# Patient Record
Sex: Male | Born: 1965 | Race: White | Hispanic: No | Marital: Married | Smoking: Current every day smoker
Health system: Southern US, Community
[De-identification: ages and names within clinical notes are randomized; demographics above are authoritative.]

## PROBLEM LIST (undated history)

## (undated) DIAGNOSIS — I10 Essential (primary) hypertension: Secondary | ICD-10-CM

## (undated) DIAGNOSIS — S37009A Unspecified injury of unspecified kidney, initial encounter: Secondary | ICD-10-CM

## (undated) DIAGNOSIS — N289 Disorder of kidney and ureter, unspecified: Secondary | ICD-10-CM

## (undated) DIAGNOSIS — F32A Depression, unspecified: Secondary | ICD-10-CM

## (undated) DIAGNOSIS — G4730 Sleep apnea, unspecified: Secondary | ICD-10-CM

## (undated) DIAGNOSIS — E669 Obesity, unspecified: Secondary | ICD-10-CM

## (undated) DIAGNOSIS — N200 Calculus of kidney: Secondary | ICD-10-CM

## (undated) DIAGNOSIS — M1990 Unspecified osteoarthritis, unspecified site: Secondary | ICD-10-CM

## (undated) DIAGNOSIS — E7800 Pure hypercholesterolemia, unspecified: Secondary | ICD-10-CM

## (undated) DIAGNOSIS — F329 Major depressive disorder, single episode, unspecified: Secondary | ICD-10-CM

## (undated) DIAGNOSIS — M109 Gout, unspecified: Secondary | ICD-10-CM

## (undated) HISTORY — DX: Sleep apnea, unspecified: G47.30

## (undated) HISTORY — DX: Depression, unspecified: F32.A

## (undated) HISTORY — DX: Essential (primary) hypertension: I10

## (undated) HISTORY — DX: Pure hypercholesterolemia, unspecified: E78.00

## (undated) HISTORY — DX: Calculus of kidney: N20.0

## (undated) HISTORY — DX: Gout, unspecified: M10.9

## (undated) HISTORY — DX: Major depressive disorder, single episode, unspecified: F32.9

---
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
## 1989-02-24 HISTORY — PX: FOOT SURGERY: SHX648

## 1997-11-08 ENCOUNTER — Emergency Department (HOSPITAL_COMMUNITY): Admission: EM | Admit: 1997-11-08 | Discharge: 1997-11-08 | Payer: Self-pay | Admitting: Emergency Medicine

## 1998-07-10 ENCOUNTER — Emergency Department (HOSPITAL_COMMUNITY): Admission: EM | Admit: 1998-07-10 | Discharge: 1998-07-10 | Payer: Self-pay | Admitting: Family Medicine

## 2000-10-14 ENCOUNTER — Ambulatory Visit (HOSPITAL_COMMUNITY): Admission: RE | Admit: 2000-10-14 | Discharge: 2000-10-14 | Payer: Self-pay | Admitting: Urology

## 2001-06-25 ENCOUNTER — Ambulatory Visit (HOSPITAL_BASED_OUTPATIENT_CLINIC_OR_DEPARTMENT_OTHER): Admission: RE | Admit: 2001-06-25 | Discharge: 2001-06-25 | Payer: Self-pay | Admitting: Family Medicine

## 2004-04-06 ENCOUNTER — Emergency Department (HOSPITAL_COMMUNITY): Admission: EM | Admit: 2004-04-06 | Discharge: 2004-04-07 | Payer: Self-pay | Admitting: *Deleted

## 2004-09-30 ENCOUNTER — Emergency Department (HOSPITAL_COMMUNITY): Admission: EM | Admit: 2004-09-30 | Discharge: 2004-10-01 | Payer: Self-pay | Admitting: Emergency Medicine

## 2005-12-04 ENCOUNTER — Emergency Department (HOSPITAL_COMMUNITY): Admission: EM | Admit: 2005-12-04 | Discharge: 2005-12-04 | Payer: Self-pay | Admitting: Emergency Medicine

## 2006-06-26 ENCOUNTER — Ambulatory Visit: Payer: Self-pay | Admitting: Internal Medicine

## 2006-06-30 ENCOUNTER — Ambulatory Visit: Payer: Self-pay | Admitting: Internal Medicine

## 2006-07-13 ENCOUNTER — Ambulatory Visit: Payer: Self-pay | Admitting: Internal Medicine

## 2006-08-21 ENCOUNTER — Ambulatory Visit: Payer: Self-pay | Admitting: Internal Medicine

## 2006-09-04 ENCOUNTER — Ambulatory Visit: Payer: Self-pay | Admitting: Internal Medicine

## 2006-09-04 LAB — CONVERTED CEMR LAB
ALT: 43 units/L (ref 0–53)
AST: 29 units/L (ref 0–37)
Albumin: 4 g/dL (ref 3.5–5.2)
Alkaline Phosphatase: 62 units/L (ref 39–117)
BUN: 12 mg/dL (ref 6–23)
Basophils Absolute: 0 10*3/uL (ref 0.0–0.1)
Basophils Relative: 0.2 % (ref 0.0–1.0)
Bilirubin Urine: NEGATIVE
Bilirubin, Direct: 0.1 mg/dL (ref 0.0–0.3)
CO2: 25 meq/L (ref 19–32)
Calcium: 9.3 mg/dL (ref 8.4–10.5)
Chloride: 102 meq/L (ref 96–112)
Cholesterol: 172 mg/dL (ref 0–200)
Creatinine, Ser: 1.4 mg/dL (ref 0.4–1.5)
Direct LDL: 129.5 mg/dL
Eosinophils Absolute: 0.6 10*3/uL (ref 0.0–0.6)
Eosinophils Relative: 7 % — ABNORMAL HIGH (ref 0.0–5.0)
GFR calc Af Amer: 71 mL/min
GFR calc non Af Amer: 59 mL/min
Glucose, Bld: 114 mg/dL — ABNORMAL HIGH (ref 70–99)
HCT: 45.2 % (ref 39.0–52.0)
HDL: 21.6 mg/dL — ABNORMAL LOW (ref >39.0)
Hemoglobin, Urine: NEGATIVE
Hemoglobin: 15.5 g/dL (ref 13.0–17.0)
Hgb A1c MFr Bld: 6.2 % — ABNORMAL HIGH (ref 4.6–6.0)
Ketones, ur: NEGATIVE mg/dL
Leukocytes, UA: NEGATIVE
Lymphocytes Relative: 27.6 % (ref 12.0–46.0)
MCHC: 34.4 g/dL (ref 30.0–36.0)
MCV: 88.8 fL (ref 78.0–100.0)
Monocytes Absolute: 0.9 10*3/uL — ABNORMAL HIGH (ref 0.2–0.7)
Monocytes Relative: 9.7 % (ref 3.0–11.0)
Neutro Abs: 5.1 10*3/uL (ref 1.4–7.7)
Neutrophils Relative %: 55.5 % (ref 43.0–77.0)
Nitrite: NEGATIVE
Platelets: 280 10*3/uL (ref 150–400)
Potassium: 3.8 meq/L (ref 3.5–5.1)
RBC: 5.09 M/uL (ref 4.22–5.81)
RDW: 11.9 % (ref 11.5–14.6)
Sodium: 137 meq/L (ref 135–145)
Specific Gravity, Urine: 1.02 (ref 1.000–1.03)
TSH: 2.34 microintl units/mL (ref 0.35–5.50)
Total Bilirubin: 0.6 mg/dL (ref 0.3–1.2)
Total CHOL/HDL Ratio: 8
Total Protein, Urine: NEGATIVE mg/dL
Total Protein: 6.8 g/dL (ref 6.0–8.3)
Triglycerides: 234 mg/dL (ref 0–149)
Urine Glucose: NEGATIVE mg/dL
Urobilinogen, UA: 0.2 (ref 0.0–1.0)
VLDL: 47 mg/dL — ABNORMAL HIGH (ref 0–40)
WBC: 9.1 10*3/uL (ref 4.5–10.5)
pH: 5.5 (ref 5.0–8.0)

## 2006-09-14 ENCOUNTER — Ambulatory Visit (HOSPITAL_BASED_OUTPATIENT_CLINIC_OR_DEPARTMENT_OTHER): Admission: RE | Admit: 2006-09-14 | Discharge: 2006-09-14 | Payer: Self-pay | Admitting: Internal Medicine

## 2006-10-04 ENCOUNTER — Emergency Department (HOSPITAL_COMMUNITY): Admission: EM | Admit: 2006-10-04 | Discharge: 2006-10-04 | Payer: Self-pay | Admitting: Emergency Medicine

## 2006-10-07 ENCOUNTER — Ambulatory Visit: Payer: Self-pay | Admitting: Pulmonary Disease

## 2006-10-16 ENCOUNTER — Ambulatory Visit: Payer: Self-pay | Admitting: Internal Medicine

## 2006-10-16 LAB — CONVERTED CEMR LAB
Bacteria, UA: NEGATIVE
Bilirubin Urine: NEGATIVE
Crystals: NEGATIVE
Hemoglobin, Urine: NEGATIVE
Ketones, ur: NEGATIVE mg/dL
Leukocytes, UA: NEGATIVE
Nitrite: NEGATIVE
RBC / HPF: NONE SEEN
Specific Gravity, Urine: 1.025 (ref 1.000–1.03)
Squamous Epithelial / LPF: NEGATIVE /lpf
Total Protein, Urine: NEGATIVE mg/dL
Urine Glucose: NEGATIVE mg/dL
Urobilinogen, UA: 0.2 (ref 0.0–1.0)
pH: 5.5 (ref 5.0–8.0)

## 2006-11-27 ENCOUNTER — Ambulatory Visit: Payer: Self-pay | Admitting: Internal Medicine

## 2006-11-27 LAB — CONVERTED CEMR LAB
BUN: 10 mg/dL (ref 6–23)
CO2: 28 meq/L (ref 19–32)
Calcium: 9.5 mg/dL (ref 8.4–10.5)
Chloride: 102 meq/L (ref 96–112)
Creatinine, Ser: 1.2 mg/dL (ref 0.4–1.5)
GFR calc Af Amer: 85 mL/min
GFR calc non Af Amer: 71 mL/min
Glucose, Bld: 106 mg/dL — ABNORMAL HIGH (ref 70–99)
Hgb A1c MFr Bld: 6.3 % — ABNORMAL HIGH (ref 4.6–6.0)
Potassium: 4 meq/L (ref 3.5–5.1)
Sodium: 137 meq/L (ref 135–145)

## 2006-12-23 DIAGNOSIS — B351 Tinea unguium: Secondary | ICD-10-CM | POA: Insufficient documentation

## 2006-12-23 DIAGNOSIS — F17200 Nicotine dependence, unspecified, uncomplicated: Secondary | ICD-10-CM | POA: Insufficient documentation

## 2006-12-23 DIAGNOSIS — Z87442 Personal history of urinary calculi: Secondary | ICD-10-CM | POA: Insufficient documentation

## 2006-12-23 DIAGNOSIS — G5600 Carpal tunnel syndrome, unspecified upper limb: Secondary | ICD-10-CM | POA: Insufficient documentation

## 2006-12-23 DIAGNOSIS — E785 Hyperlipidemia, unspecified: Secondary | ICD-10-CM | POA: Insufficient documentation

## 2006-12-23 DIAGNOSIS — I10 Essential (primary) hypertension: Secondary | ICD-10-CM | POA: Insufficient documentation

## 2006-12-23 DIAGNOSIS — F411 Generalized anxiety disorder: Secondary | ICD-10-CM | POA: Insufficient documentation

## 2007-02-18 ENCOUNTER — Emergency Department (HOSPITAL_COMMUNITY): Admission: EM | Admit: 2007-02-18 | Discharge: 2007-02-18 | Payer: Self-pay | Admitting: Emergency Medicine

## 2008-11-23 ENCOUNTER — Emergency Department (HOSPITAL_COMMUNITY): Admission: EM | Admit: 2008-11-23 | Discharge: 2008-11-23 | Payer: Self-pay | Admitting: Emergency Medicine

## 2009-03-09 ENCOUNTER — Emergency Department (HOSPITAL_COMMUNITY): Admission: EM | Admit: 2009-03-09 | Discharge: 2009-03-09 | Payer: Self-pay | Admitting: Emergency Medicine

## 2009-04-09 ENCOUNTER — Inpatient Hospital Stay (HOSPITAL_COMMUNITY): Admission: RE | Admit: 2009-04-09 | Discharge: 2009-04-11 | Payer: Self-pay | Admitting: Psychiatry

## 2009-04-09 ENCOUNTER — Ambulatory Visit: Payer: Self-pay | Admitting: Psychiatry

## 2009-04-16 ENCOUNTER — Other Ambulatory Visit (HOSPITAL_COMMUNITY): Admission: RE | Admit: 2009-04-16 | Discharge: 2009-04-27 | Payer: Self-pay | Admitting: Psychiatry

## 2009-04-16 ENCOUNTER — Ambulatory Visit: Payer: Self-pay | Admitting: Psychiatry

## 2009-05-07 ENCOUNTER — Ambulatory Visit (HOSPITAL_COMMUNITY): Payer: Self-pay | Admitting: Psychiatry

## 2009-05-14 ENCOUNTER — Ambulatory Visit (HOSPITAL_COMMUNITY): Payer: Self-pay | Admitting: Marriage and Family Therapist

## 2009-05-21 ENCOUNTER — Ambulatory Visit (HOSPITAL_COMMUNITY): Payer: Self-pay | Admitting: Psychiatry

## 2009-05-23 ENCOUNTER — Ambulatory Visit (HOSPITAL_COMMUNITY): Payer: Self-pay | Admitting: Marriage and Family Therapist

## 2009-05-29 ENCOUNTER — Ambulatory Visit (HOSPITAL_COMMUNITY): Payer: Self-pay | Admitting: Marriage and Family Therapist

## 2009-06-07 ENCOUNTER — Ambulatory Visit (HOSPITAL_COMMUNITY): Payer: Self-pay | Admitting: Marriage and Family Therapist

## 2009-06-18 ENCOUNTER — Ambulatory Visit (HOSPITAL_COMMUNITY): Payer: Self-pay | Admitting: Marriage and Family Therapist

## 2009-06-22 ENCOUNTER — Ambulatory Visit (HOSPITAL_COMMUNITY): Payer: Self-pay | Admitting: Psychiatry

## 2009-07-02 ENCOUNTER — Ambulatory Visit (HOSPITAL_COMMUNITY): Payer: Self-pay | Admitting: Marriage and Family Therapist

## 2009-07-09 ENCOUNTER — Ambulatory Visit (HOSPITAL_COMMUNITY): Payer: Self-pay | Admitting: Marriage and Family Therapist

## 2009-07-24 ENCOUNTER — Ambulatory Visit (HOSPITAL_COMMUNITY): Payer: Self-pay | Admitting: Marriage and Family Therapist

## 2009-08-09 ENCOUNTER — Ambulatory Visit (HOSPITAL_COMMUNITY): Payer: Self-pay | Admitting: Marriage and Family Therapist

## 2009-08-17 ENCOUNTER — Ambulatory Visit (HOSPITAL_COMMUNITY): Payer: Self-pay | Admitting: Psychiatry

## 2009-08-30 ENCOUNTER — Ambulatory Visit (HOSPITAL_COMMUNITY): Payer: Self-pay | Admitting: Marriage and Family Therapist

## 2009-10-12 ENCOUNTER — Ambulatory Visit (HOSPITAL_COMMUNITY): Payer: Self-pay | Admitting: Psychiatry

## 2009-12-14 ENCOUNTER — Ambulatory Visit (HOSPITAL_COMMUNITY): Payer: Self-pay | Admitting: Psychiatry

## 2010-05-11 LAB — CBC
HCT: 40.9 % (ref 39.0–52.0)
Hemoglobin: 13.9 g/dL (ref 13.0–17.0)
MCHC: 34 g/dL (ref 30.0–36.0)
MCV: 96.3 fL (ref 78.0–100.0)
Platelets: 236 10*3/uL (ref 150–400)
RBC: 4.24 MIL/uL (ref 4.22–5.81)
RDW: 14.3 % (ref 11.5–15.5)
WBC: 7.1 10*3/uL (ref 4.0–10.5)

## 2010-05-11 LAB — DIFFERENTIAL
Basophils Absolute: 0 10*3/uL (ref 0.0–0.1)
Basophils Relative: 0 % (ref 0–1)
Eosinophils Absolute: 0 10*3/uL (ref 0.0–0.7)
Eosinophils Relative: 0 % (ref 0–5)
Lymphocytes Relative: 15 % (ref 12–46)
Lymphs Abs: 1.1 10*3/uL (ref 0.7–4.0)
Monocytes Absolute: 0.4 10*3/uL (ref 0.1–1.0)
Monocytes Relative: 5 % (ref 3–12)
Neutro Abs: 5.6 10*3/uL (ref 1.7–7.7)
Neutrophils Relative %: 79 % — ABNORMAL HIGH (ref 43–77)

## 2010-05-11 LAB — BASIC METABOLIC PANEL
BUN: 10 mg/dL (ref 6–23)
CO2: 25 mEq/L (ref 19–32)
Calcium: 9.6 mg/dL (ref 8.4–10.5)
Chloride: 106 mEq/L (ref 96–112)
Creatinine, Ser: 1.26 mg/dL (ref 0.4–1.5)
GFR calc Af Amer: >60 mL/min (ref >60)
GFR calc non Af Amer: >60 mL/min (ref >60)
Glucose, Bld: 123 mg/dL — ABNORMAL HIGH (ref 70–99)
Potassium: 4.1 mEq/L (ref 3.5–5.1)
Sodium: 139 mEq/L (ref 135–145)

## 2010-05-11 LAB — CK TOTAL AND CKMB (NOT AT ARMC)
CK, MB: 1.2 ng/mL (ref 0.3–4.0)
Relative Index: 0.5 (ref 0.0–2.5)
Total CK: 243 U/L — ABNORMAL HIGH (ref 7–232)

## 2010-05-11 LAB — TROPONIN I: Troponin I: 0.02 ng/mL (ref 0.00–0.06)

## 2010-05-16 LAB — COMPREHENSIVE METABOLIC PANEL
ALT: 27 U/L (ref 0–53)
AST: 21 U/L (ref 0–37)
Albumin: 3.9 g/dL (ref 3.5–5.2)
Alkaline Phosphatase: 47 U/L (ref 39–117)
BUN: 11 mg/dL (ref 6–23)
CO2: 26 mEq/L (ref 19–32)
Calcium: 9.4 mg/dL (ref 8.4–10.5)
Chloride: 102 mEq/L (ref 96–112)
Creatinine, Ser: 1.48 mg/dL (ref 0.4–1.5)
GFR calc Af Amer: >60 mL/min (ref >60)
GFR calc non Af Amer: 51 mL/min — ABNORMAL LOW (ref >60)
Glucose, Bld: 149 mg/dL — ABNORMAL HIGH (ref 70–99)
Potassium: 3.4 mEq/L — ABNORMAL LOW (ref 3.5–5.1)
Sodium: 137 mEq/L (ref 135–145)
Total Bilirubin: 0.5 mg/dL (ref 0.3–1.2)
Total Protein: 7.2 g/dL (ref 6.0–8.3)

## 2010-05-16 LAB — URINALYSIS, MICROSCOPIC ONLY
Bilirubin Urine: NEGATIVE
Glucose, UA: NEGATIVE mg/dL
Hgb urine dipstick: NEGATIVE
Leukocytes, UA: NEGATIVE
Nitrite: NEGATIVE
Protein, ur: NEGATIVE mg/dL
Specific Gravity, Urine: 1.019 (ref 1.005–1.030)
Urobilinogen, UA: 1 mg/dL (ref 0.0–1.0)
pH: 5.5 (ref 5.0–8.0)

## 2010-05-16 LAB — DRUGS OF ABUSE SCREEN W/O ALC, ROUTINE URINE
Amphetamine Screen, Ur: NEGATIVE
Barbiturate Quant, Ur: NEGATIVE
Benzodiazepines.: NEGATIVE
Cocaine Metabolites: NEGATIVE
Creatinine,U: 266.7 mg/dL
Marijuana Metabolite: NEGATIVE
Methadone: NEGATIVE
Opiate Screen, Urine: NEGATIVE
Phencyclidine (PCP): NEGATIVE
Propoxyphene: NEGATIVE

## 2010-05-16 LAB — CBC
HCT: 42.4 % (ref 39.0–52.0)
Hemoglobin: 14.8 g/dL (ref 13.0–17.0)
MCHC: 34.9 g/dL (ref 30.0–36.0)
MCV: 95 fL (ref 78.0–100.0)
Platelets: 241 10*3/uL (ref 150–400)
RBC: 4.46 MIL/uL (ref 4.22–5.81)
RDW: 13.6 % (ref 11.5–15.5)
WBC: 11.9 10*3/uL — ABNORMAL HIGH (ref 4.0–10.5)

## 2010-05-16 LAB — TSH: TSH: 1.762 u[IU]/mL (ref 0.350–4.500)

## 2010-05-31 LAB — BASIC METABOLIC PANEL
BUN: 14 mg/dL (ref 6–23)
CO2: 28 mEq/L (ref 19–32)
Calcium: 9.3 mg/dL (ref 8.4–10.5)
Chloride: 98 mEq/L (ref 96–112)
Creatinine, Ser: 1.47 mg/dL (ref 0.4–1.5)
GFR calc Af Amer: >60 mL/min (ref >60)
GFR calc non Af Amer: 52 mL/min — ABNORMAL LOW (ref >60)
Glucose, Bld: 107 mg/dL — ABNORMAL HIGH (ref 70–99)
Potassium: 3.5 mEq/L (ref 3.5–5.1)
Sodium: 136 mEq/L (ref 135–145)

## 2010-05-31 LAB — DIFFERENTIAL
Basophils Absolute: 0 10*3/uL (ref 0.0–0.1)
Basophils Relative: 0 % (ref 0–1)
Eosinophils Absolute: 0 10*3/uL (ref 0.0–0.7)
Eosinophils Relative: 0 % (ref 0–5)
Lymphocytes Relative: 31 % (ref 12–46)
Lymphs Abs: 3.1 10*3/uL (ref 0.7–4.0)
Monocytes Absolute: 1.1 10*3/uL — ABNORMAL HIGH (ref 0.1–1.0)
Monocytes Relative: 11 % (ref 3–12)
Neutro Abs: 6 10*3/uL (ref 1.7–7.7)
Neutrophils Relative %: 58 % (ref 43–77)

## 2010-05-31 LAB — URINALYSIS, ROUTINE W REFLEX MICROSCOPIC
Bilirubin Urine: NEGATIVE
Glucose, UA: NEGATIVE mg/dL
Hgb urine dipstick: NEGATIVE
Ketones, ur: NEGATIVE mg/dL
Nitrite: NEGATIVE
Protein, ur: NEGATIVE mg/dL
Specific Gravity, Urine: 1.014 (ref 1.005–1.030)
Urobilinogen, UA: 0.2 mg/dL (ref 0.0–1.0)
pH: 5.5 (ref 5.0–8.0)

## 2010-05-31 LAB — CBC
HCT: 41 % (ref 39.0–52.0)
Hemoglobin: 14.1 g/dL (ref 13.0–17.0)
MCHC: 34.4 g/dL (ref 30.0–36.0)
MCV: 96.2 fL (ref 78.0–100.0)
Platelets: 248 10*3/uL (ref 150–400)
RBC: 4.26 MIL/uL (ref 4.22–5.81)
RDW: 14.3 % (ref 11.5–15.5)
WBC: 10.3 10*3/uL (ref 4.0–10.5)

## 2010-05-31 LAB — CK TOTAL AND CKMB (NOT AT ARMC)
CK, MB: 1.5 ng/mL (ref 0.3–4.0)
Relative Index: 0.6 (ref 0.0–2.5)
Total CK: 246 U/L — ABNORMAL HIGH (ref 7–232)

## 2010-05-31 LAB — TROPONIN I: Troponin I: 0.01 ng/mL (ref 0.00–0.06)

## 2010-07-09 NOTE — Procedures (Signed)
NAME:  James Downs, James Downs NO.:  [id number]   MEDICAL RECORD NO.:  [id number]          PATIENT TYPE:  OUT   LOCATION:  SLEEP CENTER                 FACILITY:  [facility]   PHYSICIAN:  Barbaraann Share, MD,FCCPDATE OF BIRTH:  Dec 28[birth year] 1964   DATE OF STUDY:  09/14/2006                            NOCTURNAL POLYSOMNOGRAM   REFERRING PHYSICIAN:  Barbette Hair. Artist Pais, DO   LOCATION:  Sleep lab.   INDICATION FOR STUDY:  Hypersomnia with sleep apnea.   EPWORTH SLEEPINESS SCORE:  9.   SLEEP ARCHITECTURE:  The patient had a total sleep time of 415 minutes  with very little slow wave sleep and adequate quantity of REM.  Sleep  onset latency was normal at 11 minutes, and REM onset was prolonged at  176 minutes.  Sleep efficiency was excellent at 95%.   RESPIRATORY DATA:  The patient underwent split night study where he was  found to have 138 obstructive events in the first 125 minutes of sleep.  This gave him an apnea-hypopnea index of 67 events per hour over the  first half of the night.  The events occurred in all body positions, and  there was very loud snoring noted throughout.  By protocol the patient  was then placed on a petit ComfortGel nasal CPAP mask, and ultimately  titrated to a final pressure of 13 cm water pressure with excellent  control of events and also REM rebound.   OXYGEN DATA:  There was significant O2 desaturation as low as 53% due to  the patient's obstructive events prior to CPAP initiation.  The patient  maintained excellent sats once optimal CPAP was obtained.   CARDIAC DATA:  Occasional PVC was noted, but no clinically significant  cardiac arrhythmia.   MOVEMENT-PARASOMNIA:  None.   IMPRESSIONS/RECOMMENDATIONS:  1. Split night study reveals severe obstructive sleep apnea with an      apnea-hypopnea index of 67 events per hour and O2 desaturation as      low as 53% over the first half of the night.  The patient was then      placed on a petit ComfortGel  nasal mask, and ultimately titrated to      a final pressure of 13 cm of water with excellent control of      his events.  2. Occasional PVCs noted but no clinically significant arrhythmia      during the night.      Barbaraann Share, MD,FCCP  Diplomate, American Board of Sleep  Medicine  Electronically Signed     [facility]/MEDQ  D:  09/25/2006 17:59:45  T:  09/26/2006 12:51:43  Job:  [id number]   cc:   Barbette Hair. [place], DO  [address] [place], [place] [address]

## 2010-07-12 NOTE — Assessment & Plan Note (Signed)
[facility]                           PRIMARY CARE OFFICE NOTE   HANNAN, [name]             MRN:          [id number]  DATE:06/26/2006                            DOB:          1965/09/22    HISTORY AND PHYSICAL:   CHIEF COMPLAINT:  New patient to practice.   HISTORY OF PRESENT ILLNESS:  Patient is a 45 year old white male, here  to establish primary care.  He has been followed by Dr. Merla Riches in the  past.  His main complaint today has been right-heel pain, some heel pain  on the left.  This has been ongoing for several months.  He reports pain  towards the middle/front part of his heel.  He also complains of  thickened, yellow toenails, wonders about taking antifungal medication.   He has been followed by Dr. Merla Riches for high blood pressure for two or  three years.  He is on multiple agents, including Micardis,  hydrochlorothiazide and metoprolol ER.  He ran out of medications  approximately four days ago, has not taken any of his blood pressure  medicines.  He does not report any symptoms, although he is quite  tachycardic today with a heart rate of approximately 129.   He was also noted to have elevated cholesterol readings and, in December  of 2007, was started on pravastatin 20 mg.  He denies any adverse  effects.  He is tolerating it well.  He has not had repeat lipids or  LFTs since Pravastatin initiation.   He takes Paroxetine 20 mg once a day for anxiety/depression.  He had  issues with the stress at home.  He has two teenage boys that are a  source of frustration.  He denies any history of heart disease, denies  any history of type 2 diabetes.  He also is a long-time smoker, greater  than 23 pack-years.   PAST MEDICAL HISTORY SUMMARY:  1. Hypertension.  2. Hyperlipidemia.  3. Anxiety/depression.  4. Allergic rhinitis.  5. History of kidney stones, status post extraction.  6. History of carpal tunnel syndrome,  1991.  7. Status post carpal tunnel repair, 1986.   CURRENT MEDICATIONS:  1. Allegra D once a day.  2. Micardis 80 mg once a day.  3. Rhinocort Aqua 32 micrograms one spray each nostril a day.  4. Hydrochlorothiazide 25 mg once a day.  5. Metoprolol ER 100 mg once a day.  6. Pravastatin 20 mg at bedtime.  7. Paroxetine 20 mg once a day.   ALLERGIES TO MEDICATIONS:  None known.   Patient is married.  He works for [employer] in Set [employer].   FAMILY HISTORY:  Mother is age 11, has asthma, allergic rhinitis and  hypertension.  Father is alive at age 37, has prostate cancer, skin  cancer, hypertension, and questionable remote MI and question mini-  strokes.  Denies any family history of colon cancer.   HABITS:  No alcohol.  Tobacco use as noted above.   REVIEW OF SYSTEMS:  No fevers, chills.  Has chronic nasal congestion.  Denies any history of dyspnea or chest pain.  No heartburn, nausea or  vomiting,  constipation, diarrhea.  All other systems negative.   PHYSICAL EXAM:  VITALS:  Weight is 240 pounds, temperature is 97.6,  pulse is 129, BP is 135/88 in the left arm in seated position.  IN GENERAL:  The patient is a pleasant, morbidly obese, 45 year old  white male with a somewhat disheveled appearance and poor overall  hygiene.  HEENT:  Normocephalic, atraumatic.  Pupils were equal and reactive to  light bilaterally.  Extraocular motility was intact.  Patient was  anicteric.  Conjunctiva was within normal limits.  External auditory  canal and tympanic membranes were clear bilaterally.  Oropharyngeal exam  revealed upper and lower dental plates.  There was no evidence of  leukoplakia.  NECK EXAM:  Thickened.  Probable acanthosis nigricans.  No carotid bruit  or thyromegaly.  CHEST EXAM:  Normal respiratory effort.  Chest was clear to auscultation  bilaterally.  No rhonchi, rales or wheezing.  CARDIOVASCULAR:  Regular rate and rhythm, tachycardic.  No significant   murmurs, rubs or gallops appreciated.  ABDOMEN:  Protuberant, nontender.  Positive bowel sounds, no  organomegaly.  MUSCULOSKELETAL EXAM:  No clubbing, cyanosis or edema.  Patient has  thickened bilateral toenails.  He had palpable pedis dorsalis pulses and  point tenderness on the medial anterior portion of his calcaneus.  NEUROLOGIC:  Cranial nerves were grossly intact.  He was nonfocal and  his mood and affect were appropriate.   IMPRESSION/RECOMMENDATIONS:  1. Right-heel pain, probably secondary to plantar fasciitis.  2. Onychomycosis.  3. Ongoing tobacco abuse and probable COPD.  4. Hypertension.  5. Obesity, with probable type 2 diabetes.  6. Anxiety/depression.  7. Health maintenance.   RECOMMENDATIONS:  He is to wear shoes with high arch support and I also  reviewed exercises for plantar fasciitis.  If the patient's symptoms are  persistent after one month of regular exercise, then we will consider  orthopedic referral.   I discouraged/advised against, using Lamisil at this time, especially  with the patient taking statin medications and poor success rate for  totally eradicating onychomycosis.   In terms of his tobacco abuse, he was given a prescription for Chantix  and also instructional material.  He is to discontinue within six days  of starting medication.   In terms of his hypertension, I am somewhat surprised the patient's  blood pressure is so well-controlled on no blood pressure agents.  He is  to discontinue metoprolol and we can sample the Coreg CR 20 mg once a  day.  He will follow up in two or three weeks and we will make further  adjustments based on response.  He is to keep a log of at least ten  blood pressure readings before a followup visit.   I refilled the other medications and, instead of Allegra D, patient is  to take Zyrtec over-the-counter at bedtime and also continue Rhinocort  use.     Barbette Hair. Artist Pais, DO  Electronically  Signed   RDY/MedQ  DD: 06/26/2006  DT: 06/26/2006  Job #: [id number]

## 2010-07-12 NOTE — Op Note (Signed)
[facility]  Patient:    James Downs, James Downs Visit Number: [id number] MRN: [id number]          Service Type: DSU Location: DAY Attending Physician:  Lindaann Slough Proc. Date: 10/14/00 Adm. Date:  10/14/2000                             Operative Report  PREOPERATIVE DIAGNOSIS:  Left ureteral stone.  POSTOPERATIVE DIAGNOSES:  Left ureteral stone.  Trigonal cyst.  PROCEDURE DONE:  Cystoscopy, left retrograde pyelogram, ureteroscopy with stone extraction, insertion of left double-J catheter, rupture of trigonal cyst.  SURGEON:  Lindaann Slough, M.D.  ANESTHESIA:  General.  DESCRIPTION OF PROCEDURE:  Under general anesthesia, the patient was prepped and draped and placed in the dorsal lithotomy position.  A #22 Wappler cystoscope was inserted in the bladder.  The urethra is normal.  There is no stone or tumor in the bladder.  There is a cyst in the mid trigone.  The bladder mucosa is otherwise normal.  A cone-tip catheter was passed through the cystoscope and into the left ureter orifice.  Contrast was then injected through cone-tip catheter.  There was a filling defect in the distal ureter. The proximal ureter is normal.  The cone-tip catheter was then removed.  A guidewire was passed through the cystoscope into the left ureter.  The cystoscope was removed.  A 6.5 French rigid ureteroscope was then passed in the bladder and into the distal ureter.  A stone was visualized in the distal ureter.  The stone was then extracted with a stone basket.  The ureteroscope was then reinserted in the bladder, and there was no evidence of ureteral injury.  The ureteroscope was then removed.  The guidewire was then backloaded into the cystoscope, and a #6 [place] 26 double-J catheter was passed over the guidewire with the proximal end of the double-J in the collecting system and the distal end in the bladder.  Then, the trigonal mass was biopsied with  the biopsy forceps and found to be a cyst.  No tissue was obtained for biopsy, and the mass disappeared after the biopsy.  The base of the biopsy was then fulgurated with the Bugbee electrode.  The bladder was then emptied and the cystoscope removed.  The patient tolerated the procedure well and left the OR in satisfactory condition to postanesthesia care unit. Attending Physician:  Lindaann Slough DD:  10/14/00 TD:  10/15/00 Job: [id number]

## 2010-11-29 LAB — POCT CARDIAC MARKERS
CKMB, poc: 1.4
CKMB, poc: 1.7
Myoglobin, poc: 112
Myoglobin, poc: 84.6
Operator id: 3067
Operator id: 3067
Troponin i, poc: <0.05
Troponin i, poc: <0.05

## 2010-11-29 LAB — COMPREHENSIVE METABOLIC PANEL
ALT: 38
AST: 26
Albumin: 4
Alkaline Phosphatase: 57
BUN: 9
CO2: 24
Calcium: 9.2
Chloride: 109
Creatinine, Ser: 1.32
GFR calc Af Amer: >60
GFR calc non Af Amer: 59 — ABNORMAL LOW
Glucose, Bld: 135 — ABNORMAL HIGH
Potassium: 3.9
Sodium: 140
Total Bilirubin: 0.6
Total Protein: 7.4

## 2010-11-29 LAB — CBC
HCT: 43.5
Hemoglobin: 15.2
MCHC: 34.9
MCV: 87.8
Platelets: 268
RBC: 4.96
RDW: 12.7
WBC: 8.9

## 2010-11-29 LAB — AMYLASE: Amylase: 99

## 2010-11-29 LAB — DIFFERENTIAL
Basophils Absolute: 0
Basophils Relative: 0
Eosinophils Absolute: 0
Eosinophils Relative: 0
Lymphocytes Relative: 22
Lymphs Abs: 2
Monocytes Absolute: 0.7
Monocytes Relative: 8
Neutro Abs: 6.2
Neutrophils Relative %: 70

## 2010-11-29 LAB — LIPASE, BLOOD: Lipase: 31

## 2010-12-09 LAB — URINALYSIS, ROUTINE W REFLEX MICROSCOPIC
Bilirubin Urine: NEGATIVE
Glucose, UA: NEGATIVE
Hgb urine dipstick: NEGATIVE
Ketones, ur: NEGATIVE
Nitrite: NEGATIVE
Protein, ur: NEGATIVE
Specific Gravity, Urine: 1.016
Urobilinogen, UA: 0.2
pH: 5.5

## 2010-12-09 LAB — BASIC METABOLIC PANEL
BUN: 12
CO2: 29
Calcium: 9.2
Chloride: 104
Creatinine, Ser: 1.5
GFR calc Af Amer: >60
GFR calc non Af Amer: 51 — ABNORMAL LOW
Glucose, Bld: 108 — ABNORMAL HIGH
Potassium: 3.8
Sodium: 137

## 2010-12-09 LAB — CK: Total CK: 178

## 2012-11-04 ENCOUNTER — Other Ambulatory Visit: Payer: Self-pay | Admitting: Family Medicine

## 2012-11-04 ENCOUNTER — Ambulatory Visit
Admission: RE | Admit: 2012-11-04 | Discharge: 2012-11-04 | Disposition: A | Discharge status: Home / Self Care | Payer: No Typology Code available for payment source | Source: Ambulatory Visit | Attending: Family Medicine | Admitting: Family Medicine

## 2012-11-04 DIAGNOSIS — Z021 Encounter for pre-employment examination: Secondary | ICD-10-CM

## 2013-01-13 ENCOUNTER — Encounter: Payer: Self-pay | Admitting: Pulmonary Disease

## 2013-08-24 ENCOUNTER — Other Ambulatory Visit: Payer: Self-pay | Admitting: Occupational Medicine

## 2013-08-24 ENCOUNTER — Ambulatory Visit: Payer: Self-pay

## 2013-08-24 DIAGNOSIS — Z0000 Encounter for general adult medical examination without abnormal findings: Secondary | ICD-10-CM

## 2013-11-03 ENCOUNTER — Encounter: Payer: Self-pay | Admitting: *Deleted

## 2013-11-04 ENCOUNTER — Ambulatory Visit (INDEPENDENT_AMBULATORY_CARE_PROVIDER_SITE_OTHER): Payer: BC Managed Care – PPO | Admitting: Neurology

## 2013-11-04 ENCOUNTER — Encounter: Payer: Self-pay | Admitting: Neurology

## 2013-11-04 ENCOUNTER — Encounter (INDEPENDENT_AMBULATORY_CARE_PROVIDER_SITE_OTHER): Payer: Self-pay

## 2013-11-04 VITALS — BP 98/68 | HR 104 | Ht 68.5 in | Wt 238.0 lb

## 2013-11-04 DIAGNOSIS — R202 Paresthesia of skin: Secondary | ICD-10-CM

## 2013-11-04 DIAGNOSIS — IMO0002 Reserved for concepts with insufficient information to code with codable children: Secondary | ICD-10-CM

## 2013-11-04 DIAGNOSIS — R29898 Other symptoms and signs involving the musculoskeletal system: Secondary | ICD-10-CM | POA: Insufficient documentation

## 2013-11-04 DIAGNOSIS — R29818 Other symptoms and signs involving the nervous system: Secondary | ICD-10-CM

## 2013-11-04 DIAGNOSIS — M5136 Other intervertebral disc degeneration, lumbar region: Secondary | ICD-10-CM

## 2013-11-04 DIAGNOSIS — R209 Unspecified disturbances of skin sensation: Secondary | ICD-10-CM

## 2013-11-04 DIAGNOSIS — R208 Other disturbances of skin sensation: Secondary | ICD-10-CM

## 2013-11-04 DIAGNOSIS — M5137 Other intervertebral disc degeneration, lumbosacral region: Secondary | ICD-10-CM

## 2013-11-04 DIAGNOSIS — M5416 Radiculopathy, lumbar region: Secondary | ICD-10-CM | POA: Insufficient documentation

## 2013-11-04 NOTE — Patient Instructions (Signed)
Overall you are doing fairly well but I do want to suggest a few things today:   Remember to drink plenty of fluid, eat healthy meals and do not skip any meals. Try to eat protein with a every meal and eat a healthy snack such as fruit or nuts in between meals. Try to keep a regular sleep-wake schedule and try to exercise daily, particularly in the form of walking, 20-30 minutes a day, if you can.   As far as your medications are concerned, I would like to suggest continue neurontin.   As far as diagnostic testing: MRI brain and thoracic spine, bloodwork today  I would like to see you back in 3 months, sooner if we need to. Please call us with any interim questions, concerns, problems, updates or refill requests.   Please also call us for any test results so we can go over those with you on the phone.  My clinical assistant and will answer any of your questions and relay your messages to me and also relay most of my messages to you.   Our phone number is [phone]. We also have an after hours call service for urgent matters and there is a physician on-call for urgent questions. For any emergencies you know to call 911 or go to the nearest emergency room

## 2013-11-04 NOTE — Progress Notes (Signed)
[facility]    Provider:  Dr Lucia Gaskins Referring Provider: Meda Coffee, DO Primary Care Physician:  Thayer Headings, MD  CC:  Left leg weakness  HPI:  James Downs is a 48 y.o. male here as a referral from Dr. Artist Pais for left leg weakness. Symptoms started 4 months ago. In April  Started feeling numbness in the left abdomen radiating to the back in a band-like distribution. Numbness descended down the entire left leg. Now his right big toe feels numb but unclear if this is associated. Symptoms encompass the entire area from the left side belly button down the entire leg. He has a course of oral prednisone that made it better. Also gabapentin is making it better. He has also started having pain in the back and the back of his leg, gets cramps in the calf and thigh like a severe charlie horse. Symptoms are continuous, icing helps or at least soothes him. Hot shower worsens the symptoms. He feels like he has a belt around the left side of his abdomen to the left spine. Symptoms starting to affect his job. 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 He was placed on restrictions at work. No neck pain. He walks like a "48 year old". Difficulty walking due to stiffness, numbness and instability. Pain has progressed to be 9/10 and had to stay out of work one day it is so bad. The gabapentin  tid is working. Has weakness and tingling, like muscles are tired in the whole left leg. Going up stairs is worst, doesn't know where left leg is in space. No traumas or inciting factors. No changes in bowel or bladder.   Reviewed notes, labs and imaging from outside physicians, which showed: Patient reported similar symptoms to their referring physician including pain, aching, stiffness, instability, ambulation difficulties. Per notes, MRI of the lumbar spine shows prominent multi-level degenerative changes worst at L4/L5, mild to moderate central canal stenosis and multi-level  lateral recess stenosis with multi-level encroachment on the exiting nerves. +CT of the head in 2006 and 2010 unremarkable.   Review of Systems: Patient complains of symptoms per HPI as well as the following symptoms numbness, weakness. Pertinent negatives per HPI. Otherwise out of a complete 14 system review, and all other reviewed systems are negative.   History   Social History  . Marital Status: Married    Spouse Name: N/A    Number of Children: 2  . Years of Education: N/A   Occupational History  . Not on file.   Social History Main Topics  . Smoking status: Current Every Day Smoker  . Smokeless tobacco: Never Used  . Alcohol Use: No  . Drug Use: No  . Sexual Activity: Not on file   Other Topics Concern  . Not on file   Social History Narrative   Patient is married with 2 children.   Patient is right handed.   Patient has high school education.   Patient drinks 3-4 16 oz daily.    Family History  Problem Relation Age of Onset  . Cancer Father     BRAIN/PROSTATE/SKIN  . Cancer Paternal Grandfather   . Heart failure Maternal Grandmother   . Diabetes Maternal Grandmother   . Hypertension Father   . Hypertension Maternal Grandmother   . Hypertension Maternal Grandfather     Past Medical History  Diagnosis Date  . Depression   . Gout   . Hypertension   . Hypercholesterolemia   .  Kidney stone   . Sleep apnea     Past Surgical History  Procedure Laterality Date  . Hand surgery Right 1985  . Foot surgery Right 1991    Current Outpatient Prescriptions  Medication Sig Dispense Refill  . allopurinol (ZYLOPRIM) 100 MG tablet Take 100 mg by mouth daily.      . fexofenadine (ALLERGY RELIEF) 60 MG tablet Take 60 mg by mouth 2 (two) times daily.      Marland Kitchen gabapentin (NEURONTIN) 100 MG capsule Take 100 mg by mouth 3 (three) times daily.       . naproxen sodium (ANAPROX) 220 MG tablet Take 220 mg by mouth 2 (two) times daily with a meal.      . simvastatin (ZOCOR)  40 MG tablet Take 40 mg by mouth daily.      Marland Kitchen telmisartan-hydrochlorothiazide (MICARDIS HCT) 80-25 MG per tablet Take 1 tablet by mouth daily.       No current facility-administered medications for this visit.    Allergies as of 11/04/2013  . (No Known Allergies)    Vitals: BP 98/68  Pulse 104  Ht 5' 8.5" (1.74 m)  Wt 238 lb (107.956 kg)  BMI 35.66 kg/m2 Last Weight:  Wt Readings from Last 1 Encounters:  11/04/13 238 lb (107.956 kg)   Last Height:   Ht Readings from Last 1 Encounters:  11/04/13 5' 8.5" (1.74 m)   Physical exam: Exam: Gen: NAD, conversant Eyes: anicteric sclerae, moist conjunctivae HENT: Atraumatic, oropharynx clear Neck: Trachea midline; supple,  Lungs: CTA, no wheezing, rales, rhonic                          CV: RRR, no MRG Abdomen: Soft, non-tender; obese Extremities: No peripheral edema  Skin: Normal temperature, no rash,  Psych: Appropriate affect, pleasant MSK: No tenderness to palpation thoracic spine  Neuro: Detailed Neurologic Exam  Speech:    Speech is normal; fluent and spontaneous with normal comprehension.  Cognition:    The patient is oriented to person, place, and time; memory intact; language fluent; normal attention, concentration, and fund of knowledge.  Cranial Nerves:    The pupils are equal, round, and reactive to light. The fundi are flat. Visual fields are full to finger confrontation. Extraocular movements are intact. Trigeminal sensation is intact and the muscles of mastication are normal. The face is symmetric. The palate elevates in the midline. Voice is normal. Shoulder shrug is normal. The tongue has normal motion without fasciculations.   Coordination:    Normal finger to nose and heel to shin.   Gait:    Can't heel or toe walk, difficulty with heel-to-toe, antalgic gait.  Motor Observation:    No asymmetry, no atrophy, and no involuntary movements noted.  Posture:    Posture is normal. normal erect      Strength: 3+/5 left hip 4+/5 left HS 5- left ankle flexion Otherwise 5/5  Sensation: No tenderness to palpation thoracic spine Sensory T10 level from the left spine around the left back and abdomen to just past the midline umbilicus Decreased pp and temperature in the left leg as compared right Intact vibration bilaterally in great toes Impaired proprioception left great toe Decreased pp and temperature bilaterally in the lower extremities in a gradient fashion    Reflex Exam:  DTR's:    Brisk lower extremity reflexes as compared to upper extremity reflexes Toes:    The toes are downgoing bilaterally.   Clonus:  Clonus is absent.  Assessment/Plan:  48 year old male with degenerative lumbar disk disease who is here for 4 months of progressive left leg weakness and most recently significant paresthesias and pain. He has a band of numbness from the umbilicus to the spine on the left and describes numbness from there all the way down his left leg. His neuro exam is significant for a T10 sensory level, sensory and motor long tract signs in the left leg. Is concerning for a thoracic lesion or other UMN lesion  Will order an MRI of the Thoracic cord and brain to rule out UMN lsion Exam also with bilateral decrease sensation distally in the lower extremities in a gradient fashion. Will order neuropathy screen Continue Neurontin for pain Discussed weight loss and smoking cessation Blood pressure low today with some tachycardia - may be dehydrated. Denies dizzyness, Suggest hydration.  Follow up in 3 months.  A total of 75 minutes was spent in with this patient. Over half this time was spent on counseling patient on the diagnosis and different therapeutic options available.    Naomie Dean, MD  [facility] [address] [place], [place] [address]  Phone [phone] Fax [phone]

## 2013-11-09 ENCOUNTER — Telehealth: Payer: Self-pay | Admitting: Neurology

## 2013-11-09 LAB — LYME, TOTAL AB TEST/REFLEX: Lyme IgG/IgM Ab: <0.91 {ISR} (ref 0.00–0.90)

## 2013-11-09 LAB — COMPREHENSIVE METABOLIC PANEL
ALT: 42 IU/L (ref 0–44)
AST: 36 IU/L (ref 0–40)
Albumin/Globulin Ratio: 1.8 (ref 1.1–2.5)
Albumin: 4.4 g/dL (ref 3.5–5.5)
Alkaline Phosphatase: 63 IU/L (ref 39–117)
BUN/Creatinine Ratio: 16 (ref 9–20)
BUN: 25 mg/dL — ABNORMAL HIGH (ref 6–24)
CO2: 18 mmol/L (ref 18–29)
Calcium: 9.9 mg/dL (ref 8.7–10.2)
Chloride: 96 mmol/L — ABNORMAL LOW (ref 97–108)
Creatinine, Ser: 1.57 mg/dL — ABNORMAL HIGH (ref 0.76–1.27)
GFR calc Af Amer: 59 mL/min/{1.73_m2} — ABNORMAL LOW (ref >59)
GFR calc non Af Amer: 51 mL/min/{1.73_m2} — ABNORMAL LOW (ref >59)
Globulin, Total: 2.5 g/dL (ref 1.5–4.5)
Glucose: 108 mg/dL — ABNORMAL HIGH (ref 65–99)
Potassium: 4.5 mmol/L (ref 3.5–5.2)
Sodium: 140 mmol/L (ref 134–144)
Total Bilirubin: 0.6 mg/dL (ref 0.0–1.2)
Total Protein: 6.9 g/dL (ref 6.0–8.5)

## 2013-11-09 LAB — PAN-ANCA
ANCA Proteinase 3: <3.5 U/mL (ref 0.0–3.5)
Atypical pANCA: <1:20 {titer}
C-ANCA: <1:20 {titer}
Myeloperoxidase Ab: <9 U/mL (ref 0.0–9.0)
P-ANCA: <1:20 {titer}

## 2013-11-09 LAB — IFE AND PE, SERUM
Albumin SerPl Elph-Mcnc: 3.6 g/dL (ref 3.2–5.6)
Albumin/Glob SerPl: 1.1 (ref 0.7–2.0)
Alpha 1: 0.2 g/dL (ref 0.1–0.4)
Alpha2 Glob SerPl Elph-Mcnc: 1.1 g/dL (ref 0.4–1.2)
B-Globulin SerPl Elph-Mcnc: 1 g/dL (ref 0.6–1.3)
Gamma Glob SerPl Elph-Mcnc: 0.9 g/dL (ref 0.5–1.6)
Globulin, Total: 3.3 g/dL (ref 2.0–4.5)
IgA/Immunoglobulin A, Serum: 205 mg/dL (ref 91–414)
IgG (Immunoglobin G), Serum: 1035 mg/dL (ref 700–1600)
IgM (Immunoglobulin M), Srm: 63 mg/dL (ref 40–230)

## 2013-11-09 LAB — HEMOGLOBIN A1C
Est. average glucose Bld gHb Est-mCnc: 140 mg/dL
Hgb A1c MFr Bld: 6.5 % — ABNORMAL HIGH (ref 4.8–5.6)

## 2013-11-09 LAB — HIV ANTIBODY (ROUTINE TESTING W REFLEX)
HIV 1/O/2 Abs-Index Value: <1 (ref <1.00)
HIV-1/HIV-2 Ab: NONREACTIVE

## 2013-11-09 LAB — RPR: RPR: NONREACTIVE

## 2013-11-09 LAB — VITAMIN B12: Vitamin B-12: 312 pg/mL (ref 211–946)

## 2013-11-09 LAB — SEDIMENTATION RATE: Sed Rate: 23 mm/hr — ABNORMAL HIGH (ref 0–15)

## 2013-11-09 LAB — ANA W/REFLEX: Anti Nuclear Antibody(ANA): NEGATIVE

## 2013-11-09 LAB — TSH: TSH: 4.06 u[IU]/mL (ref 0.450–4.500)

## 2013-11-09 LAB — RHEUMATOID FACTOR: Rhuematoid fact SerPl-aCnc: 7.4 IU/mL (ref 0.0–13.9)

## 2013-11-09 NOTE — Telephone Encounter (Signed)
Patient returning Dr. Trevor Mace call, please call before 2 pm due to working second shift.

## 2013-11-09 NOTE — Telephone Encounter (Signed)
Called patient to discuss labs. He is not home. Left message. Will try again tomorrow.

## 2013-11-10 ENCOUNTER — Other Ambulatory Visit: Payer: Self-pay | Admitting: Neurology

## 2013-11-10 MED ORDER — GABAPENTIN 100 MG PO CAPS
100.0000 mg | ORAL_CAPSULE | Freq: Three times a day (TID) | ORAL | Status: DC
Start: 2013-11-10 — End: 2014-11-29

## 2013-11-10 NOTE — Telephone Encounter (Signed)
Spoke with him in detail about his labs. thanks

## 2013-11-18 ENCOUNTER — Telehealth: Payer: Self-pay | Admitting: Neurology

## 2013-11-18 NOTE — Telephone Encounter (Signed)
See phone note 9/25. Patient last office visit 11/04/13 new evaluation of left leg pending results.

## 2013-11-18 NOTE — Telephone Encounter (Signed)
Patient wants to discuss being out of work and going on disability. Please call.

## 2013-11-19 ENCOUNTER — Ambulatory Visit
Admission: RE | Admit: 2013-11-19 | Discharge: 2013-11-19 | Disposition: A | Discharge status: Home / Self Care | Payer: BC Managed Care – PPO | Source: Ambulatory Visit | Attending: Neurology | Admitting: Neurology

## 2013-11-19 DIAGNOSIS — R208 Other disturbances of skin sensation: Secondary | ICD-10-CM

## 2013-11-19 DIAGNOSIS — R29818 Other symptoms and signs involving the nervous system: Secondary | ICD-10-CM

## 2013-11-19 DIAGNOSIS — R209 Unspecified disturbances of skin sensation: Secondary | ICD-10-CM

## 2013-11-19 DIAGNOSIS — R202 Paresthesia of skin: Secondary | ICD-10-CM

## 2013-11-19 MED ORDER — GADOBENATE DIMEGLUMINE 529 MG/ML IV SOLN
20.0000 mL | Freq: Once | INTRAVENOUS | Status: AC | PRN
  Administered 2013-11-19: 20 mL via INTRAVENOUS

## 2013-11-20 NOTE — Telephone Encounter (Signed)
Spoke to patient. There may be paperwork faxed to our office as he is out on temporary disability.   Cassandra - would you mind checking the offcie fax machine to see if I received any documents for this patient? thanks

## 2013-11-21 ENCOUNTER — Other Ambulatory Visit: Payer: Self-pay | Admitting: Neurology

## 2013-11-21 ENCOUNTER — Telehealth: Payer: Self-pay | Admitting: Neurology

## 2013-11-21 DIAGNOSIS — M5124 Other intervertebral disc displacement, thoracic region: Secondary | ICD-10-CM

## 2013-11-21 DIAGNOSIS — G9520 Unspecified cord compression: Secondary | ICD-10-CM

## 2013-11-22 NOTE — Telephone Encounter (Signed)
Patient notes have been faxed.  [name] appt at 2:00 pm

## 2013-11-22 NOTE — Telephone Encounter (Signed)
Checked form log. We have not received anything for patient. Called patient. He said he has to call insurance back tomorrow to discuss disability options. Explained form process and fee. Patient said once he speaks with insurance tomorrow he will know how to proceed with disability request.

## 2013-11-22 NOTE — Telephone Encounter (Signed)
Would you please send over all notes for this patient to Dr. Earl GalaNudelman's office please including demographics? He needs a [place] consult. And after that can you call Thayer Ohmhris at Dr. Earl GalaNudelman's office ([place]) and see if they were able to get patient an appointment today? Thank you!

## 2013-11-23 ENCOUNTER — Encounter (HOSPITAL_COMMUNITY): Payer: Self-pay | Admitting: Pharmacy Technician

## 2013-11-25 ENCOUNTER — Other Ambulatory Visit: Payer: Self-pay | Admitting: Neurosurgery

## 2013-11-28 ENCOUNTER — Encounter (HOSPITAL_COMMUNITY)
Admission: RE | Admit: 2013-11-28 | Discharge: 2013-11-28 | Disposition: A | Discharge status: Home / Self Care | Payer: BC Managed Care – PPO | Source: Ambulatory Visit | Attending: Neurosurgery | Admitting: Neurosurgery

## 2013-11-28 ENCOUNTER — Other Ambulatory Visit (HOSPITAL_COMMUNITY): Payer: Self-pay | Admitting: *Deleted

## 2013-11-28 ENCOUNTER — Encounter (HOSPITAL_COMMUNITY): Payer: Self-pay

## 2013-11-28 HISTORY — DX: Unspecified osteoarthritis, unspecified site: M19.90

## 2013-11-28 LAB — CBC
HCT: 46.2 % (ref 39.0–52.0)
Hemoglobin: 16.1 g/dL (ref 13.0–17.0)
MCH: 29.9 pg (ref 26.0–34.0)
MCHC: 34.8 g/dL (ref 30.0–36.0)
MCV: 85.9 fL (ref 78.0–100.0)
Platelets: 218 10*3/uL (ref 150–400)
RBC: 5.38 MIL/uL (ref 4.22–5.81)
RDW: 13.6 % (ref 11.5–15.5)
WBC: 8.8 10*3/uL (ref 4.0–10.5)

## 2013-11-28 LAB — SURGICAL PCR SCREEN
MRSA, PCR: NEGATIVE
Staphylococcus aureus: NEGATIVE

## 2013-11-28 LAB — BASIC METABOLIC PANEL
Anion gap: 13 (ref 5–15)
BUN: 14 mg/dL (ref 6–23)
CO2: 24 mEq/L (ref 19–32)
Calcium: 9.7 mg/dL (ref 8.4–10.5)
Chloride: 103 mEq/L (ref 96–112)
Creatinine, Ser: 1.36 mg/dL — ABNORMAL HIGH (ref 0.50–1.35)
GFR calc Af Amer: 69 mL/min — ABNORMAL LOW (ref >90)
GFR calc non Af Amer: 60 mL/min — ABNORMAL LOW (ref >90)
Glucose, Bld: 100 mg/dL — ABNORMAL HIGH (ref 70–99)
Potassium: 4.2 mEq/L (ref 3.7–5.3)
Sodium: 140 mEq/L (ref 137–147)

## 2013-11-28 NOTE — Addendum Note (Signed)
Addended by: Winifred OliveRABTREE, TERESA W on: 11/28/2013 07:39 AM   Modules accepted: Orders

## 2013-11-28 NOTE — Pre-Procedure Instructions (Signed)
James Downs  11/28/2013   Your procedure is scheduled on:  Tuesday, November 29, 2013 at 12:45 PM.   [facility] Admitting Office at 10:45 AM.   Call this number if you have problems the morning of surgery: [phone]   Remember:   Do not eat food or drink liquids after midnight tonight.   Take these medicines the morning of surgery with A SIP OF WATER: gabapentin (NEURONTIN), HYDROcodone-acetaminophen (NORCO/VICODIN) - if needed.    Do not wear jewelry.  Do not wear lotions, powders, or cologne. You may wear deodorant.  Men may shave face and neck.  Do not bring valuables to the hospital.  [facility]                  for any belongings or valuables.               Contacts, dentures or bridgework may not be worn into surgery.  Leave suitcase in the car. After surgery it may be brought to your room.  For patients admitted to the hospital, discharge time is determined by your                treatment team.              Special Instructions:  - Preparing for Surgery  Before surgery, you can play an important role.  Because skin is not sterile, your skin needs to be as free of germs as possible.  You can reduce the number of germs on you skin by washing with CHG (chlorahexidine gluconate) soap before surgery.  CHG is an antiseptic cleaner which kills germs and bonds with the skin to continue killing germs even after washing.  Please DO NOT use if you have an allergy to CHG or antibacterial soaps.  If your skin becomes reddened/irritated stop using the CHG and inform your nurse when you arrive at Short Stay.  Do not shave (including legs and underarms) for at least 48 hours prior to the first CHG shower.  You may shave your face.  Please follow these instructions carefully:   1.  Shower with CHG Soap the night before surgery and the                                morning of Surgery.  2.  If you choose to wash your  hair, wash your hair first as usual with your       normal shampoo.  3.  After you shampoo, rinse your hair and body thoroughly to remove the                      Shampoo.  4.  Use CHG as you would any other liquid soap.  You can apply chg directly       to the skin and wash gently with scrungie or a clean washcloth.  5.  Apply the CHG Soap to your body ONLY FROM THE NECK DOWN.        Do not use on open wounds or open sores.  Avoid contact with your eyes, ears, mouth and genitals (private parts).  Wash genitals (private parts) with your normal soap.  6.  Wash thoroughly, paying special attention to the area where your surgery        will be performed.  7.  Thoroughly rinse your body  with warm water from the neck down.  8.  DO NOT shower/wash with your normal soap after using and rinsing off       the CHG Soap.  9.  Pat yourself dry with a clean towel.            10.  Wear clean pajamas.            11.  Place clean sheets on your bed the night of your first shower and do not        sleep with pets.  Day of Surgery  Do not apply any lotions the morning of surgery.  Please wear clean clothes to the hospital/surgery center.     Please read over the following fact sheets that you were given: Pain Booklet, Coughing and Deep Breathing, MRSA Information and Surgical Site Infection Prevention

## 2013-11-28 NOTE — Pre-Procedure Instructions (Deleted)
James Downs  11/28/2013   Your procedure is scheduled on:  Tuesday, November 29, 2013 at 12:45 PM.   Report to [facility] Entrance "A" Admitting Office at  AM.   Call this number if you have problems the morning of surgery: [phone]   Remember:   Do not eat food or drink liquids after midnight tonight.   Take these medicines the morning of surgery with A SIP OF WATER: gabapentin (NEURONTIN), HYDROcodone-acetaminophen (NORCO/VICODIN) - if needed.    Do not wear jewelry.  Do not wear lotions, powders, or cologne. You may wear deodorant.  Men may shave face and neck.  Do not bring valuables to the hospital.  [facility] is not responsible                  for any belongings or valuables.               Contacts, dentures or bridgework may not be worn into surgery.  Leave suitcase in the car. After surgery it may be brought to your room.  For patients admitted to the hospital, discharge time is determined by your                treatment team.              Special Instructions: [place] - Preparing for Surgery  Before surgery, you can play an important role.  Because skin is not sterile, your skin needs to be as free of germs as possible.  You can reduce the number of germs on you skin by washing with CHG (chlorahexidine gluconate) soap before surgery.  CHG is an antiseptic cleaner which kills germs and bonds with the skin to continue killing germs even after washing.  Please DO NOT use if you have an allergy to CHG or antibacterial soaps.  If your skin becomes reddened/irritated stop using the CHG and inform your nurse when you arrive at Short Stay.  Do not shave (including legs and underarms) for at least 48 hours prior to the first CHG shower.  You may shave your face.  Please follow these instructions carefully:   1.  Shower with CHG Soap the night before surgery and the                                morning of Surgery.  2.  If you choose to wash your hair,  wash your hair first as usual with your       normal shampoo.  3.  After you shampoo, rinse your hair and body thoroughly to remove the                      Shampoo.  4.  Use CHG as you would any other liquid soap.  You can apply chg directly       to the skin and wash gently with scrungie or a clean washcloth.  5.  Apply the CHG Soap to your body ONLY FROM THE NECK DOWN.        Do not use on open wounds or open sores.  Avoid contact with your eyes, ears, mouth and genitals (private parts).  Wash genitals (private parts) with your normal soap.  6.  Wash thoroughly, paying special attention to the area where your surgery        will be performed.  7.  Thoroughly rinse your body  with warm water from the neck down.  8.  DO NOT shower/wash with your normal soap after using and rinsing off       the CHG Soap.  9.  Pat yourself dry with a clean towel.            10.  Wear clean pajamas.            11.  Place clean sheets on your bed the night of your first shower and do not        sleep with pets.  Day of Surgery  Do not apply any lotions the morning of surgery.  Please wear clean clothes to the hospital/surgery center.     Please read over the following fact sheets that you were given: Pain Booklet, Coughing and Deep Breathing, MRSA Information and Surgical Site Infection Prevention

## 2013-11-29 ENCOUNTER — Inpatient Hospital Stay (HOSPITAL_COMMUNITY): Payer: BC Managed Care – PPO

## 2013-11-29 ENCOUNTER — Encounter (HOSPITAL_COMMUNITY): Payer: BC Managed Care – PPO | Admitting: Anesthesiology

## 2013-11-29 ENCOUNTER — Encounter (HOSPITAL_COMMUNITY): Payer: Self-pay | Admitting: *Deleted

## 2013-11-29 ENCOUNTER — Inpatient Hospital Stay (HOSPITAL_COMMUNITY): Payer: BC Managed Care – PPO | Admitting: Anesthesiology

## 2013-11-29 ENCOUNTER — Inpatient Hospital Stay (HOSPITAL_COMMUNITY)
Admission: RE | Admit: 2013-11-29 | Discharge: 2013-12-04 | DRG: 519 | Disposition: A | Discharge status: Home / Self Care | Payer: BC Managed Care – PPO | Source: Ambulatory Visit | Attending: Neurosurgery | Admitting: Neurosurgery

## 2013-11-29 ENCOUNTER — Encounter (HOSPITAL_COMMUNITY)
Admission: RE | Disposition: A | Discharge status: Home / Self Care | Payer: Self-pay | Source: Ambulatory Visit | Attending: Neurosurgery

## 2013-11-29 DIAGNOSIS — Z01812 Encounter for preprocedural laboratory examination: Secondary | ICD-10-CM | POA: Diagnosis not present

## 2013-11-29 DIAGNOSIS — F17210 Nicotine dependence, cigarettes, uncomplicated: Secondary | ICD-10-CM | POA: Diagnosis present

## 2013-11-29 DIAGNOSIS — I10 Essential (primary) hypertension: Secondary | ICD-10-CM | POA: Diagnosis present

## 2013-11-29 DIAGNOSIS — G4730 Sleep apnea, unspecified: Secondary | ICD-10-CM | POA: Diagnosis present

## 2013-11-29 DIAGNOSIS — Z01810 Encounter for preprocedural cardiovascular examination: Secondary | ICD-10-CM | POA: Diagnosis not present

## 2013-11-29 DIAGNOSIS — Y92234 Operating room of hospital as the place of occurrence of the external cause: Secondary | ICD-10-CM | POA: Diagnosis not present

## 2013-11-29 DIAGNOSIS — M5104 Intervertebral disc disorders with myelopathy, thoracic region: Secondary | ICD-10-CM | POA: Diagnosis present

## 2013-11-29 DIAGNOSIS — E780 Pure hypercholesterolemia: Secondary | ICD-10-CM | POA: Diagnosis present

## 2013-11-29 DIAGNOSIS — Y658 Other specified misadventures during surgical and medical care: Secondary | ICD-10-CM | POA: Diagnosis not present

## 2013-11-29 DIAGNOSIS — J9811 Atelectasis: Secondary | ICD-10-CM | POA: Diagnosis not present

## 2013-11-29 DIAGNOSIS — G9741 Accidental puncture or laceration of dura during a procedure: Secondary | ICD-10-CM | POA: Diagnosis not present

## 2013-11-29 DIAGNOSIS — R200 Anesthesia of skin: Secondary | ICD-10-CM | POA: Diagnosis present

## 2013-11-29 DIAGNOSIS — Z79899 Other long term (current) drug therapy: Secondary | ICD-10-CM

## 2013-11-29 DIAGNOSIS — M109 Gout, unspecified: Secondary | ICD-10-CM | POA: Diagnosis present

## 2013-11-29 HISTORY — PX: POSTERIOR LUMBAR FUSION 4 LEVEL: SHX6037

## 2013-11-29 LAB — CBC
HCT: 36.6 % — ABNORMAL LOW (ref 39.0–52.0)
Hemoglobin: 12.7 g/dL — ABNORMAL LOW (ref 13.0–17.0)
MCH: 29.7 pg (ref 26.0–34.0)
MCHC: 34.7 g/dL (ref 30.0–36.0)
MCV: 85.7 fL (ref 78.0–100.0)
Platelets: 184 10*3/uL (ref 150–400)
RBC: 4.27 MIL/uL (ref 4.22–5.81)
RDW: 13.6 % (ref 11.5–15.5)
WBC: 8.5 10*3/uL (ref 4.0–10.5)

## 2013-11-29 LAB — TYPE AND SCREEN
ABO/RH(D): B POS
Antibody Screen: NEGATIVE

## 2013-11-29 LAB — ABO/RH: ABO/RH(D): B POS

## 2013-11-29 LAB — CREATININE, SERUM
Creatinine, Ser: 1.22 mg/dL (ref 0.50–1.35)
GFR calc Af Amer: 79 mL/min — ABNORMAL LOW (ref >90)
GFR calc non Af Amer: 68 mL/min — ABNORMAL LOW (ref >90)

## 2013-11-29 SURGERY — POSTERIOR LUMBAR FUSION 4 LEVEL
Anesthesia: General

## 2013-11-29 MED ORDER — ONDANSETRON HCL 4 MG/2ML IJ SOLN
INTRAMUSCULAR | Status: DC | PRN
  Administered 2013-11-29: 4 mg via INTRAVENOUS

## 2013-11-29 MED ORDER — BUPIVACAINE LIPOSOME 1.3 % IJ SUSP
20.0000 mL | INTRAMUSCULAR | Status: DC
  Filled 2013-11-29: qty 20

## 2013-11-29 MED ORDER — SUFENTANIL CITRATE 50 MCG/ML IV SOLN
INTRAVENOUS | Status: AC
  Filled 2013-11-29: qty 1

## 2013-11-29 MED ORDER — ARTIFICIAL TEARS OP OINT
TOPICAL_OINTMENT | OPHTHALMIC | Status: DC | PRN
  Administered 2013-11-29: 1 via OPHTHALMIC

## 2013-11-29 MED ORDER — SODIUM CHLORIDE 0.9 % IV SOLN
250.0000 mL | INTRAVENOUS | Status: DC
  Administered 2013-12-03: 250 mL via INTRAVENOUS

## 2013-11-29 MED ORDER — CEFAZOLIN SODIUM-DEXTROSE 2-3 GM-% IV SOLR
INTRAVENOUS | Status: AC
  Filled 2013-11-29: qty 50

## 2013-11-29 MED ORDER — CEFAZOLIN SODIUM-DEXTROSE 2-3 GM-% IV SOLR
INTRAVENOUS | Status: AC
  Administered 2013-11-29 (×2): 2 g via INTRAVENOUS
  Filled 2013-11-29: qty 50

## 2013-11-29 MED ORDER — DOCUSATE SODIUM 100 MG PO CAPS
100.0000 mg | ORAL_CAPSULE | Freq: Two times a day (BID) | ORAL | Status: DC
  Administered 2013-11-29 – 2013-12-04 (×10): 100 mg via ORAL
  Filled 2013-11-29 (×10): qty 1

## 2013-11-29 MED ORDER — DIPHENHYDRAMINE HCL 50 MG/ML IJ SOLN: 12.5000 mg | Freq: Four times a day (QID) | INTRAMUSCULAR | Status: DC | PRN

## 2013-11-29 MED ORDER — LABETALOL HCL 5 MG/ML IV SOLN
INTRAVENOUS | Status: AC
  Filled 2013-11-29: qty 4

## 2013-11-29 MED ORDER — CEFAZOLIN SODIUM 1-5 GM-% IV SOLN
1.0000 g | Freq: Three times a day (TID) | INTRAVENOUS | Status: AC
  Administered 2013-11-29 – 2013-11-30 (×2): 1 g via INTRAVENOUS
  Filled 2013-11-29 (×2): qty 50

## 2013-11-29 MED ORDER — SODIUM CHLORIDE 0.9 % IJ SOLN
INTRAMUSCULAR | Status: AC
  Filled 2013-11-29: qty 10

## 2013-11-29 MED ORDER — TELMISARTAN-HCTZ 80-25 MG PO TABS: 1.0000 | ORAL_TABLET | Freq: Every day | ORAL | Status: DC

## 2013-11-29 MED ORDER — ALLOPURINOL 100 MG PO TABS
100.0000 mg | ORAL_TABLET | Freq: Every day | ORAL | Status: DC
  Administered 2013-11-29 – 2013-12-04 (×6): 100 mg via ORAL
  Filled 2013-11-29 (×6): qty 1

## 2013-11-29 MED ORDER — LIDOCAINE HCL (CARDIAC) 20 MG/ML IV SOLN
INTRAVENOUS | Status: AC
  Filled 2013-11-29: qty 5

## 2013-11-29 MED ORDER — LORATADINE 10 MG PO TABS
10.0000 mg | ORAL_TABLET | Freq: Every day | ORAL | Status: DC
  Administered 2013-11-29 – 2013-12-04 (×6): 10 mg via ORAL
  Filled 2013-11-29 (×6): qty 1

## 2013-11-29 MED ORDER — 0.9 % SODIUM CHLORIDE (POUR BTL) OPTIME
TOPICAL | Status: DC | PRN
  Administered 2013-11-29: 1000 mL

## 2013-11-29 MED ORDER — ACETAMINOPHEN 650 MG RE SUPP: 650.0000 mg | RECTAL | Status: DC | PRN

## 2013-11-29 MED ORDER — SODIUM CHLORIDE 0.9 % IJ SOLN
3.0000 mL | Freq: Two times a day (BID) | INTRAMUSCULAR | Status: DC
  Administered 2013-12-01: 3 mL via INTRAVENOUS

## 2013-11-29 MED ORDER — MORPHINE SULFATE (PF) 1 MG/ML IV SOLN
INTRAVENOUS | Status: DC
  Administered 2013-11-29: 21:00:00 via INTRAVENOUS
  Administered 2013-11-30: 10.91 mg via INTRAVENOUS
  Administered 2013-11-30: 8 mg via INTRAVENOUS
  Administered 2013-11-30 (×2): 5 mg via INTRAVENOUS
  Administered 2013-12-01: 08:00:00 via INTRAVENOUS
  Administered 2013-12-01: 3 mg via INTRAVENOUS
  Administered 2013-12-01: 4 mg via INTRAVENOUS
  Administered 2013-12-01: 9 mg via INTRAVENOUS
  Administered 2013-12-01: 1.57 mg via INTRAVENOUS
  Filled 2013-11-29 (×2): qty 25

## 2013-11-29 MED ORDER — SODIUM CHLORIDE 0.9 % IJ SOLN: 9.0000 mL | INTRAMUSCULAR | Status: DC | PRN

## 2013-11-29 MED ORDER — ROCURONIUM BROMIDE 50 MG/5ML IV SOLN
INTRAVENOUS | Status: AC
  Filled 2013-11-29: qty 2

## 2013-11-29 MED ORDER — GLYCOPYRROLATE 0.2 MG/ML IJ SOLN
INTRAMUSCULAR | Status: AC
  Filled 2013-11-29: qty 3

## 2013-11-29 MED ORDER — SODIUM CHLORIDE 0.9 % IV SOLN
INTRAVENOUS | Status: DC
  Administered 2013-11-29: 75 mL via INTRAVENOUS
  Administered 2013-11-30 – 2013-12-02 (×3): via INTRAVENOUS

## 2013-11-29 MED ORDER — DIAZEPAM 5 MG PO TABS
5.0000 mg | ORAL_TABLET | Freq: Four times a day (QID) | ORAL | Status: DC | PRN
Start: 2013-11-29 — End: 2013-12-04
  Administered 2013-11-30 – 2013-12-04 (×7): 5 mg via ORAL
  Filled 2013-11-29 (×7): qty 1

## 2013-11-29 MED ORDER — SODIUM CHLORIDE 0.9 % IR SOLN
Status: DC | PRN
  Administered 2013-11-29: 15:00:00

## 2013-11-29 MED ORDER — THROMBIN 20000 UNITS EX SOLR
CUTANEOUS | Status: DC | PRN
  Administered 2013-11-29: 15:00:00 via TOPICAL

## 2013-11-29 MED ORDER — DIPHENHYDRAMINE HCL 12.5 MG/5ML PO ELIX: 12.5000 mg | ORAL_SOLUTION | Freq: Four times a day (QID) | ORAL | Status: DC | PRN

## 2013-11-29 MED ORDER — GLYCOPYRROLATE 0.2 MG/ML IJ SOLN
INTRAMUSCULAR | Status: DC | PRN
  Administered 2013-11-29: 0.6 mg via INTRAVENOUS

## 2013-11-29 MED ORDER — LACTATED RINGERS IV SOLN
INTRAVENOUS | Status: DC
  Administered 2013-11-29: 13:00:00 via INTRAVENOUS

## 2013-11-29 MED ORDER — ONDANSETRON HCL 4 MG/2ML IJ SOLN
4.0000 mg | Freq: Four times a day (QID) | INTRAMUSCULAR | Status: DC | PRN
Start: 2013-11-29 — End: 2013-12-01

## 2013-11-29 MED ORDER — MIDAZOLAM HCL 5 MG/5ML IJ SOLN
INTRAMUSCULAR | Status: DC | PRN
  Administered 2013-11-29: 2 mg via INTRAVENOUS

## 2013-11-29 MED ORDER — PROPOFOL 10 MG/ML IV BOLUS
INTRAVENOUS | Status: AC
  Filled 2013-11-29: qty 20

## 2013-11-29 MED ORDER — HYDROCHLOROTHIAZIDE 25 MG PO TABS
25.0000 mg | ORAL_TABLET | Freq: Every day | ORAL | Status: DC
  Administered 2013-11-30 – 2013-12-04 (×5): 25 mg via ORAL
  Filled 2013-11-29 (×5): qty 1

## 2013-11-29 MED ORDER — PHENYLEPHRINE HCL 10 MG/ML IJ SOLN
10.0000 mg | INTRAVENOUS | Status: DC | PRN
  Administered 2013-11-29 (×2): 40 ug/min via INTRAVENOUS

## 2013-11-29 MED ORDER — HEPARIN SODIUM (PORCINE) 5000 UNIT/ML IJ SOLN
5000.0000 [IU] | Freq: Three times a day (TID) | INTRAMUSCULAR | Status: DC
  Administered 2013-11-30 – 2013-12-04 (×13): 5000 [IU] via SUBCUTANEOUS
  Filled 2013-11-29 (×14): qty 1

## 2013-11-29 MED ORDER — DEXAMETHASONE SODIUM PHOSPHATE 10 MG/ML IJ SOLN
INTRAMUSCULAR | Status: DC | PRN
  Administered 2013-11-29: 10 mg via INTRAVENOUS

## 2013-11-29 MED ORDER — SUFENTANIL CITRATE 50 MCG/ML IV SOLN
INTRAVENOUS | Status: DC | PRN
  Administered 2013-11-29 (×2): 5 ug via INTRAVENOUS
  Administered 2013-11-29 (×2): 10 ug via INTRAVENOUS
  Administered 2013-11-29: 5 ug via INTRAVENOUS
  Administered 2013-11-29 (×2): 10 ug via INTRAVENOUS
  Administered 2013-11-29 (×2): 5 ug via INTRAVENOUS
  Administered 2013-11-29: 15 ug via INTRAVENOUS
  Administered 2013-11-29: 10 ug via INTRAVENOUS
  Administered 2013-11-29: 15 ug via INTRAVENOUS
  Administered 2013-11-29: 20 ug via INTRAVENOUS
  Administered 2013-11-29: 10 ug via INTRAVENOUS

## 2013-11-29 MED ORDER — PHENOL 1.4 % MT LIQD: 1.0000 | OROMUCOSAL | Status: DC | PRN

## 2013-11-29 MED ORDER — VANCOMYCIN HCL 1000 MG IV SOLR
INTRAVENOUS | Status: AC
  Filled 2013-11-29: qty 1000

## 2013-11-29 MED ORDER — MIDAZOLAM HCL 2 MG/2ML IJ SOLN
INTRAMUSCULAR | Status: AC
  Filled 2013-11-29: qty 2

## 2013-11-29 MED ORDER — ALBUMIN HUMAN 5 % IV SOLN
INTRAVENOUS | Status: DC | PRN
  Administered 2013-11-29 (×2): via INTRAVENOUS

## 2013-11-29 MED ORDER — ONDANSETRON HCL 4 MG/2ML IJ SOLN
4.0000 mg | INTRAMUSCULAR | Status: DC | PRN
  Administered 2013-12-03: 4 mg via INTRAVENOUS
  Filled 2013-11-29: qty 2

## 2013-11-29 MED ORDER — ROCURONIUM BROMIDE 100 MG/10ML IV SOLN
INTRAVENOUS | Status: DC | PRN
  Administered 2013-11-29 (×2): 10 mg via INTRAVENOUS
  Administered 2013-11-29: 20 mg via INTRAVENOUS
  Administered 2013-11-29 (×2): 10 mg via INTRAVENOUS
  Administered 2013-11-29 (×2): 20 mg via INTRAVENOUS
  Administered 2013-11-29: 10 mg via INTRAVENOUS
  Administered 2013-11-29: 50 mg via INTRAVENOUS

## 2013-11-29 MED ORDER — LIDOCAINE HCL (CARDIAC) 20 MG/ML IV SOLN
INTRAVENOUS | Status: DC | PRN
  Administered 2013-11-29: 80 mg via INTRAVENOUS

## 2013-11-29 MED ORDER — LACTATED RINGERS IV SOLN
INTRAVENOUS | Status: DC | PRN
  Administered 2013-11-29 (×2): via INTRAVENOUS

## 2013-11-29 MED ORDER — SODIUM CHLORIDE 0.9 % IJ SOLN: 3.0000 mL | INTRAMUSCULAR | Status: DC | PRN

## 2013-11-29 MED ORDER — SIMVASTATIN 40 MG PO TABS
40.0000 mg | ORAL_TABLET | Freq: Every day | ORAL | Status: DC
  Administered 2013-11-29 – 2013-12-04 (×6): 40 mg via ORAL
  Filled 2013-11-29 (×6): qty 1

## 2013-11-29 MED ORDER — PROPOFOL 10 MG/ML IV BOLUS
INTRAVENOUS | Status: DC | PRN
  Administered 2013-11-29: 200 mg via INTRAVENOUS

## 2013-11-29 MED ORDER — ACETAMINOPHEN 325 MG PO TABS
650.0000 mg | ORAL_TABLET | ORAL | Status: DC | PRN
Start: 2013-11-29 — End: 2013-12-04

## 2013-11-29 MED ORDER — NEOSTIGMINE METHYLSULFATE 10 MG/10ML IV SOLN
INTRAVENOUS | Status: DC | PRN
  Administered 2013-11-29: 5 mg via INTRAVENOUS

## 2013-11-29 MED ORDER — THROMBIN 5000 UNITS EX SOLR
OROMUCOSAL | Status: DC | PRN
  Administered 2013-11-29 (×2): via TOPICAL

## 2013-11-29 MED ORDER — BUPIVACAINE LIPOSOME 1.3 % IJ SUSP
INTRAMUSCULAR | Status: DC | PRN
  Administered 2013-11-29: 20 mL

## 2013-11-29 MED ORDER — NEOSTIGMINE METHYLSULFATE 10 MG/10ML IV SOLN
INTRAVENOUS | Status: AC
  Filled 2013-11-29: qty 1

## 2013-11-29 MED ORDER — IRBESARTAN 300 MG PO TABS
300.0000 mg | ORAL_TABLET | Freq: Every day | ORAL | Status: DC
  Administered 2013-11-30 – 2013-12-04 (×5): 300 mg via ORAL
  Filled 2013-11-29 (×5): qty 1

## 2013-11-29 MED ORDER — MENTHOL 3 MG MT LOZG
1.0000 | LOZENGE | OROMUCOSAL | Status: DC | PRN
Start: 2013-11-29 — End: 2013-12-04

## 2013-11-29 MED ORDER — SENNA 8.6 MG PO TABS
1.0000 | ORAL_TABLET | Freq: Two times a day (BID) | ORAL | Status: DC
  Administered 2013-11-29 – 2013-12-04 (×10): 8.6 mg via ORAL
  Filled 2013-11-29 (×10): qty 1

## 2013-11-29 MED ORDER — MORPHINE SULFATE (PF) 1 MG/ML IV SOLN
INTRAVENOUS | Status: AC
  Filled 2013-11-29: qty 25

## 2013-11-29 MED ORDER — NALOXONE HCL 0.4 MG/ML IJ SOLN: 0.4000 mg | INTRAMUSCULAR | Status: DC | PRN

## 2013-11-29 MED ORDER — GABAPENTIN 100 MG PO CAPS
100.0000 mg | ORAL_CAPSULE | Freq: Three times a day (TID) | ORAL | Status: DC
  Administered 2013-11-29 – 2013-12-04 (×14): 100 mg via ORAL
  Filled 2013-11-29 (×14): qty 1

## 2013-11-29 SURGICAL SUPPLY — 79 items
ADH SKN CLS APL DERMABOND .7 (GAUZE/BANDAGES/DRESSINGS) ×1
APL SKNCLS STERI-STRIP NONHPOA (GAUZE/BANDAGES/DRESSINGS)
APL SRG 60D 8 XTD TIP BNDBL (TIP) ×1
BAG DECANTER FOR FLEXI CONT (MISCELLANEOUS) ×2 IMPLANT
BENZOIN TINCTURE PRP APPL 2/3 (GAUZE/BANDAGES/DRESSINGS) IMPLANT
BLADE CLIPPER SURG (BLADE) ×1 IMPLANT
BLADE SURG 11 STRL SS (BLADE) ×2 IMPLANT
BUR MATCHSTICK NEURO 3.0 LAGG (BURR) ×2 IMPLANT
CANISTER SUCT 3000ML (MISCELLANEOUS) ×2 IMPLANT
CATH FOLEY 2WAY SLVR  5CC 14FR (CATHETERS)
CATH FOLEY 2WAY SLVR 5CC 14FR (CATHETERS) ×1 IMPLANT
CONT SPEC 4OZ CLIKSEAL STRL BL (MISCELLANEOUS) ×3 IMPLANT
COVER BACK TABLE 24X17X13 BIG (DRAPES) IMPLANT
COVER TABLE BACK 60X90 (DRAPES) ×2 IMPLANT
DECANTER SPIKE VIAL GLASS SM (MISCELLANEOUS) ×2 IMPLANT
DERMABOND ADVANCED (GAUZE/BANDAGES/DRESSINGS) ×1
DERMABOND ADVANCED .7 DNX12 (GAUZE/BANDAGES/DRESSINGS) IMPLANT
DRAPE C-ARM 42X72 X-RAY (DRAPES) ×4 IMPLANT
DRAPE C-ARMOR (DRAPES) ×2 IMPLANT
DRAPE LAPAROTOMY 100X72X124 (DRAPES) ×2 IMPLANT
DRAPE MICROSCOPE LEICA (MISCELLANEOUS) ×3 IMPLANT
DRAPE POUCH INSTRU U-SHP 10X18 (DRAPES) ×2 IMPLANT
DRAPE SURG 17X23 STRL (DRAPES) ×2 IMPLANT
DRSG OPSITE POSTOP 4X10 (GAUZE/BANDAGES/DRESSINGS) ×2 IMPLANT
DURAPREP 26ML APPLICATOR (WOUND CARE) ×3 IMPLANT
DURASEAL APPLICATOR TIP (TIP) ×1 IMPLANT
DURASEAL SPINE SEALANT 3ML (MISCELLANEOUS) ×1 IMPLANT
ELECT REM PT RETURN 9FT ADLT (ELECTROSURGICAL) ×2
ELECTRODE REM PT RTRN 9FT ADLT (ELECTROSURGICAL) ×1 IMPLANT
GAUZE SPONGE 4X4 12PLY STRL (GAUZE/BANDAGES/DRESSINGS) IMPLANT
GAUZE SPONGE 4X4 16PLY XRAY LF (GAUZE/BANDAGES/DRESSINGS) IMPLANT
GLOVE BIO SURGEON STRL SZ 6.5 (GLOVE) ×4 IMPLANT
GLOVE BIOGEL PI IND STRL 7.0 (GLOVE) IMPLANT
GLOVE BIOGEL PI IND STRL 7.5 (GLOVE) ×1 IMPLANT
GLOVE BIOGEL PI IND STRL 8.5 (GLOVE) IMPLANT
GLOVE BIOGEL PI INDICATOR 7.0 (GLOVE) ×3
GLOVE BIOGEL PI INDICATOR 7.5 (GLOVE) ×1
GLOVE BIOGEL PI INDICATOR 8.5 (GLOVE) ×1
GLOVE ECLIPSE 7.0 STRL STRAW (GLOVE) ×2 IMPLANT
GLOVE EXAM NITRILE LRG STRL (GLOVE) IMPLANT
GLOVE EXAM NITRILE MD LF STRL (GLOVE) ×1 IMPLANT
GLOVE EXAM NITRILE XL STR (GLOVE) IMPLANT
GLOVE EXAM NITRILE XS STR PU (GLOVE) IMPLANT
GLOVE SS BIOGEL STRL SZ 6.5 (GLOVE) IMPLANT
GLOVE SUPERSENSE BIOGEL SZ 6.5 (GLOVE) ×2
GOWN STRL REUS W/ TWL LRG LVL3 (GOWN DISPOSABLE) ×2 IMPLANT
GOWN STRL REUS W/ TWL XL LVL3 (GOWN DISPOSABLE) IMPLANT
GOWN STRL REUS W/TWL 2XL LVL3 (GOWN DISPOSABLE) IMPLANT
GOWN STRL REUS W/TWL LRG LVL3 (GOWN DISPOSABLE) ×4
GOWN STRL REUS W/TWL XL LVL3 (GOWN DISPOSABLE) ×2
GRAFT DURAGEN MATRIX 1WX1L (Tissue) ×1 IMPLANT
HEMOSTAT POWDER KIT SURGIFOAM (HEMOSTASIS) ×3 IMPLANT
KIT BASIN OR (CUSTOM PROCEDURE TRAY) ×2 IMPLANT
KIT ROOM TURNOVER OR (KITS) ×2 IMPLANT
NDL HYPO 18GX1.5 BLUNT FILL (NEEDLE) IMPLANT
NDL HYPO 25X1 1.5 SAFETY (NEEDLE) ×1 IMPLANT
NDL SPNL 18GX3.5 QUINCKE PK (NEEDLE) IMPLANT
NEEDLE HYPO 18GX1.5 BLUNT FILL (NEEDLE) IMPLANT
NEEDLE HYPO 25X1 1.5 SAFETY (NEEDLE) ×2 IMPLANT
NEEDLE SPNL 18GX3.5 QUINCKE PK (NEEDLE) ×4 IMPLANT
NS IRRIG 1000ML POUR BTL (IV SOLUTION) ×2 IMPLANT
PACK LAMINECTOMY NEURO (CUSTOM PROCEDURE TRAY) ×2 IMPLANT
PAD ARMBOARD 7.5X6 YLW CONV (MISCELLANEOUS) ×6 IMPLANT
PATTIES SURGICAL .5 X3 (DISPOSABLE) ×1 IMPLANT
RUBBERBAND STERILE (MISCELLANEOUS) ×6 IMPLANT
SPONGE LAP 4X18 X RAY DECT (DISPOSABLE) IMPLANT
SPONGE SURGIFOAM ABS GEL 100 (HEMOSTASIS) ×2 IMPLANT
STRIP CLOSURE SKIN 1/2X4 (GAUZE/BANDAGES/DRESSINGS) IMPLANT
SUT VIC AB 0 CT1 18XCR BRD8 (SUTURE) ×2 IMPLANT
SUT VIC AB 0 CT1 8-18 (SUTURE) ×6
SUT VIC AB 2-0 CT1 18 (SUTURE) IMPLANT
SUT VICRYL 3-0 RB1 18 ABS (SUTURE) ×4 IMPLANT
SYR 20CC LL (SYRINGE) ×2 IMPLANT
SYR 20ML ECCENTRIC (SYRINGE) ×2 IMPLANT
SYR 3ML LL SCALE MARK (SYRINGE) IMPLANT
TOWEL OR 17X24 6PK STRL BLUE (TOWEL DISPOSABLE) ×2 IMPLANT
TOWEL OR 17X26 10 PK STRL BLUE (TOWEL DISPOSABLE) ×2 IMPLANT
TRAP SPECIMEN MUCOUS 40CC (MISCELLANEOUS) ×2 IMPLANT
WATER STERILE IRR 1000ML POUR (IV SOLUTION) ×2 IMPLANT

## 2013-11-29 NOTE — Anesthesia Postprocedure Evaluation (Signed)
  Anesthesia Post-op Note  Patient: James Downs  Procedure(s) Performed: Procedure(s) with comments: Thoracic Eight, Thoracic Nine, Costotransversectomy   Thoracic Seven-Eight Thoracic Eight-Nine diskectomy (N/A) - Thoracic Eight, Thoracic Nine, Costotransversectomy   Thoracic Seven-Eight Thoracic Eight-Nine diskectomy  Patient Location: PACU  Anesthesia Type:General  Level of Consciousness: awake, alert , oriented and patient cooperative  Airway and Oxygen Therapy: Patient Spontanous Breathing and Patient connected to nasal cannula oxygen  Post-op Pain: mild  Post-op Assessment: Post-op Vital signs reviewed, [name] Cardiovascular Status Stable, Respiratory Function Stable, Patent Airway, No signs of Nausea or vomiting and Pain level controlled  Post-op Vital Signs: Reviewed and stable  Last Vitals:  Filed Vitals:   11/29/13 2045  BP: 104/54  Pulse: 103  Temp:   Resp: 21    Complications: No apparent anesthesia complications

## 2013-11-29 NOTE — Progress Notes (Signed)
Patient arrived to 4N room 19. Patient transferred to bed and assessed. VSS. Patient oriented to room and unit. Call bell within reach. Will continue to monitor patient. Eliezer ChampagneShakenna Bass,RN

## 2013-11-29 NOTE — Progress Notes (Signed)
Notified Courtney in CT about order CT Thoracic Spine and that patient has not arrived @ this time. Will notify when patient arrive.

## 2013-11-29 NOTE — Anesthesia Preprocedure Evaluation (Addendum)
Anesthesia Evaluation  Patient identified by MRN, date of birth, ID band Patient awake    Reviewed: Allergy & Precautions, H&P , NPO status , [name] Chart, lab work & pertinent test results  History of Anesthesia Complications Negative for: history of anesthetic complications  Airway       Dental   Pulmonary sleep apnea , Current Smoker,  breath sounds clear to auscultation        Cardiovascular hypertension, Rhythm:Regular Rate:Normal     Neuro/Psych Depression  Neuromuscular disease    GI/Hepatic   Endo/Other    Renal/GU Renal InsufficiencyRenal disease     Musculoskeletal  (+) Arthritis -,   Abdominal   Peds  Hematology   Anesthesia Other Findings   Reproductive/Obstetrics                          Anesthesia Physical Anesthesia Plan  ASA: III  Anesthesia Plan: General   Post-op Pain Management:    Induction: Intravenous  Airway Management Planned: Oral ETT  Additional Equipment: Arterial line  Intra-op Plan:   Post-operative Plan: Extubation in OR  Informed Consent:   Dental advisory given  Plan Discussed with: CRNA and Surgeon  Anesthesia Plan Comments:        Anesthesia Quick Evaluation

## 2013-11-29 NOTE — Op Note (Signed)
PREOP DIAGNOSIS:  1. 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 Left T7 laminotomy 2. Left T8 costotransversectomy for T7-8 discectomy with decompression of spinal cord 3. Left T9 costotransversectomy for T8-9 discectomy with decompression of spinal cord 4. Use of intraoperative microscope for microdissection  SURGEON: Dr. Lisbeth Renshaw, MD  ASSISTANT: Dr. Maeola Harman, MD  ANESTHESIA: General Endotracheal  EBL: 500cc  SPECIMENS: None  DRAINS: None  COMPLICATIONS: None immediate  CONDITION: Hemodynamically stable to PACU  HISTORY: James Downs is a 48 y.o. male who was initially seen in the outpatient neurosurgery clinic with signs consistent with thoracic myelopathy. He had previously seen neurology, and MRI of the thoracic spine was obtained which demonstrated very large T7-8 and T8-9 herniated discs with severe cord compression. With these findings, surgical decompression was indicated. The risks and benefits of the surgery were explained in detail to the patient and his mother. After all their questions were answered, informed consent was obtained.  PROCEDURE IN DETAIL: After informed consent was obtained and witnessed, the patient was brought to the operating room. After induction of general anesthesia, the patient was positioned on the operative table in the prone position. All pressure points were meticulously padded. Skin incision was then marked out and prepped and draped in the usual sterile fashion.  After timeout was conducted, 2 finder needles were introduced percutaneously at the approximate T11 and T7 levels. AP fluoroscopy was then used to identify the 12th rib, and the T7-8 and T8-9 interspaces were identified in their surface projection. Skin incision was then made sharply, Bovie electrocautery was used to dissect the subcutaneous tissue until the thoracodorsal fascia was  identified. This was then incised on the left side, and subperiosteal dissection along the T7, T8, T9, and the superior portion of T10 lamina. The dissection was carried out laterally to the edge of the transverse process, and a approximately 2 cm segment of the T8 and T9 ribs were dissected. Self-retaining retractor was then placed. Repeat intraoperative fluoroscopy was used to confirm location at the correct levels. At this point, the high-speed drill was used to complete a left-sided laminotomy at T9, T8, T7, and the superior portion of T10. At this point, the microscope was draped sterilely and brought into the field, and the remainder of the case was done under the microscope using microdissection.  After completing the laminotomy, the exiting T8 and T9 nerve roots were identified. The pars interarticularis at T8 and T9 were then divided, and the inferior articulating processes of both levels were removed. Using a high-speed drill, as well as the rongeurs, the transverse process at T8 and T9 were removed. The pedicle was then drilled down. The superior articulating process of T8 and T9 were then disconnected and removed. During the pediculectomy at T9, a very small incidental durotomy was created in the axilla of the exiting left T9 nerve root. This was covered with a piece of Gelfoam.  After resection of the transverse process and the pedicle at T8 and T9, attention was then turned to resection of the proximal T9 rib. Initially, the T9 rib was disconnected using a high-speed drill approximately 2 cm distal to the transverse process. The underlying tissue was then dissected free, and a portion of the rib was removed using Kerrison rongeurs as well as a high-speed drill. This was traced medially until its articulation with the lateral portion of the T8 and T9 vertebral body was identified.  Ball tip dissectors were then used to easily identify a fairly large disc herniation at T8-9. Using a combination of  curettes and angled dissectors, the disc space was entered, and small disc fragments were removed. Finally, a ball tip dissector was placed in the ventral epidural space, and a fairly large free fragment was removed. Good decompression of the thecal sac was confirmed with a long ball to dissectors.  After the T8-9 discectomy and decompression, attention was then turned to the T8 rib for T7-8 decompression. In a similar fashion, the left T8 rib was cut, and it was removed using a combination of rongeurs and a high-speed drill. Its articulation with the lateral aspect of the T7 and T8 vertebral bodies was identified. The T7-8 disc space was then identified and incised. A fairly large disc herniation was again easily identified with a ball to dissectors. In a similar fashion, using a combination of curettes and dissectors, the disc herniation was removed piecemeal. Good decompression of the ventral thecal sac was confirmed using a ball to dissectors.  At this point, having completed the decompression, a small piece of DuraGen was placed in the axilla of the left T9 nerve root. This was covered with a thin layer of DuraSeal.  The wound was then irrigated with copious amounts of antibiotic saline. Hemostasis was achieved using a combination of bipolar electrocautery, as well as morcellized Gelfoam and thrombin. The wound is then closed in layers using interrupted 0 Vicryl stitches in the fascia, 0 Vicryl stitches in the deep subcutaneous layer, interrupted 3-0 Vicryl stitches in the subcuticular layer, and the skin was closed with Dermabond. Sterile dressing was then placed.  At the end of the case, all sponge, needle, instrument, and cottonoid counts were correct. The patient was then transferred to the stretcher, extubated, and taken to the postanesthesia care unit in stable hemodynamic condition.

## 2013-11-29 NOTE — Progress Notes (Signed)
CT notified that pt. Arrived , states that order placed incorrectly and must be re-entered. Order re entered and CT notified 2nd time that pt waiting for CT

## 2013-11-29 NOTE — H&P (Signed)
CC:  No chief complaint on file.   HPI: James Downs is a 48 year old man seen for initial consultation in the office last week.  He is referred from his neurologist after being seen and evaluated, and found to have fairly significant myelopathy, with thoracic disc herniation. The patient states he initially began to feel symptoms in April of this year, approximately 5 months ago. He initially felt numbness on the left side of his abdomen around the belly button. Soon after, he began to experience numbness which traveled down his left leg. He felt like his leg was swollen and very heavy. Because of this, he began noticing difficulty walking, with significant imbalance. He also noted a feeling of tightness wrapping around his left knee and his left side of his abdomen, and now has been experiencing a similar symptom around his right knee. He does not have any difficulty with urination, neither inset sensation of his bladder being full, nor difficulty emptying his bladder all the way. When asked about pain, he says that he has begun to experience a burning type pain in his left leg, as well as burning pain in his back traveling all the way up to his neck.   PMH: Past Medical History  Diagnosis Date  . Depression   . Gout   . Hypercholesterolemia   . Kidney stone   . Hypertension     on meds  . Sleep apnea     uses CPAP  . Arthritis     PSH: Past Surgical History  Procedure Laterality Date  . Hand surgery Right 1985  . Foot surgery Right 1991    SH: History  Substance Use Topics  . Smoking status: Current Every Day Smoker -- 1.00 packs/day for 30 years    Types: Cigarettes  . Smokeless tobacco: Former NeurosurgeonUser    Types: Snuff, Chew  . Alcohol Use: No    MEDS: Prior to Admission medications   Medication Sig Start Date End Date Taking? Authorizing Provider  allopurinol (ZYLOPRIM) 100 MG tablet Take 100 mg by mouth daily.   Yes Historical Provider, MD  fexofenadine-pseudoephedrine  (ALLEGRA-D 24) 180-240 MG per 24 hr tablet Take 1 tablet by mouth daily.   Yes Historical Provider, MD  gabapentin (NEURONTIN) 100 MG capsule Take 1 capsule (100 mg total) by mouth 3 (three) times daily. 11/10/13  Yes Anson FretAntonia B Ahern, MD  HYDROcodone-acetaminophen (NORCO/VICODIN) 5-325 MG per tablet Take 1-2 tablets by mouth every 4 (four) hours as needed for moderate pain.   Yes Historical Provider, MD  naproxen sodium (ANAPROX) 220 MG tablet Take 440 mg by mouth 2 (two) times daily with a meal.    Yes Historical Provider, MD  simvastatin (ZOCOR) 40 MG tablet Take 40 mg by mouth daily.   Yes Historical Provider, MD  telmisartan-hydrochlorothiazide (MICARDIS HCT) 80-25 MG per tablet Take 1 tablet by mouth daily.   Yes Historical Provider, MD    ALLERGY: No Known Allergies  ROS: Review of Systems  Constitutional: Negative for fever and chills.  Eyes: Negative for blurred vision.  Respiratory: Negative for cough.   Cardiovascular: Positive for leg swelling. Negative for chest pain.  Gastrointestinal: Negative for heartburn, nausea and vomiting.  Genitourinary: Negative for dysuria and urgency.  Musculoskeletal: Positive for back pain. Negative for falls and myalgias.  Skin: Negative for itching and rash.  Neurological: Positive for sensory change and focal weakness. Negative for dizziness, loss of consciousness and headaches.  Endo/Heme/Allergies: Does not bruise/bleed easily.  Psychiatric/Behavioral: Negative for  depression.    NEUROLOGIC EXAM: Awake, alert, oriented Memory and concentration grossly intact Speech fluent, appropriate CN grossly intact Motor exam: Upper Extremities Deltoid Bicep Tricep Grip  Right 5/5 5/5 5/5 5/5  Left 5/5 5/5 5/5 5/5   Lower Extremity IP Quad PF DF EHL  Right 5/5 5/5 5/5 5/5 5/5  Left 4/5 4-/5 4+/5 4/5 4/5   Sensation grossly intact to LT  IMGAING: MRI of the thoracic spine was reviewed. This demonstrates a fairly large central disc herniation  at T7 T8 with severe spinal cord compression. Similarly, at T8 T9 there was fairly large central disc herniation with severe stenosis and spinal cord compression. There does appear to be subtle T2 signal change within the spinal cord itself at this level.  CT thoracic spine was reviewed which does not demonstrate any significant calcification of the T7-8 or T8-9 disc herniation  IMPRESSION: 48 year old man with thoracic myelopathy, and large T7 T8 and T8 T9 disc herniations causing severe cord compression.  PLAN: - Left T8, T9 costotransversectomy for discectomy/decompression of spinal cord. Possible T6-T11 stabilization  I did review the MRI findings with the patient and his mother who is a retired Engineer, civil (consulting). Specifically, I show them the severe spinal cord compression due to the 2 thoracic disc herniations. With this finding, surgical decompression is indicated. The risks of the surgery were reviewed in detail, including the risk of worsening spinal cord injury leading to worsening weakness, numbness, or paralysis, as well as bladder dysfunction. We also discussed the risk of CSF leak, bleeding, infection, and postoperative deformity. We also discussed the goals of surgery, primarily being halting progression of his myelopathy. I did tell them that while there is a good chance that he may regain function, this could not be guaranteed. The patient and his mother understood our discussion and asked appropriate questions. They're willing to proceed with surgical decompression.

## 2013-11-29 NOTE — Transfer of Care (Signed)
Immediate Anesthesia Transfer of Care Note  Patient: James ParesRichard M Riles  Procedure(s) Performed: Procedure(s) with comments: Thoracic Eight, Thoracic Nine, Costotransversectomy   Thoracic Seven-Eight Thoracic Eight-Nine diskectomy (N/A) - Thoracic Eight, Thoracic Nine, Costotransversectomy   Thoracic Seven-Eight Thoracic Eight-Nine diskectomy  Patient Location: PACU  Anesthesia Type:General  Level of Consciousness: awake, alert  and patient cooperative  Airway & Oxygen Therapy: Patient Spontanous Breathing and Patient connected to face mask oxygen  Post-op Assessment: Report given to PACU RN and Post -op Vital signs reviewed and stable  Post vital signs: Reviewed and stable  Complications: No apparent anesthesia complications

## 2013-11-29 NOTE — Anesthesia Procedure Notes (Signed)
Procedure Name: Intubation Date/Time: 11/29/2013 1:48 PM Performed by: Coralee RudFLORES, ROBERT Pre-anesthesia Checklist: Patient identified, Emergency Drugs available and Suction available Patient Re-evaluated:Patient Re-evaluated prior to inductionOxygen Delivery Method: Circle system utilized Preoxygenation: Pre-oxygenation with 100% oxygen Intubation Type: IV induction Laryngoscope Size: Miller and 3 Grade View: Grade I Tube type: Oral Tube size: 8.0 mm Number of attempts: 1 Airway Equipment and Method: Stylet Placement Confirmation: ETT inserted through vocal cords under direct vision,  positive ETCO2 and breath sounds checked- equal and bilateral

## 2013-11-30 LAB — POCT I-STAT 7, (LYTES, BLD GAS, ICA,H+H)
Bicarbonate: 25.2 mEq/L — ABNORMAL HIGH (ref 20.0–24.0)
Calcium, Ion: 1.23 mmol/L (ref 1.12–1.23)
HCT: 36 % — ABNORMAL LOW (ref 39.0–52.0)
Hemoglobin: 12.2 g/dL — ABNORMAL LOW (ref 13.0–17.0)
O2 Saturation: 100 %
Patient temperature: 37.2
Potassium: 4.4 mEq/L (ref 3.7–5.3)
Sodium: 138 mEq/L (ref 137–147)
TCO2: 27 mmol/L (ref 0–100)
pCO2 arterial: 44 mmHg (ref 35.0–45.0)
pH, Arterial: 7.367 (ref 7.350–7.450)
pO2, Arterial: 457 mmHg — ABNORMAL HIGH (ref 80.0–100.0)

## 2013-11-30 LAB — POCT I-STAT 4, (NA,K, GLUC, HGB,HCT)
Glucose, Bld: 131 mg/dL — ABNORMAL HIGH (ref 70–99)
HCT: 37 % — ABNORMAL LOW (ref 39.0–52.0)
Hemoglobin: 12.6 g/dL — ABNORMAL LOW (ref 13.0–17.0)
Potassium: 4.4 mEq/L (ref 3.7–5.3)
Sodium: 139 mEq/L (ref 137–147)

## 2013-11-30 LAB — GLUCOSE, CAPILLARY: Glucose-Capillary: 124 mg/dL — ABNORMAL HIGH (ref 70–99)

## 2013-11-30 NOTE — Evaluation (Signed)
Physical Therapy Evaluation Patient Details Name: James Downs MRN: [id number] DOB: Oct 04, 1965 Today's Date: 11/30/2013   History of Present Illness  48 y.o. male admitted to [facility] on 11/29/13 s/p L T7 laminectomy, L T8 and T9 costotransversectomy, and T7/8, T8/9 discectomy and decompression of spinal cord.  Pt with significant PMHx of depression, gout, HTN, R hand surgery and R foot surgery.    Clinical Impression  Pt is POD #1 s/p thoracic spine surgery.  Pt continues to have weakness and decreased sensation in his left leg.  He is mobilizing well with a RW and has access to equipment at d/c.  Pt would likely benefit from OP PT when physician approves for progressive gait training (going from RW to cane).    Follow Up Recommendations Outpatient PT (for progressive gait training)    Equipment Recommendations  None recommended by PT (pt can borrow all PT equipment. )    Recommendations for Other Services   NA     Precautions / Restrictions Precautions Precautions: Fall Precaution Comments: due to left leg weakness and decreased sensation Required Braces or Orthoses:  (none ordered or in room)      Mobility  Bed Mobility Overal bed mobility: Needs Assistance Bed Mobility: Rolling;Sidelying to Sit;Sit to Sidelying Rolling: Min assist Sidelying to sit: Min assist       General bed mobility comments: Min assist to support trunk during transitions.  Verbal cues for log roll technique and hand placement.  Pt using railing for leverage.   Transfers Overall transfer level: Needs assistance Equipment used: Rolling walker (2 wheeled) Transfers: Sit to/from Stand Sit to Stand: Min assist         General transfer comment: Min assist to support trunk during transitions.    Ambulation/Gait Ambulation/Gait assistance: Min assist Ambulation Distance (Feet): 10 Feet Assistive device: Rolling walker (2 wheeled) Gait Pattern/deviations: Step-through pattern;Decreased stance time -  left;Decreased weight shift to left Gait velocity: decreased Gait velocity interpretation: Below normal speed for age/gender General Gait Details: Pt with decreased sensation left foot which is resulting in some lack of coordination for placement of left foot while he is stepping.          Balance Overall balance assessment: Needs assistance Sitting-balance support: Feet supported;No upper extremity supported Sitting balance-Leahy Scale: Good     Standing balance support: Bilateral upper extremity supported Standing balance-Leahy Scale: Poor Standing balance comment: needs external support today to stand                             Pertinent Vitals/Pain Pain Assessment: 0-10 Pain Score: 8  Pain Location: incisional Pain Descriptors / Indicators: Aching;Burning Pain Intervention(s): Limited activity within patient's tolerance;Monitored during session;Repositioned;PCA encouraged    Home Living Family/patient expects to be discharged to:: Private residence Living Arrangements: Spouse/significant other;Children (wife and youngest son 88 y.o, daughter in law and granddaugh) Available Help at Discharge: Family;Available 24 hours/day Type of Home: House Home Access: Level entry     Home Layout: One level Home Equipment: Walker - 4 wheels;Bedside commode;Cane - single point Additional Comments: wears glasses bifocals (wears them all the time)    Prior Function Level of Independence: Independent         Comments: still driving PTA, was working up until ~10 days ago, 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   Cervical /  Trunk Assessment: Normal  Communication   Communication: No difficulties  Cognition Arousal/Alertness: Awake/alert Behavior During Therapy: WFL for tasks assessed/performed Overall Cognitive Status: Within Functional Limits for tasks assessed                               Assessment/Plan    PT Assessment Patient needs continued PT services  PT Diagnosis Difficulty walking;Generalized weakness;Abnormality of gait;Acute pain   PT Problem List Decreased strength;Decreased activity tolerance;Decreased balance;Decreased mobility;Decreased knowledge of use of DME;Decreased knowledge of precautions;Impaired sensation;Pain  PT Treatment Interventions Gait training;DME instruction;Functional mobility training;Balance training;Therapeutic activities;Therapeutic exercise;Patient/family education;Neuromuscular re-education;Modalities   PT Goals (Current goals can be found in the Care Plan section) Acute Rehab PT Goals Patient Stated Goal: to get his left leg back PT Goal Formulation: With patient Time For Goal Achievement: 12/07/13 Potential to Achieve Goals: Good    Frequency Min 5X/week    End of Session   Activity Tolerance: Patient limited by pain Patient left: in chair;with call bell/phone within reach;with family/visitor present Nurse Communication: Mobility status         Time: 4782-95621016-1045 PT Time Calculation (min): 29 min   Charges:   PT Evaluation $Initial PT Evaluation Tier I: 1 Procedure PT Treatments $Therapeutic Activity: 8-22 mins        Rebecca B. Medendorp, PT, DPT [phone]   11/30/2013, 10:55 AM

## 2013-11-30 NOTE — Progress Notes (Signed)
Placed patient on CPAP for the night at home settings of 13cm. Oxygen set at 2lpm with SP02=95%

## 2013-11-30 NOTE — Progress Notes (Signed)
No issues overnight. Pt has pain reasonably well controlled with morphine PCA. Reports his legs feel about the same as preop, maybe slightly better.  EXAM:  BP 105/66  Pulse 91  Temp(Src) 98.6 F (37 C) (Oral)  Resp 18  Wt 105.235 kg (232 lb)  SpO2 95%  Awake, alert, oriented  Speech fluent, appropriate  CN grossly intact  5/5 RLE 4+/5 proximal LLE, 4+/5 distal LLE Wound c/d/i. No leak  IMPRESSION:  48 y.o. male POD#1 T8-9 costotransversectomy, neurologically at baseline  PLAN: - Mobilize with PT  - HOB at 30 degrees  - Cont morphine PCA for today, will transfer to PO meds tomorrow

## 2013-11-30 NOTE — Evaluation (Signed)
Occupational Therapy Evaluation Patient Details Name: James Downs MRN: [id number] DOB: May 09, 1965 Today's Date: 11/30/2013    History of Present Illness 48 y.o. male admitted to [facility] on 11/29/13 s/p L T7 laminectomy, L T8 and T9 costotransversectomy, and T7/8, T8/9 discectomy and decompression of spinal cord.  Pt with significant PMHx of depression, gout, HTN, R hand surgery and R foot surgery.     Clinical Impression   Patient is s/p L-t laminectomy surgery resulting in functional limitations due to the deficits listed below (see OT problem list). PTA independent and working.  Patient will benefit from skilled OT acutely to increase independence and safety with ADLS to allow discharge home without follow up. OT to follow acutely for adl retraining with back precautions. Recommend RW     Follow Up Recommendations  No OT follow up    Equipment Recommendations  Other (comment) (RW)    Recommendations for Other Services       Precautions / Restrictions Precautions Precautions: Fall Precaution Comments: due to left leg weakness and decreased sensation Required Braces or Orthoses:  (none ordered or in room)      Mobility Bed Mobility Overal bed mobility: Needs Assistance Bed Mobility: Rolling;Sidelying to Sit;Sit to Sidelying Rolling: Min assist Sidelying to sit: Min assist       General bed mobility comments: in chair on arrival  Transfers Overall transfer level: Needs assistance Equipment used: Rolling walker (2 wheeled) Transfers: Sit to/from Stand Sit to Stand: Min guard         General transfer comment: cues for hand placement    Balance Overall balance assessment: Needs assistance Sitting-balance support: Feet supported;No upper extremity supported Sitting balance-Leahy Scale: Good     Standing balance support: Bilateral upper extremity supported;During functional activity Standing balance-Leahy Scale: Fair Standing balance comment: needs external  support today to stand                            ADL Overall ADL's : Needs assistance/impaired Eating/Feeding: Modified independent                   Lower Body Dressing: Minimal assistance;Sit to/from stand Lower Body Dressing Details (indicate cue type and reason): able to cross R LE and almost able to reach L LE. Educated on dress LLE first. Does not require AE at this time Toilet Transfer: Min Arboriculturist Details (indicate cue type and reason): declined simulated with chair         Functional mobility during ADLs: Min guard;Rolling walker General ADL Comments: Educated on adls with back preacautions. Mother who is retired Charity fundraiser present and verbalized she will purchase AE in gft shop Lexic[employer]). Pt with hand present and highlighted important areas     Vision                     Perception     Praxis      Pertinent Vitals/Pain Pain Assessment: 0-10 Pain Score: 7  Pain Location: incision  Pain Descriptors / Indicators: Constant Pain Intervention(s): PCA encouraged;Repositioned     Hand Dominance Right   Extremity/Trunk Assessment Upper Extremity Assessment Upper Extremity Assessment: Overall WFL for tasks assessed   Lower Extremity Assessment Lower Extremity Assessment: Defer to PT evaluation LLE Deficits / Details: pt with 3+/5 strength in left leg with seated MMT.  LLE Sensation: decreased light touch LLE Coordination: decreased gross motor;decreased fine motor (likely due to decrease  sensation)   Cervical / Trunk Assessment Cervical / Trunk Assessment: Normal   Communication Communication Communication: No difficulties   Cognition Arousal/Alertness: Awake/alert Behavior During Therapy: WFL for tasks assessed/performed Overall Cognitive Status: Within Functional Limits for tasks assessed                     General Comments       Exercises       Shoulder Instructions      Home Living  Family/patient expects to be discharged to:: Private residence Living Arrangements: Spouse/significant other;Children (wife and youngest son 49 y.o, daughter in law and granddaugh) Available Help at Discharge: Family;Available 24 hours/day Type of Home: House Home Access: Level entry     Home Layout: One level     Bathroom Shower/Tub: Chief Strategy OfficerTub/shower unit   Bathroom Toilet: Handicapped height     Home Equipment: Environmental consultantWalker - 4 wheels;Bedside commode;Cane - single point (has cane that mother says can  use)   Additional Comments: wears glasses bifocals (wears them all the time)      Prior Functioning/Environment Level of Independence: Independent        Comments: still driving PTA, was working up until ~10 days ago, 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, pulling, pushing, going up an ddown stairs) , 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 lift ~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 at work    OT Diagnosis: Generalized weakness;Acute pain   OT Problem List: Decreased strength;Decreased activity tolerance;Impaired balance (sitting and/or standing);Decreased safety awareness;Decreased knowledge of use of DME or AE;Decreased knowledge of precautions;Pain;Obesity   OT Treatment/Interventions: Self-care/ADL training;Therapeutic exercise;DME and/or AE instruction;Therapeutic activities;Patient/family education;Balance training    OT Goals(Current goals can be found in the care plan section) Acute Rehab OT Goals Patient Stated Goal: none stated OT Goal Formulation: With patient Time For Goal Achievement: 12/14/13 Potential to Achieve Goals: Good  OT Frequency: Min 2X/week   Barriers to D/C:            Co-evaluation              End of Session Equipment Utilized During Treatment: Gait belt;Rolling walker Nurse Communication: Mobility status;Precautions  Activity Tolerance: Patient tolerated treatment well Patient left: in chair;with call bell/phone within reach;with family/visitor present   Time: [id number] OT Time Calculation (min):  19 min Charges:  OT General Charges $OT Visit: 1 Procedure OT Evaluation $Initial OT Evaluation Tier I: 1 Procedure OT Treatments $Self Care/Home Management : 8-22 mins G-Codes:    Harolyn RutherfordJones, Jessica B 11/30/2013, 1:37 PM Pager: [phone]

## 2013-12-01 ENCOUNTER — Encounter (HOSPITAL_COMMUNITY): Payer: Self-pay | Admitting: Neurosurgery

## 2013-12-01 MED ORDER — MORPHINE SULFATE 2 MG/ML IJ SOLN
2.0000 mg | INTRAMUSCULAR | Status: DC | PRN
  Administered 2013-12-01 – 2013-12-04 (×7): 2 mg via INTRAVENOUS
  Filled 2013-12-01 (×7): qty 1

## 2013-12-01 MED ORDER — OXYCODONE-ACETAMINOPHEN 5-325 MG PO TABS
2.0000 | ORAL_TABLET | ORAL | Status: DC | PRN
  Administered 2013-12-01 – 2013-12-04 (×12): 2 via ORAL
  Filled 2013-12-01 (×13): qty 2

## 2013-12-01 NOTE — Progress Notes (Signed)
Charges 2 self care  Wife present and no further questions at this time  I agree with the following treatment note after reviewing documentation.   Mateo FlowJones, Brynn OTR/L Pager: [phone] Office: [phone] .

## 2013-12-01 NOTE — Progress Notes (Signed)
No issues overnight. Pt reports decreased appetite today, and pain is worse because he says his pump ran out this morning. Legs feel about the same, able to walk with rolling walker with PT today.  EXAM:  BP 106/71  Pulse 114  Temp(Src) 98 F (36.7 C) (Oral)  Resp 21  Ht 5\' 7"  (1.702 m)  Wt 105.235 kg (232 lb)  BMI 36.33 kg/m2  SpO2 97%  Awake, alert, oriented  Speech fluent, appropriate  CN grossly intact  4+/5 LLE, 5/5 RLE Wound c/d/i, no leak  IMPRESSION:  48 y.o. 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

## 2013-12-01 NOTE — Progress Notes (Signed)
Occupational Therapy Treatment Patient Details Name: James Downs MRN: [id number] DOB: 1966-02-19 Today's Date: 12/01/2013    History of present illness 48 y.o. male admitted to [facility] on 11/29/13 s/p L T7 laminectomy, L T8 and T9 costotransversectomy, and T7/8, T8/9 discectomy and decompression of spinal cord.  Pt with significant PMHx of depression, gout, HTN, R hand surgery and R foot surgery.     OT comments  Patient is s/p L T7 laminectomy, L T8 and T9 costotransversectomy, and T7/8, T8/9 discectomy and decompression of spinal cord surgery resulting in functional limitations due to the deficits listed below (see OT problem list). PTA independent and working. Patient was limited today due to increased RR and painful cramping in chest while breathing. Patient will benefit from skilled OT acutely to increase independence and safety with ADLS to allow discharge home without follow up. OT to follow acutely for ADL retraining with back precautions. Recommend RW.   Follow Up Recommendations  No OT follow up    Equipment Recommendations  Other (comment) (RW (2 wheeled))    Recommendations for Other Services      Precautions / Restrictions Precautions Precautions: Fall;Back Precaution Booklet Issued: Yes (comment) Precaution Comments: Decreased sensation in both LE Restrictions Weight Bearing Restrictions: No       Mobility Bed Mobility Overal bed mobility: Needs Assistance Bed Mobility: Rolling;Sidelying to Sit;Sit to Sidelying Rolling: Min assist Sidelying to sit: Min assist     Sit to sidelying: Min assist General bed mobility comments: Min assist for trunk support during transitions. Verbal cues for log roll techniuqes and hand placement. Pt using railing for leverage  Transfers Overall transfer level: Needs assistance Equipment used: Rolling walker (2 wheeled) Transfers: Sit to/from Stand Sit to Stand: Min guard         General transfer comment: Cues for hand  placement. Pt needed rest break after log roll <> sitting EOB to slow down RR and catch his breath. Pt c/o "cramping in chest and it hurts." Pt used spirometer to attempt to improve breathing.    Balance Overall balance assessment: Needs assistance Sitting-balance support: Feet supported;No upper extremity supported Sitting balance-Leahy Scale: Good     Standing balance support: During functional activity;No upper extremity supported Standing balance-Leahy Scale: Fair Standing balance comment: LT LE buckling while standing at sink. Pt able to compensate and regain balance                   ADL Overall ADL's : Needs assistance/impaired     Grooming: Wash/dry hands;Standing;Supervision/safety                   Toilet Transfer: Hydrographic surveyor Details (indicate cue type and reason): Pt required verbal cues for hand placement on BSC. Pt needed rest break on BSC to slow down RR.          Functional mobility during ADLs: Min guard;Rolling walker General ADL Comments: Pt LT LE dragging while ambulating with RW. Pt presented with DOE during ADL task with RR at 33. Pt required extra rest breaks to decrease RR and continue with task. Pt verbalized 2/3 back precautions at beginning of session and 3/3 precautions at end of session.       Vision                     Perception     Praxis      Cognition   Behavior During Therapy: [facility] for tasks assessed/performed Overall Cognitive  Status: Within Functional Limits for tasks assessed       Memory: Decreased recall of precautions (Recalled 2/3 precautions at beginning of session)               Extremity/Trunk Assessment               Exercises     Shoulder Instructions       General Comments      Pertinent Vitals/ Pain       Pain Assessment: 0-10 Pain Score: 5  Pain Location: Surgical site; across ribs Pain Descriptors / Indicators: Aching Pain Intervention(s): Repositioned;PCA  encouraged  Home Living                                          Prior Functioning/Environment              Frequency Min 2X/week     Progress Toward Goals  OT Goals(current goals can now be found in the care plan section)     Acute Rehab OT Goals Patient Stated Goal: none stated OT Goal Formulation: With patient Time For Goal Achievement: 12/14/13 Potential to Achieve Goals: Good ADL Goals Pt Will Perform Lower Body Dressing: with modified independence;sit to/from stand Pt Will Transfer to Toilet: with modified independence;ambulating  Plan      Co-evaluation                 End of Session Equipment Utilized During Treatment: Gait belt;Rolling walker   Activity Tolerance Treatment limited secondary to medical complications (Comment) (Dyspnea)   Patient Left in bed;with call bell/phone within reach;with bed alarm set;with family/visitor present   Nurse Communication Mobility status;Precautions        Time: 0940-1006 OT Time Calculation (min): 26 min  Charges:    Nils PyleBermel, Julia 12/01/2013, 10:34 AM

## 2013-12-01 NOTE — Progress Notes (Signed)
PT Cancellation Note  Patient Details Name: James ParesRichard M Micallef MRN: [id number] DOB: 01/12/1966   Cancelled Treatment:    Reason Eval/Treat Not Completed: Pain limiting ability to participate.  Pt continuing to have significant pain and spasm this PM.  PCA encouraged, and he was able to work with OT earlier today.  PT will check back tomorrow and continue to encourage OOB mobility.    Thanks,    Rollene Rotundaebecca B. Medendorp, PT, DPT [phone]   12/01/2013, 3:57 PM

## 2013-12-02 ENCOUNTER — Inpatient Hospital Stay (HOSPITAL_COMMUNITY): Payer: BC Managed Care – PPO

## 2013-12-02 NOTE — Progress Notes (Signed)
Physical Therapy Treatment Patient Details Name: James ParesRichard M Downs MRN: [id number] DOB: 07/07/1965 Today's Date: 12/02/2013    History of Present Illness 48 y.o. male admitted [facility] 11/29/13 s/p L T7 laminectomy, L T8 and T9 costotransversectomy, and T7/8, T8/9 discectomy and decompression of spinal cord.  Pt with significant PMHx of depression, gout, HTN, R hand surgery and R foot surgery.      PT Comments    Pt is making very little progress in his mobility and is significantly limited by pain and bil leg weakness R>L.  He requires up to mod assist for short distance gait in his room and has not been able to progress gait to the hallway. He continues to need acute PT intervention and at this level I would recommend at least HHPT.  We may have to revisit CIR/SNF if he doesn't progress gait over the weekend.  PT to follow acutely.    Follow Up Recommendations  Home health PT;Supervision/Assistance - 24 hour     Equipment Recommendations  None recommended by PT    Recommendations for Other Services   NA     Precautions / Restrictions Precautions Precautions: Fall;Back Restrictions Weight Bearing Restrictions: No    Mobility  Bed Mobility Overal bed mobility: Needs Assistance Bed Mobility: Rolling;Sidelying to Sit Rolling: Supervision Sidelying to sit: Supervision     Sit to sidelying: Min assist General bed mobility comments: Min assist to support legs when getting back to side lying from sitting to get back in the bed.  Pt able to preform log roll to get out of bed with supervision.  Verbal cues for hand placment and knee flexion.    Transfers Overall transfer level: Needs assistance Equipment used: Rolling walker (2 wheeled) Transfers: Sit to/from Stand Sit to Stand: Min assist         General transfer comment: Min assist to support trunk during transitions from sitting to standing with RW.  Pt with heavy reliance on upper extremity support due to bil leg  weakness.   Ambulation/Gait Ambulation/Gait assistance: Min assist;Mod assist Ambulation Distance (Feet): 20 Feet Assistive device: Rolling walker (2 wheeled) Gait Pattern/deviations: Step-through pattern;Decreased step length - right;Decreased stance time - right;Decreased dorsiflexion - right;Decreased weight shift to right Gait velocity: decreased Gait velocity interpretation: Below normal speed for age/gender General Gait Details: Pt with flexed knee gait pattern and significant reliance on bil upper extremity support during gait.  Pt needed verbal cues to stay inside of RW so that he would not buckle and fall as we walked.  As he fatigued he required up to mod assist to support trunk over weak legs.  He is not progress gait to hallway ambulation and it might be helpful to have a second person to try to progress to hallway ambulation over the weekend.           Balance Overall balance assessment: Needs assistance Sitting-balance support: Feet supported;No upper extremity supported Sitting balance-Leahy Scale: Good     Standing balance support: Bilateral upper extremity supported Standing balance-Leahy Scale: Poor Standing balance comment: Pt unable to let go of RW and support himself in standing.                     Cognition Arousal/Alertness: Awake/alert Behavior During Therapy: WFL for tasks assessed/performed Overall Cognitive Status: Within Functional Limits for tasks assessed  General Comments General comments (skin integrity, edema, etc.): Pt did not want to sit up in chair after gait due to pain.       Pertinent Vitals/Pain Pain Assessment: 0-10 Pain Score: 5  Pain Location: mid back at incision, bil ribs Pain Descriptors / Indicators: Burning Pain Intervention(s): Limited activity within patient's tolerance;Monitored during session;Repositioned;Patient requesting pain meds-RN notified           PT Goals (current goals  can now be found in the care plan section) Acute Rehab PT Goals Patient Stated Goal: none stated Progress towards PT goals: Not progressing toward goals - comment (limited by pain and weakness)    Frequency  Min 5X/week    PT Plan Discharge plan needs to be updated       End of Session Equipment Utilized During Treatment: Gait belt Activity Tolerance: Patient limited by fatigue;Patient limited by pain Patient left: in bed;with call bell/phone within reach;with nursing/sitter in room     Time: [id number] PT Time Calculation (min): 27 min  Charges:  $Gait Training: 8-22 mins $Therapeutic Activity: 8-22 mins                      Rebecca B. Medendorp, PT, DPT [phone]   12/02/2013, 6:09 PM

## 2013-12-02 NOTE — Progress Notes (Signed)
Patient ID: James Downs, male   DOB: 05/13/1965, 48 y.o.   MRN: [id number] Subjective:  The patient is alert and pleasant. His back is appropriately sore. He is mobilizing slowly with PT.  Objective: Vital signs in last 24 hours: Temp:  [97.9 F (36.6 C)-98.7 F (37.1 C)] 98.6 F (37 C) (10/09 0951) Pulse Rate:  [100-114] 103 (10/09 0951) Resp:  [18-21] 18 (10/09 0951) BP: (104-137)/(60-81) 115/77 mmHg (10/09 0951) SpO2:  [93 %-98 %] 93 % (10/09 0951) FiO2 (%):  [96 %] 96 % (10/08 1155)  Intake/Output from previous day: 10/08 0701 - 10/09 0700 In: 243 [P.O.:240; I.V.:3] Out: 1050 [Urine:1050] Intake/Output this shift: Total I/O In: 120 [P.O.:120] Out: -   Physical exam the patient is alert and oriented x3. His strength is normal.  Lab Results:  Recent Labs  11/29/13 1816 11/29/13 2215  WBC  --  8.5  HGB 12.2* 12.7*  HCT 36.0* 36.6*  PLT  --  184   BMET  Recent Labs  11/29/13 1740 11/29/13 1816 11/29/13 2215  NA 139 138  --   K 4.4 4.4  --   GLUCOSE 131*  --   --   CREATININE  --   --  1.22    Studies/Results: No results found.  Assessment/Plan: Postop day #3: We will continue to mobilize the patient he may be able to go home tomorrow.  LOS: 3 days     JENKINS,JEFFREY D 12/02/2013, 10:59 AM

## 2013-12-03 NOTE — Progress Notes (Signed)
Occupational Therapy Treatment Patient Details Name: James ParesRichard M Downs MRN: [id number] DOB: 04/02/1965 Today's Date: 12/03/2013    History of present illness 48 y.o. male [facility] s/p L T7 laminectomy, L T8 and T9 costotransversectomy, and T7/8, T8/9 discectomy and decompression of spinal cord.  Pt with significant PMHx of depression, gout, HTN, R hand surgery and R foot surgery.     OT comments  Education provided. Pt with decreased balance with LB dressing and required assist with ambulation to bathroom.   Follow Up Recommendations  No OT follow up    Equipment Recommendations  Other (comment) (RW)    Recommendations for Other Services      Precautions / Restrictions Precautions Precautions: Fall;Back Precaution Booklet Issued: No Precaution Comments: reviewed back precautions Restrictions Weight Bearing Restrictions: No       Mobility Bed Mobility Overal bed mobility: Needs Assistance Bed Mobility: Sidelying to Sit   Sidelying to sit: Supervision       General bed mobility comments: cues to reinforce technique.  Transfers Overall transfer level: Needs assistance Equipment used: Rolling walker (2 wheeled) Transfers: Sit to/from Stand Sit to Stand: Min guard;Min assist         General transfer comment: cues for technique.     Balance Overall balance assessment: Needs assistance         Standing balance support: No upper extremity supported;During functional activity Standing balance-Leahy Scale: Poor Standing balance comment: decreased balance while pulling up pants requiring assist                   ADL Overall ADL's : Needs assistance/impaired     Grooming: Wash/dry hands;Min guard;Standing               Lower Body Dressing: Sit to/from stand;With adaptive equipment;Moderate assistance (decreased balance)   Toilet Transfer: Minimal assistance;Ambulation;RW;Comfort height toilet   Toileting- Clothing Manipulation  and Hygiene: Minimal assistance (standing)       Functional mobility during ADLs: Minimal assistance;Rolling walker General ADL Comments: Educated on placement of grooming items to avoid twisting. Discussed use of bag on walker and safe shoewear. Discussed technique for LB ADLs. Pt donned pants using reacher-able to cross left leg over knee with some effort, but liked using reacher better.      Vision                     Perception     Praxis      Cognition   Behavior During Therapy: WFL for tasks assessed/performed Overall Cognitive Status: Within Functional Limits for tasks assessed                       Extremity/Trunk Assessment               Exercises     Shoulder Instructions       General Comments      Pertinent Vitals/ Pain       Pain Assessment: 0-10 Pain Score: 8  Pain Location: back Pain Intervention(s): Patient requesting pain meds-RN notified  Home Living                                          Prior Functioning/Environment              Frequency Min 2X/week     Progress Toward Goals  OT Goals(current goals can now be found in the care plan section)  Progress towards OT goals: Not progressing toward goals - comment  Acute Rehab OT Goals Patient Stated Goal: none stated OT Goal Formulation: With patient Time For Goal Achievement: 12/14/13 Potential to Achieve Goals: Good ADL Goals Pt Will Perform Lower Body Dressing: with modified independence;sit to/from stand Pt Will Transfer to Toilet: with modified independence;ambulating  Plan Discharge plan remains appropriate    Co-evaluation                 End of Session Equipment Utilized During Treatment: Gait belt;Rolling walker   Activity Tolerance Patient tolerated treatment well   Patient Left in chair;with call bell/phone within reach   Nurse Communication Patient requests pain meds        Time: [id number] OT Time Calculation  (min): 17 min  Charges: OT General Charges $OT Visit: 1 Procedure OT Treatments $Self Care/Home Management : 8-22 mins  Earlie RavelingStraub, Lindsey L OTR/L [phone] 12/03/2013, 6:04 PM

## 2013-12-03 NOTE — Progress Notes (Signed)
Physical Therapy Treatment Patient Details Name: James Downs MRN: [id number] DOB: 05/19/1965 Today's Date: 12/03/2013    History of Present Illness 48 y.o. male [facility] s/p L T7 laminectomy, L T8 and T9 costotransversectomy, and T7/8, T8/9 discectomy and decompression of spinal cord.  Pt with significant PMHx of depression, gout, HTN, R hand surgery and R foot surgery.      PT Comments    Pt progressing with mobility/PT goals at this date.  Increased ambulation distance.  Pt reports 8/10 back & bil rib pain but overall moves with min guard.  Encouraged pt to increase activity by ambulating 2-3x's/day & sitting in chair for meals.  Pt agreeable.      Follow Up Recommendations  Home health PT;Supervision/Assistance - 24 hour     Equipment Recommendations  None recommended by PT    Recommendations for Other Services       Precautions / Restrictions Precautions Precautions: Fall;Back Restrictions Weight Bearing Restrictions: No    Mobility  Bed Mobility Overal bed mobility: Needs Assistance Bed Mobility: Rolling;Sidelying to Sit Rolling: Supervision Sidelying to sit: Supervision       General bed mobility comments: cues to reinforce technique  Transfers Overall transfer level: Needs assistance Equipment used: Rolling walker (2 wheeled) Transfers: Sit to/from Stand Sit to Stand: Min guard         General transfer comment: cues for hand placement.  guarding for safety  Ambulation/Gait Ambulation/Gait assistance: Min guard Ambulation Distance (Feet): 75 Feet Assistive device: Rolling walker (2 wheeled) Gait Pattern/deviations: Step-through pattern;Decreased stride length Gait velocity: decreased   General Gait Details: slow but steady.  encouragement to increase distance.  fluidity of gait improved as distance increased.     Stairs            Wheelchair Mobility    Modified Rankin (Stroke Patients Only)       Balance                                    Cognition Arousal/Alertness: Awake/alert Behavior During Therapy: WFL for tasks assessed/performed Overall Cognitive Status: Within Functional Limits for tasks assessed                      Exercises      General Comments        Pertinent Vitals/Pain Pain Assessment: 0-10 Pain Score: 8  Pain Location: mid back at incision & bil ribs Pain Descriptors / Indicators: Aching;Burning Pain Intervention(s): Limited activity within patient's tolerance;Premedicated before session;Monitored during session;Repositioned    Home Living                      Prior Function            PT Goals (current goals can now be found in the care plan section) Acute Rehab PT Goals PT Goal Formulation: With patient Time For Goal Achievement: 12/07/13 Potential to Achieve Goals: Good Progress towards PT goals: Progressing toward goals    Frequency  Min 5X/week    PT Plan      Co-evaluation             End of Session Equipment Utilized During Treatment: Gait belt Activity Tolerance: Patient tolerated treatment well Patient left: in chair;with call bell/phone within reach;with family/visitor present     Time: [id number] PT Time Calculation (min): 20 min  Charges:  $  Gait Training: 8-22 mins                    G Codes:      Lara MulchCooper, Kelly Lynn 12/03/2013, 12:47 PM  Verdell FaceKelly Cooper, VirginiaPTA [phone] 12/03/2013

## 2013-12-03 NOTE — Progress Notes (Signed)
Patient ID: James Downs, male   DOB: 05/03/1965, 48 y.o.   MRN: [id number] Afeb, vss No new neuro issues. SLOWLY increasing activity. Looks like he may be here a day or 2 more. Pain moderately controlled.

## 2013-12-04 MED ORDER — DSS 100 MG PO CAPS: 100.0000 mg | ORAL_CAPSULE | Freq: Two times a day (BID) | ORAL | Status: DC

## 2013-12-04 MED ORDER — OXYCODONE-ACETAMINOPHEN 10-325 MG PO TABS
1.0000 | ORAL_TABLET | ORAL | Status: DC | PRN
Start: 2013-12-04 — End: 2014-11-29

## 2013-12-04 MED ORDER — DIAZEPAM 5 MG PO TABS: 5.0000 mg | ORAL_TABLET | Freq: Four times a day (QID) | ORAL | Status: DC | PRN

## 2013-12-04 NOTE — Progress Notes (Signed)
PT Cancellation Note  Patient Details Name: James Downs MRN: [id number] DOB: 03/15/1965   Cancelled Treatment:     Pt deferring PT session due to just wanting to rest before d/cing home.      Verdell FaceKelly Cooper, [place]PTA [phone] 12/04/2013

## 2013-12-04 NOTE — Discharge Summary (Signed)
Physician Discharge Summary  Patient ID: James ParesRichard M Ernsberger MRN: [id number] DOB/AGE: 49/07/1965 49 y.o.  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 Cyndie ChimeNguyen, performed at T7-8 and T8-9 construct transversectomy for resection of thoracic herniated disc on the patient on 11/29/2013.  The patient's postoperative course was unremarkable. On 12/04/2013 the patient requested discharge to home. The patient, and his family, were given oral and written discharge instructions. All her questions were answered.  Consults: PT Significant Diagnostic Studies: None Treatments: T7-8 and T8-9 costotransversectomy and discectomy Discharge Exam: Blood pressure 120/86, pulse 111, temperature 97.4 F (36.3 C), temperature source Oral, resp. rate 18, height 5\' 7"  (1.702 m), weight 105.235 kg (232 lb), SpO2 84.00%. The patient is alert and pleasant. His dressing is clean and dry. His strength is grossly normal his lower extremities.  Disposition: Home  Discharge Instructions   Call MD for:  difficulty breathing, headache or visual disturbances    Complete by:  As directed      Call MD for:  extreme fatigue    Complete by:  As directed      Call MD for:  hives    Complete by:  As directed      Call MD for:  persistant dizziness or light-headedness    Complete by:  As directed      Call MD for:  persistant nausea and vomiting    Complete by:  As directed      Call MD for:  redness, tenderness, or signs of infection (pain, swelling, redness, odor or green/yellow discharge around incision site)    Complete by:  As directed      Call MD for:  severe uncontrolled pain    Complete by:  As directed      Call MD for:  temperature >100.4    Complete by:  As directed      Diet - low  sodium heart healthy    Complete by:  As directed      Discharge instructions    Complete by:  As directed   Call [phone] for a followup appointment. Take a stool softener while you are using pain medications.     Driving Restrictions    Complete by:  As directed   Do not drive for 2 weeks.     Increase activity slowly    Complete by:  As directed      Lifting restrictions    Complete by:  As directed   Do not lift more than 5 pounds. No excessive bending or twisting.     May shower / Bathe    Complete by:  As directed   He may shower after the pain she is removed 3 days after surgery. Leave the incision alone.     No dressing needed    Complete by:  As directed             Medication List    STOP taking these medications       HYDROcodone-acetaminophen 5-325 MG per tablet  Commonly known as:  NORCO/VICODIN      TAKE these medications       allopurinol 100 MG tablet  Commonly known as:  ZYLOPRIM  Take 100 mg by mouth daily.     diazepam 5 MG tablet  Commonly known as:  VALIUM  Take 1 tablet (5 mg total) by mouth every 6 (six) hours as needed for muscle spasms.     DSS 100 MG Caps  Take 100 mg by mouth 2 (two) times daily.     fexofenadine-pseudoephedrine 180-240 MG per 24 hr tablet  Commonly known as:  ALLEGRA-D 24  Take 1 tablet by mouth daily.     gabapentin 100 MG capsule  Commonly known as:  NEURONTIN  Take 1 capsule (100 mg total) by mouth 3 (three) times daily.     naproxen sodium 220 MG tablet  Commonly known as:  ANAPROX  Take 440 mg by mouth 2 (two) times daily with a meal.     oxyCODONE-acetaminophen 10-325 MG per tablet  Commonly known as:  PERCOCET  Take 1 tablet by mouth every 4 (four) hours as needed for pain.     simvastatin 40 MG tablet  Commonly known as:  ZOCOR  Take 40 mg by mouth daily.     telmisartan-hydrochlorothiazide 80-25 MG per tablet  Commonly known as:  MICARDIS HCT  Take 1 tablet by mouth daily.          SignedTressie Stalker: JENKINS,JEFFREY D 12/04/2013, 11:16 AM

## 2013-12-04 NOTE — Progress Notes (Signed)
Patient d/c home, d/c instruction given and patient verbalized understanding. 

## 2013-12-13 DIAGNOSIS — Z0289 Encounter for other administrative examinations: Secondary | ICD-10-CM

## 2014-01-09 ENCOUNTER — Ambulatory Visit: Payer: BC Managed Care – PPO | Admitting: Physical Therapy

## 2014-11-29 ENCOUNTER — Emergency Department (HOSPITAL_BASED_OUTPATIENT_CLINIC_OR_DEPARTMENT_OTHER)
Admission: EM | Admit: 2014-11-29 | Discharge: 2014-11-29 | Disposition: A | Discharge status: Home / Self Care | Payer: BLUE CROSS/BLUE SHIELD | Attending: Emergency Medicine | Admitting: Emergency Medicine

## 2014-11-29 ENCOUNTER — Encounter (HOSPITAL_BASED_OUTPATIENT_CLINIC_OR_DEPARTMENT_OTHER): Payer: Self-pay | Admitting: *Deleted

## 2014-11-29 DIAGNOSIS — Z8659 Personal history of other mental and behavioral disorders: Secondary | ICD-10-CM | POA: Diagnosis not present

## 2014-11-29 DIAGNOSIS — K529 Noninfective gastroenteritis and colitis, unspecified: Secondary | ICD-10-CM

## 2014-11-29 DIAGNOSIS — M109 Gout, unspecified: Secondary | ICD-10-CM | POA: Diagnosis not present

## 2014-11-29 DIAGNOSIS — G4730 Sleep apnea, unspecified: Secondary | ICD-10-CM | POA: Diagnosis not present

## 2014-11-29 DIAGNOSIS — M1990 Unspecified osteoarthritis, unspecified site: Secondary | ICD-10-CM | POA: Diagnosis not present

## 2014-11-29 DIAGNOSIS — B349 Viral infection, unspecified: Secondary | ICD-10-CM | POA: Diagnosis not present

## 2014-11-29 DIAGNOSIS — Z720 Tobacco use: Secondary | ICD-10-CM | POA: Diagnosis not present

## 2014-11-29 DIAGNOSIS — Z79899 Other long term (current) drug therapy: Secondary | ICD-10-CM | POA: Diagnosis not present

## 2014-11-29 DIAGNOSIS — A084 Viral intestinal infection, unspecified: Secondary | ICD-10-CM | POA: Diagnosis not present

## 2014-11-29 DIAGNOSIS — I10 Essential (primary) hypertension: Secondary | ICD-10-CM | POA: Diagnosis not present

## 2014-11-29 DIAGNOSIS — Z8639 Personal history of other endocrine, nutritional and metabolic disease: Secondary | ICD-10-CM | POA: Insufficient documentation

## 2014-11-29 DIAGNOSIS — Z87442 Personal history of urinary calculi: Secondary | ICD-10-CM | POA: Insufficient documentation

## 2014-11-29 DIAGNOSIS — Z791 Long term (current) use of non-steroidal anti-inflammatories (NSAID): Secondary | ICD-10-CM | POA: Diagnosis not present

## 2014-11-29 DIAGNOSIS — R197 Diarrhea, unspecified: Secondary | ICD-10-CM | POA: Diagnosis present

## 2014-11-29 LAB — BASIC METABOLIC PANEL
Anion gap: <3 — ABNORMAL LOW (ref 5–15)
BUN: 11 mg/dL (ref 6–20)
CO2: 25 mmol/L (ref 22–32)
Calcium: 8.9 mg/dL (ref 8.9–10.3)
Chloride: 112 mmol/L — ABNORMAL HIGH (ref 101–111)
Creatinine, Ser: 1.2 mg/dL (ref 0.61–1.24)
GFR calc Af Amer: >60 mL/min (ref >60)
GFR calc non Af Amer: >60 mL/min (ref >60)
Glucose, Bld: 106 mg/dL — ABNORMAL HIGH (ref 65–99)
Potassium: 4 mmol/L (ref 3.5–5.1)
Sodium: 139 mmol/L (ref 135–145)

## 2014-11-29 LAB — RAPID STREP SCREEN (MED CTR MEBANE ONLY): Streptococcus, Group A Screen (Direct): NEGATIVE

## 2014-11-29 MED ORDER — ONDANSETRON 4 MG PO TBDP: 4.0000 mg | ORAL_TABLET | Freq: Three times a day (TID) | ORAL | Status: DC | PRN

## 2014-11-29 MED ORDER — DIPHENOXYLATE-ATROPINE 2.5-0.025 MG PO TABS
1.0000 | ORAL_TABLET | Freq: Four times a day (QID) | ORAL | Status: DC | PRN
Start: 2014-11-29 — End: 2016-04-29

## 2014-11-29 MED ORDER — SODIUM CHLORIDE 0.9 % IV BOLUS (SEPSIS)
1000.0000 mL | Freq: Once | INTRAVENOUS | Status: AC
  Administered 2014-11-29: 1000 mL via INTRAVENOUS

## 2014-11-29 MED ORDER — DIPHENOXYLATE-ATROPINE 2.5-0.025 MG PO TABS
2.0000 | ORAL_TABLET | Freq: Once | ORAL | Status: AC
  Administered 2014-11-29: 2 via ORAL
  Filled 2014-11-29: qty 2

## 2014-11-29 MED ORDER — ONDANSETRON HCL 4 MG/2ML IJ SOLN
4.0000 mg | Freq: Once | INTRAMUSCULAR | Status: AC
  Administered 2014-11-29: 4 mg via INTRAVENOUS
  Filled 2014-11-29: qty 2

## 2014-11-29 NOTE — ED Provider Notes (Signed)
CSN: [id number]     Arrival date & time 11/29/14  0729 History   None    No chief complaint on file.     HPI  Patient presents for evaluation with a 2 day illness. Diarrhea and nausea since yesterday. Poor appetite. However, no vomiting. He states his throat feels "scratchy". Feels like he is "hoarse". No cough or sputum production. No difficulty breathing or shortness of breath. No headache. Mild diffuse bodyaches. No abdominal pain or cramps. 6-7 episodes of loose stool since yesterday. States he feels "weak as a kid". No blood pus or mucus in his stools. No urinary symptoms. No extremity symptoms.  Past Medical History  Diagnosis Date  . Depression   . Gout   . Hypercholesterolemia   . Kidney stone   . Hypertension     on meds  . Sleep apnea     uses CPAP  . Arthritis    Past Surgical History  Procedure Laterality Date  . Hand surgery Right 1985  . Foot surgery Right 1991  . Posterior lumbar fusion 4 level N/A 11/29/2013    Procedure: Thoracic Eight, Thoracic Nine, Costotransversectomy   Thoracic Seven-Eight Thoracic Eight-Nine diskectomy;  Surgeon: Lisbeth Renshaw, MD;  Location: [facility];  Service: Neurosurgery;  Laterality: N/A;  Thoracic Eight, Thoracic Nine, Costotransversectomy   Thoracic Seven-Eight Thoracic Eight-Nine diskectomy   Family History  Problem Relation Age of Onset  . Cancer Father     BRAIN/PROSTATE/SKIN  . Cancer Paternal Grandfather   . Heart failure Maternal Grandmother   . Diabetes Maternal Grandmother   . Hypertension Father   . Hypertension Maternal Grandmother   . Hypertension Maternal Grandfather    Social History  Substance Use Topics  . Smoking status: Current Every Day Smoker -- 1.00 packs/day for 30 years    Types: Cigarettes  . Smokeless tobacco: Former Neurosurgeon    Types: Snuff, Chew  . Alcohol Use: No    Review of Systems  Constitutional: Negative for fever, chills, diaphoresis, appetite change and fatigue.  HENT: Positive for  sore throat. Negative for mouth sores and trouble swallowing.   Eyes: Negative for visual disturbance.  Respiratory: Negative for cough, chest tightness, shortness of breath and wheezing.   Cardiovascular: Negative for chest pain.  Gastrointestinal: Positive for nausea and diarrhea. Negative for vomiting, abdominal pain and abdominal distention.  Endocrine: Negative for polydipsia, polyphagia and polyuria.  Genitourinary: Negative for dysuria, frequency and hematuria.  Musculoskeletal: Negative for gait problem.  Skin: Negative for color change, pallor and rash.  Neurological: Negative for dizziness, syncope, light-headedness and headaches.  Hematological: Does not bruise/bleed easily.  Psychiatric/Behavioral: Negative for behavioral problems and confusion.      Allergies  Review of patient's allergies indicates no known allergies.  Home Medications   Prior to Admission medications   Medication Sig Start Date End Date Taking? Authorizing Provider  fexofenadine-pseudoephedrine (ALLEGRA-D 24) 180-240 MG per 24 hr tablet Take 1 tablet by mouth daily.   Yes Historical Provider, MD  naproxen sodium (ANAPROX) 220 MG tablet Take 440 mg by mouth 2 (two) times daily with a meal.    Yes Historical Provider, MD  diphenoxylate-atropine (LOMOTIL) 2.5-0.025 MG tablet Take 1 tablet by mouth 4 (four) times daily as needed for diarrhea or loose stools. 11/29/14   Rolland Porter, MD  ondansetron (ZOFRAN ODT) 4 MG disintegrating tablet Take 1 tablet (4 mg total) by mouth every 8 (eight) hours as needed for nausea. 11/29/14   Rolland Porter, MD  BP 177/93 mmHg  Pulse 74  Temp(Src) 98.8 F (37.1 C) (Oral)  Resp 18  Ht  (1.727 m)  Wt 240 lb (108.863 kg)  BMI 36.50 kg/m2  SpO2 100% Physical Exam  Constitutional: He is oriented to person, place, and time. He appears well-developed and well-nourished. No distress.  HENT:  Head: Normocephalic.  Eyes: Conjunctivae are normal. Pupils are equal, round, and  reactive to light. No scleral icterus.  Neck: Normal range of motion. Neck supple. No thyromegaly present.  Cardiovascular: Normal rate and regular rhythm.  Exam reveals no gallop and no friction rub.   No murmur heard. Pulmonary/Chest: Effort normal and breath sounds normal. No respiratory distress. He has no wheezes. He has no rales.  Abdominal: Soft. Bowel sounds are normal. He exhibits no distension. There is no tenderness. There is no rebound.  Musculoskeletal: Normal range of motion.  Neurological: He is alert and oriented to person, place, and time.  Skin: Skin is warm and dry. No rash noted.  Psychiatric: He has a normal mood and affect. His behavior is normal.    ED Course  Procedures (including critical care time) Labs Review Labs Reviewed  BASIC METABOLIC PANEL - Abnormal; Notable for the following:    Chloride 112 (*)    Glucose, Bld 106 (*)    Anion gap <3 (*)    All other components within normal limits  RAPID STREP SCREEN (NOT AT [facility])  CULTURE, GROUP A STREP    Imaging Review No results found. I have personally reviewed and evaluated these images and lab results as part of my medical decision-making.   EKG Interpretation None      MDM   Final diagnoses:  Viral syndrome  Gastroenteritis  Viral gastroenteritis        Rolland Porter, MD 12/02/14 [phone]

## 2014-11-29 NOTE — ED Notes (Signed)
C/o diarrhea and nausea since yesterday with scratchy throat. Productive cough does not know what color. Feeling weak. States he is hot and cold but has not checked temp.

## 2014-11-29 NOTE — Discharge Instructions (Signed)

## 2014-11-29 NOTE — ED Notes (Signed)
MD at bedside. 

## 2014-12-01 LAB — CULTURE, GROUP A STREP: Strep A Culture: NEGATIVE

## 2015-03-13 ENCOUNTER — Emergency Department (HOSPITAL_BASED_OUTPATIENT_CLINIC_OR_DEPARTMENT_OTHER): Payer: BLUE CROSS/BLUE SHIELD

## 2015-03-13 ENCOUNTER — Encounter (HOSPITAL_BASED_OUTPATIENT_CLINIC_OR_DEPARTMENT_OTHER): Payer: Self-pay | Admitting: *Deleted

## 2015-03-13 ENCOUNTER — Emergency Department (HOSPITAL_BASED_OUTPATIENT_CLINIC_OR_DEPARTMENT_OTHER)
Admission: EM | Admit: 2015-03-13 | Discharge: 2015-03-13 | Disposition: A | Discharge status: Home / Self Care | Payer: BLUE CROSS/BLUE SHIELD | Attending: Emergency Medicine | Admitting: Emergency Medicine

## 2015-03-13 DIAGNOSIS — J329 Chronic sinusitis, unspecified: Secondary | ICD-10-CM

## 2015-03-13 DIAGNOSIS — Z8639 Personal history of other endocrine, nutritional and metabolic disease: Secondary | ICD-10-CM | POA: Diagnosis not present

## 2015-03-13 DIAGNOSIS — Z8659 Personal history of other mental and behavioral disorders: Secondary | ICD-10-CM | POA: Diagnosis not present

## 2015-03-13 DIAGNOSIS — I10 Essential (primary) hypertension: Secondary | ICD-10-CM | POA: Diagnosis not present

## 2015-03-13 DIAGNOSIS — Z79899 Other long term (current) drug therapy: Secondary | ICD-10-CM | POA: Diagnosis not present

## 2015-03-13 DIAGNOSIS — G4730 Sleep apnea, unspecified: Secondary | ICD-10-CM | POA: Diagnosis not present

## 2015-03-13 DIAGNOSIS — Z791 Long term (current) use of non-steroidal anti-inflammatories (NSAID): Secondary | ICD-10-CM | POA: Diagnosis not present

## 2015-03-13 DIAGNOSIS — Z87442 Personal history of urinary calculi: Secondary | ICD-10-CM | POA: Diagnosis not present

## 2015-03-13 DIAGNOSIS — M1990 Unspecified osteoarthritis, unspecified site: Secondary | ICD-10-CM | POA: Insufficient documentation

## 2015-03-13 DIAGNOSIS — F17210 Nicotine dependence, cigarettes, uncomplicated: Secondary | ICD-10-CM | POA: Insufficient documentation

## 2015-03-13 DIAGNOSIS — R05 Cough: Secondary | ICD-10-CM | POA: Diagnosis present

## 2015-03-13 DIAGNOSIS — M109 Gout, unspecified: Secondary | ICD-10-CM | POA: Diagnosis not present

## 2015-03-13 MED ORDER — AMOXICILLIN 500 MG PO CAPS: 1000.0000 mg | ORAL_CAPSULE | Freq: Three times a day (TID) | ORAL | Status: DC

## 2015-03-13 MED ORDER — AMLODIPINE BESYLATE 5 MG PO TABS
5.0000 mg | ORAL_TABLET | Freq: Once | ORAL | Status: AC
  Administered 2015-03-13: 5 mg via ORAL
  Filled 2015-03-13: qty 1

## 2015-03-13 MED ORDER — HYDROCHLOROTHIAZIDE 25 MG PO TABS
25.0000 mg | ORAL_TABLET | Freq: Once | ORAL | Status: AC
  Administered 2015-03-13: 25 mg via ORAL
  Filled 2015-03-13: qty 1

## 2015-03-13 MED ORDER — TELMISARTAN-HCTZ 80-25 MG PO TABS: 1.0000 | ORAL_TABLET | Freq: Every day | ORAL | Status: DC

## 2015-03-13 NOTE — Discharge Instructions (Signed)
Hypertension Her blood pressure is uncontrolled. The follow-up with Dr. Thea Silversmith for a sooner appointment and take your blood pressure medication. Return to the ED if you develop worsening headache, chest pain, back pain,  Or any other concerns. Hypertension, commonly called high blood pressure, is when the force of blood pumping through your arteries is too strong. Your arteries are the blood vessels that carry blood from your heart throughout your body. A blood pressure reading consists of a higher number over a lower number, such as 110/72. The higher number (systolic) is the pressure inside your arteries when your heart pumps. The lower number (diastolic) is the pressure inside your arteries when your heart relaxes. Ideally you want your blood pressure below 120/80. Hypertension forces your heart to work harder to pump blood. Your arteries may become narrow or stiff. Having untreated or uncontrolled hypertension can cause heart attack, stroke, kidney disease, and other problems. RISK FACTORS Some risk factors for high blood pressure are controllable. Others are not.  Risk factors you cannot control include:   Race. You may be at higher risk if you are African American.  Age. Risk increases with age.  Gender. Men are at higher risk than women before age 106 years. After age 3, women are at higher risk than men. Risk factors you can control include:  Not getting enough exercise or physical activity.  Being overweight.  Getting too much fat, sugar, calories, or salt in your diet.  Drinking too much alcohol. SIGNS AND SYMPTOMS Hypertension does not usually cause signs or symptoms. Extremely high blood pressure (hypertensive crisis) may cause headache, anxiety, shortness of breath, and nosebleed. DIAGNOSIS To check if you have hypertension, your health care provider will measure your blood pressure while you are seated, with your arm held at the level of your heart. It should be measured at  least twice using the same arm. Certain conditions can cause a difference in blood pressure between your right and left arms. A blood pressure reading that is higher than normal on one occasion does not mean that you need treatment. If it is not clear whether you have high blood pressure, you may be asked to return on a different day to have your blood pressure checked again. Or, you may be asked to monitor your blood pressure at home for 1 or more weeks. TREATMENT Treating high blood pressure includes making lifestyle changes and possibly taking medicine. Living a healthy lifestyle can help lower high blood pressure. You may need to change some of your habits. Lifestyle changes may include:  Following the DASH diet. This diet is high in fruits, vegetables, and whole grains. It is low in salt, red meat, and added sugars.  Keep your sodium intake below 2,300 mg per day.  Getting at least 30-45 minutes of aerobic exercise at least 4 times per week.  Losing weight if necessary.  Not smoking.  Limiting alcoholic beverages.  Learning ways to reduce stress. Your health care provider may prescribe medicine if lifestyle changes are not enough to get your blood pressure under control, and if one of the following is true:  You are 5-75 years of age and your systolic blood pressure is above 140.  You are 16 years of age or older, and your systolic blood pressure is above 150.  Your diastolic blood pressure is above 90.  You have diabetes, and your systolic blood pressure is over 140 or your diastolic blood pressure is over 90.  You have kidney disease and  your blood pressure is above 140/90.  You have heart disease and your blood pressure is above 140/90. Your personal target blood pressure may vary depending on your medical conditions, your age, and other factors. HOME CARE INSTRUCTIONS  Have your blood pressure rechecked as directed by your health care provider.   Take medicines only as  directed by your health care provider. Follow the directions carefully. Blood pressure medicines must be taken as prescribed. The medicine does not work as well when you skip doses. Skipping doses also puts you at risk for problems.  Do not smoke.   Monitor your blood pressure at home as directed by your health care provider. SEEK MEDICAL CARE IF:   You think you are having a reaction to medicines taken.  You have recurrent headaches or feel dizzy.  You have swelling in your ankles.  You have trouble with your vision. SEEK IMMEDIATE MEDICAL CARE IF:  You develop a severe headache or confusion.  You have unusual weakness, numbness, or feel faint.  You have severe chest or abdominal pain.  You vomit repeatedly.  You have trouble breathing. MAKE SURE YOU:   Understand these instructions.  Will watch your condition.  Will get help right away if you are not doing well or get worse.   This information is not intended to replace advice given to you by your health care provider. Make sure you discuss any questions you have with your health care provider.   Document Released: 02/10/2005 Document Revised: 06/27/2014 Document Reviewed: 12/03/2012 Elsevier Interactive Patient Education 2016 [employer]  Sinusitis, Adult Sinusitis is redness, soreness, and inflammation of the paranasal sinuses. Paranasal sinuses are air pockets within the bones of your face. They are located beneath your eyes, in the middle of your forehead, and above your eyes. In healthy paranasal sinuses, mucus is able to drain out, and air is able to circulate through them by way of your nose. However, when your paranasal sinuses are inflamed, mucus and air can become trapped. This can allow bacteria and other germs to grow and cause infection. Sinusitis can develop quickly and last only a short time (acute) or continue over a long period (chronic). Sinusitis that lasts for more than 12 weeks is considered  chronic. CAUSES Causes of sinusitis include:  Allergies.  Structural abnormalities, such as displacement of the cartilage that separates your nostrils (deviated septum), which can decrease the air flow through your nose and sinuses and affect sinus drainage.  Functional abnormalities, such as when the small hairs (cilia) that line your sinuses and help remove mucus do not work properly or are not present. SIGNS AND SYMPTOMS Symptoms of acute and chronic sinusitis are the same. The primary symptoms are pain and pressure around the affected sinuses. Other symptoms include:  Upper toothache.  Earache.  Headache.  Bad breath.  Decreased sense of smell and taste.  A cough, which worsens when you are lying flat.  Fatigue.  Fever.  Thick drainage from your nose, which often is green and may contain pus (purulent).  Swelling and warmth over the affected sinuses. DIAGNOSIS Your health care provider will perform a physical exam. During your exam, your health care provider may perform any of the following to help determine if you have acute sinusitis or chronic sinusitis:  Look in your nose for signs of abnormal growths in your nostrils (nasal polyps).  Tap over the affected sinus to check for signs of infection.  View the inside of your sinuses using an  imaging device that has a light attached (endoscope). If your health care provider suspects that you have chronic sinusitis, one or more of the following tests may be recommended:  Allergy tests.  Nasal culture. A sample of mucus is taken from your nose, sent to a lab, and screened for bacteria.  Nasal cytology. A sample of mucus is taken from your nose and examined by your health care provider to determine if your sinusitis is related to an allergy. TREATMENT Most cases of acute sinusitis are related to a viral infection and will resolve on their own within 10 days. Sometimes, medicines are prescribed to help relieve symptoms of  both acute and chronic sinusitis. These may include pain medicines, decongestants, nasal steroid sprays, or saline sprays. However, for sinusitis related to a bacterial infection, your health care provider will prescribe antibiotic medicines. These are medicines that will help kill the bacteria causing the infection. Rarely, sinusitis is caused by a fungal infection. In these cases, your health care provider will prescribe antifungal medicine. For some cases of chronic sinusitis, surgery is needed. Generally, these are cases in which sinusitis recurs more than 3 times per year, despite other treatments. HOME CARE INSTRUCTIONS  Drink plenty of water. Water helps thin the mucus so your sinuses can drain more easily.  Use a humidifier.  Inhale steam 3-4 times a day (for example, sit in the bathroom with the shower running).  Apply a warm, moist washcloth to your face 3-4 times a day, or as directed by your health care provider.  Use saline nasal sprays to help moisten and clean your sinuses.  Take medicines only as directed by your health care provider.  If you were prescribed either an antibiotic or antifungal medicine, finish it all even if you start to feel better. SEEK IMMEDIATE MEDICAL CARE IF:  You have increasing pain or severe headaches.  You have nausea, vomiting, or drowsiness.  You have swelling around your face.  You have vision problems.  You have a stiff neck.  You have difficulty breathing.   This information is not intended to replace advice given to you by your health care provider. Make sure you discuss any questions you have with your health care provider.   Document Released: 02/10/2005 Document Revised: 03/03/2014 Document Reviewed: 02/25/2011 Elsevier Interactive Patient Education [employer]

## 2015-03-13 NOTE — ED Notes (Signed)
MD at bedside. 

## 2015-03-13 NOTE — ED Provider Notes (Signed)
CSN: [id number]     Arrival date & time 03/13/15  1832 History  By signing my name below, I, James Downs, attest that this documentation has been prepared under the direction and in the presence of James Octave, MD. Electronically Signed: Octavia Downs, ED Scribe. 03/13/2015. 7:10 PM.    Chief Complaint  Patient presents with  . URI      The history is provided by the patient. No language interpreter was used.   HPI Comments: James Downs is a 50 y.o. male who has a hx of HTN and hypercholesteremia presents to the Emergency Department complaining of constant, gradual worsening flu-like symptoms onset yesterday. Pt has been having scratchy throat, headache, generalized body aches, mild productive cough, rhinorrhea, and chills. He endorses mild chest tightness when he coughs. Pt reports taking Aleve and OTC cold/flu medication to alleviate his symptoms with no relief. He notes being around his wife who has the same symptoms. Pt denies vomiting, chest pain, and loss of appetite.  Pt says he has been off of his blood pressure medication for one year due to it decreasing too low after he had back surgery. He has been having a higher pressure than normal for a few days and has an appointment with is PCP in March.  Past Medical History  Diagnosis Date  . Depression   . Gout   . Hypercholesterolemia   . Kidney stone   . Hypertension     on meds  . Sleep apnea     uses CPAP  . Arthritis    Past Surgical History  Procedure Laterality Date  . Hand surgery Right 1985  . Foot surgery Right 1991  . Posterior lumbar fusion 4 level N/A 11/29/2013    Procedure: Thoracic Eight, Thoracic Nine, Costotransversectomy   Thoracic Seven-Eight Thoracic Eight-Nine diskectomy;  Surgeon: Lisbeth Renshaw, MD;  Location: [facility];  Service: Neurosurgery;  Laterality: N/A;  Thoracic Eight, Thoracic Nine, Costotransversectomy   Thoracic Seven-Eight Thoracic Eight-Nine diskectomy   Family History   Problem Relation Age of Onset  . Cancer Father     BRAIN/PROSTATE/SKIN  . Cancer Paternal Grandfather   . Heart failure Maternal Grandmother   . Diabetes Maternal Grandmother   . Hypertension Father   . Hypertension Maternal Grandmother   . Hypertension Maternal Grandfather    Social History  Substance Use Topics  . Smoking status: Current Every Day Smoker -- 1.00 packs/day for 30 years    Types: Cigarettes  . Smokeless tobacco: Former Neurosurgeon    Types: Snuff, Chew  . Alcohol Use: No    Review of Systems  A complete 10 system review of systems was obtained and all systems are negative except as noted in the HPI and PMH.    Allergies  Review of patient's allergies indicates no known allergies.  Home Medications   Prior to Admission medications   Medication Sig Start Date End Date Taking? Authorizing Provider  amoxicillin (AMOXIL) 500 MG capsule Take 2 capsules (1,000 mg total) by mouth 3 (three) times daily. 03/13/15   James Octave, MD  diphenoxylate-atropine (LOMOTIL) 2.5-0.025 MG tablet Take 1 tablet by mouth 4 (four) times daily as needed for diarrhea or loose stools. 11/29/14   Rolland Porter, MD  fexofenadine-pseudoephedrine (ALLEGRA-D 24) 180-240 MG per 24 hr tablet Take 1 tablet by mouth daily.    Historical Provider, MD  naproxen sodium (ANAPROX) 220 MG tablet Take 440 mg by mouth 2 (two) times daily with a meal.  Historical Provider, MD  ondansetron (ZOFRAN ODT) 4 MG disintegrating tablet Take 1 tablet (4 mg total) by mouth every 8 (eight) hours as needed for nausea. 11/29/14   Rolland Porter, MD  telmisartan-hydrochlorothiazide (MICARDIS HCT) 80-25 MG tablet Take 1 tablet by mouth daily. 03/13/15   James Octave, MD   Triage vitals: BP 197/125 mmHg  Pulse 86  Temp(Src) 98.5 F (36.9 C) (Oral)  Resp 20  Ht  (1.702 m)  Wt 250 lb (113.399 kg)  BMI 39.15 kg/m2  SpO2 99% Physical Exam  Constitutional: He is oriented to person, place, and time. He appears  well-developed and well-nourished. No distress.  HENT:  Head: Normocephalic and atraumatic.  Mouth/Throat: Oropharynx is clear and moist. No oropharyngeal exudate.  erythematous oropharynx, uvula midline, no meningismus  Eyes: Conjunctivae and EOM are normal. Pupils are equal, round, and reactive to light.  Neck: Normal range of motion. Neck supple.  No meningismus.  Cardiovascular: Normal rate, regular rhythm, normal heart sounds and intact distal pulses.   No murmur heard. Pulmonary/Chest: Effort normal and breath sounds normal. No respiratory distress.  Abdominal: Soft. There is no tenderness. There is no rebound and no guarding.  obese  Musculoskeletal: Normal range of motion. He exhibits no edema or tenderness.  Neurological: He is alert and oriented to person, place, and time. No cranial nerve deficit. He exhibits normal muscle tone. Coordination normal.  No ataxia on finger to nose bilaterally. No pronator drift. 5/5 strength throughout. CN 2-12 intact.Equal grip strength. Sensation intact.   Skin: Skin is warm.  Psychiatric: He has a normal mood and affect. His behavior is normal.  Nursing note and vitals reviewed.   ED Course  Procedures  DIAGNOSTIC STUDIES: Oxygen Saturation is 99% on RA, normal by my interpretation.  COORDINATION OF CARE:  7:07 PM Discussed treatment plan which includes CT of head and chest x-ray with pt at bedside and pt agreed to plan.  Labs Review Labs Reviewed - No data to display  Imaging Review Dg Chest 2 View  03/13/2015  CLINICAL DATA:  Cough for 1 day.  Chills. EXAM: CHEST  2 VIEW COMPARISON:  Chest radiograph August 24, 2013; thoracic CT December 02, 2013 FINDINGS: The previously noted postoperative changes in the lower thoracic spine and adjacent lower thoracic ribs on the left are not well seen by radiography. There is no edema or consolidation. Heart size and pulmonary vascular normal. No adenopathy. IMPRESSION: Lungs clear. Previously documented  bony lesions in the lower thoracic region adjacent posterior left ribs at T7, T8, and T9 are not appreciable on this examination. Electronically Signed   By: Bretta Bang III M.D.   On: 03/13/2015 20:15   Ct Head Wo Contrast  03/13/2015  CLINICAL DATA:  50 year old male with cough and sinus congestion for 1 day. Headache overlying the right orbit. EXAM: CT HEAD WITHOUT CONTRAST TECHNIQUE: Contiguous axial images were obtained from the base of the skull through the vertex without intravenous contrast. COMPARISON:  Head CT 11/23/2008. FINDINGS: Patchy and confluent areas of decreased attenuation are noted throughout the deep and periventricular white matter of the cerebral hemispheres bilaterally, compatible with chronic microvascular ischemic disease. No acute intracranial abnormalities. Specifically, no evidence of acute intracranial hemorrhage, no definite findings of acute/subacute cerebral ischemia, no mass, mass effect, hydrocephalus or abnormal intra or extra-axial fluid collections. Visualized paranasal sinuses and mastoids are generally well pneumatized, with exception of some mild multifocal mucosal thickening in the ethmoid sinuses bilaterally. No acute displaced skull fractures  are identified. IMPRESSION: 1. No acute intracranial abnormalities. 2. Mild chronic microvascular ischemic changes in the cerebral white matter. 3. Mild mucosal thickening in the ethmoid sinuses bilaterally, without air-fluid level to suggest an acute sinusitis. Electronically Signed   By: Trudie Reed M.D.   On: 03/13/2015 19:52   I have personally reviewed and evaluated these images and lab results as part of my medical decision-making.   EKG Interpretation None      MDM   Final diagnoses:  Sinusitis, unspecified chronicity, unspecified location  Essential hypertension  flu like symptoms including cough, sneezing, runny nose, headache and body aches since yesterday. No chest pain or shortness of breath.  Severely hypotensive on arrival. States was taken off blood pressure medication last year. No chest pain or shortness of breath.  Given home BP meds.  BP improved to 184/110. CXR negative. CT head negative but does show sinus thickening.  Influenza like illness with sinus disease on CT. BP uncontrolled after being off meds for months, likely contributing to headache. Restart BP meds, supportive care for viral illness, treat sinusitis. Encouraged to follow up with PCP this week and move his appointment from March. Return precautions discussed.  BP 184/112 mmHg  Pulse 85  Temp(Src) 98.3 F (36.8 C) (Oral)  Resp 18  Ht  (1.702 m)  Wt 250 lb (113.399 kg)  BMI 39.15 kg/m2  SpO2 98%    I personally performed the services described in this documentation, which was scribed in my presence. The recorded information has been reviewed and is accurate.   James Octave, MD 03/14/15 [phone]

## 2015-03-13 NOTE — ED Notes (Signed)
Coughing, sneezing, runny nose, headache, body aches, and chills, since yesterday.

## 2015-04-30 ENCOUNTER — Encounter (HOSPITAL_BASED_OUTPATIENT_CLINIC_OR_DEPARTMENT_OTHER): Payer: Self-pay

## 2015-04-30 ENCOUNTER — Emergency Department (HOSPITAL_BASED_OUTPATIENT_CLINIC_OR_DEPARTMENT_OTHER)
Admission: EM | Admit: 2015-04-30 | Discharge: 2015-04-30 | Disposition: A | Discharge status: Home / Self Care | Payer: BLUE CROSS/BLUE SHIELD | Attending: Emergency Medicine | Admitting: Emergency Medicine

## 2015-04-30 DIAGNOSIS — R05 Cough: Secondary | ICD-10-CM | POA: Diagnosis present

## 2015-04-30 DIAGNOSIS — M109 Gout, unspecified: Secondary | ICD-10-CM | POA: Insufficient documentation

## 2015-04-30 DIAGNOSIS — Z87442 Personal history of urinary calculi: Secondary | ICD-10-CM | POA: Diagnosis not present

## 2015-04-30 DIAGNOSIS — Z792 Long term (current) use of antibiotics: Secondary | ICD-10-CM | POA: Diagnosis not present

## 2015-04-30 DIAGNOSIS — F17210 Nicotine dependence, cigarettes, uncomplicated: Secondary | ICD-10-CM | POA: Diagnosis not present

## 2015-04-30 DIAGNOSIS — G4730 Sleep apnea, unspecified: Secondary | ICD-10-CM | POA: Insufficient documentation

## 2015-04-30 DIAGNOSIS — Z8639 Personal history of other endocrine, nutritional and metabolic disease: Secondary | ICD-10-CM | POA: Diagnosis not present

## 2015-04-30 DIAGNOSIS — Z9981 Dependence on supplemental oxygen: Secondary | ICD-10-CM | POA: Insufficient documentation

## 2015-04-30 DIAGNOSIS — J111 Influenza due to unidentified influenza virus with other respiratory manifestations: Secondary | ICD-10-CM | POA: Diagnosis not present

## 2015-04-30 DIAGNOSIS — Z79899 Other long term (current) drug therapy: Secondary | ICD-10-CM | POA: Diagnosis not present

## 2015-04-30 DIAGNOSIS — I10 Essential (primary) hypertension: Secondary | ICD-10-CM | POA: Diagnosis not present

## 2015-04-30 DIAGNOSIS — Z8659 Personal history of other mental and behavioral disorders: Secondary | ICD-10-CM | POA: Insufficient documentation

## 2015-04-30 DIAGNOSIS — Z791 Long term (current) use of non-steroidal anti-inflammatories (NSAID): Secondary | ICD-10-CM | POA: Diagnosis not present

## 2015-04-30 DIAGNOSIS — M1990 Unspecified osteoarthritis, unspecified site: Secondary | ICD-10-CM | POA: Diagnosis not present

## 2015-04-30 LAB — RAPID STREP SCREEN (MED CTR MEBANE ONLY): Streptococcus, Group A Screen (Direct): NEGATIVE

## 2015-04-30 MED ORDER — ACETAMINOPHEN 500 MG PO TABS
1000.0000 mg | ORAL_TABLET | Freq: Once | ORAL | Status: AC
  Administered 2015-04-30: 1000 mg via ORAL
  Filled 2015-04-30: qty 2

## 2015-04-30 MED ORDER — OSELTAMIVIR PHOSPHATE 75 MG PO CAPS: 75.0000 mg | ORAL_CAPSULE | Freq: Two times a day (BID) | ORAL | Status: DC

## 2015-04-30 MED FILL — OSELTAMIVIR PHOS 75 MG CAP: 75 | 5 days supply | Qty: 10 | Fill #0

## 2015-04-30 NOTE — ED Notes (Signed)
Pt reports 2 days of flu symptoms, cough, congestion, body aches and sore throat.  Family members sick with same.

## 2015-04-30 NOTE — Discharge Instructions (Signed)

## 2015-04-30 NOTE — ED Provider Notes (Signed)
CSN: [id number]     Arrival date & time 04/30/15  1156 History  By signing my name below, I, Budd Palmer, attest that this documentation has been prepared under the direction and in the presence of Nelva Nay, MD. Electronically Signed: Budd Palmer, ED Scribe. 04/30/2015. 4:05 PM.    Chief Complaint  Patient presents with  . URI   The history is provided by the patient. No language interpreter was used.   HPI Comments: James Downs is a 50 y.o. male smoker at 1 ppd with a PMHx of HTN and hypercholesteremia who presents to the Emergency Department complaining of a URI onset 2 days ago. He reports associated scratchy throat (not painful), productive cough, fever, congestion, and generalized myalgias. He states he was seen in the ED 1 month ago when he was diagnosed with a sinus infection and treated with antibiotics. He has since finished taking the medication and states he felt back to normal until these new symptoms began. He reports recent sick contacts with bronchitis (wife, smoker) and PNA (mother).   Past Medical History  Diagnosis Date  . Depression   . Gout   . Hypercholesterolemia   . Kidney stone   . Hypertension     on meds  . Sleep apnea     uses CPAP  . Arthritis    Past Surgical History  Procedure Laterality Date  . Hand surgery Right 1985  . Foot surgery Right 1991  . Posterior lumbar fusion 4 level N/A 11/29/2013    Procedure: Thoracic Eight, Thoracic Nine, Costotransversectomy   Thoracic Seven-Eight Thoracic Eight-Nine diskectomy;  Surgeon: Lisbeth Renshaw, MD;  Location: [facility];  Service: Neurosurgery;  Laterality: N/A;  Thoracic Eight, Thoracic Nine, Costotransversectomy   Thoracic Seven-Eight Thoracic Eight-Nine diskectomy   Family History  Problem Relation Age of Onset  . Cancer Father     BRAIN/PROSTATE/SKIN  . Cancer Paternal Grandfather   . Heart failure Maternal Grandmother   . Diabetes Maternal Grandmother   . Hypertension Father    . Hypertension Maternal Grandmother   . Hypertension Maternal Grandfather    Social History  Substance Use Topics  . Smoking status: Current Every Day Smoker -- 1.00 packs/day for 30 years    Types: Cigarettes  . Smokeless tobacco: Former Neurosurgeon    Types: Snuff, Chew  . Alcohol Use: No    Review of Systems  Constitutional: Positive for fever.  HENT: Positive for congestion and sore throat ("scratchy").   Respiratory: Positive for cough.   Musculoskeletal: Positive for myalgias.  All other systems reviewed and are negative.   Allergies  Review of patient's allergies indicates no known allergies.  Home Medications   Prior to Admission medications   Medication Sig Start Date End Date Taking? Authorizing Provider  docusate calcium (SURFAK) 240 MG capsule Take 240 mg by mouth daily.   Yes Historical Provider, MD  amoxicillin (AMOXIL) 500 MG capsule Take 2 capsules (1,000 mg total) by mouth 3 (three) times daily. 03/13/15   [name], MD  diphenoxylate-atropine (LOMOTIL) 2.5-0.025 MG tablet Take 1 tablet by mouth 4 (four) times daily as needed for diarrhea or loose stools. 11/29/14   Rolland Porter, MD  fexofenadine-pseudoephedrine (ALLEGRA-D 24) 180-240 MG per 24 hr tablet Take 1 tablet by mouth daily.    Historical Provider, MD  naproxen sodium (ANAPROX) 220 MG tablet Take 440 mg by mouth 2 (two) times daily with a meal.     Historical Provider, MD  ondansetron (ZOFRAN ODT) 4  MG disintegrating tablet Take 1 tablet (4 mg total) by mouth every 8 (eight) hours as needed for nausea. 11/29/14   Rolland PorterMark James, MD  oseltamivir (TAMIFLU) 75 MG capsule Take 1 capsule (75 mg total) by mouth every 12 (twelve) hours. 04/30/15   Nelva Nayobert Beaton, MD  telmisartan-hydrochlorothiazide (MICARDIS HCT) 80-25 MG tablet Take 1 tablet by mouth daily. 03/13/15   Glynn OctaveStephen Rancour, MD   BP 134/83 mmHg  Pulse 99  Temp(Src) 101.4 F (38.6 C) (Oral)  Resp 22  Ht 5\' 7"  (1.702 m)  Wt 230 lb (104.327 kg)  BMI 36.01  kg/m2  SpO2 97% Physical Exam  Constitutional: He is oriented to person, place, and time. He appears well-developed and well-nourished. No distress.  HENT:  Head: Normocephalic and atraumatic.  Mouth/Throat: Uvula is midline. No oropharyngeal exudate or posterior oropharyngeal edema.  Eyes: Pupils are equal, round, and reactive to light.  Neck: Normal range of motion.  Cardiovascular: Normal rate and intact distal pulses.   Pulmonary/Chest: Effort normal and breath sounds normal. No respiratory distress. He has no wheezes. He has no rales.  Abdominal: Normal appearance. He exhibits no distension.  Musculoskeletal: Normal range of motion.  Neurological: He is alert and oriented to person, place, and time. No cranial nerve deficit.  Skin: Skin is warm and dry. No rash noted.  Psychiatric: He has a normal mood and affect. His behavior is normal.  Nursing note and vitals reviewed.   ED Course  Procedures  DIAGNOSTIC STUDIES: Oxygen Saturation is 97% on RA, normal by my interpretation.    COORDINATION OF CARE: 4:01 PM - Discussed plans to order diagnostic studies. Pt advised of plan for treatment and pt agrees.  Labs Review Labs Reviewed  [facility])  CULTURE, GROUP A STREP [facility])    Imaging Review No results found. I have personally reviewed and evaluated these images and lab results as part of my medical decision-making.    MDM   Final diagnoses:  Influenza    I personally performed the services described in this documentation, which was scribed in my presence. The recorded information has been reviewed and considered.  Nelva Nayobert Beaton, MD 04/30/15 [phone]

## 2015-05-03 LAB — CULTURE, GROUP A STREP (THRC)

## 2015-09-18 DIAGNOSIS — Z0001 Encounter for general adult medical examination with abnormal findings: Secondary | ICD-10-CM | POA: Diagnosis not present

## 2015-09-25 DIAGNOSIS — Z0001 Encounter for general adult medical examination with abnormal findings: Secondary | ICD-10-CM | POA: Diagnosis not present

## 2015-09-25 DIAGNOSIS — M109 Gout, unspecified: Secondary | ICD-10-CM | POA: Diagnosis not present

## 2015-09-25 DIAGNOSIS — I129 Hypertensive chronic kidney disease with stage 1 through stage 4 chronic kidney disease, or unspecified chronic kidney disease: Secondary | ICD-10-CM | POA: Diagnosis not present

## 2015-09-25 DIAGNOSIS — E039 Hypothyroidism, unspecified: Secondary | ICD-10-CM | POA: Diagnosis not present

## 2015-09-25 DIAGNOSIS — E785 Hyperlipidemia, unspecified: Secondary | ICD-10-CM | POA: Diagnosis not present

## 2015-10-08 DIAGNOSIS — Z1211 Encounter for screening for malignant neoplasm of colon: Secondary | ICD-10-CM | POA: Diagnosis not present

## 2016-01-07 ENCOUNTER — Other Ambulatory Visit: Payer: Self-pay | Admitting: Occupational Medicine

## 2016-01-07 ENCOUNTER — Ambulatory Visit: Payer: Self-pay

## 2016-01-07 DIAGNOSIS — Z0000 Encounter for general adult medical examination without abnormal findings: Secondary | ICD-10-CM

## 2016-01-09 DIAGNOSIS — I129 Hypertensive chronic kidney disease with stage 1 through stage 4 chronic kidney disease, or unspecified chronic kidney disease: Secondary | ICD-10-CM | POA: Diagnosis not present

## 2016-01-09 DIAGNOSIS — K645 Perianal venous thrombosis: Secondary | ICD-10-CM | POA: Diagnosis not present

## 2016-01-09 DIAGNOSIS — I10 Essential (primary) hypertension: Secondary | ICD-10-CM | POA: Diagnosis not present

## 2016-01-09 DIAGNOSIS — K625 Hemorrhage of anus and rectum: Secondary | ICD-10-CM | POA: Diagnosis not present

## 2016-01-14 DIAGNOSIS — K625 Hemorrhage of anus and rectum: Secondary | ICD-10-CM | POA: Diagnosis not present

## 2016-01-14 DIAGNOSIS — I10 Essential (primary) hypertension: Secondary | ICD-10-CM | POA: Diagnosis not present

## 2016-01-14 DIAGNOSIS — R5383 Other fatigue: Secondary | ICD-10-CM | POA: Diagnosis not present

## 2016-01-29 DIAGNOSIS — K635 Polyp of colon: Secondary | ICD-10-CM | POA: Diagnosis not present

## 2016-01-29 DIAGNOSIS — D124 Benign neoplasm of descending colon: Secondary | ICD-10-CM | POA: Diagnosis not present

## 2016-01-29 DIAGNOSIS — K5520 Angiodysplasia of colon without hemorrhage: Secondary | ICD-10-CM | POA: Diagnosis not present

## 2016-01-29 DIAGNOSIS — Z1211 Encounter for screening for malignant neoplasm of colon: Secondary | ICD-10-CM | POA: Diagnosis not present

## 2016-02-06 DIAGNOSIS — I10 Essential (primary) hypertension: Secondary | ICD-10-CM | POA: Diagnosis not present

## 2016-02-06 DIAGNOSIS — I129 Hypertensive chronic kidney disease with stage 1 through stage 4 chronic kidney disease, or unspecified chronic kidney disease: Secondary | ICD-10-CM | POA: Diagnosis not present

## 2016-02-06 DIAGNOSIS — K625 Hemorrhage of anus and rectum: Secondary | ICD-10-CM | POA: Diagnosis not present

## 2016-02-06 DIAGNOSIS — F339 Major depressive disorder, recurrent, unspecified: Secondary | ICD-10-CM | POA: Diagnosis not present

## 2016-02-07 ENCOUNTER — Other Ambulatory Visit: Payer: Self-pay | Admitting: Internal Medicine

## 2016-02-07 DIAGNOSIS — R945 Abnormal results of liver function studies: Secondary | ICD-10-CM

## 2016-02-15 ENCOUNTER — Other Ambulatory Visit: Payer: Self-pay

## 2016-03-01 DIAGNOSIS — Z7984 Long term (current) use of oral hypoglycemic drugs: Secondary | ICD-10-CM | POA: Diagnosis not present

## 2016-03-01 DIAGNOSIS — F17210 Nicotine dependence, cigarettes, uncomplicated: Secondary | ICD-10-CM | POA: Diagnosis not present

## 2016-03-01 DIAGNOSIS — M533 Sacrococcygeal disorders, not elsewhere classified: Secondary | ICD-10-CM | POA: Diagnosis not present

## 2016-03-01 DIAGNOSIS — I10 Essential (primary) hypertension: Secondary | ICD-10-CM | POA: Diagnosis not present

## 2016-03-01 DIAGNOSIS — E119 Type 2 diabetes mellitus without complications: Secondary | ICD-10-CM | POA: Diagnosis not present

## 2016-03-03 DIAGNOSIS — I6930 Unspecified sequelae of cerebral infarction: Secondary | ICD-10-CM | POA: Diagnosis not present

## 2016-03-31 DIAGNOSIS — I6930 Unspecified sequelae of cerebral infarction: Secondary | ICD-10-CM | POA: Diagnosis not present

## 2016-04-29 ENCOUNTER — Encounter (HOSPITAL_BASED_OUTPATIENT_CLINIC_OR_DEPARTMENT_OTHER): Payer: Self-pay

## 2016-04-29 ENCOUNTER — Emergency Department (HOSPITAL_BASED_OUTPATIENT_CLINIC_OR_DEPARTMENT_OTHER)
Admission: EM | Admit: 2016-04-29 | Discharge: 2016-04-29 | Disposition: A | Discharge status: Home / Self Care | Payer: BLUE CROSS/BLUE SHIELD | Attending: Emergency Medicine | Admitting: Emergency Medicine

## 2016-04-29 ENCOUNTER — Emergency Department (HOSPITAL_BASED_OUTPATIENT_CLINIC_OR_DEPARTMENT_OTHER): Payer: BLUE CROSS/BLUE SHIELD

## 2016-04-29 DIAGNOSIS — R067 Sneezing: Secondary | ICD-10-CM | POA: Diagnosis not present

## 2016-04-29 DIAGNOSIS — I10 Essential (primary) hypertension: Secondary | ICD-10-CM | POA: Diagnosis not present

## 2016-04-29 DIAGNOSIS — Z7984 Long term (current) use of oral hypoglycemic drugs: Secondary | ICD-10-CM | POA: Insufficient documentation

## 2016-04-29 DIAGNOSIS — Z79899 Other long term (current) drug therapy: Secondary | ICD-10-CM | POA: Insufficient documentation

## 2016-04-29 DIAGNOSIS — M791 Myalgia: Secondary | ICD-10-CM | POA: Insufficient documentation

## 2016-04-29 DIAGNOSIS — R69 Illness, unspecified: Secondary | ICD-10-CM

## 2016-04-29 DIAGNOSIS — R6883 Chills (without fever): Secondary | ICD-10-CM | POA: Diagnosis not present

## 2016-04-29 DIAGNOSIS — J111 Influenza due to unidentified influenza virus with other respiratory manifestations: Secondary | ICD-10-CM

## 2016-04-29 DIAGNOSIS — F17210 Nicotine dependence, cigarettes, uncomplicated: Secondary | ICD-10-CM | POA: Diagnosis not present

## 2016-04-29 DIAGNOSIS — R05 Cough: Secondary | ICD-10-CM | POA: Diagnosis not present

## 2016-04-29 DIAGNOSIS — J3489 Other specified disorders of nose and nasal sinuses: Secondary | ICD-10-CM | POA: Diagnosis not present

## 2016-04-29 MED ORDER — BENZONATATE 100 MG PO CAPS: 100.0000 mg | ORAL_CAPSULE | Freq: Three times a day (TID) | ORAL | 0 refills | Status: DC

## 2016-04-29 MED ORDER — FLUTICASONE PROPIONATE 50 MCG/ACT NA SUSP: 2.0000 | Freq: Every day | NASAL | 12 refills | Status: DC

## 2016-04-29 NOTE — ED Provider Notes (Signed)
[facility] Provider Note   CSN: [id number] Arrival date & time: 04/29/16  1816   By signing my name below, I, Cynda Acres, attest that this documentation has been prepared under the direction and in the presence of Demetrios Loll, PA-C.  Electronically Signed: Cynda Acres, Scribe. 04/29/16. 7:01 PM.  History   Chief Complaint Chief Complaint  Patient presents with  . Cough    HPI Comments: ABDULLA POOLEY is a 51 y.o. male with a history of hypertension, gout, hyperlipidemia, and sleep apnea, who presents to the Emergency Department complaining of a gradual-onset, intermittent non-productive cough that began 3 days ago. Patient reports associated rhinorrhea, sneezing, sore throat, body aches, and chills. Patient reports taking dayquil with minimal improvement. Patient states he did not receive a flu shot this year. Patient is a tobacco user, one pack a day. Patient denies any recent sick contacts, cardiac problems, fever, nausea, vomiting,Nuchal rigidity, Chest pain, shortness breath, abdominal pain. Does report that he has 4 young grandchildren.  The history is provided by the patient. No language interpreter was used.  Cough  This is a new problem. Episode onset: 3 days ago. The problem has not changed since onset.The cough is non-productive. There has been no fever. Associated symptoms include chills, rhinorrhea and myalgias. Pertinent negatives include no ear pain and no sore throat. He is a smoker.    Past Medical History:  Diagnosis Date  . Arthritis   . Depression   . Gout   . Hypercholesterolemia   . Hypertension    on meds  . Kidney stone   . Sleep apnea    uses CPAP    Patient Active Problem List   Diagnosis Date Noted  . 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 Weakness of left leg 11/04/2013  . Paresthesias 11/04/2013  . Lumbar radiculopathy 11/04/2013  . Degenerative disc disease, lumbar 11/04/2013  .  ONYCHOMYCOSIS 12/23/2006  . HYPERLIPIDEMIA 12/23/2006  . ANXIETY DISORDER, GENERALIZED 12/23/2006  . TOBACCO ABUSE 12/23/2006  . SYNDROME, CARPAL TUNNEL 12/23/2006  . HYPERTENSION 12/23/2006  . NEPHROLITHIASIS, HX OF 12/23/2006    Past Surgical History:  Procedure Laterality Date  . FOOT SURGERY Right 1991  . HAND SURGERY Right 1985  . POSTERIOR LUMBAR FUSION 4 LEVEL N/A 11/29/2013   Procedure: Thoracic Eight, Thoracic Nine, Costotransversectomy   Thoracic Seven-Eight Thoracic Eight-Nine diskectomy;  Surgeon: Lisbeth Renshaw, MD;  Location: [facility];  Service: Neurosurgery;  Laterality: N/A;  Thoracic Eight, Thoracic Nine, Costotransversectomy   Thoracic Seven-Eight Thoracic Eight-Nine diskectomy       Home Medications    Prior to Admission medications   Medication Sig Start Date End Date Taking? Authorizing Provider  HYDRALAZINE-HCTZ PO Take by mouth.   Yes Historical Provider, MD  METFORMIN HCL PO Take by mouth.   Yes Historical Provider, MD  PARoxetine HCl (PAXIL PO) Take by mouth.   Yes Historical Provider, MD    Family History Family History  Problem Relation Age of Onset  . Cancer Father     BRAIN/PROSTATE/SKIN  . Hypertension Father   . Cancer Paternal Grandfather   . Heart failure Maternal Grandmother   . Diabetes Maternal Grandmother   . Hypertension Maternal Grandmother   . Hypertension Maternal Grandfather     Social History Social History  Substance Use Topics  . Smoking status: Current Every Day Smoker    Packs/day: 1.00    Years: 30.00    Types: Cigarettes  . Smokeless tobacco: Former  User  . Alcohol use No     Allergies   Patient has no known allergies.   Review of Systems Review of Systems  Constitutional: Positive for chills. Negative for fever.  HENT: Positive for rhinorrhea and sneezing. Negative for ear pain and sore throat.   Respiratory: Positive for cough.   Gastrointestinal: Negative for nausea and vomiting.    Musculoskeletal: Positive for myalgias.  All other systems reviewed and are negative.    Physical Exam Updated Vital Signs BP 115/73 (BP Location: Left Arm)   Pulse 87   Temp 98.5 F (36.9 C) (Oral)   Resp 16   Ht 5\' 8"  (1.727 m)   Wt 104.3 kg   SpO2 96%   BMI 34.97 kg/m   Physical Exam  Constitutional: He is oriented to person, place, and time. He appears well-developed and well-nourished. No distress.  HENT:  Head: Normocephalic and atraumatic.  Right Ear: Tympanic membrane, external ear and ear canal normal.  Left Ear: Tympanic membrane, external ear and ear canal normal.  Nose: Mucosal edema and rhinorrhea present.  Mouth/Throat: Uvula is midline and mucous membranes are normal. Posterior oropharyngeal erythema present. No oropharyngeal exudate or posterior oropharyngeal edema.  Eyes: Conjunctivae and EOM are normal. Pupils are equal, round, and reactive to light.  Neck: Normal range of motion. Neck supple.  No nuchal rigidity.  Cardiovascular: Normal rate, regular rhythm, normal heart sounds and intact distal pulses.  Exam reveals no gallop and no friction rub.   No murmur heard. Pulmonary/Chest: Effort normal and breath sounds normal. No respiratory distress. He has no wheezes. He has no rales. He exhibits no tenderness.  Patient is not hypoxic or tachypnea.  Abdominal: Soft. Bowel sounds are normal. There is no tenderness. There is no guarding.  Musculoskeletal: Normal range of motion.  Lymphadenopathy:    He has no cervical adenopathy.  Neurological: He is alert and oriented to person, place, and time.  Skin: Skin is warm and dry. Capillary refill takes less than 2 seconds.  Psychiatric: He has a normal mood and affect.  Nursing note and vitals reviewed.    ED Treatments / Results  DIAGNOSTIC STUDIES: Oxygen Saturation is 98% on RA, normal by my interpretation.    COORDINATION OF CARE: 7:01 PM Discussed treatment plan with pt at bedside and pt agreed to  plan, which includes anti-inflammatory pain medication.   Labs (all labs ordered are listed, but only abnormal results are displayed) Labs Reviewed - No data to display  EKG  EKG Interpretation None       Radiology Dg Chest 2 View  Result Date: 04/29/2016 CLINICAL DATA:  Productive cough beginning 3 days ago. EXAM: CHEST  2 VIEW COMPARISON:  01/07/2016 FINDINGS: The cardiomediastinal silhouette is within normal limits. The lungs are well inflated and clear. There is no evidence of pleural effusion or pneumothorax. No acute osseous abnormality is identified. IMPRESSION: No active cardiopulmonary disease. Electronically Signed   By: Sebastian Ache M.D.   On: 04/29/2016 19:28    Procedures Procedures (including critical care time)  Medications Ordered in ED Medications - No data to display   Initial Impression / Assessment and Plan / ED Course  I have reviewed the triage vital signs and the nursing notes.  Pertinent labs & imaging results that were available during my care of the patient were reviewed by me and considered in my medical decision making (see chart for details).     Patient presents to the ED with flulike symptoms  including cough, sore throat, nasal congestion, chest congestion, chills, body aches. Onset 3 days ago. Patient denies any fevers or reports chills. Took DayQuil prior to arrival today. Chest x-ray shows no focal infiltrate. Lungs are clear to auscultation bilaterally. Oropharynx with mild erythema but no signs of PTA or deep neck infection. Full range of motion of the neck and and concerning for meningitis. The patient's hypoxic or tachypneic. He is low risk for PE. Low suspicion for PE at this time. Denies any chest pain or shortness of breath. Will be treated with symptomatic therapy. Given cough suppressant and Flonase. Encouraged follow-up with PCP. Given strict return percussions. Patient verbalized understanding the plan of care. All questions answered prior  to discharge. Vital signs within normal limits. Ambulate with normal gait on discharge.  Final Clinical Impressions(s) / ED Diagnoses   Final diagnoses:  Influenza-like illness    New Prescriptions New Prescriptions   BENZONATATE (TESSALON) 100 MG CAPSULE    Take 1 capsule (100 mg total) by mouth every 8 (eight) hours.   FLUTICASONE (FLONASE) 50 MCG/ACT NASAL SPRAY    Place 2 sprays into both nostrils daily.   I personally performed the services described in this documentation, which was scribed in my presence. The recorded information has been reviewed and is accurate.     Rise MuKenneth T Leaphart, PA-C 04/29/16 1955    Tilden FossaElizabeth Rees, MD 05/02/16 [id number]

## 2016-04-29 NOTE — ED Triage Notes (Signed)
C/o flu like sx x 3 days-NAD-steady gait 

## 2016-04-29 NOTE — Discharge Instructions (Signed)
Chest x-ray shows no signs of pneumonia. Symptoms could be consistent with the flu. Please take Tessalon for cough. May use Flonase for nasal congestion. Motrin and Tylenol for pain and fever. Over-the-counter Mucinex for chest congestion. Follow up with her primary care doctor if symptoms not improved. Return if his symptoms worsen.

## 2016-06-01 ENCOUNTER — Emergency Department (HOSPITAL_BASED_OUTPATIENT_CLINIC_OR_DEPARTMENT_OTHER)
Admission: EM | Admit: 2016-06-01 | Discharge: 2016-06-01 | Disposition: A | Discharge status: Home / Self Care | Payer: BLUE CROSS/BLUE SHIELD | Attending: Emergency Medicine | Admitting: Emergency Medicine

## 2016-06-01 ENCOUNTER — Emergency Department (HOSPITAL_BASED_OUTPATIENT_CLINIC_OR_DEPARTMENT_OTHER): Payer: BLUE CROSS/BLUE SHIELD

## 2016-06-01 ENCOUNTER — Encounter (HOSPITAL_BASED_OUTPATIENT_CLINIC_OR_DEPARTMENT_OTHER): Payer: Self-pay | Admitting: *Deleted

## 2016-06-01 DIAGNOSIS — Z79899 Other long term (current) drug therapy: Secondary | ICD-10-CM | POA: Diagnosis not present

## 2016-06-01 DIAGNOSIS — M545 Low back pain: Secondary | ICD-10-CM | POA: Diagnosis not present

## 2016-06-01 DIAGNOSIS — F17210 Nicotine dependence, cigarettes, uncomplicated: Secondary | ICD-10-CM | POA: Insufficient documentation

## 2016-06-01 DIAGNOSIS — M5442 Lumbago with sciatica, left side: Secondary | ICD-10-CM | POA: Diagnosis not present

## 2016-06-01 DIAGNOSIS — I10 Essential (primary) hypertension: Secondary | ICD-10-CM | POA: Diagnosis not present

## 2016-06-01 MED ORDER — KETOROLAC TROMETHAMINE 60 MG/2ML IM SOLN
30.0000 mg | Freq: Once | INTRAMUSCULAR | Status: AC
  Administered 2016-06-01: 30 mg via INTRAMUSCULAR
  Filled 2016-06-01: qty 2

## 2016-06-01 NOTE — ED Triage Notes (Signed)
Pt reports waking up with back pain today (mostly on L side) -- hx of back surgery. Denies fever, urinary symptoms, n/v/d. Reports taking Aleve with minimal effectiveness.

## 2016-06-01 NOTE — ED Notes (Signed)
Pt given d/c instructions as per chart. Verbalizes understanding. No questions. 

## 2016-06-01 NOTE — Discharge Instructions (Signed)
Please take ibuprofen or naproxen as needed for pain. Please use warm compress to the area. Please use stretching and massaging to low back. Please follow up with her primary care provider and 1-2 weeks regarding today's visit. Please also start incorporating low back exercises.  IMPRESSION:  Mild degenerative changes of the lumbar spine.     Aortic atherosclerosis.   Get help right away if: You develop new bowel or bladder control problems. You have unusual weakness or numbness in your arms or legs. You develop nausea or vomiting. You develop abdominal pain. You feel faint.

## 2016-06-01 NOTE — ED Provider Notes (Signed)
[facility]   CSN: [id number] Arrival date & time: 06/01/16  1545  By signing my name below, I, Bridgette Habermann, attest that this documentation has been prepared under the direction and in the presence of Francisco Espina, [place]. Electronically Signed: Bridgette Habermann, ED Scribe. 06/01/16. 4:32 PM.  History   Chief Complaint Chief Complaint  Patient presents with  . Back Pain    HPI The history is provided by the patient. No language interpreter was used.   HPI Comments: James Downs is a 51 y.o. male with h/o arthritis, gout, tobacco abuse, DDD, HLD, and HTN, who presents to the Emergency Department complaining of gradually worsening, constant lower back pain radiating to his leg leg beginning this morning. He rates his current pain is a 9/10 and states it is aching in quality. Pt states he woke up with his symptoms, no recent injury or trauma. He notes his pain is exacerbated with walking and standing. Pt has taken Aleve with no relief to his symptoms. Per pt, he had back surgery two years ago; however, he states he did not have pain prior to this surgery. No h/o similar symptoms. No h/o IV drug use or cancer. Pt denies fever, chills, abdominal pain, nausea, vomiting, diarrhea, dysuria, urinary/bowel incontinence, numbness, paresthesias, or any other associated symptoms.  PCP: Thayer Headings, MD  Past Medical History:  Diagnosis Date  . Arthritis   . Depression   . Gout   . Hypercholesterolemia   . Hypertension    on meds  . Kidney stone   . Sleep apnea    uses CPAP    Patient Active Problem List   Diagnosis Date Noted  . 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 Weakness of left leg 11/04/2013  . Paresthesias 11/04/2013  . Lumbar radiculopathy 11/04/2013  . Degenerative disc disease, lumbar 11/04/2013  . ONYCHOMYCOSIS 12/23/2006  . HYPERLIPIDEMIA 12/23/2006  . ANXIETY DISORDER, GENERALIZED 12/23/2006  . TOBACCO  ABUSE 12/23/2006  . SYNDROME, CARPAL TUNNEL 12/23/2006  . HYPERTENSION 12/23/2006  . NEPHROLITHIASIS, HX OF 12/23/2006    Past Surgical History:  Procedure Laterality Date  . FOOT SURGERY Right 1991  . HAND SURGERY Right 1985  . POSTERIOR LUMBAR FUSION 4 LEVEL N/A 11/29/2013   Procedure: Thoracic Eight, Thoracic Nine, Costotransversectomy   Thoracic Seven-Eight Thoracic Eight-Nine diskectomy;  Surgeon: Lisbeth Renshaw, MD;  Location: [facility];  Service: Neurosurgery;  Laterality: N/A;  Thoracic Eight, Thoracic Nine, Costotransversectomy   Thoracic Seven-Eight Thoracic Eight-Nine diskectomy       Home Medications    Prior to Admission medications   Medication Sig Start Date End Date Taking? Authorizing Provider  Fexofenadine HCl (ALLEGRA ALLERGY PO) Take by mouth.   Yes Historical Provider, MD  HYDRALAZINE-HCTZ PO Take by mouth.   Yes Historical Provider, MD  PARoxetine HCl (PAXIL PO) Take by mouth.   Yes Historical Provider, MD  benzonatate (TESSALON) 100 MG capsule Take 1 capsule (100 mg total) by mouth every 8 (eight) hours. 04/29/16   Rise Mu, PA-C  fluticasone (FLONASE) 50 MCG/ACT nasal spray Place 2 sprays into both nostrils daily. 04/29/16   Rise Mu, PA-C    Family History Family History  Problem Relation Age of Onset  . Cancer Father     BRAIN/PROSTATE/SKIN  . Hypertension Father   . Cancer Paternal Grandfather   . Heart failure Maternal Grandmother   . Diabetes Maternal Grandmother   . Hypertension Maternal Grandmother   . Hypertension  Maternal Grandfather     Social History Social History  Substance Use Topics  . Smoking status: Current Every Day Smoker    Packs/day: 1.00    Years: 30.00    Types: Cigarettes  . Smokeless tobacco: Former Neurosurgeon  . Alcohol use No     Allergies   Patient has no known allergies.   Review of Systems Review of Systems  Constitutional: Negative for chills and fever.  Gastrointestinal: Negative for  abdominal pain, diarrhea, nausea and vomiting.  Genitourinary: Negative for difficulty urinating and dysuria.  Musculoskeletal: Positive for arthralgias, back pain and myalgias.  Neurological: Negative for numbness.  All other systems reviewed and are negative.  Physical Exam Updated Vital Signs BP (!) 134/92 (BP Location: Right Arm)   Pulse 96   Temp 98.1 F (36.7 C) (Oral)   Resp (!) 21   Ht  (1.727 m)   Wt 108.9 kg   SpO2 100%   BMI 36.49 kg/m   Physical Exam  Constitutional: He is oriented to person, place, and time. He appears well-developed and well-nourished.  Well appearing  HENT:  Head: Normocephalic and atraumatic.  Nose: Nose normal.  Mouth/Throat: Oropharynx is clear and moist.  Eyes: EOM are normal. Pupils are equal, round, and reactive to light.  Neck: Normal range of motion.  Normal ROM, no neck tenderness. No nuchal rigidity  Cardiovascular: Normal rate, normal heart sounds and intact distal pulses.   Pulmonary/Chest: Effort normal and breath sounds normal. No respiratory distress.  Normal work of breathing  Abdominal: Soft. There is no tenderness. There is no rebound and no guarding.  Soft and nontender. No rebound or guarding. No pulsatile mass noted.   Musculoskeletal: He exhibits tenderness. He exhibits no deformity.  There is tenderness to midline lumbar spine. No midline cervical, thoracic spine tenderness. Good ROM of spine. No deformity. No obvious wound, redness, or swelling noted.   Neurological: He is alert and oriented to person, place, and time.  Cranial Nerves:  III,IV, VI: ptosis not present, extra-ocular movements intact bilaterally, direct and consensual pupillary light reflexes intact bilaterally V: facial sensation, jaw opening, and bite strength equal bilaterally VII: eyebrow raise, eyelid close, smile, frown, pucker equal bilaterally VIII: hearing grossly normal bilaterally  IX,X: palate elevation and swallowing intact XI: bilateral  shoulder shrug and lateral head rotation equal and strong XII: midline tongue extension  Negative pronator drift, negative Romberg, negative RAM's, negative heel-to-shin, negative finger to nose.    Sensory intact.  Muscle strength 5/5 Patient able to ambulate without difficulty.   SLR Right -  Negative SLR Left -  Positive  Skin: Skin is warm.  Psychiatric: He has a normal mood and affect. His behavior is normal.  Nursing note and vitals reviewed.    ED Treatments / Results  DIAGNOSTIC STUDIES: Oxygen Saturation is 100% on RA, normal by my interpretation.   COORDINATION OF CARE: 4:32 PM-Discussed next steps with pt. Pt verbalized understanding and is agreeable with the plan.   Labs (all labs ordered are listed, but only abnormal results are displayed) Labs Reviewed - No data to display  EKG  EKG Interpretation None       Radiology Dg Lumbar Spine Complete  Result Date: 06/01/2016 CLINICAL DATA:  Low back pain, no known injury EXAM: LUMBAR SPINE - COMPLETE 4+ VIEW COMPARISON:  None. FINDINGS: Five lumbar-type vertebral bodies. Normal lumbar lordosis. No evidence of fracture or dislocation. Vertebral body heights are maintained. Mild multilevel degenerative changes. Visualized bony pelvis  appears intact. Aortic atherosclerosis. IMPRESSION: Mild degenerative changes of the lumbar spine. Aortic atherosclerosis. Electronically Signed   By: Charline Bills M.D.   On: 06/01/2016 17:06    Procedures Procedures (including critical care time)  Medications Ordered in ED Medications  ketorolac (TORADOL) injection 30 mg (30 mg Intramuscular Given 06/01/16 1702)     Initial Impression / Assessment and Plan / ED Course  I have reviewed the triage vital signs and the nursing notes.  Pertinent labs & imaging results that were available during my care of the patient were reviewed by me and considered in my medical decision making (see chart for details).    Patient with back  pain.  No neurological deficits and normal neuro exam. Patient is ambulatory. No loss of bowel or bladder control. No concern for cauda equina.  No fever, night sweats, weight loss, h/o cancer, IVDA, no recent procedure to back. No urinary symptoms suggestive of UTI. Supportive care and return precaution discussed. Appears safe for discharge at this time. Follow up as indicated in discharge paperwork.   Final Clinical Impressions(s) / ED Diagnoses   Final diagnoses:  Acute midline low back pain with left-sided sciatica     New Prescriptions New Prescriptions   No medications on file   I personally performed the services described in this documentation, which was scribed in my presence. The recorded information has been reviewed and is accurate.    [address] [place], [place] 06/01/16 1717    Rolan Bucco, MD 06/01/16 1800

## 2016-06-01 NOTE — ED Notes (Signed)
Pt c/o lower left back pain since waking this morning.  Pt states pain is more debilitating when walking but that no position "eases off the pain."  Pt denies n/v/urinary symptoms or known injury.  Pt has taken Aleve with no relief.

## 2016-06-03 DIAGNOSIS — M545 Low back pain: Secondary | ICD-10-CM | POA: Diagnosis not present

## 2016-07-30 DIAGNOSIS — G4733 Obstructive sleep apnea (adult) (pediatric): Secondary | ICD-10-CM | POA: Diagnosis not present

## 2016-08-19 ENCOUNTER — Emergency Department (HOSPITAL_BASED_OUTPATIENT_CLINIC_OR_DEPARTMENT_OTHER)
Admission: EM | Admit: 2016-08-19 | Discharge: 2016-08-19 | Disposition: A | Discharge status: Home / Self Care | Payer: BLUE CROSS/BLUE SHIELD | Attending: Emergency Medicine | Admitting: Emergency Medicine

## 2016-08-19 ENCOUNTER — Encounter (HOSPITAL_BASED_OUTPATIENT_CLINIC_OR_DEPARTMENT_OTHER): Payer: Self-pay | Admitting: *Deleted

## 2016-08-19 DIAGNOSIS — I10 Essential (primary) hypertension: Secondary | ICD-10-CM | POA: Diagnosis not present

## 2016-08-19 DIAGNOSIS — Z79899 Other long term (current) drug therapy: Secondary | ICD-10-CM | POA: Diagnosis not present

## 2016-08-19 DIAGNOSIS — J020 Streptococcal pharyngitis: Secondary | ICD-10-CM | POA: Insufficient documentation

## 2016-08-19 DIAGNOSIS — F17210 Nicotine dependence, cigarettes, uncomplicated: Secondary | ICD-10-CM | POA: Insufficient documentation

## 2016-08-19 DIAGNOSIS — R51 Headache: Secondary | ICD-10-CM | POA: Diagnosis present

## 2016-08-19 LAB — RAPID STREP SCREEN (MED CTR MEBANE ONLY): Streptococcus, Group A Screen (Direct): POSITIVE — AB

## 2016-08-19 MED ORDER — AMOXICILLIN 500 MG PO CAPS: 1000.0000 mg | ORAL_CAPSULE | Freq: Every day | ORAL | 0 refills | Status: DC

## 2016-08-19 MED FILL — AMOXICILLIN 500 MG CAPSULE: 500 | 10 days supply | Qty: 20 | Fill #0

## 2016-08-19 NOTE — ED Triage Notes (Addendum)
Pt c/o flu like symptoms x 2 days n/v and h/a  also

## 2016-08-19 NOTE — Discharge Instructions (Signed)
Read the information below.  Use the prescribed medication as directed.  Please discuss all new medications with your pharmacist.  You may return to the Emergency Department at any time for worsening condition or any new symptoms that concern you.   If you develop high fevers, difficulty swallowing or breathing, or you are unable to tolerate fluids by mouth, return to the ER immediately for a recheck.    °

## 2016-08-19 NOTE — ED Notes (Signed)
States 3 of grandchildren have strep .

## 2016-08-19 NOTE — ED Provider Notes (Signed)
[facility] Provider Note   CSN: [id number] Arrival date & time: 08/19/16  1437     History   Chief Complaint Chief Complaint  Patient presents with  . Generalized Body Aches    HPI James Downs is a 51 y.o. male.  HPI   Pt p/w headache, nasal congestion, scratchy throat, mild cough, N/V x 2, sweats, generalized weakness/shaking that began last night.  Was around his grandchildren this weekend and they are all now being treated for strep.  Denies abdominal pain, ear pain, SOB, CP.    Past Medical History:  Diagnosis Date  . Arthritis   . Depression   . Gout   . Hypercholesterolemia   . Hypertension    on meds  . Kidney stone   . Sleep apnea    uses CPAP    Patient Active Problem List   Diagnosis Date Noted  . 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 Weakness of left leg 11/04/2013  . Paresthesias 11/04/2013  . Lumbar radiculopathy 11/04/2013  . Degenerative disc disease, lumbar 11/04/2013  . ONYCHOMYCOSIS 12/23/2006  . HYPERLIPIDEMIA 12/23/2006  . ANXIETY DISORDER, GENERALIZED 12/23/2006  . TOBACCO ABUSE 12/23/2006  . SYNDROME, CARPAL TUNNEL 12/23/2006  . HYPERTENSION 12/23/2006  . NEPHROLITHIASIS, HX OF 12/23/2006    Past Surgical History:  Procedure Laterality Date  . FOOT SURGERY Right 1991  . HAND SURGERY Right 1985  . POSTERIOR LUMBAR FUSION 4 LEVEL N/A 11/29/2013   Procedure: Thoracic Eight, Thoracic Nine, Costotransversectomy   Thoracic Seven-Eight Thoracic Eight-Nine diskectomy;  Surgeon: Lisbeth Renshaw, MD;  Location: [facility];  Service: Neurosurgery;  Laterality: N/A;  Thoracic Eight, Thoracic Nine, Costotransversectomy   Thoracic Seven-Eight Thoracic Eight-Nine diskectomy       Home Medications    Prior to Admission medications   Medication Sig Start Date End Date Taking? Authorizing Provider  amoxicillin (AMOXIL) 500 MG capsule Take 2 capsules (1,000 mg total) by mouth daily.  08/19/16   Trixie Dredge, PA-C  benzonatate (TESSALON) 100 MG capsule Take 1 capsule (100 mg total) by mouth every 8 (eight) hours. 04/29/16   Rise Mu, PA-C  Fexofenadine HCl (ALLEGRA ALLERGY PO) Take by mouth.    [provider]  fluticasone (FLONASE) 50 MCG/ACT nasal spray Place 2 sprays into both nostrils daily. 04/29/16   Rise Mu, PA-C  HYDRALAZINE-HCTZ PO Take by mouth.    [provider]  PARoxetine HCl (PAXIL PO) Take by mouth.    [provider]    Family History Family History  Problem Relation Age of Onset  . Cancer Father        BRAIN/PROSTATE/SKIN  . Hypertension Father   . Cancer Paternal Grandfather   . Heart failure Maternal Grandmother   . Diabetes Maternal Grandmother   . Hypertension Maternal Grandmother   . Hypertension Maternal Grandfather     Social History Social History  Substance Use Topics  . Smoking status: Current Every Day Smoker    Packs/day: 1.00    Years: 30.00    Types: Cigarettes  . Smokeless tobacco: Former Neurosurgeon  . Alcohol use No     Allergies   Patient has no known allergies.   Review of Systems Review of Systems  All other systems reviewed and are negative.    Physical Exam Updated Vital Signs BP 124/82 (BP Location: Right Arm)   Pulse 96   Temp 98.2 F (36.8 C) (Oral)   Resp 18   Ht 5'  9" (1.753 m)   Wt 102.1 kg (225 lb)   SpO2 98%   BMI 33.23 kg/m   Physical Exam  Constitutional: He appears well-developed and well-nourished. No distress.  HENT:  Head: Normocephalic and atraumatic.  Mouth/Throat: Mucous membranes are normal. Posterior oropharyngeal erythema present. No oropharyngeal exudate, posterior oropharyngeal edema or tonsillar abscesses.  Eyes: Conjunctivae and EOM are normal. Right eye exhibits no discharge. Left eye exhibits no discharge.  Neck: Normal range of motion. Neck supple.  Cardiovascular: Normal rate and regular rhythm.   Pulmonary/Chest: Effort normal  and breath sounds normal. No stridor. No respiratory distress. He has no wheezes. He has no rales.  Lymphadenopathy:    He has no cervical adenopathy.  Neurological: He is alert.  Skin: He is not diaphoretic.  Nursing note and vitals reviewed.    ED Treatments / Results  Labs (all labs ordered are listed, but only abnormal results are displayed) Labs Reviewed  RAPID STREP SCREEN (NOT [facility]) - Abnormal; Notable for the following:       Result Value   Streptococcus, Group A Screen (Direct) POSITIVE (*)    All other components within normal limits    EKG  EKG Interpretation None       Radiology No results found.  Procedures Procedures (including critical care time)  Medications Ordered in ED Medications - No data to display   Initial Impression / Assessment and Plan / ED Course  I have reviewed the triage vital signs and the nursing notes.  Pertinent labs & imaging results that were available during my care of the patient were reviewed by me and considered in my medical decision making (see chart for details).     Afebrile, nontoxic patient with sore throat.  Strep screen positive.  No airway concerns.  Doubt peritonsillar abscess.   D/C home with antibiotics, pain medication, PCP follow up.  Discussed result, findings, treatment, and follow up  with patient.  Pt given return precautions.  Pt verbalizes understanding and agrees with plan.       Final Clinical Impressions(s) / ED Diagnoses   Final diagnoses:  Strep pharyngitis    New Prescriptions Discharge Medication List as of 08/19/2016  3:31 PM    START taking these medications   Details  amoxicillin (AMOXIL) 500 MG capsule Take 2 capsules (1,000 mg total) by mouth daily., Starting Tue 08/19/2016, Print         Trixie DredgeWest, Emily, PA-C 08/19/16 1604    Tilden Fossaees, Elizabeth, MD 08/20/16 [phone]

## 2016-08-29 DIAGNOSIS — G4733 Obstructive sleep apnea (adult) (pediatric): Secondary | ICD-10-CM | POA: Diagnosis not present

## 2016-09-29 DIAGNOSIS — G4733 Obstructive sleep apnea (adult) (pediatric): Secondary | ICD-10-CM | POA: Diagnosis not present

## 2017-01-06 ENCOUNTER — Encounter (HOSPITAL_BASED_OUTPATIENT_CLINIC_OR_DEPARTMENT_OTHER): Payer: Self-pay | Admitting: Emergency Medicine

## 2017-01-06 ENCOUNTER — Emergency Department (HOSPITAL_BASED_OUTPATIENT_CLINIC_OR_DEPARTMENT_OTHER): Payer: BLUE CROSS/BLUE SHIELD

## 2017-01-06 ENCOUNTER — Emergency Department (HOSPITAL_BASED_OUTPATIENT_CLINIC_OR_DEPARTMENT_OTHER)
Admission: EM | Admit: 2017-01-06 | Discharge: 2017-01-06 | Disposition: A | Discharge status: Home / Self Care | Payer: BLUE CROSS/BLUE SHIELD | Attending: Physician Assistant | Admitting: Physician Assistant

## 2017-01-06 ENCOUNTER — Other Ambulatory Visit: Payer: Self-pay

## 2017-01-06 DIAGNOSIS — Z79899 Other long term (current) drug therapy: Secondary | ICD-10-CM | POA: Diagnosis not present

## 2017-01-06 DIAGNOSIS — J069 Acute upper respiratory infection, unspecified: Secondary | ICD-10-CM | POA: Diagnosis not present

## 2017-01-06 DIAGNOSIS — F419 Anxiety disorder, unspecified: Secondary | ICD-10-CM | POA: Diagnosis not present

## 2017-01-06 DIAGNOSIS — B9789 Other viral agents as the cause of diseases classified elsewhere: Secondary | ICD-10-CM

## 2017-01-06 DIAGNOSIS — R05 Cough: Secondary | ICD-10-CM | POA: Diagnosis not present

## 2017-01-06 DIAGNOSIS — I10 Essential (primary) hypertension: Secondary | ICD-10-CM | POA: Insufficient documentation

## 2017-01-06 DIAGNOSIS — F329 Major depressive disorder, single episode, unspecified: Secondary | ICD-10-CM | POA: Diagnosis not present

## 2017-01-06 DIAGNOSIS — F17210 Nicotine dependence, cigarettes, uncomplicated: Secondary | ICD-10-CM | POA: Diagnosis not present

## 2017-01-06 MED ORDER — FLUTICASONE PROPIONATE 50 MCG/ACT NA SUSP: 2.0000 | Freq: Every day | NASAL | 0 refills | Status: DC

## 2017-01-06 MED ORDER — BENZONATATE 100 MG PO CAPS: 100.0000 mg | ORAL_CAPSULE | Freq: Three times a day (TID) | ORAL | 0 refills | Status: DC

## 2017-01-06 MED ORDER — NAPROXEN 250 MG PO TABS
500.0000 mg | ORAL_TABLET | Freq: Once | ORAL | Status: AC
  Administered 2017-01-06: 500 mg via ORAL
  Filled 2017-01-06: qty 2

## 2017-01-06 NOTE — ED Triage Notes (Addendum)
Pt c/o cough and sneezing since Fri; feels generally bad, but no specific pain; reports chest tightness

## 2017-01-06 NOTE — Discharge Instructions (Signed)
Your symptoms are likely caused by a viral upper respiratory infection, your workup is reassuring.  I will continue to treat symptoms supportively, drink lots of fluids, you may use Flonase and warm nasal flushes to help reduce nasal congestion, Tessalon Perles for cough, try and keep throat bathed with warm teas with honey, throat lozenges or water to help prevent cough.  Ibuprofen or Tylenol as needed for pain and fever.  If you are not improving through the rest of the week please follow-up with your primary doctor.  If you develop persistent fevers chest pain shortness of breath or difficulty breathing please return to the ED for sooner evaluation.

## 2017-01-06 NOTE — ED Provider Notes (Signed)
[facility] EMERGENCY DEPARTMENT Provider Note   CSN: [id number] Arrival date & time: 01/06/17  1017     History   Chief Complaint Chief Complaint  Patient presents with  . Cough    HPI  James Downs is a 51 y.o. Male with a history of hypertension, hyperlipidemia, sleep apnea and depression, presents complaining of cough, nasal congestion and sneezing since Friday.  Patient reports subjective fevers and chills and general malaise.  He reports associated chest tightness and some slight shortness of breath denies any chest pain. reports associated scratchy throat, no ear pain, no nausea, vomiting or abdominal pain.  Patient has been using NyQuil primarily for symptoms with minimal improvement, and Aleve for aches and pains.  He reports he has been able to eat and drink well, but reports decreased appetite with symptoms.      Past Medical History:  Diagnosis Date  . Arthritis   . Depression   . Gout   . Hypercholesterolemia   . Hypertension    on meds  . Kidney stone   . Sleep apnea    uses CPAP    Patient Active Problem List   Diagnosis Date Noted  . 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 Weakness of left leg 11/04/2013  . Paresthesias 11/04/2013  . Lumbar radiculopathy 11/04/2013  . Degenerative disc disease, lumbar 11/04/2013  . ONYCHOMYCOSIS 12/23/2006  . HYPERLIPIDEMIA 12/23/2006  . ANXIETY DISORDER, GENERALIZED 12/23/2006  . TOBACCO ABUSE 12/23/2006  . SYNDROME, CARPAL TUNNEL 12/23/2006  . HYPERTENSION 12/23/2006  . NEPHROLITHIASIS, HX OF 12/23/2006    Past Surgical History:  Procedure Laterality Date  . FOOT SURGERY Right 1991  . HAND SURGERY Right 1985       Home Medications    Prior to Admission medications   Medication Sig Start Date End Date Taking? Authorizing Provider  amoxicillin (AMOXIL) 500 MG capsule Take 2 capsules (1,000 mg total) by mouth daily. 08/19/16   Trixie DredgeWest, Emily, PA-C    benzonatate (TESSALON) 100 MG capsule Take 1 capsule (100 mg total) by mouth every 8 (eight) hours. 04/29/16   Rise MuLeaphart, Kenneth T, PA-C  Fexofenadine HCl (ALLEGRA ALLERGY PO) Take by mouth.    [provider]  fluticasone (FLONASE) 50 MCG/ACT nasal spray Place 2 sprays into both nostrils daily. 04/29/16   Rise MuLeaphart, Kenneth T, PA-C  HYDRALAZINE-HCTZ PO Take by mouth.    [provider]  PARoxetine HCl (PAXIL PO) Take by mouth.    [provider]    Family History Family History  Problem Relation Age of Onset  . Cancer Father        BRAIN/PROSTATE/SKIN  . Hypertension Father   . Cancer Paternal Grandfather   . Heart failure Maternal Grandmother   . Diabetes Maternal Grandmother   . Hypertension Maternal Grandmother   . Hypertension Maternal Grandfather     Social History Social History   Tobacco Use  . Smoking status: Current Every Day Smoker    Packs/day: 1.00    Years: 30.00    Pack years: 30.00    Types: Cigarettes  . Smokeless tobacco: Former Engineer, waterUser  Substance Use Topics  . Alcohol use: No  . Drug use: No     Allergies   Patient has no known allergies.   Review of Systems Review of Systems  Constitutional: Negative for chills and fever.  HENT: Positive for congestion, postnasal drip, rhinorrhea, sinus pressure, sneezing and sore throat. Negative for ear discharge, ear pain, trouble  swallowing and voice change.   Eyes: Negative for pain and discharge.  Respiratory: Positive for cough, chest tightness and shortness of breath. Negative for wheezing.   Cardiovascular: Negative for chest pain.  Gastrointestinal: Negative for abdominal pain, nausea and vomiting.  Musculoskeletal: Positive for myalgias.  Skin: Negative for rash.  Neurological: Negative for headaches.     Physical Exam Updated Vital Signs BP (!) 157/109   Pulse 97   Temp 97.8 F (36.6 C) (Oral)   Resp 20   Ht 5\' 8"  (1.727 m)   Wt 104.3 kg (230 lb)   SpO2 98%   BMI  34.97 kg/m   Physical Exam  Constitutional: He appears well-developed and well-nourished. No distress.  HENT:  Head: Normocephalic and atraumatic.  TMs clear with good landmarks, moderate nasal mucosa edema with clear rhinorrhea, posterior oropharynx clear and moist, with some erythema, no edema or exudates. Uvula midline, no trismus  Eyes: Right eye exhibits no discharge. Left eye exhibits no discharge.  Neck: Neck supple.  Cardiovascular: Normal rate, regular rhythm and normal heart sounds.  Pulmonary/Chest: Effort normal and breath sounds normal. No stridor. No respiratory distress. He has no wheezes. He has no rales.  Abdominal: Soft. Bowel sounds are normal. He exhibits no distension. There is no tenderness. There is no guarding.  Neurological: He is alert. Coordination normal.  Skin: Skin is warm and dry. Capillary refill takes less than 2 seconds. He is not diaphoretic.  Psychiatric: He has a normal mood and affect. His behavior is normal.  Nursing note and vitals reviewed.    ED Treatments / Results  Labs (all labs ordered are listed, but only abnormal results are displayed) Labs Reviewed - No data to display  EKG  EKG Interpretation  Date/Time:  Tuesday January 06 2017 10:42:31 EST Ventricular Rate:  88 PR Interval:    QRS Duration: 89 QT Interval:  350 QTC Calculation: 424 R Axis:   43 Text Interpretation:  Sinus rhythm Abnormal R-wave progression, early transition Normal sinus rhythm Confirmed by Corlis LeakMackuen, Courteney ([id number]) on 01/06/2017 12:24:14 PM       Radiology Dg Chest 2 View  Result Date: 01/06/2017 CLINICAL DATA:  Pt c/o cough and sneezing since Fri; feels generally bad, but no specific pain; reports chest tightness, HTN, smoker EXAM: CHEST  2 VIEW COMPARISON:  04/29/2016 FINDINGS: Midline trachea. Normal heart size and mediastinal contours. No pleural effusion or pneumothorax. Clear lungs. IMPRESSION: No acute cardiopulmonary disease. Electronically  Signed   By: Jeronimo GreavesKyle  Talbot M.D.   On: 01/06/2017 11:43    Procedures Procedures (including critical care time)  Medications Ordered in ED Medications  naproxen (NAPROSYN) tablet 500 mg (500 mg Oral Given 01/06/17 1148)     Initial Impression / Assessment and Plan / ED Course  I have reviewed the triage vital signs and the nursing notes.  Pertinent labs & imaging results that were available during my care of the patient were reviewed by me and considered in my medical decision making (see chart for details).  Pt presents with nasal congestion and cough. Pt is well appearing and vitals are normal. Lungs CTA on exam. Pt CXR negative for acute infiltrate and EKG unremarkable. Patients symptoms are consistent with URI, likely viral etiology. Discussed that antibiotics are not indicated for viral infections. Pt will be discharged with symptomatic treatment.  Verbalizes understanding and is agreeable with plan. Pt is hemodynamically stable & in NAD prior to dc.   Final Clinical Impressions(s) / ED Diagnoses  Final diagnoses:  Viral URI with cough    ED Discharge Orders        Ordered    fluticasone (FLONASE) 50 MCG/ACT nasal spray  Daily     01/06/17 1328    benzonatate (TESSALON) 100 MG capsule  Every 8 hours     01/06/17 1328       Dartha Lodge, [place] 01/06/17 2010    Abelino Derrick, MD 01/08/17 1041

## 2017-01-06 NOTE — ED Notes (Signed)
Pt on monitor 

## 2017-04-26 ENCOUNTER — Emergency Department (HOSPITAL_BASED_OUTPATIENT_CLINIC_OR_DEPARTMENT_OTHER)
Admission: EM | Admit: 2017-04-26 | Discharge: 2017-04-26 | Disposition: A | Discharge status: Home / Self Care | Payer: BLUE CROSS/BLUE SHIELD | Attending: Emergency Medicine | Admitting: Emergency Medicine

## 2017-04-26 ENCOUNTER — Emergency Department (HOSPITAL_BASED_OUTPATIENT_CLINIC_OR_DEPARTMENT_OTHER): Payer: BLUE CROSS/BLUE SHIELD

## 2017-04-26 ENCOUNTER — Encounter (HOSPITAL_BASED_OUTPATIENT_CLINIC_OR_DEPARTMENT_OTHER): Payer: Self-pay | Admitting: Emergency Medicine

## 2017-04-26 ENCOUNTER — Other Ambulatory Visit: Payer: Self-pay

## 2017-04-26 DIAGNOSIS — Z79899 Other long term (current) drug therapy: Secondary | ICD-10-CM | POA: Insufficient documentation

## 2017-04-26 DIAGNOSIS — I10 Essential (primary) hypertension: Secondary | ICD-10-CM | POA: Diagnosis not present

## 2017-04-26 DIAGNOSIS — Z87442 Personal history of urinary calculi: Secondary | ICD-10-CM | POA: Insufficient documentation

## 2017-04-26 DIAGNOSIS — M545 Low back pain, unspecified: Secondary | ICD-10-CM

## 2017-04-26 DIAGNOSIS — F17210 Nicotine dependence, cigarettes, uncomplicated: Secondary | ICD-10-CM | POA: Insufficient documentation

## 2017-04-26 DIAGNOSIS — M546 Pain in thoracic spine: Secondary | ICD-10-CM | POA: Diagnosis not present

## 2017-04-26 LAB — URINALYSIS, ROUTINE W REFLEX MICROSCOPIC
Bilirubin Urine: NEGATIVE
Glucose, UA: NEGATIVE mg/dL
Hgb urine dipstick: NEGATIVE
Ketones, ur: NEGATIVE mg/dL
Leukocytes, UA: NEGATIVE
Nitrite: NEGATIVE
Protein, ur: NEGATIVE mg/dL
Specific Gravity, Urine: >1.03 — ABNORMAL HIGH (ref 1.005–1.030)
pH: 5.5 (ref 5.0–8.0)

## 2017-04-26 MED ORDER — HYDROCHLOROTHIAZIDE 50 MG PO TABS
50.0000 mg | ORAL_TABLET | Freq: Every day | ORAL | 0 refills | Status: DC
Start: 2017-04-26 — End: 2018-06-15

## 2017-04-26 MED ORDER — HYDROCHLOROTHIAZIDE 25 MG PO TABS
50.0000 mg | ORAL_TABLET | Freq: Once | ORAL | Status: AC
  Administered 2017-04-26: 50 mg via ORAL
  Filled 2017-04-26: qty 2

## 2017-04-26 MED ORDER — OXYCODONE-ACETAMINOPHEN 5-325 MG PO TABS
2.0000 | ORAL_TABLET | Freq: Once | ORAL | Status: AC
  Administered 2017-04-26: 2 via ORAL
  Filled 2017-04-26: qty 2

## 2017-04-26 MED ORDER — TELMISARTAN 20 MG PO TABS: 20.0000 mg | ORAL_TABLET | Freq: Every day | ORAL | 0 refills | Status: DC

## 2017-04-26 MED ORDER — METHOCARBAMOL 500 MG PO TABS
500.0000 mg | ORAL_TABLET | Freq: Once | ORAL | Status: AC
  Administered 2017-04-26: 500 mg via ORAL
  Filled 2017-04-26: qty 1

## 2017-04-26 MED ORDER — METHOCARBAMOL 500 MG PO TABS: 500.0000 mg | ORAL_TABLET | Freq: Two times a day (BID) | ORAL | 0 refills | Status: DC

## 2017-04-26 NOTE — ED Triage Notes (Signed)
R low back pain since Friday. Denies injury.

## 2017-04-26 NOTE — ED Provider Notes (Signed)
[facility] EMERGENCY DEPARTMENT Provider Note   CSN: [id number] Arrival date & time: 04/26/17  1031     History   Chief Complaint Chief Complaint  Patient presents with  . Back Pain    HPI  James Downs is a 52 y.o. Male with a history of hypertension, gout, kidney stones and depression, who presents to the ED for evaluation of lower back pain starting 2 days ago on Friday morning when he woke up.  Patient denies any injury or recent heavy lifting, reports he often has back pain similar to this with weather changes and thought this might be the rain.  Patient reports a prior history of thoracic back surgery and discectomy, but back pain today is lower.  She reports constant ache and spasm sensation across the lower back, worse on the right side.  Pain is worse with movement, he feels like he cannot stand up straight all the way.  He reports some chronic numbness and tingling in his left lower quadrant and left upper thigh since his previous surgery no new numbness or tingling in the legs, no weakness, patient has been ambulatory with pain.  No loss of bowel or bladder control.  Patient denies associated fevers or chills, abdominal pain, dysuria or urinary frequency, this does not feel similar to his prior kidney stones.  No history of cancer or IV drug use.  Patient noted to be hypertensive in triage, reports he is been out of his blood pressure medications for a few weeks typically takes telmisartan and HCTZ daily.  Has upcoming appointment to get this checked and refilled.      Past Medical History:  Diagnosis Date  . Arthritis   . Depression   . Gout   . Hypercholesterolemia   . Hypertension    on meds  . Kidney stone   . Sleep apnea    uses CPAP    Patient Active Problem List   Diagnosis Date Noted  . 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 Weakness of left leg 11/04/2013  . Paresthesias 11/04/2013  . Lumbar  radiculopathy 11/04/2013  . Degenerative disc disease, lumbar 11/04/2013  . ONYCHOMYCOSIS 12/23/2006  . HYPERLIPIDEMIA 12/23/2006  . ANXIETY DISORDER, GENERALIZED 12/23/2006  . TOBACCO ABUSE 12/23/2006  . SYNDROME, CARPAL TUNNEL 12/23/2006  . HYPERTENSION 12/23/2006  . NEPHROLITHIASIS, HX OF 12/23/2006    Past Surgical History:  Procedure Laterality Date  . FOOT SURGERY Right 1991  . HAND SURGERY Right 1985  . POSTERIOR LUMBAR FUSION 4 LEVEL N/A 11/29/2013   Procedure: Thoracic Eight, Thoracic Nine, Costotransversectomy   Thoracic Seven-Eight Thoracic Eight-Nine diskectomy;  Surgeon: Lisbeth Renshaw, MD;  Location: [facility];  Service: Neurosurgery;  Laterality: N/A;  Thoracic Eight, Thoracic Nine, Costotransversectomy   Thoracic Seven-Eight Thoracic Eight-Nine diskectomy       Home Medications    Prior to Admission medications   Medication Sig Start Date End Date Taking? Authorizing Provider  hydrochlorothiazide (HYDRODIURIL) 50 MG tablet Take 50 mg by mouth daily.   Yes [provider]  telmisartan (MICARDIS) 20 MG tablet Take 20 mg by mouth daily.   Yes [provider]  amoxicillin (AMOXIL) 500 MG capsule Take 2 capsules (1,000 mg total) by mouth daily. 08/19/16   Trixie Dredge, PA-C  benzonatate (TESSALON) 100 MG capsule Take 1 capsule (100 mg total) by mouth every 8 (eight) hours. 04/29/16   Rise Mu, PA-C  benzonatate (TESSALON) 100 MG capsule Take 1 capsule (100  mg total) every 8 (eight) hours by mouth. 01/06/17   Dartha LodgeFord, Kelsey N, PA-C  Fexofenadine HCl [facility](ALLEGRA ALLERGY PO) Take by mouth.    [provider]  fluticasone (FLONASE) 50 MCG/ACT nasal spray Place 2 sprays into both nostrils daily. 04/29/16   Rise MuLeaphart, Kenneth T, PA-C  fluticasone (FLONASE) 50 MCG/ACT nasal spray Place 2 sprays daily into both nostrils. 01/06/17   Dartha LodgeFord, Kelsey N, PA-C  HYDRALAZINE-HCTZ PO Take by mouth.    [provider]  PARoxetine HCl (PAXIL PO) Take  by mouth.    [provider]    Family History Family History  Problem Relation Age of Onset  . Cancer Father        BRAIN/PROSTATE/SKIN  . Hypertension Father   . Cancer Paternal Grandfather   . Heart failure Maternal Grandmother   . Diabetes Maternal Grandmother   . Hypertension Maternal Grandmother   . Hypertension Maternal Grandfather     Social History Social History   Tobacco Use  . Smoking status: Current Every Day Smoker    Packs/day: 1.00    Years: 30.00    Pack years: 30.00    Types: Cigarettes  . Smokeless tobacco: Former Engineer, waterUser  Substance Use Topics  . Alcohol use: No  . Drug use: No     Allergies   Patient has no known allergies.   Review of Systems Review of Systems  Constitutional: Negative for chills and fever.  Eyes: Negative for visual disturbance.  Respiratory: Negative for shortness of breath.   Cardiovascular: Negative for chest pain.  Gastrointestinal: Negative for abdominal pain, blood in stool, constipation, diarrhea, nausea and vomiting.  Genitourinary: Negative for difficulty urinating, dysuria, frequency and hematuria.  Musculoskeletal: Positive for back pain. Negative for arthralgias, gait problem, joint swelling, myalgias, neck pain and neck stiffness.  Skin: Negative for color change, rash and wound.  Neurological: Negative for dizziness, syncope, weakness, light-headedness and numbness.     Physical Exam Updated Vital Signs BP (!) 184/106 (BP Location: Left Arm) Comment: pt out of BP meds for a few weeks  Pulse 87   Temp 99 F (37.2 C) (Oral)   Resp 18   Ht 5\' 9"  (1.753 m)   Wt 113.4 kg (250 lb)   SpO2 98%   BMI 36.92 kg/m   Physical Exam  Constitutional: He appears well-developed and well-nourished. No distress.  HENT:  Head: Normocephalic and atraumatic.  Eyes: Right eye exhibits no discharge. Left eye exhibits no discharge.  Neck: Normal range of motion. Neck supple.  No midline C-spine tenderness, normal  range of motion of the neck  Cardiovascular: Normal rate, regular rhythm, normal heart sounds and intact distal pulses.  Pulses:      Radial pulses are 2+ on the right side, and 2+ on the left side.       Dorsalis pedis pulses are 2+ on the right side, and 2+ on the left side.  Pulmonary/Chest: Effort normal and breath sounds normal. No stridor. No respiratory distress. He has no wheezes. He has no rales.  Respirations equal and unlabored, patient able to speak in full sentences, lungs clear to auscultation bilaterally  Abdominal: Soft. Bowel sounds are normal. He exhibits no distension and no mass. There is no tenderness. There is no guarding.  Abdomen soft, nontender to palpation in all quadrants, no pulsatile abdominal masses, rebound tenderness or peritoneal signs  Musculoskeletal: He exhibits no edema or deformity.  Tenderness to palpation across lower back slightly worse on the right side,  no erythema or palpable deformity, muscle seem tense on palpation, no focal midline spinal tenderness of the lumbar or thoracic spine, pain not worse with inspiration, pain made worse with range of motion of lower extremities.  Bilateral lower extremities with 2+ distal pulses  Neurological: He is alert. Coordination normal.  Speech is clear, able to follow commands CN III-XII intact Normal strength in upper and lower extremities bilaterally including dorsiflexion and plantar flexion, 5/5 strength with proximal and distal extremity muscles. Patellar and Achilles DTRs 2+ and equal bilaterally Sensation normal to light and sharp touch Moves extremities without ataxia, coordination intact   Skin: Skin is warm and dry. Capillary refill takes less than 2 seconds. He is not diaphoretic.  Psychiatric: He has a normal mood and affect. His behavior is normal.  Nursing note and vitals reviewed.    ED Treatments / Results  Labs (all labs ordered are listed, but only abnormal results are displayed) Labs  Reviewed - No data to display  EKG  EKG Interpretation None       Radiology No results found.  Procedures Procedures (including critical care time)  Medications Ordered in ED Medications  oxyCODONE-acetaminophen (PERCOCET/ROXICET) 5-325 MG per tablet 2 tablet (2 tablets Oral Given 04/26/17 1135)  methocarbamol (ROBAXIN) tablet 500 mg (500 mg Oral Given 04/26/17 1135)  hydrochlorothiazide (HYDRODIURIL) tablet 50 mg (50 mg Oral Given 04/26/17 1206)     Initial Impression / Assessment and Plan / ED Course  I have reviewed the triage vital signs and the nursing notes.  Pertinent labs & imaging results that were available during my care of the patient were reviewed by me and considered in my medical decision making (see chart for details).  Patient with back pain.  No neurological deficits and normal neuro exam.  Patient can walk but states is painful.  No loss of bowel or bladder control.  No concern for cauda equina.  No fever, night sweats, weight loss, h/o cancer, IVDU.  X-rays of thoracic and lumbar spine show stable surgical changes, mild degenerative disc disease no acute changes.  Pain improved in the ED with muscle relaxers and pain medication.  Strict return precautions discussed with the patient he is to follow-up with his PCP, will call tomorrow for appointment.  Patient provided Robaxin prescription, encouraged to continue using Tylenol and NSAIDs, as well as ice and heat.  Pt's blood pressure was elevated today, pt has hx of HTN, has been out of his blood pressure medications for the past few weeks, typically takes HCTZ and telmisartan, pt is not exhibiting any symptoms to suggest hypertensive urgency or emergency today, patient given HCTZ today with improvement in his blood pressure, will provide small amount of both of his blood pressure medications to bridge follow-up with PCP, and to follow-up in the next few days regarding hypertension and back pain. Discussed long term  consequences of untreated hypertension with the patient.   Final Clinical Impressions(s) / ED Diagnoses   Final diagnoses:  Acute bilateral low back pain without sciatica  Hypertension, unspecified type    ED Discharge Orders        Ordered    telmisartan (MICARDIS) 20 MG tablet  Daily     04/26/17 1333    hydrochlorothiazide (HYDRODIURIL) 50 MG tablet  Daily     04/26/17 1333    methocarbamol (ROBAXIN) 500 MG tablet  2 times daily     04/26/17 1333       Dartha Lodge, [place] 04/26/17 1344  Rolan Bucco, MD 04/26/17 [phone]

## 2017-04-26 NOTE — ED Notes (Signed)
ED Provider at bedside. 

## 2017-04-26 NOTE — ED Notes (Signed)
Pt given Rx x 3. Pt d/c home with family. States "I feel much better"

## 2017-04-26 NOTE — Discharge Instructions (Addendum)
Your x-ray show evidence of degenerative disc disease but no acute fractures or changes, your previous surgery appears to be stable.  Please follow-up with your primary care doctor in the next few days for continued evaluation of your back pain you can continue using Tylenol, ibuprofen as well as Robaxin to help with pain I encourage you to use ice and heat as well.  If you develop numbness, tingling or weakness in your lower legs, difficulty walking, severely worsened back pain, or difficulty controlling her bowels or bladder please return to the ED for reevaluation.  Your blood pressure was elevated today, please follow up with your primary doctor for reevaluation, I have prescribed a small amount of your blood pressure medication to bridge until you are able to follow-up with him please begin taking this daily.  If you develop chest pain, shortness of breath, headache, vision changes, weakness numbness or tingling in any of your extremities please return to the ED for reevaluation.

## 2017-04-28 DIAGNOSIS — I10 Essential (primary) hypertension: Secondary | ICD-10-CM | POA: Diagnosis not present

## 2017-04-28 DIAGNOSIS — M5441 Lumbago with sciatica, right side: Secondary | ICD-10-CM | POA: Diagnosis not present

## 2017-04-28 DIAGNOSIS — F339 Major depressive disorder, recurrent, unspecified: Secondary | ICD-10-CM | POA: Diagnosis not present

## 2017-05-11 DIAGNOSIS — F339 Major depressive disorder, recurrent, unspecified: Secondary | ICD-10-CM | POA: Diagnosis not present

## 2017-05-11 DIAGNOSIS — F17200 Nicotine dependence, unspecified, uncomplicated: Secondary | ICD-10-CM | POA: Diagnosis not present

## 2017-05-11 DIAGNOSIS — I10 Essential (primary) hypertension: Secondary | ICD-10-CM | POA: Diagnosis not present

## 2017-05-11 DIAGNOSIS — M5441 Lumbago with sciatica, right side: Secondary | ICD-10-CM | POA: Diagnosis not present

## 2017-07-08 DIAGNOSIS — G4733 Obstructive sleep apnea (adult) (pediatric): Secondary | ICD-10-CM | POA: Diagnosis not present

## 2017-08-10 ENCOUNTER — Encounter (HOSPITAL_BASED_OUTPATIENT_CLINIC_OR_DEPARTMENT_OTHER): Payer: Self-pay | Admitting: *Deleted

## 2017-08-10 ENCOUNTER — Other Ambulatory Visit: Payer: Self-pay

## 2017-08-10 ENCOUNTER — Emergency Department (HOSPITAL_BASED_OUTPATIENT_CLINIC_OR_DEPARTMENT_OTHER): Payer: BLUE CROSS/BLUE SHIELD

## 2017-08-10 ENCOUNTER — Emergency Department (HOSPITAL_BASED_OUTPATIENT_CLINIC_OR_DEPARTMENT_OTHER)
Admission: EM | Admit: 2017-08-10 | Discharge: 2017-08-10 | Disposition: A | Discharge status: Home / Self Care | Payer: BLUE CROSS/BLUE SHIELD | Attending: Physician Assistant | Admitting: Physician Assistant

## 2017-08-10 DIAGNOSIS — R05 Cough: Secondary | ICD-10-CM | POA: Diagnosis not present

## 2017-08-10 DIAGNOSIS — I10 Essential (primary) hypertension: Secondary | ICD-10-CM | POA: Diagnosis not present

## 2017-08-10 DIAGNOSIS — J029 Acute pharyngitis, unspecified: Secondary | ICD-10-CM | POA: Diagnosis not present

## 2017-08-10 DIAGNOSIS — R070 Pain in throat: Secondary | ICD-10-CM | POA: Insufficient documentation

## 2017-08-10 DIAGNOSIS — Z79899 Other long term (current) drug therapy: Secondary | ICD-10-CM | POA: Insufficient documentation

## 2017-08-10 DIAGNOSIS — J069 Acute upper respiratory infection, unspecified: Secondary | ICD-10-CM | POA: Diagnosis not present

## 2017-08-10 DIAGNOSIS — B9789 Other viral agents as the cause of diseases classified elsewhere: Secondary | ICD-10-CM

## 2017-08-10 DIAGNOSIS — F17210 Nicotine dependence, cigarettes, uncomplicated: Secondary | ICD-10-CM | POA: Insufficient documentation

## 2017-08-10 DIAGNOSIS — R0981 Nasal congestion: Secondary | ICD-10-CM | POA: Diagnosis not present

## 2017-08-10 LAB — RAPID STREP SCREEN (MED CTR MEBANE ONLY): Streptococcus, Group A Screen (Direct): NEGATIVE

## 2017-08-10 MED ORDER — BENZONATATE 100 MG PO CAPS: 100.0000 mg | ORAL_CAPSULE | Freq: Three times a day (TID) | ORAL | 0 refills | Status: DC

## 2017-08-10 MED ORDER — FLUTICASONE PROPIONATE 50 MCG/ACT NA SUSP: 1.0000 | Freq: Every day | NASAL | 2 refills | Status: DC

## 2017-08-10 MED FILL — FLUTICASONE PROP 50 MCG SPR: 50 | 60 days supply | Qty: 16 | Fill #0

## 2017-08-10 MED FILL — BENZONATATE 100 MG CAPSULE: 100 | 7 days supply | Qty: 21 | Fill #0

## 2017-08-10 NOTE — ED Provider Notes (Signed)
[facility] EMERGENCY DEPARTMENT Provider Note   CSN: [id number] Arrival date & time: 08/10/17  1451     History   Chief Complaint Chief Complaint  Patient presents with  . URI    HPI James Downs is a 52 y.o. male who presents for evaluation of 2 days of generalized malaise, nasal congestion, rhinorrhea, cough, decreased appetite.  Also reports he is having sore throat.  Reports he still able to tolerate secretions and his p.o.  No difficulty breathing, vomiting.  Patient reports that he is also felt like he has had sinus pressure.  He states he took over-the-counter cold medication for symptoms with no improvement in symptoms.  He reports that wife is at home with the same symptoms.  Patient reports that he has not measured a fever at home.  He states he has some chest soreness with coughing but no other chest pain or difficulty breathing.  Denies any history of COPD or asthma.  He does report he is a current smoker.  Patient denies any chest pain, difficulty breathing, abdominal pain, vomiting, urinary complaints.  The history is provided by the patient.    Past Medical History:  Diagnosis Date  . Arthritis   . Depression   . Gout   . Hypercholesterolemia   . Hypertension    on meds  . Kidney stone   . Sleep apnea    uses CPAP    Patient Active Problem List   Diagnosis Date Noted  . 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 Weakness of left leg 11/04/2013  . Paresthesias 11/04/2013  . Lumbar radiculopathy 11/04/2013  . Degenerative disc disease, lumbar 11/04/2013  . ONYCHOMYCOSIS 12/23/2006  . HYPERLIPIDEMIA 12/23/2006  . ANXIETY DISORDER, GENERALIZED 12/23/2006  . TOBACCO ABUSE 12/23/2006  . SYNDROME, CARPAL TUNNEL 12/23/2006  . HYPERTENSION 12/23/2006  . NEPHROLITHIASIS, HX OF 12/23/2006    Past Surgical History:  Procedure Laterality Date  . FOOT SURGERY Right 1991  . HAND SURGERY Right 1985  . POSTERIOR  LUMBAR FUSION 4 LEVEL N/A 11/29/2013   Procedure: Thoracic Eight, Thoracic Nine, Costotransversectomy   Thoracic Seven-Eight Thoracic Eight-Nine diskectomy;  Surgeon: Lisbeth Renshaw, MD;  Location: [facility];  Service: Neurosurgery;  Laterality: N/A;  Thoracic Eight, Thoracic Nine, Costotransversectomy   Thoracic Seven-Eight Thoracic Eight-Nine diskectomy        Home Medications    Prior to Admission medications   Medication Sig Start Date End Date Taking? Authorizing Provider  benzonatate (TESSALON) 100 MG capsule Take 1 capsule (100 mg total) by mouth every 8 (eight) hours. 08/10/17   Maxwell Caul, PA-C  Fexofenadine HCl (ALLEGRA ALLERGY PO) Take by mouth.    [provider]  fluticasone (FLONASE) 50 MCG/ACT nasal spray Place 1 spray into both nostrils daily. 08/10/17   Maxwell Caul, PA-C  HYDRALAZINE-HCTZ PO Take by mouth.    [provider]  hydrochlorothiazide (HYDRODIURIL) 50 MG tablet Take 1 tablet (50 mg total) by mouth daily. 04/26/17   Dartha Lodge, PA-C  methocarbamol (ROBAXIN) 500 MG tablet Take 1 tablet (500 mg total) by mouth 2 (two) times daily. 04/26/17   Dartha Lodge, PA-C  PARoxetine HCl (PAXIL PO) Take by mouth.    [provider]  telmisartan (MICARDIS) 20 MG tablet Take 1 tablet (20 mg total) by mouth daily. 04/26/17   Dartha Lodge, PA-C    Family History Family History  Problem Relation Age of Onset  . Cancer Father  BRAIN/PROSTATE/SKIN  . Hypertension Father   . Cancer Paternal Grandfather   . Heart failure Maternal Grandmother   . Diabetes Maternal Grandmother   . Hypertension Maternal Grandmother   . Hypertension Maternal Grandfather     Social History Social History   Tobacco Use  . Smoking status: Current Every Day Smoker    Packs/day: 1.00    Years: 30.00    Pack years: 30.00    Types: Cigarettes  . Smokeless tobacco: Former Engineer, water Use Topics  . Alcohol use: No  . Drug use: No      Allergies   Patient has no known allergies.   Review of Systems Review of Systems  Constitutional: Positive for appetite change. Negative for fever.  HENT: Positive for congestion, rhinorrhea, sinus pressure and sore throat.   Respiratory: Positive for cough. Negative for shortness of breath.   Cardiovascular: Negative for chest pain.  Gastrointestinal: Negative for abdominal pain, nausea and vomiting.  Genitourinary: Negative for dysuria and hematuria.  Neurological: Positive for headaches.  All other systems reviewed and are negative.    Physical Exam Updated Vital Signs BP (!) 141/104 (BP Location: Right Arm)   Pulse 86   Temp 98.3 F (36.8 C) (Oral)   Resp 16   Ht 5' 8.5" (1.74 m)   Wt 113.4 kg (250 lb)   SpO2 97%   BMI 37.46 kg/m   Physical Exam  Constitutional: He is oriented to person, place, and time. He appears well-developed and well-nourished.  HENT:  Head: Normocephalic and atraumatic.  Nose: Mucosal edema present.  Mouth/Throat: Uvula is midline and mucous membranes are normal. No trismus in the jaw. Posterior oropharyngeal edema and posterior oropharyngeal erythema present. No oropharyngeal exudate.  Nasal mucosal edema present.  Uvula is midline.  No trismus.  Airways patent, phonation is intact.  Posterior oropharynx with slight erythema, edema   Eyes: Pupils are equal, round, and reactive to light. Conjunctivae, EOM and lids are normal.  Neck: Full passive range of motion without pain.  Cardiovascular: Normal rate, regular rhythm, normal heart sounds and normal pulses. Exam reveals no gallop and no friction rub.  No murmur heard. Pulmonary/Chest: Effort normal and breath sounds normal.  Lungs clear to auscultation bilaterally.  Symmetric chest rise.  No wheezing, rales, rhonchi.  Abdominal: Soft. Normal appearance. There is no tenderness. There is no rigidity and no guarding.  Abdomen is soft, non-distended, non-tender. No rigidity, No guarding. No  peritoneal signs.  Musculoskeletal: Normal range of motion.  Neurological: He is alert and oriented to person, place, and time.  Skin: Skin is warm and dry. Capillary refill takes less than 2 seconds.  Psychiatric: He has a normal mood and affect. His speech is normal.  Nursing note and vitals reviewed.    ED Treatments / Results  Labs (all labs ordered are listed, but only abnormal results are displayed) Labs Reviewed  RAPID STREP SCREEN ([facility] ONLY)  CULTURE, GROUP A STREP [facility])    EKG None  Radiology Dg Chest 2 View  Result Date: 08/10/2017 CLINICAL DATA:  Headache, chills, sore throat, and body aches for the past 2 days. No cardiopulmonary history. EXAM: CHEST - 2 VIEW COMPARISON:  Chest x-ray of January 06, 2017 FINDINGS: The lungs are adequately inflated. There is no focal infiltrate. There is no pleural effusion. The heart and pulmonary vascularity are normal. The mediastinum is normal in width. The trachea is midline. The bony thorax exhibits no acute abnormality. IMPRESSION: There is no  pneumonia nor other acute cardiopulmonary abnormality. Electronically Signed   By: David  Swazil[place]Jordan M.D.   On: 08/10/2017 16:39    Procedures Procedures (including critical care time)  Medications Ordered in ED Medications - No data to display   Initial Impression / Assessment and Plan / ED Course  I have reviewed the triage vital signs and the nursing notes.  Pertinent labs & imaging results that were available during my care of the patient were reviewed by me and considered in my medical decision making (see chart for details).     52 year old male who presents for evaluation of 2 days of nasal congestion, rhinorrhea, cough.  Also reports sore throat, no malaise, headache.  No fevers.  No vomiting, chest pain.  He is a smoker. Patient is afebrile, non-toxic appearing, sitting comfortably on examination table. Vital signs reviewed and stable.  Lungs clear to auscultation  bilaterally on exam.  Posterior oropharynx is slightly erythematous and edematous.  No exudates noted.  Uvula is midline, no trismus.  Consider viral URI versus pneumonia versus bronchitis.  Also consider pharyngitis.  History/physical exam is not concerning for Ludwig angina or peritonsillar abscess.  Will plan for rapid strep. Plan for XR.  X-ray reviewed.  Negative for any acute infectious etiology.  Rapid strep reviewed.  Negative for any strep.  Discussed results with patient.  Symptoms likely result of viral URI.  Will plan for supportive care at home.  Encourage rest and follow-up with primary care. Patient had ample opportunity for questions and discussion. All patient's questions were answered with full understanding. Strict return precautions discussed. Patient expresses understanding and agreement to plan.   Final Clinical Impressions(s) / ED Diagnoses   Final diagnoses:  Viral URI with cough    ED Discharge Orders        Ordered    fluticasone (FLONASE) 50 MCG/ACT nasal spray  Daily     08/10/17 1727    benzonatate (TESSALON) 100 MG capsule  Every 8 hours     08/10/17 1727       Maxwell CaulLayden, Stormie Ventola A, PA-C 08/10/17 1743    Mackuen, Cindee Saltourteney Lyn, MD 08/10/17 2357

## 2017-08-10 NOTE — ED Triage Notes (Signed)
2 days of headache, chills, sore throat and body aches.

## 2017-08-10 NOTE — Discharge Instructions (Signed)
You can take Tylenol or Ibuprofen as directed for pain. You can alternate Tylenol and Ibuprofen every 4 hours. If you take Tylenol at 1pm, then you can take Ibuprofen at 5pm. Then you can take Tylenol again at 9pm.   Take Flonase as directed for congestion.  Take Tessalon Perles for cough.  Follow with your primary care doctor the next 2 to 4 days for further evaluation.  Return the emergency department for any fever, worsening pain, difficulty breathing, vomiting, any other worsening or concerning symptoms.

## 2017-08-10 NOTE — ED Notes (Signed)
Pt sleeping. 

## 2017-08-12 DIAGNOSIS — R110 Nausea: Secondary | ICD-10-CM | POA: Diagnosis not present

## 2017-08-12 DIAGNOSIS — J019 Acute sinusitis, unspecified: Secondary | ICD-10-CM | POA: Diagnosis not present

## 2017-08-13 LAB — CULTURE, GROUP A STREP (THRC)

## 2017-09-04 ENCOUNTER — Other Ambulatory Visit: Payer: Self-pay

## 2017-09-04 ENCOUNTER — Emergency Department (HOSPITAL_BASED_OUTPATIENT_CLINIC_OR_DEPARTMENT_OTHER)
Admission: EM | Admit: 2017-09-04 | Discharge: 2017-09-04 | Disposition: A | Discharge status: Home / Self Care | Payer: BLUE CROSS/BLUE SHIELD | Attending: Emergency Medicine | Admitting: Emergency Medicine

## 2017-09-04 ENCOUNTER — Encounter (HOSPITAL_BASED_OUTPATIENT_CLINIC_OR_DEPARTMENT_OTHER): Payer: Self-pay | Admitting: *Deleted

## 2017-09-04 DIAGNOSIS — I10 Essential (primary) hypertension: Secondary | ICD-10-CM | POA: Insufficient documentation

## 2017-09-04 DIAGNOSIS — Z79899 Other long term (current) drug therapy: Secondary | ICD-10-CM | POA: Insufficient documentation

## 2017-09-04 DIAGNOSIS — F17210 Nicotine dependence, cigarettes, uncomplicated: Secondary | ICD-10-CM | POA: Diagnosis not present

## 2017-09-04 DIAGNOSIS — K611 Rectal abscess: Secondary | ICD-10-CM | POA: Diagnosis not present

## 2017-09-04 DIAGNOSIS — K6289 Other specified diseases of anus and rectum: Secondary | ICD-10-CM | POA: Diagnosis not present

## 2017-09-04 MED ORDER — CLINDAMYCIN HCL 300 MG PO CAPS: 300.0000 mg | ORAL_CAPSULE | Freq: Four times a day (QID) | ORAL | 0 refills | Status: DC

## 2017-09-04 MED ORDER — HYDROCODONE-ACETAMINOPHEN 5-325 MG PO TABS: 1.0000 | ORAL_TABLET | Freq: Four times a day (QID) | ORAL | 0 refills | Status: DC | PRN

## 2017-09-04 MED ORDER — HYDROCODONE-ACETAMINOPHEN 5-325 MG PO TABS
1.0000 | ORAL_TABLET | Freq: Once | ORAL | Status: AC
  Administered 2017-09-04: 1 via ORAL
  Filled 2017-09-04: qty 1

## 2017-09-04 MED ORDER — LIDOCAINE HCL 2 % IJ SOLN
10.0000 mL | Freq: Once | INTRAMUSCULAR | Status: AC
  Administered 2017-09-04: 200 mg
  Filled 2017-09-04: qty 20

## 2017-09-04 NOTE — ED Notes (Signed)
Rectal area is red and moist and very tender per patient.

## 2017-09-04 NOTE — ED Notes (Signed)
I&D tray at bedside.

## 2017-09-04 NOTE — ED Provider Notes (Signed)
[facility] EMERGENCY DEPARTMENT Provider Note   CSN: [id number] Arrival date & time: 09/04/17  1622     History   Chief Complaint Chief Complaint  Patient presents with  . Abscess    HPI James Downs is a 52 y.o. male.  HPI Patient presents with roughly 1 week of perirectal abscess.  States he has had yellowish drainage from the site.  No previous history of abscesses.  No fevers or chills. Past Medical History:  Diagnosis Date  . Arthritis   . Depression   . Gout   . Hypercholesterolemia   . Hypertension    on meds  . Kidney stone   . Sleep apnea    uses CPAP    Patient Active Problem List   Diagnosis Date Noted  . 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 Weakness of left leg 11/04/2013  . Paresthesias 11/04/2013  . Lumbar radiculopathy 11/04/2013  . Degenerative disc disease, lumbar 11/04/2013  . ONYCHOMYCOSIS 12/23/2006  . HYPERLIPIDEMIA 12/23/2006  . ANXIETY DISORDER, GENERALIZED 12/23/2006  . TOBACCO ABUSE 12/23/2006  . SYNDROME, CARPAL TUNNEL 12/23/2006  . HYPERTENSION 12/23/2006  . NEPHROLITHIASIS, HX OF 12/23/2006    Past Surgical History:  Procedure Laterality Date  . FOOT SURGERY Right 1991  . HAND SURGERY Right 1985  . POSTERIOR LUMBAR FUSION 4 LEVEL N/A 11/29/2013   Procedure: Thoracic Eight, Thoracic Nine, Costotransversectomy   Thoracic Seven-Eight Thoracic Eight-Nine diskectomy;  Surgeon: Lisbeth Renshaw, MD;  Location: [facility];  Service: Neurosurgery;  Laterality: N/A;  Thoracic Eight, Thoracic Nine, Costotransversectomy   Thoracic Seven-Eight Thoracic Eight-Nine diskectomy        Home Medications    Prior to Admission medications   Medication Sig Start Date End Date Taking? Authorizing Provider  benzonatate (TESSALON) 100 MG capsule Take 1 capsule (100 mg total) by mouth every 8 (eight) hours. 08/10/17   Maxwell Caul, PA-C  clindamycin (CLEOCIN) 300 MG capsule Take 1  capsule (300 mg total) by mouth 4 (four) times daily. X 7 days 09/04/17   Loren Racer, MD  Fexofenadine HCl [facility] ALLERGY PO) Take by mouth.    [provider]  fluticasone (FLONASE) 50 MCG/ACT nasal spray Place 1 spray into both nostrils daily. 08/10/17   Maxwell Caul, PA-C  HYDRALAZINE-HCTZ PO Take by mouth.    [provider]  hydrochlorothiazide (HYDRODIURIL) 50 MG tablet Take 1 tablet (50 mg total) by mouth daily. 04/26/17   Dartha Lodge, PA-C  HYDROcodone-acetaminophen (NORCO) 5-325 MG tablet Take 1 tablet by mouth every 6 (six) hours as needed for severe pain. 09/04/17   Loren Racer, MD  methocarbamol (ROBAXIN) 500 MG tablet Take 1 tablet (500 mg total) by mouth 2 (two) times daily. 04/26/17   Dartha Lodge, PA-C  PARoxetine HCl (PAXIL PO) Take by mouth.    [provider]  telmisartan (MICARDIS) 20 MG tablet Take 1 tablet (20 mg total) by mouth daily. 04/26/17   Dartha Lodge, PA-C    Family History Family History  Problem Relation Age of Onset  . Cancer Father        BRAIN/PROSTATE/SKIN  . Hypertension Father   . Cancer Paternal Grandfather   . Heart failure Maternal Grandmother   . Diabetes Maternal Grandmother   . Hypertension Maternal Grandmother   . Hypertension Maternal Grandfather     Social History Social History   Tobacco Use  . Smoking status: Current Every Day Smoker    Packs/day:  1.00    Years: 30.00    Pack years: 30.00    Types: Cigarettes  . Smokeless tobacco: Former Engineer, waterUser  Substance Use Topics  . Alcohol use: No  . Drug use: No     Allergies   Patient has no known allergies.   Review of Systems Review of Systems  Constitutional: Negative for chills and fever.  Gastrointestinal: Negative for abdominal pain, anal bleeding, nausea and vomiting.  Musculoskeletal: Negative for back pain.  Skin: Negative for rash and wound.  All other systems reviewed and are negative.    Physical Exam Updated Vital  Signs BP 138/84   Pulse (!) 103   Temp 98.8 F (37.1 C) (Oral)   Resp 18   Ht 5' 8.5" (1.74 m)   Wt 113.4 kg (250 lb)   SpO2 96%   BMI 37.46 kg/m   Physical Exam  Constitutional: He is oriented to person, place, and time. He appears well-developed and well-nourished.  HENT:  Head: Normocephalic and atraumatic.  Mouth/Throat: Oropharynx is clear and moist.  Eyes: Pupils are equal, round, and reactive to light. EOM are normal.  Neck: Normal range of motion. Neck supple.  Cardiovascular: Normal rate and regular rhythm.  Pulmonary/Chest: Effort normal and breath sounds normal.  Abdominal: Soft. Bowel sounds are normal. There is no tenderness. There is no rebound and no guarding.  Genitourinary:     Musculoskeletal: Normal range of motion. He exhibits no edema or tenderness.  Neurological: He is alert and oriented to person, place, and time.  Skin: Skin is warm and dry. No rash noted. No erythema.  Psychiatric: He has a normal mood and affect. His behavior is normal.  Nursing note and vitals reviewed.    ED Treatments / Results  Labs (all labs ordered are listed, but only abnormal results are displayed) Labs Reviewed - No data to display  EKG None  Radiology No results found.  Procedures .Marland Kitchen.Incision and Drainage Date/Time: 09/04/2017 5:23 PM Performed by: Loren RacerYelverton, David, MD Authorized by: Loren RacerYelverton, David, MD   Consent:    Consent given by:  Patient Location:    Type:  Abscess   Location:  Anogenital   Anogenital location:  Perirectal Anesthesia (see MAR for exact dosages):    Anesthesia method:  Local infiltration   Local anesthetic:  Lidocaine 2% w/o epi Procedure type:    Complexity:  Simple Procedure details:    Incision types:  Stab incision   Scalpel blade:  11   Wound management:  Probed and deloculated   Drainage:  Purulent   Drainage amount:  Scant   Wound treatment:  Wound left open Post-procedure details:    Patient tolerance of procedure:   Tolerated well, no immediate complications   (including critical care time)  Medications Ordered in ED Medications  HYDROcodone-acetaminophen (NORCO/VICODIN) 5-325 MG per tablet 1 tablet (1 tablet Oral Given 09/04/17 1648)  lidocaine (XYLOCAINE) 2 % (with pres) injection 200 mg (200 mg Infiltration Given by Other 09/04/17 1648)     Initial Impression / Assessment and Plan / ED Course  I have reviewed the triage vital signs and the nursing notes.  Pertinent labs & imaging results that were available during my care of the patient were reviewed by me and considered in my medical decision making (see chart for details).     Incision and drainage of perirectal abscess.  Tolerated well.  Small amount of bleeding after the procedure.  We will have patient return in 2 to 3 days to  be reevaluated.  Return precautions given.  Final Clinical Impressions(s) / ED Diagnoses   Final diagnoses:  Perirectal abscess    ED Discharge Orders        Ordered    HYDROcodone-acetaminophen (NORCO) 5-325 MG tablet  Every 6 hours PRN     09/04/17 1727    clindamycin (CLEOCIN) 300 MG capsule  4 times daily     09/04/17 1727       Loren Racer, MD 09/04/17 1727

## 2017-09-04 NOTE — ED Triage Notes (Signed)
Abscess to his anus with pain and drainage x 1 week.

## 2017-09-07 ENCOUNTER — Encounter (HOSPITAL_BASED_OUTPATIENT_CLINIC_OR_DEPARTMENT_OTHER): Payer: Self-pay

## 2017-09-07 ENCOUNTER — Other Ambulatory Visit: Payer: Self-pay

## 2017-09-07 ENCOUNTER — Emergency Department (HOSPITAL_BASED_OUTPATIENT_CLINIC_OR_DEPARTMENT_OTHER)
Admission: EM | Admit: 2017-09-07 | Discharge: 2017-09-07 | Disposition: A | Discharge status: Home / Self Care | Payer: BLUE CROSS/BLUE SHIELD | Attending: Emergency Medicine | Admitting: Emergency Medicine

## 2017-09-07 DIAGNOSIS — Z79899 Other long term (current) drug therapy: Secondary | ICD-10-CM | POA: Diagnosis not present

## 2017-09-07 DIAGNOSIS — F17210 Nicotine dependence, cigarettes, uncomplicated: Secondary | ICD-10-CM | POA: Insufficient documentation

## 2017-09-07 DIAGNOSIS — Z4801 Encounter for change or removal of surgical wound dressing: Secondary | ICD-10-CM | POA: Insufficient documentation

## 2017-09-07 DIAGNOSIS — L539 Erythematous condition, unspecified: Secondary | ICD-10-CM | POA: Diagnosis not present

## 2017-09-07 DIAGNOSIS — I10 Essential (primary) hypertension: Secondary | ICD-10-CM | POA: Insufficient documentation

## 2017-09-07 DIAGNOSIS — Z5189 Encounter for other specified aftercare: Secondary | ICD-10-CM

## 2017-09-07 NOTE — Discharge Instructions (Signed)
Wound Care - After I&D of Abscess  Bandages, if used, should be replaced daily or whenever soiled. The wound may continue to drain for the next 2-3 days.   Clean the wound and surrounding area gently with tap water and mild soap. Rinse well and blot dry. You may shower normally. Soaking the wound in Epsom salt baths for no more than 15 minutes once a day may help rinse out any remaining pus and help with wound healing.  Clean the wound daily to prevent further infection. Do not use cleaners such as hydrogen peroxide or alcohol.   Scar reduction: Application of a topical antibiotic ointment, such as Neosporin, after the wound has begun to close and heal well can decrease scab formation and reduce scarring. After the wound has healed, application of ointments such as Aquaphor can also reduce scar formation.  The key to scar reduction is keeping the skin well hydrated and supple. Drinking plenty of water throughout the day (At least eight 8oz glasses of water a day) is essential to staying well hydrated.  Sun exposure: Keep the wound out of the sun. After the wound has healed, continue to protect it from the sun by wearing protective clothing or applying sunscreen.  Pain: You may use Tylenol, naproxen, or ibuprofen for pain.  Follow up: Follow-up with a primary care provider for any further management of this issue.  Return to the ED sooner should signs of worsening infection arise, such as spreading redness, worsening puffiness/swelling, severe increase in pain, fever over 100.30F, or any other major issues.

## 2017-09-07 NOTE — ED Notes (Signed)
Signature pad not working at [employer] time, pt unable to sign.

## 2017-09-07 NOTE — ED Triage Notes (Signed)
Pt returns with abcess to buttocks-still draining. Seen Friday for same. Pt started on abx. Denies fevers.

## 2017-09-07 NOTE — ED Provider Notes (Signed)
[facility] EMERGENCY DEPARTMENT Provider Note   CSN: [id number] Arrival date & time: 09/07/17  1623     History   Chief Complaint Chief Complaint  Patient presents with  . Abscess    HPI Juliann ParesRichard M Palin is a 52 y.o. male.  HPI   Juliann ParesRichard M Richardson is a 52 y.o. male, presenting to the ED for a wound check of a perirectal abscess following I&D.  I&D was performed July 12.  Patient has had some continued drainage from the region.  Continues with some soreness in the area.  Denies fever, increased drainage, increased pain, or any other complaints.      Past Medical History:  Diagnosis Date  . Arthritis   . Depression   . Gout   . Hypercholesterolemia   . Hypertension    on meds  . Kidney stone   . Sleep apnea    uses CPAP    Patient Active Problem List   Diagnosis Date Noted  . 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 Weakness of left leg 11/04/2013  . Paresthesias 11/04/2013  . Lumbar radiculopathy 11/04/2013  . Degenerative disc disease, lumbar 11/04/2013  . ONYCHOMYCOSIS 12/23/2006  . HYPERLIPIDEMIA 12/23/2006  . ANXIETY DISORDER, GENERALIZED 12/23/2006  . TOBACCO ABUSE 12/23/2006  . SYNDROME, CARPAL TUNNEL 12/23/2006  . HYPERTENSION 12/23/2006  . NEPHROLITHIASIS, HX OF 12/23/2006    Past Surgical History:  Procedure Laterality Date  . FOOT SURGERY Right 1991  . HAND SURGERY Right 1985  . POSTERIOR LUMBAR FUSION 4 LEVEL N/A 11/29/2013   Procedure: Thoracic Eight, Thoracic Nine, Costotransversectomy   Thoracic Seven-Eight Thoracic Eight-Nine diskectomy;  Surgeon: Lisbeth RenshawNeelesh Nundkumar, MD;  Location: [facility];  Service: Neurosurgery;  Laterality: N/A;  Thoracic Eight, Thoracic Nine, Costotransversectomy   Thoracic Seven-Eight Thoracic Eight-Nine diskectomy        Home Medications    Prior to Admission medications   Medication Sig Start Date End Date Taking? Authorizing Provider  benzonatate  (TESSALON) 100 MG capsule Take 1 capsule (100 mg total) by mouth every 8 (eight) hours. 08/10/17  Yes Maxwell CaulLayden, Lindsey A, PA-C  clindamycin (CLEOCIN) 300 MG capsule Take 1 capsule (300 mg total) by mouth 4 (four) times daily. X 7 days 09/04/17  Yes Loren RacerYelverton, David, MD  Fexofenadine HCl [facility]RA ALLERGY PO) Take by mouth.   Yes [provider]  fluticasone (FLONASE) 50 MCG/ACT nasal spray Place 1 spray into both nostrils daily. 08/10/17  Yes Maxwell CaulLayden, Lindsey A, PA-C  HYDRALAZINE-HCTZ PO Take by mouth.   Yes [provider]  HYDROcodone-acetaminophen (NORCO) 5-325 MG tablet Take 1 tablet by mouth every 6 (six) hours as needed for severe pain. 09/04/17  Yes Loren RacerYelverton, David, MD  methocarbamol (ROBAXIN) 500 MG tablet Take 1 tablet (500 mg total) by mouth 2 (two) times daily. 04/26/17  Yes Dartha LodgeFord, Kelsey N, PA-C  PARoxetine HCl (PAXIL PO) Take by mouth.   Yes [provider]  telmisartan (MICARDIS) 20 MG tablet Take 1 tablet (20 mg total) by mouth daily. 04/26/17  Yes Dartha LodgeFord, Kelsey N, PA-C  hydrochlorothiazide (HYDRODIURIL) 50 MG tablet Take 1 tablet (50 mg total) by mouth daily. 04/26/17   Dartha LodgeFord, Kelsey N, PA-C    Family History Family History  Problem Relation Age of Onset  . Cancer Father        BRAIN/PROSTATE/SKIN  . Hypertension Father   . Cancer Paternal Grandfather   . Heart failure Maternal Grandmother   . Diabetes Maternal Grandmother   .  Hypertension Maternal Grandmother   . Hypertension Maternal Grandfather     Social History Social History   Tobacco Use  . Smoking status: Current Every Day Smoker    Packs/day: 1.00    Years: 30.00    Pack years: 30.00    Types: Cigarettes  . Smokeless tobacco: Former Engineer, water Use Topics  . Alcohol use: No  . Drug use: No     Allergies   Patient has no known allergies.   Review of Systems Review of Systems  Constitutional: Negative for fever.  Skin: Positive for wound.     Physical Exam Updated Vital  Signs BP (!) 148/98   Pulse 96   Temp 98.1 F (36.7 C) (Oral)   Resp 18   Ht 5\' 8"  (1.727 m)   Wt 113.4 kg (250 lb)   SpO2 98%   BMI 38.01 kg/m   Physical Exam  Constitutional: He appears well-developed and well-nourished. No distress.  HENT:  Head: Normocephalic and atraumatic.  Eyes: Conjunctivae are normal.  Neck: Neck supple.  Cardiovascular: Normal rate and regular rhythm.  Pulmonary/Chest: Effort normal.  Abdominal: Soft. There is no tenderness. There is no guarding.  Neurological: He is alert.  Skin: Skin is warm and dry. He is not diaphoretic. No pallor.  Patient has a perirectal area of continued drainage.  Mild tenderness.  No noted fluctuance.  Minor erythema.  Psychiatric: He has a normal mood and affect. His behavior is normal.  Nursing note and vitals reviewed.    ED Treatments / Results  Labs (all labs ordered are listed, but only abnormal results are displayed) Labs Reviewed - No data to display  EKG None  Radiology No results found.  Procedures Procedures (including critical care time)  Medications Ordered in ED Medications - No data to display   Initial Impression / Assessment and Plan / ED Course  I have reviewed the triage vital signs and the nursing notes.  Pertinent labs & imaging results that were available during my care of the patient were reviewed by me and considered in my medical decision making (see chart for details).     Patient presents for a wound check following I&D of an abscess.  Drainage continues.  No evidence of worsening cellulitis.  Patient voices improvement.  Continue with original management.  PCP follow-up for any further.  Final Clinical Impressions(s) / ED Diagnoses   Final diagnoses:  Visit for wound check    ED Discharge Orders    None       Concepcion Living 09/07/17 2049    Vanetta Mulders, MD 09/08/17 [phone]

## 2017-09-11 ENCOUNTER — Encounter (HOSPITAL_BASED_OUTPATIENT_CLINIC_OR_DEPARTMENT_OTHER): Payer: Self-pay

## 2017-09-11 ENCOUNTER — Emergency Department (HOSPITAL_BASED_OUTPATIENT_CLINIC_OR_DEPARTMENT_OTHER)
Admission: EM | Admit: 2017-09-11 | Discharge: 2017-09-11 | Disposition: A | Discharge status: Home / Self Care | Payer: BLUE CROSS/BLUE SHIELD | Attending: Emergency Medicine | Admitting: Emergency Medicine

## 2017-09-11 ENCOUNTER — Other Ambulatory Visit: Payer: Self-pay

## 2017-09-11 DIAGNOSIS — Z79899 Other long term (current) drug therapy: Secondary | ICD-10-CM | POA: Diagnosis not present

## 2017-09-11 DIAGNOSIS — K611 Rectal abscess: Secondary | ICD-10-CM | POA: Diagnosis not present

## 2017-09-11 DIAGNOSIS — F17210 Nicotine dependence, cigarettes, uncomplicated: Secondary | ICD-10-CM | POA: Diagnosis not present

## 2017-09-11 DIAGNOSIS — L0291 Cutaneous abscess, unspecified: Secondary | ICD-10-CM | POA: Diagnosis not present

## 2017-09-11 DIAGNOSIS — I10 Essential (primary) hypertension: Secondary | ICD-10-CM | POA: Diagnosis not present

## 2017-09-11 MED ORDER — SULFAMETHOXAZOLE-TRIMETHOPRIM 800-160 MG PO TABS: 1.0000 | ORAL_TABLET | Freq: Two times a day (BID) | ORAL | 0 refills | Status: AC

## 2017-09-11 MED FILL — SULFAMETHOXAZOLE-TMP DS TAB: 800-160 | 7 days supply | Qty: 14 | Fill #0

## 2017-09-11 NOTE — ED Provider Notes (Signed)
[facility] EMERGENCY DEPARTMENT Provider Note   CSN: [id number] Arrival date & time: 09/11/17  1353     History   Chief Complaint Chief Complaint  Patient presents with  . Follow-up    HPI James Downs is a 52 y.o. male.  52 yo M with a chief complaint of a abscess.  This was lanced about a week ago and this is his second follow-up visit here in the emergency department.  He denies fevers or chills denies worsening.  He is concerned because it continues to drain.  He feels that it should have stopped by now.  He has been taking his antibiotics though has not been taking them regularly.  Not doing warm compresses, though has been taking frequent warm baths.  The history is provided by the patient.  Illness  This is a recurrent problem. The current episode started more than 1 week ago. The problem occurs constantly. The problem has not changed since onset.Pertinent negatives include no chest pain, no abdominal pain, no headaches and no shortness of breath. Nothing aggravates the symptoms. Nothing relieves the symptoms. He has tried nothing for the symptoms. The treatment provided no relief.    Past Medical History:  Diagnosis Date  . Arthritis   . Depression   . Gout   . Hypercholesterolemia   . Hypertension    on meds  . Kidney stone   . Sleep apnea    uses CPAP    Patient Active Problem List   Diagnosis Date Noted  . 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 Weakness of left leg 11/04/2013  . Paresthesias 11/04/2013  . Lumbar radiculopathy 11/04/2013  . Degenerative disc disease, lumbar 11/04/2013  . ONYCHOMYCOSIS 12/23/2006  . HYPERLIPIDEMIA 12/23/2006  . ANXIETY DISORDER, GENERALIZED 12/23/2006  . TOBACCO ABUSE 12/23/2006  . SYNDROME, CARPAL TUNNEL 12/23/2006  . HYPERTENSION 12/23/2006  . NEPHROLITHIASIS, HX OF 12/23/2006    Past Surgical History:  Procedure Laterality Date  . FOOT SURGERY Right 1991  .  HAND SURGERY Right 1985  . POSTERIOR LUMBAR FUSION 4 LEVEL N/A 11/29/2013   Procedure: Thoracic Eight, Thoracic Nine, Costotransversectomy   Thoracic Seven-Eight Thoracic Eight-Nine diskectomy;  Surgeon: Lisbeth Renshaw, MD;  Location: [facility];  Service: Neurosurgery;  Laterality: N/A;  Thoracic Eight, Thoracic Nine, Costotransversectomy   Thoracic Seven-Eight Thoracic Eight-Nine diskectomy        Home Medications    Prior to Admission medications   Medication Sig Start Date End Date Taking? Authorizing Provider  benzonatate (TESSALON) 100 MG capsule Take 1 capsule (100 mg total) by mouth every 8 (eight) hours. 08/10/17   Maxwell Caul, PA-C  clindamycin (CLEOCIN) 300 MG capsule Take 1 capsule (300 mg total) by mouth 4 (four) times daily. X 7 days 09/04/17   Loren Racer, MD  Fexofenadine HCl [facility] ALLERGY PO) Take by mouth.    [provider]  fluticasone (FLONASE) 50 MCG/ACT nasal spray Place 1 spray into both nostrils daily. 08/10/17   Maxwell Caul, PA-C  HYDRALAZINE-HCTZ PO Take by mouth.    [provider]  hydrochlorothiazide (HYDRODIURIL) 50 MG tablet Take 1 tablet (50 mg total) by mouth daily. 04/26/17   Dartha Lodge, PA-C  HYDROcodone-acetaminophen (NORCO) 5-325 MG tablet Take 1 tablet by mouth every 6 (six) hours as needed for severe pain. 09/04/17   Loren Racer, MD  methocarbamol (ROBAXIN) 500 MG tablet Take 1 tablet (500 mg total) by mouth 2 (two) times daily. 04/26/17  Dartha LodgeFord, Kelsey N, PA-C  PARoxetine HCl (PAXIL PO) Take by mouth.    [provider]  sulfamethoxazole-trimethoprim (BACTRIM DS,SEPTRA DS) 800-160 MG tablet Take 1 tablet by mouth 2 (two) times daily for 7 days. 09/11/17 09/18/17  Melene PlanFloyd, Dan, DO  telmisartan (MICARDIS) 20 MG tablet Take 1 tablet (20 mg total) by mouth daily. 04/26/17   Dartha LodgeFord, Kelsey N, PA-C    Family History Family History  Problem Relation Age of Onset  . Cancer Father        BRAIN/PROSTATE/SKIN  .  Hypertension Father   . Cancer Paternal Grandfather   . Heart failure Maternal Grandmother   . Diabetes Maternal Grandmother   . Hypertension Maternal Grandmother   . Hypertension Maternal Grandfather     Social History Social History   Tobacco Use  . Smoking status: Current Every Day Smoker    Packs/day: 1.00    Years: 30.00    Pack years: 30.00    Types: Cigarettes  . Smokeless tobacco: Former Engineer, waterUser  Substance Use Topics  . Alcohol use: No  . Drug use: No     Allergies   Patient has no known allergies.   Review of Systems Review of Systems  Constitutional: Negative for chills and fever.  HENT: Negative for congestion and facial swelling.   Eyes: Negative for discharge and visual disturbance.  Respiratory: Negative for shortness of breath.   Cardiovascular: Negative for chest pain and palpitations.  Gastrointestinal: Negative for abdominal pain, diarrhea and vomiting.  Musculoskeletal: Negative for arthralgias and myalgias.  Skin: Positive for wound. Negative for color change and rash.  Neurological: Negative for tremors, syncope and headaches.  Psychiatric/Behavioral: Negative for confusion and dysphoric mood.     Physical Exam Updated Vital Signs BP (!) 171/109 (BP Location: Left Arm)   Pulse (!) 106   Temp 98.5 F (36.9 C) (Oral)   Resp 18   Ht 5\' 8"  (1.727 m)   Wt 112 kg (247 lb)   SpO2 98%   BMI 37.56 kg/m   Physical Exam  Constitutional: He is oriented to person, place, and time. He appears well-developed and well-nourished.  HENT:  Head: Normocephalic and atraumatic.  Eyes: Pupils are equal, round, and reactive to light. EOM are normal.  Neck: Normal range of motion. Neck supple. No JVD present.  Cardiovascular: Normal rate and regular rhythm. Exam reveals no gallop and no friction rub.  No murmur heard. Pulmonary/Chest: No respiratory distress. He has no wheezes.  Abdominal: He exhibits no distension. There is no rebound and no guarding.    Genitourinary:     Musculoskeletal: Normal range of motion.  Neurological: He is alert and oriented to person, place, and time.  Skin: No rash noted. No pallor.  Psychiatric: He has a normal mood and affect. His behavior is normal.  Nursing note and vitals reviewed.    ED Treatments / Results  Labs (all labs ordered are listed, but only abnormal results are displayed) Labs Reviewed - No data to display  EKG None  Radiology No results found.  Procedures .Marland Kitchen.Incision and Drainage Date/Time: 09/11/2017 4:12 PM Performed by: Melene PlanFloyd, Dan, DO Authorized by: Melene PlanFloyd, Dan, DO   Consent:    Consent obtained:  Verbal   Consent given by:  Patient   Risks discussed:  Bleeding, incomplete drainage and infection   Alternatives discussed:  Delayed treatment and no treatment Location:    Type:  Abscess   Location:  Anogenital   Anogenital location:  Perirectal Anesthesia (see MAR  for exact dosages):    Anesthesia method:  None Procedure type:    Complexity:  Simple Procedure details:    Drainage amount:  Scant   Wound treatment:  Wound left open Post-procedure details:    Patient tolerance of procedure:  Tolerated well, no immediate complications Comments:     Mechanically opened at bedside, patient unwilling to repeat I&D   (including critical care time)  Medications Ordered in ED Medications - No data to display   Initial Impression / Assessment and Plan / ED Course  I have reviewed the triage vital signs and the nursing notes.  Pertinent labs & imaging results that were available during my care of the patient were reviewed by me and considered in my medical decision making (see chart for details).     52 yo M with a chief complaint of abscess.  Patient had this lanced about a week ago and this is a second visit afterwards.  The abscess looks like it is partially closed I offered to re-I&D at which she is declined.  His antibiotics are completed and so he will start a  second course since the abscess seems to have partially reaccumulated with surrounding induration.  We will have him do warm compresses which she was not doing and have him follow-up with his PCP.  4:14 PM:  I have discussed the diagnosis/risks/treatment options with the patient and believe the pt to be eligible for discharge home to follow-up with PCP. We also discussed returning to the ED immediately if new or worsening sx occur. We discussed the sx which are most concerning (e.g., sudden worsening pain, fever, rapid spreading redness inability to tolerate by mouth) that necessitate immediate return. Medications administered to the patient during their visit and any new prescriptions provided to the patient are listed below.  Medications given during this visit Medications - No data to display    The patient appears reasonably screen and/or stabilized for discharge and I doubt any other medical condition or other [facility] requiring further screening, evaluation, or treatment in the ED at this time prior to discharge.    Final Clinical Impressions(s) / ED Diagnoses   Final diagnoses:  Abscess    ED Discharge Orders        Ordered    sulfamethoxazole-trimethoprim (BACTRIM DS,SEPTRA DS) 800-160 MG tablet  2 times daily     09/11/17 1610       [place], DO 09/11/17 1614

## 2017-09-11 NOTE — Discharge Instructions (Signed)
Warm compresses 4 x a day to keep this open and draining.  Finish your current antibiotic and then start the new antibiotic if it is still there.

## 2017-09-11 NOTE — ED Triage Notes (Addendum)
Pt c/o perirectal abscess "is still draining"-was seen her 7/12 and 7/15-NAD-slow gait

## 2017-12-22 ENCOUNTER — Emergency Department (HOSPITAL_BASED_OUTPATIENT_CLINIC_OR_DEPARTMENT_OTHER)
Admission: EM | Admit: 2017-12-22 | Discharge: 2017-12-22 | Disposition: A | Discharge status: Home / Self Care | Payer: BLUE CROSS/BLUE SHIELD | Attending: Emergency Medicine | Admitting: Emergency Medicine

## 2017-12-22 ENCOUNTER — Other Ambulatory Visit: Payer: Self-pay

## 2017-12-22 ENCOUNTER — Encounter (HOSPITAL_BASED_OUTPATIENT_CLINIC_OR_DEPARTMENT_OTHER): Payer: Self-pay | Admitting: Emergency Medicine

## 2017-12-22 DIAGNOSIS — R05 Cough: Secondary | ICD-10-CM | POA: Diagnosis not present

## 2017-12-22 DIAGNOSIS — B9789 Other viral agents as the cause of diseases classified elsewhere: Secondary | ICD-10-CM

## 2017-12-22 DIAGNOSIS — F419 Anxiety disorder, unspecified: Secondary | ICD-10-CM | POA: Diagnosis not present

## 2017-12-22 DIAGNOSIS — J069 Acute upper respiratory infection, unspecified: Secondary | ICD-10-CM | POA: Diagnosis not present

## 2017-12-22 DIAGNOSIS — F329 Major depressive disorder, single episode, unspecified: Secondary | ICD-10-CM | POA: Insufficient documentation

## 2017-12-22 DIAGNOSIS — Z79899 Other long term (current) drug therapy: Secondary | ICD-10-CM | POA: Insufficient documentation

## 2017-12-22 DIAGNOSIS — I10 Essential (primary) hypertension: Secondary | ICD-10-CM | POA: Diagnosis not present

## 2017-12-22 DIAGNOSIS — J988 Other specified respiratory disorders: Secondary | ICD-10-CM

## 2017-12-22 DIAGNOSIS — F17210 Nicotine dependence, cigarettes, uncomplicated: Secondary | ICD-10-CM | POA: Insufficient documentation

## 2017-12-22 MED ORDER — BENZONATATE 100 MG PO CAPS: 200.0000 mg | ORAL_CAPSULE | Freq: Two times a day (BID) | ORAL | 0 refills | Status: AC | PRN

## 2017-12-22 NOTE — ED Notes (Signed)
ED Provider at bedside. 

## 2017-12-22 NOTE — ED Triage Notes (Signed)
Cough, sneezing, body aches since yesterday

## 2017-12-22 NOTE — ED Provider Notes (Signed)
[facility] EMERGENCY DEPARTMENT Provider Note   CSN: [id number] Arrival date & time: 12/22/17  1738     History   Chief Complaint Chief Complaint  Patient presents with  . Flu like symptoms    HPI James Downs is a 52 y.o. male.  52yo M w/ PMH including HTN, HLD, kidney stones, OSA who p/w cough. Yesterday he began having cough associated with sneezing, sore throat, nasal congestion, runny nose, and generalized body aches.  He is not aware of any fevers.  He had nausea yesterday but not today, no vomiting or diarrhea.  His wife has been sick with similar symptoms.  He has tried NyQuil and Afrin with modest relief.  No recent travel.  No rash.  No chest pain or shortness of breath.  The history is provided by the patient.    Past Medical History:  Diagnosis Date  . Arthritis   . Depression   . Gout   . Hypercholesterolemia   . Hypertension    on meds  . Kidney stone   . Sleep apnea    uses CPAP    Patient Active Problem List   Diagnosis Date Noted  . 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 Weakness of left leg 11/04/2013  . Paresthesias 11/04/2013  . Lumbar radiculopathy 11/04/2013  . Degenerative disc disease, lumbar 11/04/2013  . ONYCHOMYCOSIS 12/23/2006  . HYPERLIPIDEMIA 12/23/2006  . ANXIETY DISORDER, GENERALIZED 12/23/2006  . TOBACCO ABUSE 12/23/2006  . SYNDROME, CARPAL TUNNEL 12/23/2006  . HYPERTENSION 12/23/2006  . NEPHROLITHIASIS, HX OF 12/23/2006    Past Surgical History:  Procedure Laterality Date  . FOOT SURGERY Right 1991  . HAND SURGERY Right 1985  . POSTERIOR LUMBAR FUSION 4 LEVEL N/A 11/29/2013   Procedure: Thoracic Eight, Thoracic Nine, Costotransversectomy   Thoracic Seven-Eight Thoracic Eight-Nine diskectomy;  Surgeon: Lisbeth Renshaw, MD;  Location: [facility];  Service: Neurosurgery;  Laterality: N/A;  Thoracic Eight, Thoracic Nine, Costotransversectomy   Thoracic Seven-Eight Thoracic  Eight-Nine diskectomy        Home Medications    Prior to Admission medications   Medication Sig Start Date End Date Taking? Authorizing Provider  benzonatate (TESSALON) 100 MG capsule Take 2 capsules (200 mg total) by mouth 2 (two) times daily as needed for up to 4 days for cough. 12/22/17 12/26/17  Manson [place], Ambrose Finland, MD  clindamycin (CLEOCIN) 300 MG capsule Take 1 capsule (300 mg total) by mouth 4 (four) times daily. X 7 days 09/04/17   Loren Racer, MD  Fexofenadine HCl [facility] ALLERGY PO) Take by mouth.    [provider]  fluticasone (FLONASE) 50 MCG/ACT nasal spray Place 1 spray into both nostrils daily. 08/10/17   Maxwell Caul, PA-C  HYDRALAZINE-HCTZ PO Take by mouth.    [provider]  hydrochlorothiazide (HYDRODIURIL) 50 MG tablet Take 1 tablet (50 mg total) by mouth daily. 04/26/17   Dartha Lodge, PA-C  HYDROcodone-acetaminophen (NORCO) 5-325 MG tablet Take 1 tablet by mouth every 6 (six) hours as needed for severe pain. 09/04/17   Loren Racer, MD  methocarbamol (ROBAXIN) 500 MG tablet Take 1 tablet (500 mg total) by mouth 2 (two) times daily. 04/26/17   Dartha Lodge, PA-C  PARoxetine HCl (PAXIL PO) Take by mouth.    [provider]  telmisartan (MICARDIS) 20 MG tablet Take 1 tablet (20 mg total) by mouth daily. 04/26/17   Dartha Lodge, PA-C    Family History Family History  Problem Relation Age of Onset  . Cancer Father        BRAIN/PROSTATE/SKIN  . Hypertension Father   . Cancer Paternal Grandfather   . Heart failure Maternal Grandmother   . Diabetes Maternal Grandmother   . Hypertension Maternal Grandmother   . Hypertension Maternal Grandfather     Social History Social History   Tobacco Use  . Smoking status: Current Every Day Smoker    Packs/day: 1.00    Years: 30.00    Pack years: 30.00    Types: Cigarettes  . Smokeless tobacco: Former Engineer, water Use Topics  . Alcohol use: No  . Drug use: No      Allergies   Patient has no known allergies.   Review of Systems Review of Systems All other systems reviewed and are negative except that which was mentioned in HPI   Physical Exam Updated Vital Signs BP (!) 176/108 (BP Location: Right Arm)   Pulse 82   Temp 98 F (36.7 C) (Oral)   Resp (!) 24   Ht 5\' 8"  (1.727 m)   Wt 113.4 kg   SpO2 96%   BMI 38.01 kg/m   Physical Exam  Constitutional: He is oriented to person, place, and time. He appears well-developed and well-nourished. No distress.  HENT:  Head: Normocephalic and atraumatic.  Moist mucous membranes Erythema posterior oropharynx without exudates + nasal congestion  Eyes: Conjunctivae are normal.  Neck: Neck supple.  Cardiovascular: Normal rate, regular rhythm and normal heart sounds.  No murmur heard. Pulmonary/Chest: Effort normal and breath sounds normal.  Abdominal: Soft. Bowel sounds are normal. He exhibits no distension. There is no tenderness.  Musculoskeletal: He exhibits no edema.  Neurological: He is alert and oriented to person, place, and time.  Fluent speech  Skin: Skin is warm and dry.  Psychiatric: He has a normal mood and affect. Judgment normal.  Nursing note and vitals reviewed.    ED Treatments / Results  Labs (all labs ordered are listed, but only abnormal results are displayed) Labs Reviewed - No data to display  EKG None  Radiology No results found.  Procedures Procedures (including critical care time)  Medications Ordered in ED Medications - No data to display   Initial Impression / Assessment and Plan / ED Course  I have reviewed the triage vital signs and the nursing notes.  Comfortable on exam, reassuring VS, afebrile. Clear breath sounds and normal WOB. Given 1 day of symptoms w/ reassuring pulmonary exam, I feel pneumonia is very unlikely and do not feel that he needs chest x-ray currently.  Symptoms are consistent with viral URI.  Have discussed supportive  measures including over-the-counter medications, nasal saline washes, humidifier.  Provided with Tessalon to use as needed.  Reviewed return precautions regarding any respiratory compromise or other sudden changes.  Patient voiced understanding.  Final Clinical Impressions(s) / ED Diagnoses   Final diagnoses:  Viral respiratory infection    ED Discharge Orders         Ordered    benzonatate (TESSALON) 100 MG capsule  2 times daily PRN     12/22/17 1957           Ariannie Penaloza, Ambrose Finland, MD 12/22/17 [phone]

## 2018-02-08 ENCOUNTER — Emergency Department (HOSPITAL_BASED_OUTPATIENT_CLINIC_OR_DEPARTMENT_OTHER)
Admission: EM | Admit: 2018-02-08 | Discharge: 2018-02-08 | Disposition: A | Discharge status: Home / Self Care | Payer: BLUE CROSS/BLUE SHIELD | Attending: Emergency Medicine | Admitting: Emergency Medicine

## 2018-02-08 ENCOUNTER — Encounter (HOSPITAL_BASED_OUTPATIENT_CLINIC_OR_DEPARTMENT_OTHER): Payer: Self-pay | Admitting: *Deleted

## 2018-02-08 ENCOUNTER — Other Ambulatory Visit: Payer: Self-pay

## 2018-02-08 DIAGNOSIS — R05 Cough: Secondary | ICD-10-CM | POA: Diagnosis not present

## 2018-02-08 DIAGNOSIS — J069 Acute upper respiratory infection, unspecified: Secondary | ICD-10-CM | POA: Diagnosis not present

## 2018-02-08 DIAGNOSIS — F17210 Nicotine dependence, cigarettes, uncomplicated: Secondary | ICD-10-CM | POA: Diagnosis not present

## 2018-02-08 DIAGNOSIS — Z79899 Other long term (current) drug therapy: Secondary | ICD-10-CM | POA: Insufficient documentation

## 2018-02-08 DIAGNOSIS — I10 Essential (primary) hypertension: Secondary | ICD-10-CM | POA: Insufficient documentation

## 2018-02-08 MED ORDER — ACETAMINOPHEN 325 MG PO TABS
650.0000 mg | ORAL_TABLET | Freq: Once | ORAL | Status: AC
  Administered 2018-02-08: 650 mg via ORAL
  Filled 2018-02-08: qty 2

## 2018-02-08 MED ORDER — GUAIFENESIN 100 MG/5ML PO LIQD: 100.0000 mg | Freq: Four times a day (QID) | ORAL | 0 refills | Status: DC | PRN

## 2018-02-08 MED ORDER — ONDANSETRON 4 MG PO TBDP: 4.0000 mg | ORAL_TABLET | Freq: Three times a day (TID) | ORAL | 0 refills | Status: DC | PRN

## 2018-02-08 MED ORDER — BENZONATATE 100 MG PO CAPS: 100.0000 mg | ORAL_CAPSULE | Freq: Three times a day (TID) | ORAL | 0 refills | Status: DC

## 2018-02-08 NOTE — ED Provider Notes (Signed)
[facility] EMERGENCY DEPARTMENT Provider Note   CSN: [id number] Arrival date & time: 02/08/18  2038     History   Chief Complaint Chief Complaint  Patient presents with  . Cough    HPI James Downs is a 52 y.o. male presenting for evaluation of fever, nasal congestion, cough, generalized body aches.  Patient states for the past 2 days, he has been feeling poorly.  He reports subjective fevers and chills.  He has nasal congestion, nonproductive cough, generalized body aches.  He has been taking DayQuil, NyQuil, and Afrin without significant improvement of his symptoms.  Patient states his grandchildren and his wife have similar symptoms.  He smokes cigarettes daily, denies history of asthma or COPD.  He denies ear pain, sore throat, chest pain, nausea, vomiting, abdominal pain, urinary symptoms, abnormal bowel movements.  He has a history of hypertension for which he intermittently takes blood pressure medication.  He takes no other medicines daily.  HPI  Past Medical History:  Diagnosis Date  . Arthritis   . Depression   . Gout   . Hypercholesterolemia   . Hypertension    on meds  . Kidney stone   . Sleep apnea    uses CPAP    Patient Active Problem List   Diagnosis Date Noted  . 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 Weakness of left leg 11/04/2013  . Paresthesias 11/04/2013  . Lumbar radiculopathy 11/04/2013  . Degenerative disc disease, lumbar 11/04/2013  . ONYCHOMYCOSIS 12/23/2006  . HYPERLIPIDEMIA 12/23/2006  . ANXIETY DISORDER, GENERALIZED 12/23/2006  . TOBACCO ABUSE 12/23/2006  . SYNDROME, CARPAL TUNNEL 12/23/2006  . HYPERTENSION 12/23/2006  . NEPHROLITHIASIS, HX OF 12/23/2006    Past Surgical History:  Procedure Laterality Date  . FOOT SURGERY Right 1991  . HAND SURGERY Right 1985  . POSTERIOR LUMBAR FUSION 4 LEVEL N/A 11/29/2013   Procedure: Thoracic Eight, Thoracic Nine, Costotransversectomy    Thoracic Seven-Eight Thoracic Eight-Nine diskectomy;  Surgeon: Lisbeth Renshaw, MD;  Location: [facility];  Service: Neurosurgery;  Laterality: N/A;  Thoracic Eight, Thoracic Nine, Costotransversectomy   Thoracic Seven-Eight Thoracic Eight-Nine diskectomy        Home Medications    Prior to Admission medications   Medication Sig Start Date End Date Taking? Authorizing Provider  hydrochlorothiazide (HYDRODIURIL) 50 MG tablet Take 1 tablet (50 mg total) by mouth daily. 04/26/17  Yes Dartha Lodge, PA-C  PARoxetine HCl (PAXIL PO) Take by mouth.   Yes [provider]  telmisartan (MICARDIS) 20 MG tablet Take 1 tablet (20 mg total) by mouth daily. 04/26/17  Yes Dartha Lodge, PA-C  benzonatate (TESSALON) 100 MG capsule Take 1 capsule (100 mg total) by mouth every 8 (eight) hours. 02/08/18   Caccavale, Sophia, PA-C  clindamycin (CLEOCIN) 300 MG capsule Take 1 capsule (300 mg total) by mouth 4 (four) times daily. X 7 days 09/04/17   Loren Racer, MD  Fexofenadine HCl [facility] ALLERGY PO) Take by mouth.    [provider]  fluticasone (FLONASE) 50 MCG/ACT nasal spray Place 1 spray into both nostrils daily. 08/10/17   Maxwell Caul, PA-C  guaiFENesin (ROBITUSSIN) 100 MG/5ML liquid Take 5 mLs (100 mg total) by mouth every 6 (six) hours as needed for cough. 02/08/18   Caccavale, Sophia, PA-C  HYDRALAZINE-HCTZ PO Take by mouth.    [provider]  HYDROcodone-acetaminophen (NORCO) 5-325 MG tablet Take 1 tablet by mouth every 6 (six) hours as needed for severe  pain. 09/04/17   Loren Racer, MD  methocarbamol (ROBAXIN) 500 MG tablet Take 1 tablet (500 mg total) by mouth 2 (two) times daily. 04/26/17   Dartha Lodge, PA-C    Family History Family History  Problem Relation Age of Onset  . Cancer Father        BRAIN/PROSTATE/SKIN  . Hypertension Father   . Cancer Paternal Grandfather   . Heart failure Maternal Grandmother   . Diabetes Maternal Grandmother   .  Hypertension Maternal Grandmother   . Hypertension Maternal Grandfather     Social History Social History   Tobacco Use  . Smoking status: Current Every Day Smoker    Packs/day: 1.00    Years: 30.00    Pack years: 30.00    Types: Cigarettes  . Smokeless tobacco: Former Engineer, water Use Topics  . Alcohol use: No  . Drug use: No     Allergies   Patient has no known allergies.   Review of Systems Review of Systems  Constitutional: Positive for chills and fever (subjective).  HENT: Positive for congestion, sinus pressure and sinus pain.   Respiratory: Positive for cough.   All other systems reviewed and are negative.    Physical Exam Updated Vital Signs BP (!) 147/82 (BP Location: Left Arm)   Pulse 93   Temp 99.7 F (37.6 C) (Oral)   Resp 20   Ht 5\' 6"  (1.676 m)   Wt 111.1 kg   SpO2 95%   BMI 39.54 kg/m   Physical Exam Vitals signs and nursing note reviewed.  Constitutional:      General: He is not in acute distress.    Appearance: He is well-developed.     Comments: Sitting in the bed in no acute distress  HENT:     Head: Normocephalic and atraumatic.     Comments: OP erythematous without tonsillar swelling or exudate.  Uvula midline with ago palate rise.  TMs nonerythematous nonbulging bilaterally.  Nasal mucosal edema/congestion.  Tenderness palpation maxillary sinuses, no tenderness palpation of the frontal sinuses. MM dry    Right Ear: Tympanic membrane, ear canal and external ear normal.     Left Ear: Tympanic membrane, ear canal and external ear normal.     Nose: Mucosal edema and congestion present.     Right Sinus: Maxillary sinus tenderness present. No frontal sinus tenderness.     Left Sinus: Maxillary sinus tenderness present. No frontal sinus tenderness.     Mouth/Throat:     Mouth: Mucous membranes are dry.     Pharynx: Uvula midline. Posterior oropharyngeal erythema present.     Tonsils: No tonsillar exudate. Swelling: 0 on the right. 0 on  the left.  Eyes:     Conjunctiva/sclera: Conjunctivae normal.     Pupils: Pupils are equal, round, and reactive to light.  Neck:     Musculoskeletal: Normal range of motion.  Cardiovascular:     Rate and Rhythm: Normal rate and regular rhythm.     Comments: Regular rate and rhythm Pulmonary:     Effort: Pulmonary effort is normal.     Breath sounds: Normal breath sounds. No decreased breath sounds, wheezing, rhonchi or rales.     Comments: Speaking in full sentences.  Clear lung sounds in all fields. Abdominal:     General: There is no distension.     Palpations: Abdomen is soft.     Tenderness: There is no abdominal tenderness.  Musculoskeletal: Normal range of motion.  Lymphadenopathy:  Cervical: No cervical adenopathy.  Skin:    General: Skin is warm.     Capillary Refill: Capillary refill takes less than 2 seconds.  Neurological:     Mental Status: He is alert and oriented to person, place, and time.      ED Treatments / Results  Labs (all labs ordered are listed, but only abnormal results are displayed) Labs Reviewed - No data to display  EKG None  Radiology No results found.  Procedures Procedures (including critical care time)  Medications Ordered in ED Medications  acetaminophen (TYLENOL) tablet 650 mg (650 mg Oral Given 02/08/18 2206)     Initial Impression / Assessment and Plan / ED Course  I have reviewed the triage vital signs and the nursing notes.  Pertinent labs & imaging results that were available during my care of the patient were reviewed by me and considered in my medical decision making (see chart for details).     Patient presenting with 3 day h/o URI symptoms.  Physical exam reassuring, patient is afebrile and appears nontoxic. Initially tachycardic, this improved with diaphoretic and fever reduction.  Likely related to fever and dehydration.  Pulmonary exam reassuring.  Doubt pneumonia, strep, other bacterial infection, or  peritonsillar abscess.  Likely viral URI.  Will treat symptomatically.  Patient to follow-up with primary care as needed.  Discussed smoking cessation. At this time, patient appears safe for discharge.  Return precautions given.  Patient states he understands and agrees to plan.   Final Clinical Impressions(s) / ED Diagnoses   Final diagnoses:  Upper respiratory tract infection, unspecified type    ED Discharge Orders         Ordered    ondansetron (ZOFRAN ODT) 4 MG disintegrating tablet  Every 8 hours PRN,   Status:  Discontinued     02/08/18 2249    benzonatate (TESSALON) 100 MG capsule  Every 8 hours     02/08/18 2309    guaiFENesin (ROBITUSSIN) 100 MG/5ML liquid  Every 6 hours PRN     02/08/18 2309           Caccavale, Sophia, PA-C 02/08/18 2318    Virgina Norfolkuratolo, Adam, DO 02/09/18 0158

## 2018-02-08 NOTE — ED Triage Notes (Signed)
Cough, sneezing, runny nose, chest congestion, body aches x 3 days.

## 2018-02-08 NOTE — Discharge Instructions (Addendum)
You likely have a viral illness.  This will be treated symptomatically. Use Tylenol or ibuprofen as needed for fevers or body aches. Use cough drops and syrups as needed.  Use nasal sprays daily for nasal congestion and cough. Make sure you stay well-hydrated with water. Wash your hands frequently to prevent spread of infection. Follow-up with your primary care doctor if your symptoms are not improving. Return to the emergency room if you develop difficulty breathing or any new, worsening, or concerning symptoms.

## 2018-02-11 ENCOUNTER — Encounter (HOSPITAL_BASED_OUTPATIENT_CLINIC_OR_DEPARTMENT_OTHER): Payer: Self-pay | Admitting: *Deleted

## 2018-02-11 ENCOUNTER — Emergency Department (HOSPITAL_BASED_OUTPATIENT_CLINIC_OR_DEPARTMENT_OTHER)
Admission: EM | Admit: 2018-02-11 | Discharge: 2018-02-11 | Disposition: A | Discharge status: Home / Self Care | Payer: BLUE CROSS/BLUE SHIELD | Attending: Emergency Medicine | Admitting: Emergency Medicine

## 2018-02-11 ENCOUNTER — Emergency Department (HOSPITAL_BASED_OUTPATIENT_CLINIC_OR_DEPARTMENT_OTHER): Payer: BLUE CROSS/BLUE SHIELD

## 2018-02-11 ENCOUNTER — Other Ambulatory Visit: Payer: Self-pay

## 2018-02-11 DIAGNOSIS — Z79899 Other long term (current) drug therapy: Secondary | ICD-10-CM | POA: Insufficient documentation

## 2018-02-11 DIAGNOSIS — I10 Essential (primary) hypertension: Secondary | ICD-10-CM | POA: Insufficient documentation

## 2018-02-11 DIAGNOSIS — R05 Cough: Secondary | ICD-10-CM | POA: Diagnosis not present

## 2018-02-11 DIAGNOSIS — J209 Acute bronchitis, unspecified: Secondary | ICD-10-CM | POA: Diagnosis not present

## 2018-02-11 DIAGNOSIS — F17210 Nicotine dependence, cigarettes, uncomplicated: Secondary | ICD-10-CM | POA: Diagnosis not present

## 2018-02-11 DIAGNOSIS — J4521 Mild intermittent asthma with (acute) exacerbation: Secondary | ICD-10-CM | POA: Diagnosis not present

## 2018-02-11 DIAGNOSIS — Z720 Tobacco use: Secondary | ICD-10-CM | POA: Diagnosis not present

## 2018-02-11 MED ORDER — HYDROCODONE-ACETAMINOPHEN 5-325 MG PO TABS: 1.0000 | ORAL_TABLET | ORAL | 0 refills | Status: DC | PRN

## 2018-02-11 MED ORDER — ALBUTEROL SULFATE HFA 108 (90 BASE) MCG/ACT IN AERS
1.0000 | INHALATION_SPRAY | RESPIRATORY_TRACT | Status: DC | PRN
  Administered 2018-02-11: 2 via RESPIRATORY_TRACT
  Filled 2018-02-11: qty 6.7

## 2018-02-11 MED ORDER — PREDNISONE 10 MG (21) PO TBPK: ORAL_TABLET | ORAL | 0 refills | Status: DC

## 2018-02-11 MED ORDER — KETOROLAC TROMETHAMINE 30 MG/ML IJ SOLN
30.0000 mg | Freq: Once | INTRAMUSCULAR | Status: AC
  Administered 2018-02-11: 30 mg via INTRAMUSCULAR
  Filled 2018-02-11: qty 1

## 2018-02-11 MED ORDER — AZITHROMYCIN 250 MG PO TABS
500.0000 mg | ORAL_TABLET | Freq: Once | ORAL | Status: AC
  Administered 2018-02-11: 500 mg via ORAL
  Filled 2018-02-11: qty 2

## 2018-02-11 MED ORDER — DEXAMETHASONE SODIUM PHOSPHATE 10 MG/ML IJ SOLN
10.0000 mg | Freq: Once | INTRAMUSCULAR | Status: AC
  Administered 2018-02-11: 10 mg via INTRAMUSCULAR
  Filled 2018-02-11: qty 1

## 2018-02-11 MED ORDER — AZITHROMYCIN 250 MG PO TABS: 250.0000 mg | ORAL_TABLET | Freq: Every day | ORAL | 0 refills | Status: DC

## 2018-02-11 MED ORDER — AEROCHAMBER PLUS FLO-VU MEDIUM MISC
1.0000 | Freq: Once | Status: AC
  Administered 2018-02-11: 1
  Filled 2018-02-11: qty 1

## 2018-02-11 NOTE — ED Provider Notes (Signed)
[facility] EMERGENCY DEPARTMENT Provider Note   CSN: [id number] Arrival date & time: 02/11/18  2022     History   Chief Complaint Chief Complaint  Patient presents with  . URI    HPI James Downs is a 52 y.o. male.  Pt presents to the ED today with continued URI sx.  He has had a cough and sinus congestion and eye watering for several days.  He was seen here on 12/16 for the same.  He has continued to cough.  He has been unable to go to work.  He was given a rx for tessalon perles and robitussin which has not helped.  He has continued to have intermittent fevers.     Past Medical History:  Diagnosis Date  . Arthritis   . Depression   . Gout   . Hypercholesterolemia   . Hypertension    on meds  . Kidney stone   . Sleep apnea    uses CPAP    Patient Active Problem List   Diagnosis Date Noted  . 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 Weakness of left leg 11/04/2013  . Paresthesias 11/04/2013  . Lumbar radiculopathy 11/04/2013  . Degenerative disc disease, lumbar 11/04/2013  . ONYCHOMYCOSIS 12/23/2006  . HYPERLIPIDEMIA 12/23/2006  . ANXIETY DISORDER, GENERALIZED 12/23/2006  . TOBACCO ABUSE 12/23/2006  . SYNDROME, CARPAL TUNNEL 12/23/2006  . HYPERTENSION 12/23/2006  . NEPHROLITHIASIS, HX OF 12/23/2006    Past Surgical History:  Procedure Laterality Date  . FOOT SURGERY Right 1991  . HAND SURGERY Right 1985  . POSTERIOR LUMBAR FUSION 4 LEVEL N/A 11/29/2013   Procedure: Thoracic Eight, Thoracic Nine, Costotransversectomy   Thoracic Seven-Eight Thoracic Eight-Nine diskectomy;  Surgeon: Lisbeth Renshaw, MD;  Location: [facility];  Service: Neurosurgery;  Laterality: N/A;  Thoracic Eight, Thoracic Nine, Costotransversectomy   Thoracic Seven-Eight Thoracic Eight-Nine diskectomy        Home Medications    Prior to Admission medications   Medication Sig Start Date End Date Taking? Authorizing Provider    azithromycin (ZITHROMAX) 250 MG tablet Take 1 tablet (250 mg total) by mouth daily. Take first 2 tablets together, then 1 every day until finished. 02/11/18   Jacalyn Lefevre, MD  benzonatate (TESSALON) 100 MG capsule Take 1 capsule (100 mg total) by mouth every 8 (eight) hours. 02/08/18   Caccavale, Sophia, PA-C  clindamycin (CLEOCIN) 300 MG capsule Take 1 capsule (300 mg total) by mouth 4 (four) times daily. X 7 days 09/04/17   Loren Racer, MD  Fexofenadine HCl [facility] ALLERGY PO) Take by mouth.    [provider]  fluticasone (FLONASE) 50 MCG/ACT nasal spray Place 1 spray into both nostrils daily. 08/10/17   Maxwell Caul, PA-C  guaiFENesin (ROBITUSSIN) 100 MG/5ML liquid Take 5 mLs (100 mg total) by mouth every 6 (six) hours as needed for cough. 02/08/18   Caccavale, Sophia, PA-C  HYDRALAZINE-HCTZ PO Take by mouth.    [provider]  hydrochlorothiazide (HYDRODIURIL) 50 MG tablet Take 1 tablet (50 mg total) by mouth daily. 04/26/17   Dartha Lodge, PA-C  HYDROcodone-acetaminophen (NORCO/VICODIN) 5-325 MG tablet Take 1 tablet by mouth every 4 (four) hours as needed. 02/11/18   Jacalyn Lefevre, MD  methocarbamol (ROBAXIN) 500 MG tablet Take 1 tablet (500 mg total) by mouth 2 (two) times daily. 04/26/17   Dartha Lodge, PA-C  PARoxetine HCl (PAXIL PO) Take by mouth.    [provider]  predniSONE Albesa Seen  UNI-PAK 21 TAB) 10 MG (21) TBPK tablet Take 6 tabs for 2 days, then 5 for 2 days, then 4 for 2 days, then 3 for 2 days, 2 for 2 days, then 1 for 2 days 02/11/18   Jacalyn LefevreHaviland, Julie, MD  telmisartan (MICARDIS) 20 MG tablet Take 1 tablet (20 mg total) by mouth daily. 04/26/17   Dartha LodgeFord, Kelsey N, PA-C    Family History Family History  Problem Relation Age of Onset  . Cancer Father        BRAIN/PROSTATE/SKIN  . Hypertension Father   . Cancer Paternal Grandfather   . Heart failure Maternal Grandmother   . Diabetes Maternal Grandmother   . Hypertension Maternal  Grandmother   . Hypertension Maternal Grandfather     Social History Social History   Tobacco Use  . Smoking status: Current Every Day Smoker    Packs/day: 1.00    Years: 30.00    Pack years: 30.00    Types: Cigarettes  . Smokeless tobacco: Former Engineer, waterUser  Substance Use Topics  . Alcohol use: No  . Drug use: No     Allergies   Patient has no known allergies.   Review of Systems Review of Systems  Constitutional: Positive for chills and fever.  HENT: Positive for congestion.   Eyes:       Eyes tearing  Respiratory: Positive for cough.   All other systems reviewed and are negative.    Physical Exam Updated Vital Signs BP (!) 152/111 (BP Location: Right Arm)   Pulse (!) 108   Temp 98.1 F (36.7 C) (Oral)   Resp 18   SpO2 98%   Physical Exam Vitals signs and nursing note reviewed.  Constitutional:      Appearance: Normal appearance.  HENT:     Head: Normocephalic and atraumatic.     Right Ear: Tympanic membrane, ear canal and external ear normal.     Left Ear: Tympanic membrane, ear canal and external ear normal.     Nose: Congestion present.     Mouth/Throat:     Mouth: Mucous membranes are moist.     Pharynx: Oropharynx is clear.  Eyes:     Extraocular Movements: Extraocular movements intact.     Conjunctiva/sclera:     Right eye: Right conjunctiva is injected.     Left eye: Left conjunctiva is injected.     Pupils: Pupils are equal, round, and reactive to light.  Neck:     Musculoskeletal: Normal range of motion.  Cardiovascular:     Rate and Rhythm: Normal rate and regular rhythm.  Pulmonary:     Effort: Pulmonary effort is normal.     Breath sounds: Normal breath sounds.  Abdominal:     General: Abdomen is flat.  Musculoskeletal: Normal range of motion.  Skin:    General: Skin is warm.     Capillary Refill: Capillary refill takes less than 2 seconds.  Neurological:     General: No focal deficit present.     Mental Status: He is alert and  oriented to person, place, and time.  Psychiatric:        Mood and Affect: Mood normal.        Behavior: Behavior normal.        Thought Content: Thought content normal.        Judgment: Judgment normal.      ED Treatments / Results  Labs (all labs ordered are listed, but only abnormal results are displayed) Labs Reviewed - No  data to display  EKG None  Radiology Dg Chest 2 View  Result Date: 02/11/2018 CLINICAL DATA:  Cough and flu like symptoms for the past 5 days. EXAM: CHEST - 2 VIEW COMPARISON:  Chest x-ray dated August 10, 2017. FINDINGS: The heart size and mediastinal contours are within normal limits. Both lungs are clear. The visualized skeletal structures are unremarkable. IMPRESSION: No active cardiopulmonary disease. Electronically Signed   By: Obie DredgeWilliam T Derry M.D.   On: 02/11/2018 22:04    Procedures Procedures (including critical care time)  Medications Ordered in ED Medications  albuterol (PROVENTIL HFA;VENTOLIN HFA) 108 (90 Base) MCG/ACT inhaler 1-2 puff (2 puffs Inhalation Given 02/11/18 2229)  dexamethasone (DECADRON) injection 10 mg (10 mg Intramuscular Given 02/11/18 2047)  ketorolac (TORADOL) 30 MG/ML injection 30 mg (30 mg Intramuscular Given 02/11/18 2047)  azithromycin (ZITHROMAX) tablet 500 mg (500 mg Oral Given 02/11/18 2227)  AEROCHAMBER PLUS FLO-VU MEDIUM MISC 1 each (1 each Other Given 02/11/18 2229)     Initial Impression / Assessment and Plan / ED Course  I have reviewed the triage vital signs and the nursing notes.  Pertinent labs & imaging results that were available during my care of the patient were reviewed by me and considered in my medical decision making (see chart for details).    Pt is feeling better after the above tx.  He is encouraged to stop smoking.  He does not have PNA.  He will be started on abx as sx have been going on for several days.  Pt will be d/c home with an inhaler and spacer.  He will be d/c home with steroids.  He  knows to return if worse.  Final Clinical Impressions(s) / ED Diagnoses   Final diagnoses:  Acute bronchitis, unspecified organism  Mild intermittent reactive airway disease with acute exacerbation  Tobacco abuse    ED Discharge Orders         Ordered    predniSONE (STERAPRED UNI-PAK 21 TAB) 10 MG (21) TBPK tablet     02/11/18 2228    azithromycin (ZITHROMAX) 250 MG tablet  Daily     02/11/18 2228    HYDROcodone-acetaminophen (NORCO/VICODIN) 5-325 MG tablet  Every 4 hours PRN     02/11/18 2228           Jacalyn LefevreHaviland, Julie, MD 02/11/18 2230

## 2018-02-11 NOTE — Discharge Instructions (Addendum)
Try to stop smoking. °

## 2018-02-11 NOTE — ED Triage Notes (Signed)
Pt c/o URI symptoms x 1 week seen 4 days ago here for same , need return note for work

## 2018-05-15 ENCOUNTER — Encounter (HOSPITAL_BASED_OUTPATIENT_CLINIC_OR_DEPARTMENT_OTHER): Payer: Self-pay | Admitting: Emergency Medicine

## 2018-05-15 ENCOUNTER — Emergency Department (HOSPITAL_BASED_OUTPATIENT_CLINIC_OR_DEPARTMENT_OTHER)
Admission: EM | Admit: 2018-05-15 | Discharge: 2018-05-15 | Disposition: A | Discharge status: Home / Self Care | Payer: BLUE CROSS/BLUE SHIELD | Attending: Emergency Medicine | Admitting: Emergency Medicine

## 2018-05-15 ENCOUNTER — Emergency Department (HOSPITAL_BASED_OUTPATIENT_CLINIC_OR_DEPARTMENT_OTHER): Payer: BLUE CROSS/BLUE SHIELD

## 2018-05-15 ENCOUNTER — Other Ambulatory Visit: Payer: Self-pay

## 2018-05-15 DIAGNOSIS — Z79899 Other long term (current) drug therapy: Secondary | ICD-10-CM | POA: Diagnosis not present

## 2018-05-15 DIAGNOSIS — R05 Cough: Secondary | ICD-10-CM | POA: Insufficient documentation

## 2018-05-15 DIAGNOSIS — M791 Myalgia, unspecified site: Secondary | ICD-10-CM | POA: Diagnosis not present

## 2018-05-15 DIAGNOSIS — I10 Essential (primary) hypertension: Secondary | ICD-10-CM | POA: Diagnosis not present

## 2018-05-15 DIAGNOSIS — R0981 Nasal congestion: Secondary | ICD-10-CM | POA: Insufficient documentation

## 2018-05-15 DIAGNOSIS — R5383 Other fatigue: Secondary | ICD-10-CM | POA: Diagnosis not present

## 2018-05-15 DIAGNOSIS — F17210 Nicotine dependence, cigarettes, uncomplicated: Secondary | ICD-10-CM | POA: Insufficient documentation

## 2018-05-15 DIAGNOSIS — R69 Illness, unspecified: Secondary | ICD-10-CM

## 2018-05-15 DIAGNOSIS — R51 Headache: Secondary | ICD-10-CM | POA: Diagnosis not present

## 2018-05-15 DIAGNOSIS — J111 Influenza due to unidentified influenza virus with other respiratory manifestations: Secondary | ICD-10-CM | POA: Insufficient documentation

## 2018-05-15 LAB — INFLUENZA PANEL BY PCR (TYPE A & B)
Influenza A By PCR: NEGATIVE
Influenza B By PCR: NEGATIVE

## 2018-05-15 MED ORDER — ONDANSETRON 4 MG PO TBDP: 4.0000 mg | ORAL_TABLET | Freq: Three times a day (TID) | ORAL | 0 refills | Status: DC | PRN

## 2018-05-15 MED ORDER — ACETAMINOPHEN 500 MG PO TABS
1000.0000 mg | ORAL_TABLET | Freq: Once | ORAL | Status: AC
  Administered 2018-05-15: 1000 mg via ORAL
  Filled 2018-05-15: qty 2

## 2018-05-15 MED ORDER — OSELTAMIVIR PHOSPHATE 75 MG PO CAPS: 75.0000 mg | ORAL_CAPSULE | Freq: Two times a day (BID) | ORAL | 0 refills | Status: DC

## 2018-05-15 NOTE — Discharge Instructions (Signed)
You can take Tylenol or Ibuprofen as directed for pain. You can alternate Tylenol and Ibuprofen every 4 hours. If you take Tylenol at 1pm, then you can take Ibuprofen at 5pm. Then you can take Tylenol again at 9pm.   Make sure you are staying hydrated and getting plenty of rest.  As we discussed, your flu test is pending.  You can check back tomorrow online to see if the results are positive.  If they are positive, you can start Tamiflu.  It may cause some nausea/vomiting.  I provided some Zofran.  The flu is negative, do not take the Tamiflu.   As we discussed, you should use quarantine for 2 weeks.  Return the emergency department for any difficulty breathing, high fever despite medications, chest pain or any other worsening or concerning symptoms.

## 2018-05-15 NOTE — ED Notes (Signed)
Pt states he did not check his temp at home but his head felt hot, so he took motrin

## 2018-05-15 NOTE — ED Notes (Signed)
Patient verbalizes understanding of discharge instructions. Opportunity for questioning and answers were provided. Armband removed by staff, pt discharged from ED ambulatory.   

## 2018-05-15 NOTE — ED Provider Notes (Signed)
[facility] EMERGENCY DEPARTMENT Provider Note   CSN: [id number] Arrival date & time: 05/15/18  1828    History   Chief Complaint Chief Complaint  Patient presents with  . flu like sx    HPI James Downs is a 53 y.o. male with PMH/o Depression, Gout, HTN, who presents for evaluation of headache, subjective fever, cough, nasal congestion, sneezing, generalized body aches began this morning.  He states granddaughter has been at home sick with similar symptoms.  He states that wife is also gone to bed.  He states that night, he started feeling some fatigue and this morning, his symptoms started.  He states he did not measure fever at home but states that his wife felt him and said he felt hot.  Patient states he has been coughing but states cough is not productive.  He states he feels "tired, sore and fatigued.".  He did not get a flu shot this year.  He is a current smoker.  He denies any recent travel.  Patient denies any known exposure to COVID-19.  Patient states that his granddaughter has been sick with similar symptoms was seen by P's pediatrician.  He was diagnosed with viral URI and ear infection.  Patient denies any abdominal pain, nausea/vomiting, chest pain, difficulty breathing.     The history is provided by the patient.    Past Medical History:  Diagnosis Date  . Arthritis   . Depression   . Gout   . Hypercholesterolemia   . Hypertension    on meds  . Kidney stone   . Sleep apnea    uses CPAP    Patient Active Problem List   Diagnosis Date Noted  . 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 Weakness of left leg 11/04/2013  . Paresthesias 11/04/2013  . Lumbar radiculopathy 11/04/2013  . Degenerative disc disease, lumbar 11/04/2013  . ONYCHOMYCOSIS 12/23/2006  . HYPERLIPIDEMIA 12/23/2006  . ANXIETY DISORDER, GENERALIZED 12/23/2006  . TOBACCO ABUSE 12/23/2006  . SYNDROME, CARPAL TUNNEL 12/23/2006  . HYPERTENSION  12/23/2006  . NEPHROLITHIASIS, HX OF 12/23/2006    Past Surgical History:  Procedure Laterality Date  . FOOT SURGERY Right 1991  . HAND SURGERY Right 1985  . POSTERIOR LUMBAR FUSION 4 LEVEL N/A 11/29/2013   Procedure: Thoracic Eight, Thoracic Nine, Costotransversectomy   Thoracic Seven-Eight Thoracic Eight-Nine diskectomy;  Surgeon: Lisbeth Renshaw, MD;  Location: [facility];  Service: Neurosurgery;  Laterality: N/A;  Thoracic Eight, Thoracic Nine, Costotransversectomy   Thoracic Seven-Eight Thoracic Eight-Nine diskectomy        Home Medications    Prior to Admission medications   Medication Sig Start Date End Date Taking? Authorizing Provider  Fexofenadine HCl (ALLEGRA ALLERGY PO) Take by mouth.   Yes [provider]  HYDRALAZINE-HCTZ PO Take by mouth.   Yes [provider]  methocarbamol (ROBAXIN) 500 MG tablet Take 1 tablet (500 mg total) by mouth 2 (two) times daily. 04/26/17  Yes Dartha Lodge, PA-C  PARoxetine HCl (PAXIL PO) Take by mouth.   Yes [provider]  azithromycin (ZITHROMAX) 250 MG tablet Take 1 tablet (250 mg total) by mouth daily. Take first 2 tablets together, then 1 every day until finished. 02/11/18   Jacalyn Lefevre, MD  benzonatate (TESSALON) 100 MG capsule Take 1 capsule (100 mg total) by mouth every 8 (eight) hours. 02/08/18   Caccavale, Sophia, PA-C  clindamycin (CLEOCIN) 300 MG capsule Take 1 capsule (300 mg total) by mouth 4 (  four) times daily. X 7 days 09/04/17   Loren Racer, MD  fluticasone [facility]) 50 MCG/ACT nasal spray Place 1 spray into both nostrils daily. 08/10/17   Maxwell Caul, PA-C  guaiFENesin (ROBITUSSIN) 100 MG/5ML liquid Take 5 mLs (100 mg total) by mouth every 6 (six) hours as needed for cough. 02/08/18   Caccavale, Sophia, PA-C  hydrochlorothiazide (HYDRODIURIL) 50 MG tablet Take 1 tablet (50 mg total) by mouth daily. 04/26/17   Dartha Lodge, PA-C  HYDROcodone-acetaminophen (NORCO/VICODIN) 5-325 MG  tablet Take 1 tablet by mouth every 4 (four) hours as needed. 02/11/18   Jacalyn Lefevre, MD  ondansetron (ZOFRAN ODT) 4 MG disintegrating tablet Take 1 tablet (4 mg total) by mouth every 8 (eight) hours as needed for nausea or vomiting. 05/15/18   Maxwell Caul, PA-C  oseltamivir (TAMIFLU) 75 MG capsule Take 1 capsule (75 mg total) by mouth every 12 (twelve) hours. 05/15/18   Maxwell Caul, PA-C  predniSONE (STERAPRED UNI-PAK 21 TAB) 10 MG (21) TBPK tablet Take 6 tabs for 2 days, then 5 for 2 days, then 4 for 2 days, then 3 for 2 days, 2 for 2 days, then 1 for 2 days 02/11/18   Jacalyn Lefevre, MD  telmisartan (MICARDIS) 20 MG tablet Take 1 tablet (20 mg total) by mouth daily. 04/26/17   Dartha Lodge, PA-C    Family History Family History  Problem Relation Age of Onset  . Cancer Father        BRAIN/PROSTATE/SKIN  . Hypertension Father   . Cancer Paternal Grandfather   . Heart failure Maternal Grandmother   . Diabetes Maternal Grandmother   . Hypertension Maternal Grandmother   . Hypertension Maternal Grandfather     Social History Social History   Tobacco Use  . Smoking status: Current Every Day Smoker    Packs/day: 1.00    Years: 30.00    Pack years: 30.00    Types: Cigarettes  . Smokeless tobacco: Former Engineer, water Use Topics  . Alcohol use: No  . Drug use: No     Allergies   Patient has no known allergies.   Review of Systems Review of Systems  Constitutional: Positive for fatigue and fever (Subjective).  HENT: Positive for congestion and sneezing.   Respiratory: Positive for cough. Negative for shortness of breath.   Cardiovascular: Negative for chest pain.  Gastrointestinal: Negative for abdominal pain, nausea and vomiting.  Genitourinary: Negative for dysuria and hematuria.  Musculoskeletal: Positive for myalgias.  Neurological: Negative for headaches.  All other systems reviewed and are negative.    Physical Exam Updated Vital Signs BP (!)  162/100   Pulse 85   Temp 97.8 F (36.6 C) (Oral)   Resp 16   SpO2 98%   Physical Exam Vitals signs and nursing note reviewed.  Constitutional:      Appearance: He is well-developed.  HENT:     Head: Normocephalic and atraumatic.     Nose: Congestion present.     Mouth/Throat:     Pharynx: Oropharynx is clear.  Eyes:     General: No scleral icterus.       Right eye: No discharge.        Left eye: No discharge.     Conjunctiva/sclera: Conjunctivae normal.  Pulmonary:     Effort: Pulmonary effort is normal.     Comments: Lungs clear to auscultation bilaterally.  Symmetric chest rise.  No wheezing, rales, rhonchi. Skin:    General: Skin is  warm and dry.  Neurological:     Mental Status: He is alert.  Psychiatric:        Speech: Speech normal.        Behavior: Behavior normal.      ED Treatments / Results  Labs (all labs ordered are listed, but only abnormal results are displayed) Labs Reviewed  INFLUENZA PANEL BY PCR (TYPE A & B)    EKG None  Radiology Dg Chest 2 View  Result Date: 05/15/2018 CLINICAL DATA:  Cough, congestion, body aches and fatigue today. Smoker. EXAM: CHEST - 2 VIEW COMPARISON:  Chest x-ray dated 02/11/2018. FINDINGS: The heart size and mediastinal contours are within normal limits. Both lungs are clear. The visualized skeletal structures are unremarkable. IMPRESSION: No active cardiopulmonary disease. No evidence of pneumonia or pulmonary edema. Electronically Signed   By: Bary Ethon M.D.   On: 05/15/2018 19:24    Procedures Procedures (including critical care time)  Medications Ordered in ED Medications  acetaminophen (TYLENOL) tablet 1,000 mg (1,000 mg Oral Given 05/15/18 1944)     Initial Impression / Assessment and Plan / ED Course  I have reviewed the triage vital signs and the nursing notes.  Pertinent labs & imaging results that were available during my care of the patient were reviewed by me and considered in my medical  decision making (see chart for details).        53 year old male who presents for evaluation of generalized body aches, subjective fever/chills, fatigue, congestion, sneezing that began today.  Recent sick contacts with granddaughter, wife.  Daughter seen by a pediatrician he was having URI and ear infection.  No recent travel.  Patient reports that fever was based on a feeling him.  No fever at home. Patient is afebrile, non-toxic appearing, sitting comfortably on examination table.  Signs reviewed.  He is hypertensive but did not take his high blood pressure medication today.  No chest pain, difficulty breathing, numbness/weakness.  Lungs clear to auscultation.  Suspect URI versus influenza.  Will plan for chest x-ray, flu swab.  Patient does not meet any criteria for COVID 19 testing.  Chest x-ray negative for any acute signs of pneumonia.  Flu is pending.  Vital signs are stable.  Unfortunate this facility, her flu does not come back for several hours.  Patient is stable at this time.  We will plan to discharge home flu pending.  Discussed plan with patient.  He is agreeable.  He does wish to have Tamiflu.  Instructed him that if the test is negative, did not start Tamiflu.  Encourage home supportive care measures, limiting contact with other people given symptoms. At this time, patient exhibits no emergent life-threatening condition that require further evaluation in ED or admission. Patient had ample opportunity for questions and discussion. All patient's questions were answered with full understanding. Strict return precautions discussed. Patient expresses understanding and agreement to plan.   Portions of this note were generated with Scientist, clinical (histocompatibility and immunogenetics). Dictation errors may occur despite best attempts at proofreading.   Final Clinical Impressions(s) / ED Diagnoses   Final diagnoses:  Influenza-like illness    ED Discharge Orders         Ordered    ondansetron (ZOFRAN ODT) 4 MG  disintegrating tablet  Every 8 hours PRN     05/15/18 2045    oseltamivir (TAMIFLU) 75 MG capsule  Every 12 hours     05/15/18 2045           Layden,  Sherrian Divers 05/15/18 2345    Tilden Fossa, MD 05/16/18 1504

## 2018-05-15 NOTE — ED Notes (Signed)
ED Provider at bedside. 

## 2018-05-15 NOTE — ED Triage Notes (Signed)
Pt states he has been taking care of his sick granddaughter, she has been having cold sx, per her PCP she had a URI with bad ear infection and low suspect for COVID19. Pt states he woke up today with dry cough, feeling of fever, motrin taken at home. PT also states fatigue all occurring today.

## 2018-06-13 ENCOUNTER — Emergency Department (HOSPITAL_BASED_OUTPATIENT_CLINIC_OR_DEPARTMENT_OTHER): Payer: BLUE CROSS/BLUE SHIELD

## 2018-06-13 ENCOUNTER — Encounter (HOSPITAL_BASED_OUTPATIENT_CLINIC_OR_DEPARTMENT_OTHER): Payer: Self-pay

## 2018-06-13 ENCOUNTER — Other Ambulatory Visit: Payer: Self-pay

## 2018-06-13 ENCOUNTER — Emergency Department (HOSPITAL_BASED_OUTPATIENT_CLINIC_OR_DEPARTMENT_OTHER)
Admission: EM | Admit: 2018-06-13 | Discharge: 2018-06-13 | Disposition: A | Discharge status: Home / Self Care | Payer: BLUE CROSS/BLUE SHIELD | Attending: Emergency Medicine | Admitting: Emergency Medicine

## 2018-06-13 DIAGNOSIS — L0231 Cutaneous abscess of buttock: Secondary | ICD-10-CM | POA: Insufficient documentation

## 2018-06-13 DIAGNOSIS — Z79899 Other long term (current) drug therapy: Secondary | ICD-10-CM | POA: Diagnosis not present

## 2018-06-13 DIAGNOSIS — F17210 Nicotine dependence, cigarettes, uncomplicated: Secondary | ICD-10-CM | POA: Insufficient documentation

## 2018-06-13 DIAGNOSIS — I10 Essential (primary) hypertension: Secondary | ICD-10-CM | POA: Diagnosis not present

## 2018-06-13 DIAGNOSIS — N179 Acute kidney failure, unspecified: Secondary | ICD-10-CM

## 2018-06-13 DIAGNOSIS — K644 Residual hemorrhoidal skin tags: Secondary | ICD-10-CM

## 2018-06-13 DIAGNOSIS — L03317 Cellulitis of buttock: Secondary | ICD-10-CM | POA: Diagnosis not present

## 2018-06-13 DIAGNOSIS — K7460 Unspecified cirrhosis of liver: Secondary | ICD-10-CM | POA: Diagnosis not present

## 2018-06-13 DIAGNOSIS — K648 Other hemorrhoids: Secondary | ICD-10-CM | POA: Diagnosis not present

## 2018-06-13 DIAGNOSIS — K6289 Other specified diseases of anus and rectum: Secondary | ICD-10-CM | POA: Diagnosis not present

## 2018-06-13 LAB — URINALYSIS, ROUTINE W REFLEX MICROSCOPIC
Bilirubin Urine: NEGATIVE
Glucose, UA: NEGATIVE mg/dL
Hgb urine dipstick: NEGATIVE
Ketones, ur: NEGATIVE mg/dL
Leukocytes,Ua: NEGATIVE
Nitrite: NEGATIVE
Protein, ur: NEGATIVE mg/dL
Specific Gravity, Urine: 1.02 (ref 1.005–1.030)
pH: 5.5 (ref 5.0–8.0)

## 2018-06-13 LAB — COMPREHENSIVE METABOLIC PANEL
ALT: 49 U/L — ABNORMAL HIGH (ref 0–44)
AST: 41 U/L (ref 15–41)
Albumin: 3.7 g/dL (ref 3.5–5.0)
Alkaline Phosphatase: 71 U/L (ref 38–126)
Anion gap: 7 (ref 5–15)
BUN: 27 mg/dL — ABNORMAL HIGH (ref 6–20)
CO2: 23 mmol/L (ref 22–32)
Calcium: 9.3 mg/dL (ref 8.9–10.3)
Chloride: 107 mmol/L (ref 98–111)
Creatinine, Ser: 2.08 mg/dL — ABNORMAL HIGH (ref 0.61–1.24)
GFR calc Af Amer: 41 mL/min — ABNORMAL LOW (ref >60)
GFR calc non Af Amer: 35 mL/min — ABNORMAL LOW (ref >60)
Glucose, Bld: 79 mg/dL (ref 70–99)
Potassium: 4.3 mmol/L (ref 3.5–5.1)
Sodium: 137 mmol/L (ref 135–145)
Total Bilirubin: 0.7 mg/dL (ref 0.3–1.2)
Total Protein: 7.6 g/dL (ref 6.5–8.1)

## 2018-06-13 LAB — CBC WITH DIFFERENTIAL/PLATELET
Abs Immature Granulocytes: 0.03 10*3/uL (ref 0.00–0.07)
Basophils Absolute: 0.1 10*3/uL (ref 0.0–0.1)
Basophils Relative: 1 %
Eosinophils Absolute: 0.3 10*3/uL (ref 0.0–0.5)
Eosinophils Relative: 3 %
HCT: 49.7 % (ref 39.0–52.0)
Hemoglobin: 16.1 g/dL (ref 13.0–17.0)
Immature Granulocytes: 0 %
Lymphocytes Relative: 25 %
Lymphs Abs: 2.6 10*3/uL (ref 0.7–4.0)
MCH: 29.5 pg (ref 26.0–34.0)
MCHC: 32.4 g/dL (ref 30.0–36.0)
MCV: 91 fL (ref 80.0–100.0)
Monocytes Absolute: 1.3 10*3/uL — ABNORMAL HIGH (ref 0.1–1.0)
Monocytes Relative: 13 %
Neutro Abs: 6 10*3/uL (ref 1.7–7.7)
Neutrophils Relative %: 58 %
Platelets: 210 10*3/uL (ref 150–400)
RBC: 5.46 MIL/uL (ref 4.22–5.81)
RDW: 13.1 % (ref 11.5–15.5)
WBC: 10.3 10*3/uL (ref 4.0–10.5)
nRBC: 0 % (ref 0.0–0.2)

## 2018-06-13 MED ORDER — MORPHINE SULFATE (PF) 4 MG/ML IV SOLN
4.0000 mg | Freq: Once | INTRAVENOUS | Status: AC
  Administered 2018-06-13: 4 mg via INTRAVENOUS
  Filled 2018-06-13: qty 1

## 2018-06-13 MED ORDER — CLINDAMYCIN HCL 300 MG PO CAPS: 300.0000 mg | ORAL_CAPSULE | Freq: Three times a day (TID) | ORAL | 0 refills | Status: DC

## 2018-06-13 MED ORDER — SODIUM CHLORIDE 0.9 % IV BOLUS
1000.0000 mL | Freq: Once | INTRAVENOUS | Status: AC
  Administered 2018-06-13: 1000 mL via INTRAVENOUS

## 2018-06-13 MED ORDER — HYDROCODONE-ACETAMINOPHEN 5-325 MG PO TABS: 1.0000 | ORAL_TABLET | ORAL | 0 refills | Status: DC | PRN

## 2018-06-13 MED ORDER — CLINDAMYCIN PHOSPHATE 600 MG/50ML IV SOLN
600.0000 mg | Freq: Once | INTRAVENOUS | Status: AC
  Administered 2018-06-13: 600 mg via INTRAVENOUS
  Filled 2018-06-13: qty 50

## 2018-06-13 MED ORDER — LIDOCAINE HCL 2 % IJ SOLN
10.0000 mL | Freq: Once | INTRAMUSCULAR | Status: AC
  Administered 2018-06-13: 200 mg
  Filled 2018-06-13: qty 40

## 2018-06-13 NOTE — Discharge Instructions (Signed)
Take clindamycin as prescribed.   Your kidney functions is slightly abnormal.   You need to return in 2 days for wound check and recheck your kidney function   Return earlier if you have severe pain, vomiting, fever, purulent discharge

## 2018-06-13 NOTE — ED Provider Notes (Signed)
[facility] EMERGENCY DEPARTMENT Provider Note   CSN: [id number] Arrival date & time: 06/13/18  1717    History   Chief Complaint Chief Complaint  Patient presents with   Abscess    HPI James Downs is a 53 y.o. male history of hypertension, high cholesterol, recurrent abscess here presenting with right buttock pain.  Patient states that about a year ago, he had I&D done and was diagnosed with right buttock abscess.  He states that intermittently he keeps on draining some clear discharge.  Over the last several days the discharge became more and turns yellow in color.  He also has increased pain in the rectal area.  He states that he has some hemorrhoids but they are not bleeding.     The history is provided by the patient.    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   Weakness of left leg 11/04/2013   Paresthesias 11/04/2013   Lumbar radiculopathy 11/04/2013   Degenerative disc disease, lumbar 11/04/2013   ONYCHOMYCOSIS 12/23/2006   HYPERLIPIDEMIA 12/23/2006   ANXIETY DISORDER, GENERALIZED 12/23/2006   TOBACCO ABUSE 12/23/2006   SYNDROME, CARPAL TUNNEL 12/23/2006   HYPERTENSION 12/23/2006   NEPHROLITHIASIS, HX OF 12/23/2006    Past Surgical History:  Procedure Laterality Date   FOOT SURGERY Right 1991   HAND SURGERY Right 1985   POSTERIOR LUMBAR FUSION 4 LEVEL N/A 11/29/2013   Procedure: Thoracic Eight, Thoracic Nine, Costotransversectomy   Thoracic Seven-Eight Thoracic Eight-Nine diskectomy;  Surgeon: Lisbeth Renshaw, MD;  Location: [facility];  Service: Neurosurgery;  Laterality: N/A;  Thoracic Eight, Thoracic Nine, Costotransversectomy   Thoracic Seven-Eight  Thoracic Eight-Nine diskectomy        Home Medications    Prior to Admission medications   Medication Sig Start Date End Date Taking? Authorizing Provider  azithromycin (ZITHROMAX) 250 MG tablet Take 1 tablet (250 mg total) by mouth daily. Take first 2 tablets together, then 1 every day until finished. 02/11/18   Jacalyn Lefevre, MD  benzonatate (TESSALON) 100 MG capsule Take 1 capsule (100 mg total) by mouth every 8 (eight) hours. 02/08/18   Caccavale, Sophia, PA-C  clindamycin (CLEOCIN) 300 MG capsule Take 1 capsule (300 mg total) by mouth 4 (four) times daily. X 7 days 09/04/17   Loren Racer, MD  Fexofenadine HCl [facility] ALLERGY PO) Take by mouth.    [provider]  fluticasone (FLONASE) 50 MCG/ACT nasal spray Place 1 spray into both nostrils daily. 08/10/17   Maxwell Caul, PA-C  guaiFENesin (ROBITUSSIN) 100 MG/5ML liquid Take 5 mLs (100 mg total) by mouth every 6 (six) hours as needed for cough. 02/08/18   Caccavale, Sophia, PA-C  HYDRALAZINE-HCTZ PO Take by mouth.    [provider]  hydrochlorothiazide (HYDRODIURIL) 50 MG tablet Take 1 tablet (50 mg total) by mouth daily. 04/26/17   Dartha Lodge, PA-C  HYDROcodone-acetaminophen (NORCO/VICODIN) 5-325 MG tablet Take 1 tablet by mouth every 4 (four) hours as needed. 02/11/18   Jacalyn Lefevre, MD  methocarbamol (ROBAXIN) 500 MG tablet Take 1 tablet (500 mg total) by mouth 2 (two) times daily. 04/26/17   Dartha Lodge, PA-C  ondansetron (ZOFRAN ODT) 4 MG disintegrating tablet Take 1 tablet (4 mg total) by mouth every 8 (eight) hours as needed for nausea or vomiting. 05/15/18   Maxwell Caul, PA-C  oseltamivir (TAMIFLU) 75 MG capsule Take 1 capsule (75 mg total) by mouth every 12 (twelve) hours. 05/15/18   Graciella Freer A, PA-C  PARoxetine HCl (PAXIL PO) Take by mouth.    [provider]  predniSONE (STERAPRED UNI-PAK 21 TAB) 10 MG (21) TBPK tablet Take 6 tabs for 2 days, then 5 for 2 days, then 4 for 2  days, then 3 for 2 days, 2 for 2 days, then 1 for 2 days 02/11/18   Jacalyn Lefevre, MD  telmisartan (MICARDIS) 20 MG tablet Take 1 tablet (20 mg total) by mouth daily. 04/26/17   Dartha Lodge, PA-C    Family History Family History  Problem Relation Age of Onset   Cancer Father        BRAIN/PROSTATE/SKIN   Hypertension Father    Cancer Paternal Grandfather    Heart failure Maternal Grandmother    Diabetes Maternal Grandmother    Hypertension Maternal Grandmother    Hypertension Maternal Grandfather     Social History Social History   Tobacco Use   Smoking status: Current Every Day Smoker    Packs/day: 1.00    Years: 30.00    Pack years: 30.00    Types: Cigarettes   Smokeless tobacco: Former Neurosurgeon  Substance Use Topics   Alcohol use: No   Drug use: No     Allergies   Patient has no known allergies.   Review of Systems Review of Systems  Gastrointestinal: Positive for rectal pain.  Skin: Positive for wound.  All other systems reviewed and are negative.    Physical Exam Updated Vital Signs BP 127/80 (BP Location: Right Arm)    Pulse 98    Temp 97.9 F (36.6 C) (Oral)    Resp 17    Ht  (1.753 m)    Wt 113.4 kg    SpO2 99%    BMI 36.92 kg/m   Physical Exam Vitals signs and nursing note reviewed.  HENT:     Head: Normocephalic.     Nose: Nose normal.     Mouth/Throat:     Mouth: Mucous membranes are moist.  Eyes:     Extraocular Movements: Extraocular movements intact.     Pupils: Pupils are equal, round, and reactive to light.  Neck:     Musculoskeletal: Normal range of motion.  Cardiovascular:     Rate and Rhythm: Normal rate.  Pulmonary:     Effort: Pulmonary effort is normal.  Abdominal:     General: Abdomen is flat.     Palpations: Abdomen is soft.  Genitourinary:    Comments: Rectal- multiple external hemorrhoids, no active bleeding. There is keloid R buttock where the previous I and D was performed, some fluctuance. Some  surrounding cellulitis as well  Skin:    General: Skin is warm.     Capillary Refill: Capillary refill takes less than 2 seconds.     Findings: Erythema present.  Neurological:     General: No focal deficit present.     Mental Status: He is alert and oriented to person, place, and time.  Psychiatric:        Mood and Affect: Mood normal.        Behavior: Behavior normal.      ED Treatments / Results  Labs (all labs  ordered are listed, but only abnormal results are displayed) Labs Reviewed  CBC WITH DIFFERENTIAL/PLATELET - Abnormal; Notable for the following components:      Result Value   Monocytes Absolute 1.3 (*)    All other components within normal limits  COMPREHENSIVE METABOLIC PANEL - Abnormal; Notable for the following components:   BUN 27 (*)    Creatinine, Ser 2.08 (*)    ALT 49 (*)    GFR calc non Af Amer 35 (*)    GFR calc Af Amer 41 (*)    All other components within normal limits  URINALYSIS, ROUTINE W REFLEX MICROSCOPIC    EKG None  Radiology Ct Abdomen Pelvis Wo Contrast  Result Date: 06/13/2018 CLINICAL DATA:  53 year old male with abdominal and pelvic pain. Rectal swelling. EXAM: CT ABDOMEN AND PELVIS WITHOUT CONTRAST TECHNIQUE: Multidetector CT imaging of the abdomen and pelvis was performed following the standard protocol without IV contrast. COMPARISON:  None. FINDINGS: Please note that parenchymal abnormalities may be missed without intravenous contrast. Lower chest: No acute abnormality. Hepatobiliary: Nodular hepatic contour noted and compatible with cirrhosis. The gallbladder is unremarkable. No biliary dilatation. Pancreas: Unremarkable Spleen: Unremarkable Adrenals/Urinary Tract: Multiple small bilateral renal calculi are identified, all less than 3 mm. There is no evidence of hydronephrosis or obstructing urinary calculus. The adrenal glands and bladder are unremarkable. Stomach/Bowel: A 1 cm focal area of gas with possible fluid in the LEFT perianal  region is noted and may represent a small abscess. The stomach is within normal limits. Appendix appears normal. No evidence of bowel wall thickening, distention, or inflammatory changes. Vascular/Lymphatic: Aortic atherosclerosis. No enlarged abdominal or pelvic lymph nodes. Reproductive: Prostate is unremarkable. Other: No ascites or pneumoperitoneum. A small LEFT inguinal hernia containing fat is noted. Musculoskeletal: No acute or suspicious bony abnormality. IMPRESSION: 1. 1 cm focus of gas/possible fluid in the LEFT perianal region suspicious for small abscess. Correlate clinically. 2. No other acute abnormality within the abdomen or pelvis. 3. Cirrhosis 4.  Aortic Atherosclerosis (ICD10-I70.0). Electronically Signed   By: Harmon PierJeffrey  Hu M.D.   On: 06/13/2018 19:52    Procedures Procedures (including critical care time)  EMERGENCY DEPARTMENT US SOFT TISSUE INTERPRETATION "Study: Limited Soft Tissue Ultrasound"  INDICATIONS: Pain Multiple views of the body part were obtained in real-time with a multi-frequency linear probe  PERFORMED BY: Myself IMAGES ARCHIVED?: Yes SIDE:Right  BODY PART:buttock INTERPRETATION:  possible abscess R buttock, no obvious abscess L buttock   INCISION AND DRAINAGE Performed by: Richardean Canalavid H Caelan Branden Consent: Verbal consent obtained. Risks and benefits: risks, benefits and alternatives were discussed Type: abscess  Body area: L buttock  Anesthesia: local infiltration  Incision was made with a scalpel.  Local anesthetic: lidocaine 2 % no epinephrine  Anesthetic total: 10 ml  Complexity: complex Blunt dissection to break up loculations  Drainage: serosanguinous  Drainage amount: moderate  Packing material:   Patient tolerance: Patient tolerated the procedure well with no immediate complications.      Medications Ordered in ED Medications  clindamycin (CLEOCIN) IVPB 600 mg (has no administration in time range)  morphine 4 MG/ML injection 4 mg (has  no administration in time range)  lidocaine (XYLOCAINE) 2 % (with pres) injection 200 mg (has no administration in time range)  morphine 4 MG/ML injection 4 mg (4 mg Intravenous Given 06/13/18 1835)  sodium chloride 0.9 % bolus 1,000 mL (0 mLs Intravenous Stopped 06/13/18 1940)     Initial Impression / Assessment and Plan / ED Course  I have reviewed the triage vital signs and the nursing notes.  Pertinent labs & imaging results that were available during my care of the patient were reviewed by me and considered in my medical decision making (see chart for details).       Gustavus HUTTON PELLICANE is a 53 y.o. male here with rectal pain.  He had an I&D over a year ago but has persistent pain and discharge from the wound.  I am concerned for possible fistula or underlying Crohn's disease.  Will obtain CT abdomen pelvis to assess for fistula.   8:44 PM [name] WBC is normal. Of note, Cr is 2 and baseline is 1. UA nl. CT showed possible abscess in the left perianal area.  I ultrasounded the area I do not find any focal abscess.  However, on the right buttock area where he had a previous I&D there is seem to be a keloid formation and underneath that there is a possible abscess.  Perform I&D with a scalpel and mostly blood came out.  I wonder if he develop a external hemorrhoid in that area.  He was given clindamycin in the ED.  We will bring him back in 2 days for wound check as well as repeat creatinine.      Final Clinical Impressions(s) / ED Diagnoses   Final diagnoses:  None    ED Discharge Orders    None       Charlynne Pander, MD 06/13/18 2046

## 2018-06-13 NOTE — ED Triage Notes (Signed)
Pt c/o abscess to buttock- seen here for the same w/ I/D done. Pt reports taking abx and being seen several times with no improvement. Pt reports drainage. Pt also c/o hemorrhoid pain. Pt denies fevers.

## 2018-06-13 NOTE — ED Notes (Signed)
ED Provider at bedside. 

## 2018-06-13 NOTE — ED Notes (Signed)
Patient transported to CT 

## 2018-06-13 NOTE — ED Notes (Signed)
Patient provided with urinal.

## 2018-06-15 ENCOUNTER — Other Ambulatory Visit: Payer: Self-pay

## 2018-06-15 ENCOUNTER — Encounter (HOSPITAL_BASED_OUTPATIENT_CLINIC_OR_DEPARTMENT_OTHER): Payer: Self-pay | Admitting: Emergency Medicine

## 2018-06-15 ENCOUNTER — Emergency Department (HOSPITAL_BASED_OUTPATIENT_CLINIC_OR_DEPARTMENT_OTHER)
Admission: EM | Admit: 2018-06-15 | Discharge: 2018-06-15 | Disposition: A | Discharge status: Home / Self Care | Payer: BLUE CROSS/BLUE SHIELD | Attending: Emergency Medicine | Admitting: Emergency Medicine

## 2018-06-15 DIAGNOSIS — Z4800 Encounter for change or removal of nonsurgical wound dressing: Secondary | ICD-10-CM | POA: Insufficient documentation

## 2018-06-15 DIAGNOSIS — I10 Essential (primary) hypertension: Secondary | ICD-10-CM | POA: Diagnosis not present

## 2018-06-15 DIAGNOSIS — Z5189 Encounter for other specified aftercare: Secondary | ICD-10-CM

## 2018-06-15 DIAGNOSIS — Z79899 Other long term (current) drug therapy: Secondary | ICD-10-CM | POA: Diagnosis not present

## 2018-06-15 DIAGNOSIS — F17210 Nicotine dependence, cigarettes, uncomplicated: Secondary | ICD-10-CM | POA: Insufficient documentation

## 2018-06-15 NOTE — ED Triage Notes (Signed)
Pt here on Sunday for a wound check and possible kidney function.  Pt states wound seems to be doing better.

## 2018-06-15 NOTE — ED Notes (Signed)
Pt. Here for recheck of wound on rectum.  EDP to recheck with RN in room.

## 2018-06-18 NOTE — ED Provider Notes (Signed)
[facility] EMERGENCY DEPARTMENT Provider Note   CSN: [id number] Arrival date & time: 06/15/18  1815    History   Chief Complaint Chief Complaint  Patient presents with  . Wound Check    HPI James Downs is a 53 y.o. male.  HPI   53 year old male presenting for wound check.  He was seen in the emergency room on 06/13/2018.  In buttock abscess I&D.  He was discharged on antibiotics.  He reports compliance with him.  He states that the symptoms have been improving.  He still having a small amount of drainage.  Overall his pain is improving.  No fevers.  Past Medical History:  Diagnosis Date  . Arthritis   . Depression   . Gout   . Hypercholesterolemia   . Hypertension    on meds  . Kidney stone   . Sleep apnea    uses CPAP    Patient Active Problem List   Diagnosis Date Noted  . 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 Weakness of left leg 11/04/2013  . Paresthesias 11/04/2013  . Lumbar radiculopathy 11/04/2013  . Degenerative disc disease, lumbar 11/04/2013  . ONYCHOMYCOSIS 12/23/2006  . HYPERLIPIDEMIA 12/23/2006  . ANXIETY DISORDER, GENERALIZED 12/23/2006  . TOBACCO ABUSE 12/23/2006  . SYNDROME, CARPAL TUNNEL 12/23/2006  . HYPERTENSION 12/23/2006  . NEPHROLITHIASIS, HX OF 12/23/2006    Past Surgical History:  Procedure Laterality Date  . FOOT SURGERY Right 1991  . HAND SURGERY Right 1985  . POSTERIOR LUMBAR FUSION 4 LEVEL N/A 11/29/2013   Procedure: Thoracic Eight, Thoracic Nine, Costotransversectomy   Thoracic Seven-Eight Thoracic Eight-Nine diskectomy;  Surgeon: Lisbeth RenshawNeelesh Nundkumar, MD;  Location: [facility];  Service: Neurosurgery;  Laterality: N/A;  Thoracic Eight, Thoracic Nine, Costotransversectomy   Thoracic Seven-Eight Thoracic Eight-Nine diskectomy        Home Medications    Prior to Admission medications   Medication Sig Start Date End Date Taking? Authorizing Provider  clindamycin (CLEOCIN)  300 MG capsule Take 1 capsule (300 mg total) by mouth 3 (three) times daily. X 7 days 06/13/18   Charlynne PanderYao, David Hsienta, MD  cyclobenzaprine (FLEXERIL) 10 MG tablet TK 1 T PO TID 03/23/18   [provider]  Fexofenadine HCl (ALLEGRA ALLERGY PO) Take by mouth.    [provider]  fluticasone (FLONASE) 50 MCG/ACT nasal spray Place 1 spray into both nostrils daily. 08/10/17   Maxwell CaulLayden, Lindsey A, PA-C  guaiFENesin (ROBITUSSIN) 100 MG/5ML liquid Take 5 mLs (100 mg total) by mouth every 6 (six) hours as needed for cough. 02/08/18   Caccavale, Sophia, PA-C  HYDROcodone-acetaminophen (NORCO/VICODIN) 5-325 MG tablet Take 1 tablet by mouth every 4 (four) hours as needed. 06/13/18   Charlynne PanderYao, David Hsienta, MD  ondansetron (ZOFRAN ODT) 4 MG disintegrating tablet Take 1 tablet (4 mg total) by mouth every 8 (eight) hours as needed for nausea or vomiting. 05/15/18   Maxwell CaulLayden, Lindsey A, PA-C  PARoxetine (PAXIL) 40 MG tablet TK 1 T PO QD IN THE MORNING 03/15/18   [provider]  predniSONE (STERAPRED UNI-PAK 21 TAB) 10 MG (21) TBPK tablet Take 6 tabs for 2 days, then 5 for 2 days, then 4 for 2 days, then 3 for 2 days, 2 for 2 days, then 1 for 2 days 02/11/18   Jacalyn LefevreHaviland, Julie, MD  telmisartan (MICARDIS) 20 MG tablet Take 1 tablet (20 mg total) by mouth daily. 04/26/17   Dartha LodgeFord, Kelsey N, PA-C  telmisartan-hydrochlorothiazide (MICARDIS HCT) 80-12.5  MG tablet TK 1 T PO QD 03/15/18   [provider]    Family History Family History  Problem Relation Age of Onset  . Cancer Father        BRAIN/PROSTATE/SKIN  . Hypertension Father   . Cancer Paternal Grandfather   . Heart failure Maternal Grandmother   . Diabetes Maternal Grandmother   . Hypertension Maternal Grandmother   . Hypertension Maternal Grandfather     Social History Social History   Tobacco Use  . Smoking status: Current Every Day Smoker    Packs/day: 1.00    Years: 30.00    Pack years: 30.00    Types: Cigarettes  . Smokeless  tobacco: Former Engineer, water Use Topics  . Alcohol use: No  . Drug use: No     Allergies   Patient has no known allergies.   Review of Systems Review of Systems  All systems reviewed and negative, other than as noted in HPI.  Physical Exam Updated Vital Signs BP 117/79 (BP Location: Right Arm)   Pulse (!) 107   Temp 97.9 F (36.6 C) (Oral)   Resp 14   Ht  (1.753 m)   Wt 111.1 kg   SpO2 98%   BMI 36.18 kg/m   Physical Exam Vitals signs and nursing note reviewed.  Constitutional:      General: He is not in acute distress.    Appearance: He is well-developed.  HENT:     Head: Normocephalic and atraumatic.  Eyes:     General:        Right eye: No discharge.        Left eye: No discharge.     Conjunctiva/sclera: Conjunctivae normal.  Neck:     Musculoskeletal: Neck supple.  Cardiovascular:     Rate and Rhythm: Normal rate and regular rhythm.     Heart sounds: Normal heart sounds. No murmur. No friction rub. No gallop.   Pulmonary:     Effort: Pulmonary effort is normal. No respiratory distress.     Breath sounds: Normal breath sounds.  Abdominal:     General: There is no distension.     Palpations: Abdomen is soft.     Tenderness: There is no abdominal tenderness.  Genitourinary:    Comments: Site of recent I&D appears to be healing appropriately. No drainage noted. No cellulitis.  Musculoskeletal:        General: No tenderness.  Skin:    General: Skin is warm and dry.  Neurological:     Mental Status: He is alert.     Sensory: Sensory deficit:    Psychiatric:        Behavior: Behavior normal.        Thought Content: Thought content normal.      ED Treatments / Results  Labs (all labs ordered are listed, but only abnormal results are displayed) Labs Reviewed - No data to display  EKG None  Radiology No results found.  Procedures Procedures (including critical care time)  Medications Ordered in ED Medications - No data to display    Initial Impression / Assessment and Plan / ED Course  I have reviewed the triage vital signs and the nursing notes.  Pertinent labs & imaging results that were available during my care of the patient were reviewed by me and considered in my medical decision making (see chart for details).  Prior ED note reviewed.   53 year old male presenting for wound recheck.  Appears to be healing  appropriately.  Continue antibiotics until finished.  Continued wound care instructions return precautions were reiterated.   Final Clinical Impressions(s) / ED Diagnoses   Final diagnoses:  Visit for wound check    ED Discharge Orders    None       Raeford Razor, MD 06/18/18 2254

## 2018-08-16 ENCOUNTER — Other Ambulatory Visit: Payer: Self-pay

## 2018-08-16 ENCOUNTER — Emergency Department (HOSPITAL_BASED_OUTPATIENT_CLINIC_OR_DEPARTMENT_OTHER)
Admission: EM | Admit: 2018-08-16 | Discharge: 2018-08-17 | Disposition: A | Discharge status: Home / Self Care | Payer: BC Managed Care – PPO | Attending: Emergency Medicine | Admitting: Emergency Medicine

## 2018-08-16 ENCOUNTER — Encounter (HOSPITAL_BASED_OUTPATIENT_CLINIC_OR_DEPARTMENT_OTHER): Payer: Self-pay

## 2018-08-16 ENCOUNTER — Emergency Department (HOSPITAL_BASED_OUTPATIENT_CLINIC_OR_DEPARTMENT_OTHER): Payer: BC Managed Care – PPO

## 2018-08-16 DIAGNOSIS — J181 Lobar pneumonia, unspecified organism: Secondary | ICD-10-CM | POA: Insufficient documentation

## 2018-08-16 DIAGNOSIS — Z79899 Other long term (current) drug therapy: Secondary | ICD-10-CM | POA: Insufficient documentation

## 2018-08-16 DIAGNOSIS — J189 Pneumonia, unspecified organism: Secondary | ICD-10-CM

## 2018-08-16 DIAGNOSIS — Z20828 Contact with and (suspected) exposure to other viral communicable diseases: Secondary | ICD-10-CM | POA: Insufficient documentation

## 2018-08-16 DIAGNOSIS — F17210 Nicotine dependence, cigarettes, uncomplicated: Secondary | ICD-10-CM | POA: Diagnosis not present

## 2018-08-16 DIAGNOSIS — I10 Essential (primary) hypertension: Secondary | ICD-10-CM | POA: Diagnosis not present

## 2018-08-16 DIAGNOSIS — R918 Other nonspecific abnormal finding of lung field: Secondary | ICD-10-CM | POA: Diagnosis not present

## 2018-08-16 DIAGNOSIS — R05 Cough: Secondary | ICD-10-CM | POA: Diagnosis not present

## 2018-08-16 DIAGNOSIS — J9811 Atelectasis: Secondary | ICD-10-CM | POA: Diagnosis not present

## 2018-08-16 LAB — CBC
HCT: 44 % (ref 39.0–52.0)
Hemoglobin: 14.1 g/dL (ref 13.0–17.0)
MCH: 29.8 pg (ref 26.0–34.0)
MCHC: 32 g/dL (ref 30.0–36.0)
MCV: 93 fL (ref 80.0–100.0)
Platelets: 189 10*3/uL (ref 150–400)
RBC: 4.73 MIL/uL (ref 4.22–5.81)
RDW: 14.5 % (ref 11.5–15.5)
WBC: 9.5 10*3/uL (ref 4.0–10.5)
nRBC: 0 % (ref 0.0–0.2)

## 2018-08-16 LAB — BASIC METABOLIC PANEL
Anion gap: 9 (ref 5–15)
BUN: 16 mg/dL (ref 6–20)
CO2: 22 mmol/L (ref 22–32)
Calcium: 8.7 mg/dL — ABNORMAL LOW (ref 8.9–10.3)
Chloride: 108 mmol/L (ref 98–111)
Creatinine, Ser: 1.58 mg/dL — ABNORMAL HIGH (ref 0.61–1.24)
GFR calc Af Amer: 57 mL/min — ABNORMAL LOW (ref >60)
GFR calc non Af Amer: 49 mL/min — ABNORMAL LOW (ref >60)
Glucose, Bld: 85 mg/dL (ref 70–99)
Potassium: 4 mmol/L (ref 3.5–5.1)
Sodium: 139 mmol/L (ref 135–145)

## 2018-08-16 LAB — SARS CORONAVIRUS 2 AG (30 MIN TAT): SARS Coronavirus 2 Ag: NEGATIVE

## 2018-08-16 MED ORDER — AZITHROMYCIN 500 MG IV SOLR
INTRAVENOUS | Status: AC
  Filled 2018-08-16: qty 500

## 2018-08-16 MED ORDER — SODIUM CHLORIDE 0.9 % IV SOLN
500.0000 mg | Freq: Once | INTRAVENOUS | Status: AC
  Administered 2018-08-16: 500 mg via INTRAVENOUS
  Filled 2018-08-16: qty 500

## 2018-08-16 MED ORDER — CEFDINIR 300 MG PO CAPS: 300.0000 mg | ORAL_CAPSULE | Freq: Two times a day (BID) | ORAL | 0 refills | Status: DC

## 2018-08-16 MED ORDER — SODIUM CHLORIDE 0.9 % IV SOLN
1.0000 g | Freq: Once | INTRAVENOUS | Status: AC
  Administered 2018-08-16: 1 g via INTRAVENOUS
  Filled 2018-08-16: qty 10

## 2018-08-16 MED ORDER — AZITHROMYCIN 250 MG PO TABS: 250.0000 mg | ORAL_TABLET | Freq: Every day | ORAL | 0 refills | Status: DC

## 2018-08-16 MED ORDER — SODIUM CHLORIDE 0.9 % IV SOLN
INTRAVENOUS | Status: DC | PRN
  Administered 2018-08-16: 250 mL via INTRAVENOUS

## 2018-08-16 NOTE — ED Triage Notes (Signed)
C/i flu like sx day 2- recent visit to [facility] gait

## 2018-08-16 NOTE — ED Provider Notes (Signed)
[facility] EMERGENCY DEPARTMENT Provider Note   CSN: [id number] Arrival date & time: 08/16/18  1740     History   Chief Complaint Chief Complaint  Patient presents with  . Cough    HPI James Downs is a 53 y.o. male.     Patient c/o low grade fever in past day, general body aches, non prod cough for the past day. Symptoms acute onset, moderate, persistent. No specific exacerbating factors. States just got [place] [facility]. Denies specific known covid+ exposure. No headaches. No neck pain or stiffness. Denies chest pain. No abd pain or nvd. No dysuria or gu c/o.   The history is provided by the patient.  Cough Associated symptoms: fever   Associated symptoms: no chest pain, no headaches, no rash, no shortness of breath and no sore throat     Past Medical History:  Diagnosis Date  . Arthritis   . Depression   . Gout   . Hypercholesterolemia   . Hypertension    on meds  . Kidney stone   . Sleep apnea    uses CPAP    Patient Active Problem List   Diagnosis Date Noted  . 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 Weakness of left leg 11/04/2013  . Paresthesias 11/04/2013  . Lumbar radiculopathy 11/04/2013  . Degenerative disc disease, lumbar 11/04/2013  . ONYCHOMYCOSIS 12/23/2006  . HYPERLIPIDEMIA 12/23/2006  . ANXIETY DISORDER, GENERALIZED 12/23/2006  . TOBACCO ABUSE 12/23/2006  . SYNDROME, CARPAL TUNNEL 12/23/2006  . HYPERTENSION 12/23/2006  . NEPHROLITHIASIS, HX OF 12/23/2006    Past Surgical History:  Procedure Laterality Date  . FOOT SURGERY Right 1991  . HAND SURGERY Right 1985  . POSTERIOR LUMBAR FUSION 4 LEVEL N/A 11/29/2013   Procedure: Thoracic Eight, Thoracic Nine, Costotransversectomy   Thoracic Seven-Eight Thoracic Eight-Nine diskectomy;  Surgeon: Lisbeth RenshawNeelesh Nundkumar, MD;  Location: [facility];  Service: Neurosurgery;  Laterality: N/A;  Thoracic Eight, Thoracic Nine,  Costotransversectomy   Thoracic Seven-Eight Thoracic Eight-Nine diskectomy        Home Medications    Prior to Admission medications   Medication Sig Start Date End Date Taking? Authorizing Provider  clindamycin (CLEOCIN) 300 MG capsule Take 1 capsule (300 mg total) by mouth 3 (three) times daily. X 7 days 06/13/18   Charlynne PanderYao, David Hsienta, MD  cyclobenzaprine (FLEXERIL) 10 MG tablet TK 1 T PO TID 03/23/18   [provider]  Fexofenadine HCl (ALLEGRA ALLERGY PO) Take by mouth.    [provider]  fluticasone (FLONASE) 50 MCG/ACT nasal spray Place 1 spray into both nostrils daily. 08/10/17   Maxwell CaulLayden, Lindsey A, PA-C  guaiFENesin (ROBITUSSIN) 100 MG/5ML liquid Take 5 mLs (100 mg total) by mouth every 6 (six) hours as needed for cough. 02/08/18   Caccavale, Sophia, PA-C  HYDROcodone-acetaminophen (NORCO/VICODIN) 5-325 MG tablet Take 1 tablet by mouth every 4 (four) hours as needed. 06/13/18   Charlynne PanderYao, David Hsienta, MD  ondansetron (ZOFRAN ODT) 4 MG disintegrating tablet Take 1 tablet (4 mg total) by mouth every 8 (eight) hours as needed for nausea or vomiting. 05/15/18   Maxwell CaulLayden, Lindsey A, PA-C  PARoxetine (PAXIL) 40 MG tablet TK 1 T PO QD IN THE MORNING 03/15/18   [provider]  predniSONE (STERAPRED UNI-PAK 21 TAB) 10 MG (21) TBPK tablet Take 6 tabs for 2 days, then 5 for 2 days, then 4 for 2 days, then 3 for 2 days, 2 for 2 days, then  1 for 2 days 02/11/18   Jacalyn LefevreHaviland, Julie, MD  telmisartan (MICARDIS) 20 MG tablet Take 1 tablet (20 mg total) by mouth daily. 04/26/17   Dartha LodgeFord, Kelsey N, PA-C  telmisartan-hydrochlorothiazide (MICARDIS HCT) 80-12.5 MG tablet TK 1 T PO QD 03/15/18   [provider]    Family History Family History  Problem Relation Age of Onset  . Cancer Father        BRAIN/PROSTATE/SKIN  . Hypertension Father   . Cancer Paternal Grandfather   . Heart failure Maternal Grandmother   . Diabetes Maternal Grandmother   . Hypertension Maternal Grandmother    . Hypertension Maternal Grandfather     Social History Social History   Tobacco Use  . Smoking status: Current Every Day Smoker    Packs/day: 1.00    Years: 30.00    Pack years: 30.00    Types: Cigarettes  . Smokeless tobacco: Former Engineer, waterUser  Substance Use Topics  . Alcohol use: No  . Drug use: No     Allergies   Patient has no known allergies.   Review of Systems Review of Systems  Constitutional: Positive for fever.  HENT: Negative for sore throat.   Eyes: Negative for redness.  Respiratory: Positive for cough. Negative for shortness of breath.   Cardiovascular: Negative for chest pain.  Gastrointestinal: Negative for abdominal pain, diarrhea and vomiting.  Endocrine: Negative for polyuria.  Genitourinary: Negative for dysuria and flank pain.  Musculoskeletal: Negative for back pain and neck pain.  Skin: Negative for rash.  Neurological: Negative for headaches.  Hematological: Does not bruise/bleed easily.  Psychiatric/Behavioral: Negative for confusion.     Physical Exam Updated Vital Signs BP 130/82 (BP Location: Left Arm)   Pulse 96   Temp 99.5 F (37.5 C) (Oral)   Resp 18   Ht 1.676 m (5\' 6" )   Wt 109.8 kg   SpO2 96%   BMI 39.06 kg/m   Physical Exam Vitals signs and nursing note reviewed.  Constitutional:      Appearance: Normal appearance. He is well-developed.  HENT:     Head: Atraumatic.     Nose: Nose normal.     Mouth/Throat:     Mouth: Mucous membranes are moist.     Pharynx: Oropharynx is clear.  Eyes:     General: No scleral icterus.    Conjunctiva/sclera: Conjunctivae normal.     Pupils: Pupils are equal, round, and reactive to light.  Neck:     Musculoskeletal: Normal range of motion and neck supple. No neck rigidity or muscular tenderness.     Trachea: No tracheal deviation.  Cardiovascular:     Rate and Rhythm: Normal rate and regular rhythm.     Pulses: Normal pulses.     Heart sounds: Normal heart sounds. No murmur. No  friction rub. No gallop.   Pulmonary:     Effort: Pulmonary effort is normal. No accessory muscle usage or respiratory distress.     Breath sounds: Normal breath sounds.  Abdominal:     General: Bowel sounds are normal. There is no distension.     Palpations: Abdomen is soft.     Tenderness: There is no abdominal tenderness. There is no guarding.  Genitourinary:    Comments: No cva tenderness. Musculoskeletal:        General: No swelling.     Comments: Small area erythema and warmth to left lower leg medially (pt states is sunburn). No fluctuance/abscess. No crepitus. No calf pain, tenderness, or swelling. Distal  pulses palp bil.  CTLS spine, non tender, aligned, no step off.   Lymphadenopathy:     Cervical: No cervical adenopathy.  Skin:    General: Skin is warm and dry.     Findings: No rash.  Neurological:     Mental Status: He is alert.     Comments: Alert, speech clear. Motor/sens grossly intact bil.   Psychiatric:        Mood and Affect: Mood normal.      ED Treatments / Results  Labs (all labs ordered are listed, but only abnormal results are displayed) Results for orders placed or performed during the hospital encounter of 08/16/18  SARS Coronavirus 2 (Hosp order,Performed in [facility] lab via [employer] [facility])   Specimen: Dry Nasal Swab ([employer] Now)  Result Value Ref Range   SARS Coronavirus 2 ([employer] Now) NEGATIVE NEGATIVE  Basic metabolic panel  Result Value Ref Range   Sodium 139 135 - 145 mmol/L   Potassium 4.0 3.5 - 5.1 mmol/L   Chloride 108 98 - 111 mmol/L   CO2 22 22 - 32 mmol/L   Glucose, Bld 85 70 - 99 mg/dL   BUN 16 6 - 20 mg/dL   Creatinine, Ser 1.611.58 (H) 0.61 - 1.24 mg/dL   Calcium 8.7 (L) 8.9 - 10.3 mg/dL   GFR calc non Af Amer 49 (L) >60 mL/min   GFR calc Af Amer 57 (L) >60 mL/min   Anion gap 9 5 - 15  CBC  Result Value Ref Range   WBC 9.5 4.0 - 10.5 K/uL   RBC 4.73 4.22 - 5.81 MIL/uL   Hemoglobin 14.1 13.0 - 17.0 g/dL   HCT 09.644.0 04.539.0 -  40.952.0 %   MCV 93.0 80.0 - 100.0 fL   MCH 29.8 26.0 - 34.0 pg   MCHC 32.0 30.0 - 36.0 g/dL   RDW 81.114.5 91.411.5 - 78.215.5 %   Platelets 189 150 - 400 K/uL   nRBC 0.0 0.0 - 0.2 %   Dg Chest Port 1 View  Result Date: 08/16/2018 CLINICAL DATA:  Fever and flu like symptoms for the past 2 days. EXAM: PORTABLE CHEST 1 VIEW COMPARISON:  Chest x-ray dated May 15, 2018. FINDINGS: The heart size and mediastinal contours are within normal limits. Normal pulmonary vascularity. Low lung volumes with mild patchy opacities at the lung bases. No focal consolidation, pleural effusion, or pneumothorax. No acute osseous abnormality. IMPRESSION: 1. Low lung volumes with mild patchy bibasilar opacities which could reflect atelectasis or infection, including potential viral pneumonia. Electronically Signed   By: Obie DredgeWilliam T Derry M.D.   On: 08/16/2018 20:45    EKG    Radiology No results found.  Procedures Procedures (including critical care time)  Medications Ordered in ED Medications - No data to display   Initial Impression / Assessment and Plan / ED Course  I have reviewed the triage vital signs and the nursing notes.  Pertinent labs & imaging results that were available during my care of the patient were reviewed by me and considered in my medical decision making (see chart for details).  Labs sent.   Cxr.   Reviewed nursing notes and prior charts for additional history.   James Downs was evaluated in Emergency Department on 08/17/2018 for the symptoms described in the history of present illness. He was evaluated in the context of the global COVID-19 pandemic, which necessitated consideration that the patient might be at risk for infection with the SARS-CoV-2 virus that  causes COVID-19. Institutional protocols and algorithms that pertain to the evaluation of patients at risk for COVID-19 are in a state of rapid change based on information released by regulatory bodies including the CDC and federal  and state organizations. These policies and algorithms were followed during the patient's care in the ED.  Labs reviewed by me - covid negative. Wbc 9.   cxr reviewed by me  - lower lobe pna.   Iv abx therapy given, rocephin and zithromax.   rx for home.   Discussed close pcp f/u.  Reassessment, pt breathing comfortably, pulse ox 99%, vitals normal. No increased wob. Pt appears stable for d/c.     Final Clinical Impressions(s) / ED Diagnoses   Final diagnoses:  None    ED Discharge Orders    None       Lajean Saver, MD 08/17/18 1028

## 2018-08-16 NOTE — Discharge Instructions (Signed)
It was our pleasure to provide your ER care today - we hope that you feel better.  Take antibiotics as prescribed.   Follow up with primary care doctor in the coming week if symptoms fail to improve/resolve.  Return to ER if worse, new symptoms, increased trouble breathing, other concern.

## 2018-10-22 DIAGNOSIS — N179 Acute kidney failure, unspecified: Secondary | ICD-10-CM | POA: Diagnosis not present

## 2018-10-22 DIAGNOSIS — R748 Abnormal levels of other serum enzymes: Secondary | ICD-10-CM | POA: Diagnosis not present

## 2018-10-26 DIAGNOSIS — Z23 Encounter for immunization: Secondary | ICD-10-CM | POA: Diagnosis not present

## 2018-10-26 DIAGNOSIS — N184 Chronic kidney disease, stage 4 (severe): Secondary | ICD-10-CM | POA: Diagnosis not present

## 2018-10-26 DIAGNOSIS — I129 Hypertensive chronic kidney disease with stage 1 through stage 4 chronic kidney disease, or unspecified chronic kidney disease: Secondary | ICD-10-CM | POA: Diagnosis not present

## 2018-10-26 DIAGNOSIS — E039 Hypothyroidism, unspecified: Secondary | ICD-10-CM | POA: Diagnosis not present

## 2018-10-26 DIAGNOSIS — R5383 Other fatigue: Secondary | ICD-10-CM | POA: Diagnosis not present

## 2018-11-05 DIAGNOSIS — N189 Chronic kidney disease, unspecified: Secondary | ICD-10-CM | POA: Diagnosis not present

## 2018-11-05 DIAGNOSIS — I129 Hypertensive chronic kidney disease with stage 1 through stage 4 chronic kidney disease, or unspecified chronic kidney disease: Secondary | ICD-10-CM | POA: Diagnosis not present

## 2018-11-05 DIAGNOSIS — D631 Anemia in chronic kidney disease: Secondary | ICD-10-CM | POA: Diagnosis not present

## 2018-11-05 DIAGNOSIS — N2581 Secondary hyperparathyroidism of renal origin: Secondary | ICD-10-CM | POA: Diagnosis not present

## 2018-11-05 DIAGNOSIS — N183 Chronic kidney disease, stage 3 (moderate): Secondary | ICD-10-CM | POA: Diagnosis not present

## 2018-11-08 ENCOUNTER — Emergency Department (HOSPITAL_BASED_OUTPATIENT_CLINIC_OR_DEPARTMENT_OTHER)
Admission: EM | Admit: 2018-11-08 | Discharge: 2018-11-08 | Disposition: A | Discharge status: Home / Self Care | Payer: BC Managed Care – PPO | Attending: Emergency Medicine | Admitting: Emergency Medicine

## 2018-11-08 ENCOUNTER — Other Ambulatory Visit: Payer: Self-pay

## 2018-11-08 ENCOUNTER — Encounter (HOSPITAL_BASED_OUTPATIENT_CLINIC_OR_DEPARTMENT_OTHER): Payer: Self-pay

## 2018-11-08 DIAGNOSIS — M109 Gout, unspecified: Secondary | ICD-10-CM

## 2018-11-08 DIAGNOSIS — Z79899 Other long term (current) drug therapy: Secondary | ICD-10-CM | POA: Diagnosis not present

## 2018-11-08 DIAGNOSIS — M79672 Pain in left foot: Secondary | ICD-10-CM | POA: Diagnosis not present

## 2018-11-08 DIAGNOSIS — F17210 Nicotine dependence, cigarettes, uncomplicated: Secondary | ICD-10-CM | POA: Diagnosis not present

## 2018-11-08 DIAGNOSIS — I10 Essential (primary) hypertension: Secondary | ICD-10-CM | POA: Insufficient documentation

## 2018-11-08 HISTORY — DX: Disorder of kidney and ureter, unspecified: N28.9

## 2018-11-08 HISTORY — DX: Unspecified injury of unspecified kidney, initial encounter: S37.009A

## 2018-11-08 MED ORDER — PREDNISONE 20 MG PO TABS: 40.0000 mg | ORAL_TABLET | Freq: Every day | ORAL | 0 refills | Status: DC

## 2018-11-08 MED ORDER — HYDROCODONE-ACETAMINOPHEN 5-325 MG PO TABS: 1.0000 | ORAL_TABLET | Freq: Four times a day (QID) | ORAL | 0 refills | Status: DC | PRN

## 2018-11-08 NOTE — ED Triage Notes (Signed)
Pt c/oleft foot pain x 2 days-states feels like gout-NAD

## 2018-11-08 NOTE — ED Provider Notes (Signed)
[facility] EMERGENCY DEPARTMENT Provider Note   CSN: [id number] Arrival date & time: 11/08/18  1646     History   Chief Complaint Chief Complaint  Patient presents with  . Foot Pain    HPI James Downs is a 53 y.o. male.     HPI Patient presents with left foot pain.  Has had for last couple days.  Feels as if it is gout.  States he has had gout twice before in his toes.  No fevers or chills.  No injury.  No other pain.  Has recently seen his nephrologist, Dr. Justin Mend for his kidney function. Past Medical History:  Diagnosis Date  . Arthritis   . Depression   . Gout   . Hypercholesterolemia   . Hypertension    on meds  . Kidney damage   . Kidney stone   . Sleep apnea    uses CPAP    Patient Active Problem List   Diagnosis Date Noted  . 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 Weakness of left leg 11/04/2013  . Paresthesias 11/04/2013  . Lumbar radiculopathy 11/04/2013  . Degenerative disc disease, lumbar 11/04/2013  . ONYCHOMYCOSIS 12/23/2006  . HYPERLIPIDEMIA 12/23/2006  . ANXIETY DISORDER, GENERALIZED 12/23/2006  . TOBACCO ABUSE 12/23/2006  . SYNDROME, CARPAL TUNNEL 12/23/2006  . HYPERTENSION 12/23/2006  . NEPHROLITHIASIS, HX OF 12/23/2006    Past Surgical History:  Procedure Laterality Date  . FOOT SURGERY Right 1991  . HAND SURGERY Right 1985  . POSTERIOR LUMBAR FUSION 4 LEVEL N/A 11/29/2013   Procedure: Thoracic Eight, Thoracic Nine, Costotransversectomy   Thoracic Seven-Eight Thoracic Eight-Nine diskectomy;  Surgeon: Consuella Lose, MD;  Location: [facility];  Service: Neurosurgery;  Laterality: N/A;  Thoracic Eight, Thoracic Nine, Costotransversectomy   Thoracic Seven-Eight Thoracic Eight-Nine diskectomy        Home Medications    Prior to Admission medications   Medication Sig Start Date End Date Taking? Authorizing Provider  azithromycin (ZITHROMAX Z-PAK) 250 MG tablet Take 1 tablet (250 mg  total) by mouth daily. 08/17/18   Lajean Saver, MD  cefdinir (OMNICEF) 300 MG capsule Take 1 capsule (300 mg total) by mouth 2 (two) times daily. 08/16/18   Lajean Saver, MD  clindamycin (CLEOCIN) 300 MG capsule Take 1 capsule (300 mg total) by mouth 3 (three) times daily. X 7 days 06/13/18   Drenda Freeze, MD  cyclobenzaprine (FLEXERIL) 10 MG tablet TK 1 T PO TID 03/23/18   [provider]  Fexofenadine HCl (ALLEGRA ALLERGY PO) Take by mouth.    [provider]  fluticasone (FLONASE) 50 MCG/ACT nasal spray Place 1 spray into both nostrils daily. 08/10/17   Volanda Napoleon, PA-C  guaiFENesin (ROBITUSSIN) 100 MG/5ML liquid Take 5 mLs (100 mg total) by mouth every 6 (six) hours as needed for cough. 02/08/18   Caccavale, Sophia, PA-C  HYDROcodone-acetaminophen (NORCO/VICODIN) 5-325 MG tablet Take 1-2 tablets by mouth every 6 (six) hours as needed. 11/08/18   Davonna Belling, MD  ondansetron (ZOFRAN ODT) 4 MG disintegrating tablet Take 1 tablet (4 mg total) by mouth every 8 (eight) hours as needed for nausea or vomiting. 05/15/18   Volanda Napoleon, PA-C  PARoxetine (PAXIL) 40 MG tablet TK 1 T PO QD IN THE MORNING 03/15/18   [provider]  predniSONE (DELTASONE) 20 MG tablet Take 2 tablets (40 mg total) by mouth daily. 11/08/18   Davonna Belling, MD  telmisartan (MICARDIS) 20 MG tablet Take  1 tablet (20 mg total) by mouth daily. 04/26/17   Dartha LodgeFord, Kelsey N, PA-C  telmisartan-hydrochlorothiazide (MICARDIS HCT) 80-12.5 MG tablet TK 1 T PO QD 03/15/18   [provider]    Family History Family History  Problem Relation Age of Onset  . Cancer Father        BRAIN/PROSTATE/SKIN  . Hypertension Father   . Cancer Paternal Grandfather   . Heart failure Maternal Grandmother   . Diabetes Maternal Grandmother   . Hypertension Maternal Grandmother   . Hypertension Maternal Grandfather     Social History Social History   Tobacco Use  . Smoking status: Current Every  Day Smoker    Packs/day: 1.00    Years: 30.00    Pack years: 30.00    Types: Cigarettes  . Smokeless tobacco: Former Engineer, waterUser  Substance Use Topics  . Alcohol use: No  . Drug use: No     Allergies   Patient has no known allergies.   Review of Systems Review of Systems  Constitutional: Negative for appetite change.  Respiratory: Negative for shortness of breath.   Musculoskeletal: Negative for back pain.       Left foot pain.  Skin: Negative for rash.  Neurological: Negative for weakness.     Physical Exam Updated Vital Signs BP 136/85 (BP Location: Right Arm)   Pulse 95   Temp 98.2 F (36.8 C) (Oral)   Resp 16   Ht 5' 7.5" (1.715 m)   Wt 103.9 kg   SpO2 100%   BMI 35.34 kg/m   Physical Exam Vitals signs and nursing note reviewed.  Cardiovascular:     Rate and Rhythm: Normal rate.  Musculoskeletal:     Comments: Tenderness over left foot second M TP joint.  No erythema.  No other tenderness on the foot.  Pulse intact.  Good capillary refill.  Neurological:     Mental Status: He is alert.      ED Treatments / Results  Labs (all labs ordered are listed, but only abnormal results are displayed) Labs Reviewed - No data to display  EKG None  Radiology No results found.  Procedures Procedures (including critical care time)  Medications Ordered in ED Medications - No data to display   Initial Impression / Assessment and Plan / ED Course  I have reviewed the triage vital signs and the nursing notes.  Pertinent labs & imaging results that were available during my care of the patient were reviewed by me and considered in my medical decision making (see chart for details).        Patient with foot pain.  I think likely gout.  Has had the same in the past.  Could be secondary to his renal disease.  Will treat with steroids and pain medicines.  Discharge home.  Final Clinical Impressions(s) / ED Diagnoses   Final diagnoses:  Acute gout of left foot,  unspecified cause    ED Discharge Orders         Ordered    predniSONE (DELTASONE) 20 MG tablet  Daily     11/08/18 1841    HYDROcodone-acetaminophen (NORCO/VICODIN) 5-325 MG tablet  Every 6 hours PRN     11/08/18 1841           Benjiman CorePickering, Nathan, MD 11/08/18 1849

## 2018-11-08 NOTE — ED Notes (Signed)
Left foot pain x 2 days  Denies inj

## 2018-11-16 DIAGNOSIS — I129 Hypertensive chronic kidney disease with stage 1 through stage 4 chronic kidney disease, or unspecified chronic kidney disease: Secondary | ICD-10-CM | POA: Diagnosis not present

## 2018-11-16 DIAGNOSIS — R5383 Other fatigue: Secondary | ICD-10-CM | POA: Diagnosis not present

## 2018-11-16 DIAGNOSIS — N184 Chronic kidney disease, stage 4 (severe): Secondary | ICD-10-CM | POA: Diagnosis not present

## 2018-11-16 DIAGNOSIS — E039 Hypothyroidism, unspecified: Secondary | ICD-10-CM | POA: Diagnosis not present

## 2018-11-24 DIAGNOSIS — I129 Hypertensive chronic kidney disease with stage 1 through stage 4 chronic kidney disease, or unspecified chronic kidney disease: Secondary | ICD-10-CM | POA: Diagnosis not present

## 2018-11-24 DIAGNOSIS — M10022 Idiopathic gout, left elbow: Secondary | ICD-10-CM | POA: Diagnosis not present

## 2018-11-24 DIAGNOSIS — N184 Chronic kidney disease, stage 4 (severe): Secondary | ICD-10-CM | POA: Diagnosis not present

## 2018-11-24 DIAGNOSIS — E785 Hyperlipidemia, unspecified: Secondary | ICD-10-CM | POA: Diagnosis not present

## 2018-12-07 DIAGNOSIS — I10 Essential (primary) hypertension: Secondary | ICD-10-CM | POA: Diagnosis not present

## 2018-12-07 DIAGNOSIS — N184 Chronic kidney disease, stage 4 (severe): Secondary | ICD-10-CM | POA: Diagnosis not present

## 2018-12-07 DIAGNOSIS — Z7189 Other specified counseling: Secondary | ICD-10-CM | POA: Diagnosis not present

## 2018-12-07 DIAGNOSIS — M109 Gout, unspecified: Secondary | ICD-10-CM | POA: Diagnosis not present

## 2018-12-10 ENCOUNTER — Emergency Department (HOSPITAL_BASED_OUTPATIENT_CLINIC_OR_DEPARTMENT_OTHER)
Admission: EM | Admit: 2018-12-10 | Discharge: 2018-12-10 | Disposition: A | Discharge status: Home / Self Care | Payer: BC Managed Care – PPO | Attending: Emergency Medicine | Admitting: Emergency Medicine

## 2018-12-10 ENCOUNTER — Other Ambulatory Visit: Payer: Self-pay

## 2018-12-10 ENCOUNTER — Emergency Department (HOSPITAL_BASED_OUTPATIENT_CLINIC_OR_DEPARTMENT_OTHER): Payer: BC Managed Care – PPO

## 2018-12-10 ENCOUNTER — Encounter (HOSPITAL_BASED_OUTPATIENT_CLINIC_OR_DEPARTMENT_OTHER): Payer: Self-pay | Admitting: *Deleted

## 2018-12-10 DIAGNOSIS — I10 Essential (primary) hypertension: Secondary | ICD-10-CM | POA: Insufficient documentation

## 2018-12-10 DIAGNOSIS — M25571 Pain in right ankle and joints of right foot: Secondary | ICD-10-CM | POA: Insufficient documentation

## 2018-12-10 DIAGNOSIS — F17210 Nicotine dependence, cigarettes, uncomplicated: Secondary | ICD-10-CM | POA: Insufficient documentation

## 2018-12-10 DIAGNOSIS — M79604 Pain in right leg: Secondary | ICD-10-CM | POA: Diagnosis not present

## 2018-12-10 DIAGNOSIS — Z79899 Other long term (current) drug therapy: Secondary | ICD-10-CM | POA: Insufficient documentation

## 2018-12-10 DIAGNOSIS — M25561 Pain in right knee: Secondary | ICD-10-CM | POA: Diagnosis not present

## 2018-12-10 MED ORDER — PREDNISONE 20 MG PO TABS: 40.0000 mg | ORAL_TABLET | Freq: Every day | ORAL | 0 refills | Status: AC

## 2018-12-10 MED ORDER — OXYCODONE HCL 5 MG PO CAPS: 5.0000 mg | ORAL_CAPSULE | Freq: Four times a day (QID) | ORAL | 0 refills | Status: DC | PRN

## 2018-12-10 MED ORDER — HYDROCODONE-ACETAMINOPHEN 5-325 MG PO TABS
1.0000 | ORAL_TABLET | Freq: Once | ORAL | Status: AC
  Administered 2018-12-10: 1 via ORAL
  Filled 2018-12-10: qty 1

## 2018-12-10 NOTE — ED Notes (Signed)
Patient transported to X-ray 

## 2018-12-10 NOTE — ED Notes (Signed)
Attempted to ambulate pt on crutches without success. Pt has a walker at home. Had pt demonstrate walker use and noted pt had more stability than the crutches. Pt assisted to POV via wheelchair.

## 2018-12-10 NOTE — Discharge Instructions (Signed)
Overall likely symptoms from gout or arthritis.  Take medications as prescribed.  Recommend ice, Tylenol as discussed.

## 2018-12-10 NOTE — ED Provider Notes (Addendum)
[facility] EMERGENCY DEPARTMENT Provider Note   CSN: [id number] Arrival date & time: 12/10/18  1258     History   Chief Complaint Chief Complaint  Patient presents with   Knee Pain   Ankle Pain    HPI James Downs is a 53 y.o. male.     The history is provided by the patient.  Knee Pain Location:  Knee (right ankle) Knee location:  R knee Pain details:    Quality:  Aching   Radiates to:  Does not radiate   Severity:  Mild   Onset quality:  Gradual   Duration:  3 days   Timing:  Constant Chronicity:  New Relieved by:  Nothing Worsened by:  Bearing weight Associated symptoms: no back pain, no decreased ROM, no fatigue, no fever, no itching, no muscle weakness, no neck pain, no numbness, no stiffness, no swelling and no tingling   Ankle Pain Associated symptoms: no back pain, no decreased ROM, no fatigue, no fever, no itching, no muscle weakness, no neck pain, no numbness, no stiffness, no swelling and no tingling     Past Medical History:  Diagnosis Date   Arthritis    Depression    Gout    Hypercholesterolemia    Hypertension    on meds   Kidney damage    Kidney stone    Sleep apnea    uses CPAP    Patient Active Problem List   Diagnosis Date Noted   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   Weakness of left leg 11/04/2013   Paresthesias 11/04/2013   Lumbar radiculopathy 11/04/2013   Degenerative disc disease, lumbar 11/04/2013   ONYCHOMYCOSIS 12/23/2006   HYPERLIPIDEMIA 12/23/2006   ANXIETY DISORDER, GENERALIZED 12/23/2006   TOBACCO ABUSE 12/23/2006   SYNDROME, CARPAL TUNNEL 12/23/2006   HYPERTENSION 12/23/2006   NEPHROLITHIASIS, HX OF 12/23/2006    Past Surgical History:  Procedure Laterality Date   FOOT SURGERY Right 1991   HAND SURGERY Right 1985   POSTERIOR LUMBAR FUSION 4 LEVEL N/A 11/29/2013   Procedure: Thoracic Eight, Thoracic Nine, Costotransversectomy    Thoracic Seven-Eight Thoracic Eight-Nine diskectomy;  Surgeon: Consuella Lose, MD;  Location: [facility];  Service: Neurosurgery;  Laterality: N/A;  Thoracic Eight, Thoracic Nine, Costotransversectomy   Thoracic Seven-Eight Thoracic Eight-Nine diskectomy        Home Medications    Prior to Admission medications   Medication Sig Start Date End Date Taking? Authorizing Provider  azithromycin (ZITHROMAX Z-PAK) 250 MG tablet Take 1 tablet (250 mg total) by mouth daily. 08/17/18   Lajean Saver, MD  cefdinir (OMNICEF) 300 MG capsule Take 1 capsule (300 mg total) by mouth 2 (two) times daily. 08/16/18   Lajean Saver, MD  clindamycin (CLEOCIN) 300 MG capsule Take 1 capsule (300 mg total) by mouth 3 (three) times daily. X 7 days 06/13/18   Drenda Freeze, MD  cyclobenzaprine (FLEXERIL) 10 MG tablet TK 1 T PO TID 03/23/18   [provider]  Fexofenadine HCl (ALLEGRA ALLERGY PO) Take by mouth.    [provider]  fluticasone (FLONASE) 50 MCG/ACT nasal spray Place 1 spray into both nostrils daily. 08/10/17   Volanda Napoleon, PA-C  guaiFENesin (ROBITUSSIN) 100 MG/5ML liquid Take 5 mLs (100 mg total) by mouth every 6 (six) hours as needed for cough. 02/08/18   Caccavale, Sophia, PA-C  HYDROcodone-acetaminophen (NORCO/VICODIN) 5-325 MG tablet Take 1-2 tablets by mouth every 6 (six) hours as needed. 11/08/18  Benjiman Core, MD  ondansetron (ZOFRAN ODT) 4 MG disintegrating tablet Take 1 tablet (4 mg total) by mouth every 8 (eight) hours as needed for nausea or vomiting. 05/15/18   Maxwell Caul, PA-C  oxycodone (OXY-IR) 5 MG capsule Take 1 capsule (5 mg total) by mouth every 6 (six) hours as needed for up to 10 doses. 12/10/18   Lyndal Alamillo, DO  PARoxetine (PAXIL) 40 MG tablet TK 1 T PO QD IN THE MORNING 03/15/18   [provider]  predniSONE (DELTASONE) 20 MG tablet Take 2 tablets (40 mg total) by mouth daily for 5 days. 12/10/18 12/15/18  Cacey Willow, DO    telmisartan (MICARDIS) 20 MG tablet Take 1 tablet (20 mg total) by mouth daily. 04/26/17   Dartha Lodge, PA-C  telmisartan-hydrochlorothiazide (MICARDIS HCT) 80-12.5 MG tablet TK 1 T PO QD 03/15/18   [provider]    Family History Family History  Problem Relation Age of Onset   Cancer Father        BRAIN/PROSTATE/SKIN   Hypertension Father    Cancer Paternal Grandfather    Heart failure Maternal Grandmother    Diabetes Maternal Grandmother    Hypertension Maternal Grandmother    Hypertension Maternal Grandfather     Social History Social History   Tobacco Use   Smoking status: Current Every Day Smoker    Packs/day: 1.00    Years: 30.00    Pack years: 30.00    Types: Cigarettes   Smokeless tobacco: Former Neurosurgeon  Substance Use Topics   Alcohol use: No   Drug use: No     Allergies   Patient has no known allergies.   Review of Systems Review of Systems  Constitutional: Negative for chills, fatigue and fever.  HENT: Negative for ear pain and sore throat.   Eyes: Negative for pain and visual disturbance.  Respiratory: Negative for cough and shortness of breath.   Cardiovascular: Negative for chest pain and palpitations.  Gastrointestinal: Negative for abdominal pain and vomiting.  Genitourinary: Negative for dysuria and hematuria.  Musculoskeletal: Positive for arthralgias. Negative for back pain, neck pain and stiffness.  Skin: Negative for color change, itching and rash.  Neurological: Negative for seizures and syncope.  All other systems reviewed and are negative.    Physical Exam Updated Vital Signs  ED Triage Vitals  Enc Vitals Group     BP 12/10/18 1314 112/76     Pulse Rate 12/10/18 1314 93     Resp 12/10/18 1314 18     Temp 12/10/18 1314 98.7 F (37.1 C)     Temp Source 12/10/18 1314 Oral     SpO2 12/10/18 1314 100 %     Weight 12/10/18 1312 225 lb (102.1 kg)     Height 12/10/18 1312 5\' 7"  (1.702 m)     Head Circumference --       Peak Flow --      Pain Score 12/10/18 1311 8     Pain Loc --      Pain Edu? --      Excl. in GC? --     Physical Exam Vitals signs and nursing note reviewed.  Constitutional:      General: He is not in acute distress.    Appearance: He is well-developed. He is not ill-appearing.  HENT:     Head: Normocephalic and atraumatic.     Nose: Nose normal.     Mouth/Throat:     Mouth: Mucous membranes are moist.  Eyes:     Extraocular Movements: Extraocular movements intact.     Conjunctiva/sclera: Conjunctivae normal.     Pupils: Pupils are equal, round, and reactive to light.  Neck:     Musculoskeletal: Normal range of motion and neck supple.  Cardiovascular:     Rate and Rhythm: Normal rate and regular rhythm.     Pulses: Normal pulses.     Heart sounds: Normal heart sounds. No murmur.  Pulmonary:     Effort: Pulmonary effort is normal. No respiratory distress.     Breath sounds: Normal breath sounds.  Abdominal:     General: There is no distension.     Palpations: Abdomen is soft.     Tenderness: There is no abdominal tenderness.  Musculoskeletal: Normal range of motion.        General: Tenderness (posterior right knee and medial right ankle) present. No swelling.     Right lower leg: No edema.     Left lower leg: No edema.  Skin:    General: Skin is warm and dry.     Capillary Refill: Capillary refill takes less than 2 seconds.  Neurological:     General: No focal deficit present.     Mental Status: He is alert and oriented to person, place, and time.     Cranial Nerves: No cranial nerve deficit.     Sensory: No sensory deficit.     Motor: No weakness.     Coordination: Coordination normal.     Comments: 5+ out of 5 strength throughout, normal sensation      ED Treatments / Results  Labs (all labs ordered are listed, but only abnormal results are displayed) Labs Reviewed - No data to display  EKG None  Radiology US Venous Img Lower Right (dvt  Study)  Result Date: 12/10/2018 CLINICAL DATA:  Right knee and ankle pain. EXAM: RIGHT LOWER EXTREMITY VENOUS DOPPLER ULTRASOUND TECHNIQUE: Gray-scale sonography with graded compression, as well as color Doppler and duplex ultrasound were performed to evaluate the lower extremity deep venous systems from the level of the common femoral vein and including the common femoral, femoral, profunda femoral, popliteal and calf veins including the posterior tibial, peroneal and gastrocnemius veins when visible. The superficial great saphenous vein was also interrogated. Spectral Doppler was utilized to evaluate flow at rest and with distal augmentation maneuvers in the common femoral, femoral and popliteal veins. COMPARISON:  None. FINDINGS: Contralateral Common Femoral Vein: Respiratory phasicity is normal and symmetric with the symptomatic side. No evidence of thrombus. Normal compressibility. Common Femoral Vein: No evidence of thrombus. Normal compressibility, respiratory phasicity and response to augmentation. Saphenofemoral Junction: No evidence of thrombus. Normal compressibility and flow on color Doppler imaging. Profunda Femoral Vein: No evidence of thrombus. Normal compressibility and flow on color Doppler imaging. Femoral Vein: No evidence of thrombus. Normal compressibility, respiratory phasicity and response to augmentation. Popliteal Vein: No evidence of thrombus. Normal compressibility, respiratory phasicity and response to augmentation. Calf Veins: No evidence of thrombus. Normal compressibility and flow on color Doppler imaging. Superficial Great Saphenous Vein: No evidence of thrombus. Normal compressibility. Venous Reflux:  None. Other Findings: No evidence of superficial thrombophlebitis or abnormal fluid collection. IMPRESSION: No evidence of right lower extremity deep venous thrombosis. Electronically Signed   By: Irish Lack M.D.   On: 12/10/2018 14:05    Procedures Procedures (including  critical care time)  Medications Ordered in ED Medications  HYDROcodone-acetaminophen (NORCO/VICODIN) 5-325 MG per tablet 1 tablet (1 tablet Oral Given  12/10/18 1425)     Initial Impression / Assessment and Plan / ED Course  I have reviewed the triage vital signs and the nursing notes.  Pertinent labs & imaging results that were available during my care of the patient were reviewed by me and considered in my medical decision making (see chart for details).     Jamone Gigi GinM Slay is a 53 year old male with history of depression, gout who presents to the ED with right knee and right ankle pain for the last several days.  Difficulty when he walks.  Recently finished a course of steroids for gout.  Patient has no obvious redness or swelling to right knee or right ankle.  He has some tenderness to the popliteal area in the right knee.  Some tenderness along the medial malleolus and the right ankle.  However, there does not appear to be any swelling, erythema.  No concern for infectious process.  Patient has good pulses in his feet bilaterally.  Doubt arterial occlusion.  Possibly arthritic, possibly gout.  We will get a DVT ultrasound and x-rays of the right knee and right ankle.  Will give narcotic pain medicine.  Will reevaluate following studies.  DVT study unremarkable.  X-rays of right knee and ankle unremarkable.  There is some mild swelling to the prepatellar space.  Likely arthritic type pain versus possibly gout.  Denies any specific trauma.  No concern for septic joint.  Will treat with steroids, pain medicine.  Recommend Tylenol, ice, Motrin.  Recommend follow-up with primary care doctor.  Patient discharged in the ED in good condition.  Given crutches.  This chart was dictated using voice recognition software.  Despite best efforts to proofread,  errors can occur which can change the documentation meaning.    Final Clinical Impressions(s) / ED Diagnoses   Final diagnoses:  Acute pain  of right knee  Acute right ankle pain    ED Discharge Orders         Ordered    oxycodone (OXY-IR) 5 MG capsule  Every 6 hours PRN     12/10/18 1425    predniSONE (DELTASONE) 20 MG tablet  Daily     12/10/18 1425           Adelei Scobey, Madelaine Bhatdam, DO 12/10/18 1426    Virgina Norfolkuratolo, Gabrelle Roca, DO 12/10/18 1429

## 2018-12-10 NOTE — ED Triage Notes (Signed)
Right knee and ankle x 2 days. No known injury. Unable to bare weight on his right leg.

## 2018-12-11 ENCOUNTER — Telehealth (HOSPITAL_BASED_OUTPATIENT_CLINIC_OR_DEPARTMENT_OTHER): Payer: Self-pay | Admitting: Emergency Medicine

## 2018-12-11 MED ORDER — OXYCODONE HCL 5 MG PO TABS: 5.0000 mg | ORAL_TABLET | ORAL | 0 refills | Status: DC | PRN

## 2018-12-11 NOTE — Telephone Encounter (Signed)
Patient was seen in the emergency department yesterday and prescribed oxycodone.  The pharmacy that they were sent to does not have that medication so the patient needs this prescription sent to a different pharmacy.

## 2018-12-13 DIAGNOSIS — M1009 Idiopathic gout, multiple sites: Secondary | ICD-10-CM | POA: Diagnosis not present

## 2018-12-13 DIAGNOSIS — N182 Chronic kidney disease, stage 2 (mild): Secondary | ICD-10-CM | POA: Diagnosis not present

## 2018-12-15 DIAGNOSIS — M25529 Pain in unspecified elbow: Secondary | ICD-10-CM | POA: Diagnosis not present

## 2018-12-15 DIAGNOSIS — M1A9XX1 Chronic gout, unspecified, with tophus (tophi): Secondary | ICD-10-CM | POA: Diagnosis not present

## 2018-12-15 DIAGNOSIS — M130 Polyarthritis, unspecified: Secondary | ICD-10-CM | POA: Diagnosis not present

## 2018-12-15 DIAGNOSIS — M25562 Pain in left knee: Secondary | ICD-10-CM | POA: Diagnosis not present

## 2018-12-15 DIAGNOSIS — M79671 Pain in right foot: Secondary | ICD-10-CM | POA: Diagnosis not present

## 2018-12-15 DIAGNOSIS — M79641 Pain in right hand: Secondary | ICD-10-CM | POA: Diagnosis not present

## 2018-12-15 DIAGNOSIS — M25561 Pain in right knee: Secondary | ICD-10-CM | POA: Diagnosis not present

## 2018-12-15 DIAGNOSIS — M25422 Effusion, left elbow: Secondary | ICD-10-CM | POA: Diagnosis not present

## 2018-12-15 DIAGNOSIS — M79672 Pain in left foot: Secondary | ICD-10-CM | POA: Diagnosis not present

## 2018-12-15 DIAGNOSIS — M25522 Pain in left elbow: Secondary | ICD-10-CM | POA: Diagnosis not present

## 2018-12-15 DIAGNOSIS — M79642 Pain in left hand: Secondary | ICD-10-CM | POA: Diagnosis not present

## 2018-12-30 DIAGNOSIS — M130 Polyarthritis, unspecified: Secondary | ICD-10-CM | POA: Diagnosis not present

## 2018-12-30 DIAGNOSIS — M79673 Pain in unspecified foot: Secondary | ICD-10-CM | POA: Diagnosis not present

## 2018-12-30 DIAGNOSIS — M25529 Pain in unspecified elbow: Secondary | ICD-10-CM | POA: Diagnosis not present

## 2018-12-30 DIAGNOSIS — M1A9XX1 Chronic gout, unspecified, with tophus (tophi): Secondary | ICD-10-CM | POA: Diagnosis not present

## 2019-01-06 ENCOUNTER — Encounter (HOSPITAL_BASED_OUTPATIENT_CLINIC_OR_DEPARTMENT_OTHER): Payer: Self-pay

## 2019-01-06 ENCOUNTER — Other Ambulatory Visit: Payer: Self-pay

## 2019-01-06 ENCOUNTER — Emergency Department (HOSPITAL_BASED_OUTPATIENT_CLINIC_OR_DEPARTMENT_OTHER): Payer: BC Managed Care – PPO

## 2019-01-06 ENCOUNTER — Emergency Department (HOSPITAL_BASED_OUTPATIENT_CLINIC_OR_DEPARTMENT_OTHER)
Admission: EM | Admit: 2019-01-06 | Discharge: 2019-01-06 | Disposition: A | Discharge status: Home / Self Care | Payer: BC Managed Care – PPO | Attending: Emergency Medicine | Admitting: Emergency Medicine

## 2019-01-06 DIAGNOSIS — Z79899 Other long term (current) drug therapy: Secondary | ICD-10-CM | POA: Diagnosis not present

## 2019-01-06 DIAGNOSIS — I10 Essential (primary) hypertension: Secondary | ICD-10-CM | POA: Diagnosis not present

## 2019-01-06 DIAGNOSIS — F17210 Nicotine dependence, cigarettes, uncomplicated: Secondary | ICD-10-CM | POA: Insufficient documentation

## 2019-01-06 DIAGNOSIS — Z20828 Contact with and (suspected) exposure to other viral communicable diseases: Secondary | ICD-10-CM | POA: Insufficient documentation

## 2019-01-06 DIAGNOSIS — R05 Cough: Secondary | ICD-10-CM | POA: Diagnosis not present

## 2019-01-06 DIAGNOSIS — R6889 Other general symptoms and signs: Secondary | ICD-10-CM

## 2019-01-06 DIAGNOSIS — J111 Influenza due to unidentified influenza virus with other respiratory manifestations: Secondary | ICD-10-CM | POA: Diagnosis not present

## 2019-01-06 MED ORDER — ACETAMINOPHEN 325 MG PO TABS
650.0000 mg | ORAL_TABLET | Freq: Once | ORAL | Status: AC
  Administered 2019-01-06: 650 mg via ORAL
  Filled 2019-01-06: qty 2

## 2019-01-06 NOTE — ED Provider Notes (Signed)
[facility] EMERGENCY DEPARTMENT Provider Note   CSN: [id number] Arrival date & time: 01/06/19  1805     History   Chief Complaint Chief Complaint  Patient presents with  . Cough  . Generalized Body Aches    HPI James Downs is a 53 y.o. male.     The history is provided by the patient.  Cough Cough characteristics:  Non-productive Sputum characteristics:  Nondescript Severity:  Mild Onset quality:  Gradual Duration:  2 days Timing:  Intermittent Progression:  Waxing and waning Chronicity:  New Context: upper respiratory infection   Relieved by:  Nothing Worsened by:  Nothing Associated symptoms: fever and myalgias   Associated symptoms: no chest pain, no chills, no ear pain, no rash, no shortness of breath and no sore throat     Past Medical History:  Diagnosis Date  . Arthritis   . Depression   . Gout   . Hypercholesterolemia   . Hypertension    on meds  . Kidney damage   . Kidney stone   . Sleep apnea    uses CPAP    Patient Active Problem List   Diagnosis Date Noted  . 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 Weakness of left leg 11/04/2013  . Paresthesias 11/04/2013  . Lumbar radiculopathy 11/04/2013  . Degenerative disc disease, lumbar 11/04/2013  . ONYCHOMYCOSIS 12/23/2006  . HYPERLIPIDEMIA 12/23/2006  . ANXIETY DISORDER, GENERALIZED 12/23/2006  . TOBACCO ABUSE 12/23/2006  . SYNDROME, CARPAL TUNNEL 12/23/2006  . HYPERTENSION 12/23/2006  . NEPHROLITHIASIS, HX OF 12/23/2006    Past Surgical History:  Procedure Laterality Date  . FOOT SURGERY Right 1991  . HAND SURGERY Right 1985  . POSTERIOR LUMBAR FUSION 4 LEVEL N/A 11/29/2013   Procedure: Thoracic Eight, Thoracic Nine, Costotransversectomy   Thoracic Seven-Eight Thoracic Eight-Nine diskectomy;  Surgeon: Lisbeth RenshawNeelesh Nundkumar, MD;  Location: [facility];  Service: Neurosurgery;  Laterality: N/A;  Thoracic Eight, Thoracic Nine,  Costotransversectomy   Thoracic Seven-Eight Thoracic Eight-Nine diskectomy        Home Medications    Prior to Admission medications   Medication Sig Start Date End Date Taking? Authorizing Provider  azithromycin (ZITHROMAX Z-PAK) 250 MG tablet Take 1 tablet (250 mg total) by mouth daily. 08/17/18   Cathren LaineSteinl, Kevin, MD  cefdinir (OMNICEF) 300 MG capsule Take 1 capsule (300 mg total) by mouth 2 (two) times daily. 08/16/18   Cathren LaineSteinl, Kevin, MD  clindamycin (CLEOCIN) 300 MG capsule Take 1 capsule (300 mg total) by mouth 3 (three) times daily. X 7 days 06/13/18   Charlynne PanderYao, David Hsienta, MD  cyclobenzaprine (FLEXERIL) 10 MG tablet TK 1 T PO TID 03/23/18   [provider]  Fexofenadine HCl (ALLEGRA ALLERGY PO) Take by mouth.    [provider]  fluticasone (FLONASE) 50 MCG/ACT nasal spray Place 1 spray into both nostrils daily. 08/10/17   Maxwell CaulLayden, Lindsey A, PA-C  guaiFENesin (ROBITUSSIN) 100 MG/5ML liquid Take 5 mLs (100 mg total) by mouth every 6 (six) hours as needed for cough. 02/08/18   Caccavale, Sophia, PA-C  HYDROcodone-acetaminophen (NORCO/VICODIN) 5-325 MG tablet Take 1-2 tablets by mouth every 6 (six) hours as needed. 11/08/18   Benjiman CorePickering, Nathan, MD  ondansetron (ZOFRAN ODT) 4 MG disintegrating tablet Take 1 tablet (4 mg total) by mouth every 8 (eight) hours as needed for nausea or vomiting. 05/15/18   Graciella FreerLayden, Lindsey A, PA-C  oxyCODONE (OXY IR/ROXICODONE) 5 MG immediate release tablet Take 1 tablet (5 mg total) by  mouth every 4 (four) hours as needed for severe pain. 12/11/18   Terrilee Files, MD  PARoxetine (PAXIL) 40 MG tablet TK 1 T PO QD IN THE MORNING 03/15/18   [provider]  telmisartan (MICARDIS) 20 MG tablet Take 1 tablet (20 mg total) by mouth daily. 04/26/17   Dartha Lodge, PA-C  telmisartan-hydrochlorothiazide (MICARDIS HCT) 80-12.5 MG tablet TK 1 T PO QD 03/15/18   [provider]    Family History Family History  Problem Relation Age of Onset   . Cancer Father        BRAIN/PROSTATE/SKIN  . Hypertension Father   . Cancer Paternal Grandfather   . Heart failure Maternal Grandmother   . Diabetes Maternal Grandmother   . Hypertension Maternal Grandmother   . Hypertension Maternal Grandfather     Social History Social History   Tobacco Use  . Smoking status: Current Every Day Smoker    Packs/day: 1.00    Years: 30.00    Pack years: 30.00    Types: Cigarettes  . Smokeless tobacco: Former Engineer, water Use Topics  . Alcohol use: No  . Drug use: No     Allergies   Patient has no known allergies.   Review of Systems Review of Systems  Constitutional: Positive for fever. Negative for chills.  HENT: Negative for ear pain and sore throat.   Eyes: Negative for pain and visual disturbance.  Respiratory: Positive for cough. Negative for shortness of breath.   Cardiovascular: Negative for chest pain and palpitations.  Gastrointestinal: Negative for abdominal pain and vomiting.  Genitourinary: Negative for dysuria and hematuria.  Musculoskeletal: Positive for myalgias. Negative for arthralgias and back pain.  Skin: Negative for color change and rash.  Neurological: Negative for seizures and syncope.  All other systems reviewed and are negative.    Physical Exam Updated Vital Signs  ED Triage Vitals [01/06/19 1818]  Enc Vitals Group     BP (!) 119/99     Pulse Rate 85     Resp 18     Temp 98.1 F (36.7 C)     Temp Source Oral     SpO2 99 %     Weight 237 lb 1.6 oz (107.5 kg)     Height 5\' 7"  (1.702 m)     Head Circumference      Peak Flow      Pain Score      Pain Loc      Pain Edu?      Excl. in GC?     Physical Exam Vitals signs and nursing note reviewed.  Constitutional:      General: He is not in acute distress.    Appearance: He is well-developed. He is not ill-appearing.  HENT:     Head: Normocephalic and atraumatic.     Nose: Nose normal.     Mouth/Throat:     Mouth: Mucous membranes are  moist.     Pharynx: No oropharyngeal exudate or posterior oropharyngeal erythema.  Eyes:     Conjunctiva/sclera: Conjunctivae normal.     Pupils: Pupils are equal, round, and reactive to light.  Neck:     Musculoskeletal: Neck supple.  Cardiovascular:     Rate and Rhythm: Normal rate and regular rhythm.     Pulses: Normal pulses.     Heart sounds: Normal heart sounds. No murmur.  Pulmonary:     Effort: Pulmonary effort is normal. No respiratory distress.     Breath sounds:  Normal breath sounds.  Abdominal:     Palpations: Abdomen is soft.     Tenderness: There is no abdominal tenderness.  Musculoskeletal: Normal range of motion.        General: No tenderness.  Skin:    General: Skin is warm and dry.     Capillary Refill: Capillary refill takes less than 2 seconds.  Neurological:     General: No focal deficit present.     Mental Status: He is alert.      ED Treatments / Results  Labs (all labs ordered are listed, but only abnormal results are displayed) Labs Reviewed  SARS CORONAVIRUS 2 (TAT 6-24 HRS)    EKG EKG Interpretation  Date/Time:  Thursday January 06 2019 18:26:52 EST Ventricular Rate:  82 PR Interval:    QRS Duration: 81 QT Interval:  364 QTC Calculation: 426 R Axis:   53 Text Interpretation: Sinus rhythm Abnormal R-wave progression, early transition Confirmed by Lennice Sites [phone]) on 01/06/2019 6:41:45 PM   Radiology Dg Chest Portable 1 View  Result Date: 01/06/2019 CLINICAL DATA:  Cough and congestion. EXAM: PORTABLE CHEST 1 VIEW COMPARISON:  08/16/2018 FINDINGS: The cardiomediastinal contours are normal. Resolved bibasilar opacities from prior exam. Pulmonary vasculature is normal. No consolidation, pleural effusion, or pneumothorax. No acute osseous abnormalities are seen. IMPRESSION: No acute abnormality. Resolved bibasilar opacities from June 2020 radiograph. Electronically Signed   By: Keith Rake M.D.   On: 01/06/2019 19:01     Procedures Procedures (including critical care time)  Medications Ordered in ED Medications  acetaminophen (TYLENOL) tablet 650 mg (650 mg Oral Given 01/06/19 1850)     Initial Impression / Assessment and Plan / ED Course  I have reviewed the triage vital signs and the nursing notes.  Pertinent labs & imaging results that were available during my care of the patient were reviewed by me and considered in my medical decision making (see chart for details).        James Downs is a 53 year old male with history of gout who presents to the ED with viral type symptoms.  Patient with normal vitals.  No fever.  Wife at home sick with similar symptoms.  She tested negative for coronavirus this past week.  Has had a cough but no sputum production.  Chest x-ray shows no signs of pneumonia.  No signs inflammation.  Denies any urinary symptoms.  No chest pain, no severe shortness of breath.  Overall patient is well-appearing.  Has had subjective fever at home.  Denies any nausea, vomiting, diarrhea.  Overall suspect viral process.  Possibly coronavirus.  Have been tested for coronavirus and recommend self-isolation at home.  Recommend Tylenol, Motrin, hydration at home.  Understands return precautions.  This chart was dictated using voice recognition software.  Despite best efforts to proofread,  errors can occur which can change the documentation meaning.  James Downs was evaluated in Emergency Department on 01/06/2019 for the symptoms described in the history of present illness. He was evaluated in the context of the global COVID-19 pandemic, which necessitated consideration that the patient might be at risk for infection with the SARS-CoV-2 virus that causes COVID-19. Institutional protocols and algorithms that pertain to the evaluation of patients at risk for COVID-19 are in a state of rapid change based on information released by regulatory bodies including the CDC and federal and  state organizations. These policies and algorithms were followed during the patient's care in the ED.   Final Clinical Impressions(s) /  ED Diagnoses   Final diagnoses:  Flu-like symptoms    ED Discharge Orders    None       Virgina Norfolk, DO 01/06/19 1910

## 2019-01-06 NOTE — Discharge Instructions (Signed)
Chest x-ray is normal.  Self isolate at home until you hear back about your test.  Continue Tylenol Motrin and hydration at home.  Return to the ED if your symptoms worsen as discussed.

## 2019-01-06 NOTE — ED Triage Notes (Signed)
Pt states cough congestion body aches past 2 days, wife has had same symptoms for one week, was tested negative for covid.  No fever, mild shortness of breath.

## 2019-01-07 LAB — SARS CORONAVIRUS 2 (TAT 6-24 HRS): SARS Coronavirus 2: NEGATIVE

## 2019-02-01 ENCOUNTER — Emergency Department (HOSPITAL_BASED_OUTPATIENT_CLINIC_OR_DEPARTMENT_OTHER)
Admission: EM | Admit: 2019-02-01 | Discharge: 2019-02-01 | Disposition: A | Discharge status: Home / Self Care | Payer: BC Managed Care – PPO | Attending: Emergency Medicine | Admitting: Emergency Medicine

## 2019-02-01 ENCOUNTER — Encounter (HOSPITAL_BASED_OUTPATIENT_CLINIC_OR_DEPARTMENT_OTHER): Payer: Self-pay

## 2019-02-01 ENCOUNTER — Emergency Department (HOSPITAL_BASED_OUTPATIENT_CLINIC_OR_DEPARTMENT_OTHER): Payer: BC Managed Care – PPO

## 2019-02-01 ENCOUNTER — Other Ambulatory Visit: Payer: Self-pay

## 2019-02-01 DIAGNOSIS — I10 Essential (primary) hypertension: Secondary | ICD-10-CM | POA: Insufficient documentation

## 2019-02-01 DIAGNOSIS — R509 Fever, unspecified: Secondary | ICD-10-CM | POA: Diagnosis not present

## 2019-02-01 DIAGNOSIS — Z20828 Contact with and (suspected) exposure to other viral communicable diseases: Secondary | ICD-10-CM | POA: Insufficient documentation

## 2019-02-01 DIAGNOSIS — F17210 Nicotine dependence, cigarettes, uncomplicated: Secondary | ICD-10-CM | POA: Diagnosis not present

## 2019-02-01 DIAGNOSIS — J069 Acute upper respiratory infection, unspecified: Secondary | ICD-10-CM | POA: Insufficient documentation

## 2019-02-01 DIAGNOSIS — R05 Cough: Secondary | ICD-10-CM | POA: Diagnosis not present

## 2019-02-01 DIAGNOSIS — Z79899 Other long term (current) drug therapy: Secondary | ICD-10-CM | POA: Insufficient documentation

## 2019-02-01 DIAGNOSIS — E782 Mixed hyperlipidemia: Secondary | ICD-10-CM | POA: Insufficient documentation

## 2019-02-01 DIAGNOSIS — B9789 Other viral agents as the cause of diseases classified elsewhere: Secondary | ICD-10-CM | POA: Diagnosis not present

## 2019-02-01 LAB — CBC WITH DIFFERENTIAL/PLATELET
Abs Immature Granulocytes: 0.04 10*3/uL (ref 0.00–0.07)
Basophils Absolute: 0.1 10*3/uL (ref 0.0–0.1)
Basophils Relative: 1 %
Eosinophils Absolute: 0 10*3/uL (ref 0.0–0.5)
Eosinophils Relative: 0 %
HCT: 39.8 % (ref 39.0–52.0)
Hemoglobin: 12.9 g/dL — ABNORMAL LOW (ref 13.0–17.0)
Immature Granulocytes: 0 %
Lymphocytes Relative: 15 %
Lymphs Abs: 1.7 10*3/uL (ref 0.7–4.0)
MCH: 29.6 pg (ref 26.0–34.0)
MCHC: 32.4 g/dL (ref 30.0–36.0)
MCV: 91.3 fL (ref 80.0–100.0)
Monocytes Absolute: 1.3 10*3/uL — ABNORMAL HIGH (ref 0.1–1.0)
Monocytes Relative: 11 %
Neutro Abs: 8.4 10*3/uL — ABNORMAL HIGH (ref 1.7–7.7)
Neutrophils Relative %: 73 %
Platelets: 241 10*3/uL (ref 150–400)
RBC: 4.36 MIL/uL (ref 4.22–5.81)
RDW: 13.3 % (ref 11.5–15.5)
WBC: 11.4 10*3/uL — ABNORMAL HIGH (ref 4.0–10.5)
nRBC: 0 % (ref 0.0–0.2)

## 2019-02-01 LAB — COMPREHENSIVE METABOLIC PANEL
ALT: 16 U/L (ref 0–44)
AST: 19 U/L (ref 15–41)
Albumin: 3.5 g/dL (ref 3.5–5.0)
Alkaline Phosphatase: 50 U/L (ref 38–126)
Anion gap: 7 (ref 5–15)
BUN: 11 mg/dL (ref 6–20)
CO2: 20 mmol/L — ABNORMAL LOW (ref 22–32)
Calcium: 8.8 mg/dL — ABNORMAL LOW (ref 8.9–10.3)
Chloride: 108 mmol/L (ref 98–111)
Creatinine, Ser: 1.51 mg/dL — ABNORMAL HIGH (ref 0.61–1.24)
GFR calc Af Amer: 60 mL/min — ABNORMAL LOW (ref >60)
GFR calc non Af Amer: 52 mL/min — ABNORMAL LOW (ref >60)
Glucose, Bld: 106 mg/dL — ABNORMAL HIGH (ref 70–99)
Potassium: 3.8 mmol/L (ref 3.5–5.1)
Sodium: 135 mmol/L (ref 135–145)
Total Bilirubin: 0.7 mg/dL (ref 0.3–1.2)
Total Protein: 6.6 g/dL (ref 6.5–8.1)

## 2019-02-01 LAB — SARS CORONAVIRUS 2 AG (30 MIN TAT): SARS Coronavirus 2 Ag: NEGATIVE

## 2019-02-01 MED ORDER — ACETAMINOPHEN 325 MG PO TABS
650.0000 mg | ORAL_TABLET | Freq: Once | ORAL | Status: AC
  Administered 2019-02-01: 650 mg via ORAL
  Filled 2019-02-01: qty 2

## 2019-02-01 NOTE — ED Provider Notes (Signed)
[facility] EMERGENCY DEPARTMENT Provider [id number]: [id number] Arrival date & time: 02/01/19  1937     History   Chief Complaint Chief Complaint  Patient presents with  . Cough    HPI James Downs is a 53 y.o. male.     53 y.o male with a PMH of Depression presents to the ED with a chief complaint of cough, body aches, generalized weakness.  Patient reports the symptoms began about 2 days ago, he reports being at work and having his temperature recorded with a T-max of 102.  He reports he has been taking DayQuil along with NyQuil to help with his symptoms without improvement.  He feels overall short of breath, this is exacerbated with ambulation.  He does have 2 prior test for COVID-19 which were negative.  He does not have any chest pain on today's exam, no prior history of blood clots.  She is currently employed at a [employer] in [place].  The history is provided by the patient.    Past Medical History:  Diagnosis Date  . Arthritis   . Depression   . Gout   . Hypercholesterolemia   . Hypertension    on meds  . Kidney damage   . Kidney stone   . Sleep apnea    uses CPAP    Patient Active Problem List   Diagnosis Date Noted  . 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 Weakness of left leg 11/04/2013  . Paresthesias 11/04/2013  . Lumbar radiculopathy 11/04/2013  . Degenerative disc disease, lumbar 11/04/2013  . ONYCHOMYCOSIS 12/23/2006  . HYPERLIPIDEMIA 12/23/2006  . ANXIETY DISORDER, GENERALIZED 12/23/2006  . TOBACCO ABUSE 12/23/2006  . SYNDROME, CARPAL TUNNEL 12/23/2006  . HYPERTENSION 12/23/2006  . NEPHROLITHIASIS, HX OF 12/23/2006    Past Surgical History:  Procedure Laterality Date  . FOOT SURGERY Right 1991  . HAND SURGERY Right 1985  . POSTERIOR LUMBAR FUSION 4 LEVEL N/A 11/29/2013   Procedure: Thoracic Eight, Thoracic Nine, Costotransversectomy   Thoracic Seven-Eight Thoracic Eight-Nine  diskectomy;  Surgeon: Lisbeth Renshaw, MD;  Location: [facility];  Service: Neurosurgery;  Laterality: N/A;  Thoracic Eight, Thoracic Nine, Costotransversectomy   Thoracic Seven-Eight Thoracic Eight-Nine diskectomy        Home Medications    Prior to Admission medications   Medication Sig Start Date End Date Taking? Authorizing Provider  azithromycin (ZITHROMAX Z-PAK) 250 MG tablet Take 1 tablet (250 mg total) by mouth daily. 08/17/18   Cathren Laine, MD  cefdinir (OMNICEF) 300 MG capsule Take 1 capsule (300 mg total) by mouth 2 (two) times daily. 08/16/18   Cathren Laine, MD  clindamycin (CLEOCIN) 300 MG capsule Take 1 capsule (300 mg total) by mouth 3 (three) times daily. X 7 days 06/13/18   Charlynne Pander, MD  cyclobenzaprine (FLEXERIL) 10 MG tablet TK 1 T PO TID 03/23/18   [provider]  Fexofenadine HCl (ALLEGRA ALLERGY PO) Take by mouth.    [provider]  fluticasone (FLONASE) 50 MCG/ACT nasal spray Place 1 spray into both nostrils daily. 08/10/17   Maxwell Caul, PA-C  guaiFENesin (ROBITUSSIN) 100 MG/5ML liquid Take 5 mLs (100 mg total) by mouth every 6 (six) hours as needed for cough. 02/08/18   Caccavale, Sophia, PA-C  HYDROcodone-acetaminophen (NORCO/VICODIN) 5-325 MG tablet Take 1-2 tablets by mouth every 6 (six) hours as needed. 11/08/18   Benjiman Core, MD  ondansetron (ZOFRAN ODT) 4 MG disintegrating tablet Take 1  tablet (4 mg total) by mouth every 8 (eight) hours as needed for nausea or vomiting. 05/15/18   Graciella FreerLayden, Lindsey A, PA-C  oxyCODONE (OXY IR/ROXICODONE) 5 MG immediate release tablet Take 1 tablet (5 mg total) by mouth every 4 (four) hours as needed for severe pain. 12/11/18   Terrilee FilesButler, Michael C, MD  PARoxetine (PAXIL) 40 MG tablet TK 1 T PO QD IN THE MORNING 03/15/18   [provider]  telmisartan (MICARDIS) 20 MG tablet Take 1 tablet (20 mg total) by mouth daily. 04/26/17   Dartha LodgeFord, Kelsey N, PA-C  telmisartan-hydrochlorothiazide  (MICARDIS HCT) 80-12.5 MG tablet TK 1 T PO QD 03/15/18   [provider]    Family History Family History  Problem Relation Age of Onset  . Cancer Father        BRAIN/PROSTATE/SKIN  . Hypertension Father   . Cancer Paternal Grandfather   . Heart failure Maternal Grandmother   . Diabetes Maternal Grandmother   . Hypertension Maternal Grandmother   . Hypertension Maternal Grandfather     Social History Social History   Tobacco Use  . Smoking status: Current Every Day Smoker    Packs/day: 1.00    Years: 30.00    Pack years: 30.00    Types: Cigarettes  . Smokeless tobacco: Former Engineer, waterUser  Substance Use Topics  . Alcohol use: No  . Drug use: No     Allergies   Patient has no known allergies.   Review of Systems Review of Systems  Constitutional: Negative for fever.  HENT: Negative for rhinorrhea, sinus pain and sore throat.   Respiratory: Positive for cough and shortness of breath.   Cardiovascular: Negative for chest pain.  Gastrointestinal: Negative for abdominal pain, nausea and vomiting.  Genitourinary: Negative for flank pain.  Musculoskeletal: Negative for back pain.     Physical Exam Updated Vital Signs BP 119/81 (BP Location: Right Arm)   Pulse 95   Temp 99.6 F (37.6 C) (Oral)   Resp 18   Ht 5\' 7"  (1.702 m)   Wt 106.1 kg   SpO2 100%   BMI 36.65 kg/m   Physical Exam Vitals signs and nursing note reviewed.  Constitutional:      Appearance: He is well-developed. He is obese. He is ill-appearing.  HENT:     Head: Normocephalic and atraumatic.  Eyes:     General: No scleral icterus.    Pupils: Pupils are equal, round, and reactive to light.  Neck:     Musculoskeletal: Normal range of motion.  Cardiovascular:     Rate and Rhythm: Tachycardia present.     Heart sounds: Normal heart sounds.     Comments: No bilateral pitting edema. Pulmonary:     Effort: Pulmonary effort is normal.     Breath sounds: No wheezing.     Comments: Decreased  breath sounds throughout all lung fields. Chest:     Chest wall: No tenderness.  Abdominal:     General: Bowel sounds are normal. There is no distension.     Palpations: Abdomen is soft.     Tenderness: There is no abdominal tenderness.  Musculoskeletal:        General: No tenderness or deformity.  Skin:    General: Skin is warm and dry.  Neurological:     Mental Status: He is alert and oriented to person, place, and time.      ED Treatments / Results  Labs (all labs ordered are listed, but only abnormal results are displayed)  Labs Reviewed  CBC WITH DIFFERENTIAL/PLATELET - Abnormal; Notable for the following components:      Result Value   WBC 11.4 (*)    Hemoglobin 12.9 (*)    Neutro Abs 8.4 (*)    Monocytes Absolute 1.3 (*)    All other components within normal limits  COMPREHENSIVE METABOLIC PANEL - Abnormal; Notable for the following components:   CO2 20 (*)    Glucose, Bld 106 (*)    Creatinine, Ser 1.51 (*)    Calcium 8.8 (*)    GFR calc non Af Amer 52 (*)    GFR calc Af Amer 60 (*)    All other components within normal limits  SARS CORONAVIRUS 2 AG (30 MIN TAT)  SARS CORONAVIRUS 2 (TAT 6-24 HRS)    EKG None  Radiology Dg Chest Portable 1 View  Result Date: 02/01/2019 CLINICAL DATA:  Cough, congestion fever and body aches. EXAM: PORTABLE CHEST 1 VIEW COMPARISON:  01/06/2019 FINDINGS: The cardiomediastinal silhouette is unremarkable. There is no evidence of focal airspace disease, pulmonary edema, suspicious pulmonary nodule/mass, pleural effusion, or pneumothorax. No acute bony abnormalities are identified. IMPRESSION: No active disease. Electronically Signed   By: Margarette Canada M.D.   On: 02/01/2019 20:58    Procedures Procedures (including critical care time)  Medications Ordered in ED Medications  acetaminophen (TYLENOL) tablet 650 mg (650 mg Oral Given 02/01/19 2138)     Initial Impression / Assessment and Plan / ED Course  I have reviewed the triage  vital signs and the nursing notes.  Pertinent labs & imaging results that were available during my care of the patient were reviewed by me and considered in my medical decision making (see chart for details).    Patient with a past medical history of depression presents to the ED with complaints of cough, body aches, generalized weakness for the past 2 days.  Patient reports he had his temperature recorded at work which is 102, he has been taking DayQuil along with NyQuil to help with his symptoms without improvement.  He does endorse some shortness of breath, this is worse with ambulation, he does not have any prior history of heart failure, denies any orthopnea.  There is no swelling to bilateral lower extremities.  Patient does have 2 previous visits for the same symptoms of viral etiology, he has been Covid tested twice negative results.  During my evaluation lungs are diminished to auscultation, he is overall ill-appearing, he is hemodynamically stable, temp is 99.6, he is satting at 100% on room air.  CBC remarkable for some mild leukocytosis.  No electrolyte derangement, creatinine level is 1.51, however this is with his baseline.  LFTs are unremarkable.  He denies any abdominal pain.  Rapid Covid swab was negative.  X-ray of his chest showed no consolidation, pneumothorax, pleural effusion.  He denies any urinary symptoms on today's visit.  No hypoxia, tachycardia, recent surgery, hemoptysis, prior history of pulmonary embolism, lower suspicion for this.  Patient continue to treat his symptoms with over-the-counter medication,will also obtain second send out swab for his symptoms as they fit criteria for a covid 19 infection. He is advised monitor his oxygen while at home, he will be given a note for work in order to quarantine for the next 2 weeks.  Follow-up with PCP encouraged.  Patient stable for discharge.  Patient was able to ambulate without hypoxia.  Portions of this note were  generated with [employer]. Dictation errors may occur despite best  attempts at proofreading.  Final Clinical Impressions(s) / ED Diagnoses   Final diagnoses:  Viral URI with cough    ED Discharge Orders    None       Claude Manges, PA-C 02/01/19 2235    Rolan Bucco, MD 02/01/19 2250

## 2019-02-01 NOTE — Discharge Instructions (Addendum)
Your laboratory results are within normal limits today.  Your chest x-ray did not show any pneumonia.  You may continue to alternate tylenol or ibuprofen to help with your symptoms. Please turn to the ED if you experience any worsening symptoms, chest pain, shortness of breath.

## 2019-02-01 NOTE — ED Triage Notes (Signed)
Pt c/o flu like x 2 days-NAD-steady gait

## 2019-02-01 NOTE — ED Notes (Signed)
Pt. Ambulated in room. O2 sats stayed at 100% with good wave form. No complaints of dizziness or SOB.

## 2019-02-02 LAB — SARS CORONAVIRUS 2 (TAT 6-24 HRS): SARS Coronavirus 2: NEGATIVE

## 2019-03-11 DIAGNOSIS — G4733 Obstructive sleep apnea (adult) (pediatric): Secondary | ICD-10-CM | POA: Diagnosis not present

## 2019-06-17 ENCOUNTER — Emergency Department (HOSPITAL_BASED_OUTPATIENT_CLINIC_OR_DEPARTMENT_OTHER)
Admission: EM | Admit: 2019-06-17 | Discharge: 2019-06-17 | Disposition: A | Discharge status: Home / Self Care | Payer: BC Managed Care – PPO | Attending: Emergency Medicine | Admitting: Emergency Medicine

## 2019-06-17 ENCOUNTER — Other Ambulatory Visit: Payer: Self-pay

## 2019-06-17 ENCOUNTER — Encounter (HOSPITAL_BASED_OUTPATIENT_CLINIC_OR_DEPARTMENT_OTHER): Payer: Self-pay

## 2019-06-17 ENCOUNTER — Emergency Department (HOSPITAL_BASED_OUTPATIENT_CLINIC_OR_DEPARTMENT_OTHER): Payer: BC Managed Care – PPO

## 2019-06-17 DIAGNOSIS — Z20822 Contact with and (suspected) exposure to covid-19: Secondary | ICD-10-CM | POA: Insufficient documentation

## 2019-06-17 DIAGNOSIS — I10 Essential (primary) hypertension: Secondary | ICD-10-CM | POA: Insufficient documentation

## 2019-06-17 DIAGNOSIS — R05 Cough: Secondary | ICD-10-CM | POA: Diagnosis not present

## 2019-06-17 DIAGNOSIS — J069 Acute upper respiratory infection, unspecified: Secondary | ICD-10-CM | POA: Insufficient documentation

## 2019-06-17 DIAGNOSIS — F17210 Nicotine dependence, cigarettes, uncomplicated: Secondary | ICD-10-CM | POA: Insufficient documentation

## 2019-06-17 DIAGNOSIS — J302 Other seasonal allergic rhinitis: Secondary | ICD-10-CM

## 2019-06-17 DIAGNOSIS — E782 Mixed hyperlipidemia: Secondary | ICD-10-CM | POA: Diagnosis not present

## 2019-06-17 DIAGNOSIS — Z79899 Other long term (current) drug therapy: Secondary | ICD-10-CM | POA: Insufficient documentation

## 2019-06-17 LAB — RESPIRATORY PANEL BY RT PCR (FLU A&B, COVID)
Influenza A by PCR: NEGATIVE
Influenza B by PCR: NEGATIVE
SARS Coronavirus 2 by RT PCR: NEGATIVE

## 2019-06-17 LAB — SARS CORONAVIRUS 2 AG (30 MIN TAT): SARS Coronavirus 2 Ag: NEGATIVE

## 2019-06-17 MED ORDER — IBUPROFEN 600 MG PO TABS
600.0000 mg | ORAL_TABLET | Freq: Four times a day (QID) | ORAL | 0 refills | Status: DC | PRN
Start: 2019-06-17 — End: 2023-04-27

## 2019-06-17 MED ORDER — FLUTICASONE PROPIONATE 50 MCG/ACT NA SUSP
1.0000 | Freq: Every day | NASAL | 0 refills | Status: DC
Start: 2019-06-17 — End: 2019-08-09

## 2019-06-17 MED ORDER — KETOROLAC TROMETHAMINE 30 MG/ML IJ SOLN
30.0000 mg | Freq: Once | INTRAMUSCULAR | Status: AC
  Administered 2019-06-17: 30 mg via INTRAMUSCULAR
  Filled 2019-06-17: qty 1

## 2019-06-17 MED ORDER — AEROCHAMBER PLUS FLO-VU MEDIUM MISC
1.0000 | Freq: Once | Status: AC
  Administered 2019-06-17: 1
  Filled 2019-06-17: qty 1

## 2019-06-17 MED ORDER — DEXAMETHASONE SODIUM PHOSPHATE 10 MG/ML IJ SOLN
10.0000 mg | Freq: Once | INTRAMUSCULAR | Status: AC
  Administered 2019-06-17: 10 mg via INTRAMUSCULAR
  Filled 2019-06-17: qty 1

## 2019-06-17 MED ORDER — PREDNISONE 10 MG (21) PO TBPK
ORAL_TABLET | ORAL | 0 refills | Status: DC
Start: 2019-06-17 — End: 2019-08-22

## 2019-06-17 MED ORDER — ALBUTEROL SULFATE HFA 108 (90 BASE) MCG/ACT IN AERS
2.0000 | INHALATION_SPRAY | Freq: Once | RESPIRATORY_TRACT | Status: AC
  Administered 2019-06-17: 2 via RESPIRATORY_TRACT
  Filled 2019-06-17: qty 6.7

## 2019-06-17 NOTE — ED Triage Notes (Signed)
Pt states he has been having cough, headaches, sore throat, and body aches since yesterday. Pt reports history of allergies but states "I have thought it was hay fever, but I have never felt this bad with it. Pt also reports nausea, no vomiting or diarrhea.

## 2019-06-17 NOTE — ED Provider Notes (Signed)
[facility] EMERGENCY DEPARTMENT Provider Note   CSN: [id number] Arrival date & time: 06/17/19  0957     History Chief Complaint  Patient presents with  . Cough    James Downs is a 54 y.o. male.  Pt presents to the ED with cough, headache, sore throat, and body aches.  Pt has seasonal allergies, but this is worse than usual.  The pt has had some nausea, but no vomiting or diarrhea.  He has lost his appetite.  The pt did not take his meds this am as he usually takes them in the afternoon.  He does smoke.  Pt had a fever this am, but did not take any tylenol or ibuprofen.  No known covid contacts.  He has not had any vaccine.          Past Medical History:  Diagnosis Date  . Arthritis   . Depression   . Gout   . Hypercholesterolemia   . Hypertension    on meds  . Kidney damage   . Kidney stone   . Sleep apnea    uses CPAP    Patient Active Problem List   Diagnosis Date Noted  . 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 Weakness of left leg 11/04/2013  . Paresthesias 11/04/2013  . Lumbar radiculopathy 11/04/2013  . Degenerative disc disease, lumbar 11/04/2013  . ONYCHOMYCOSIS 12/23/2006  . HYPERLIPIDEMIA 12/23/2006  . ANXIETY DISORDER, GENERALIZED 12/23/2006  . TOBACCO ABUSE 12/23/2006  . SYNDROME, CARPAL TUNNEL 12/23/2006  . HYPERTENSION 12/23/2006  . NEPHROLITHIASIS, HX OF 12/23/2006    Past Surgical History:  Procedure Laterality Date  . FOOT SURGERY Right 1991  . HAND SURGERY Right 1985  . POSTERIOR LUMBAR FUSION 4 LEVEL N/A 11/29/2013   Procedure: Thoracic Eight, Thoracic Nine, Costotransversectomy   Thoracic Seven-Eight Thoracic Eight-Nine diskectomy;  Surgeon: Lisbeth Renshaw, MD;  Location: [facility];  Service: Neurosurgery;  Laterality: N/A;  Thoracic Eight, Thoracic Nine, Costotransversectomy   Thoracic Seven-Eight Thoracic Eight-Nine diskectomy       Family History  Problem Relation Age of  Onset  . Cancer Father        BRAIN/PROSTATE/SKIN  . Hypertension Father   . Cancer Paternal Grandfather   . Heart failure Maternal Grandmother   . Diabetes Maternal Grandmother   . Hypertension Maternal Grandmother   . Hypertension Maternal Grandfather     Social History   Tobacco Use  . Smoking status: Current Every Day Smoker    Packs/day: 1.00    Years: 30.00    Pack years: 30.00    Types: Cigarettes  . Smokeless tobacco: Former Engineer, water Use Topics  . Alcohol use: No  . Drug use: No    Home Medications Prior to Admission medications   Medication Sig Start Date End Date Taking? Authorizing Provider  azithromycin (ZITHROMAX Z-PAK) 250 MG tablet Take 1 tablet (250 mg total) by mouth daily. 08/17/18   Cathren Laine, MD  cefdinir (OMNICEF) 300 MG capsule Take 1 capsule (300 mg total) by mouth 2 (two) times daily. 08/16/18   Cathren Laine, MD  clindamycin (CLEOCIN) 300 MG capsule Take 1 capsule (300 mg total) by mouth 3 (three) times daily. X 7 days 06/13/18   Charlynne Pander, MD  cyclobenzaprine (FLEXERIL) 10 MG tablet TK 1 T PO TID 03/23/18   [provider]  Fexofenadine HCl (ALLEGRA ALLERGY PO) Take by mouth.    [provider]  fluticasone (FLONASE) 50 MCG/ACT  nasal spray Place 1 spray into both nostrils daily. 06/17/19   Isla Pence, MD  guaiFENesin (ROBITUSSIN) 100 MG/5ML liquid Take 5 mLs (100 mg total) by mouth every 6 (six) hours as needed for cough. 02/08/18   Caccavale, Sophia, PA-C  HYDROcodone-acetaminophen (NORCO/VICODIN) 5-325 MG tablet Take 1-2 tablets by mouth every 6 (six) hours as needed. 11/08/18   Davonna Belling, MD  ibuprofen (ADVIL) 600 MG tablet Take 1 tablet (600 mg total) by mouth every 6 (six) hours as needed. 06/17/19   Isla Pence, MD  ondansetron (ZOFRAN ODT) 4 MG disintegrating tablet Take 1 tablet (4 mg total) by mouth every 8 (eight) hours as needed for nausea or vomiting. 05/15/18   Providence Lanius A, PA-C   oxyCODONE (OXY IR/ROXICODONE) 5 MG immediate release tablet Take 1 tablet (5 mg total) by mouth every 4 (four) hours as needed for severe pain. 12/11/18   Hayden Rasmussen, MD  PARoxetine (PAXIL) 40 MG tablet TK 1 T PO QD IN THE MORNING 03/15/18   [provider]  predniSONE (STERAPRED UNI-PAK 21 TAB) 10 MG (21) TBPK tablet Take 6 tabs for 2 days, then 5 for 2 days, then 4 for 2 days, then 3 for 2 days, 2 for 2 days, then 1 for 2 days 06/17/19   Isla Pence, MD  telmisartan (MICARDIS) 20 MG tablet Take 1 tablet (20 mg total) by mouth daily. 04/26/17   Jacqlyn Larsen, PA-C  telmisartan-hydrochlorothiazide (MICARDIS HCT) 80-12.5 MG tablet TK 1 T PO QD 03/15/18   [provider]    Allergies    Patient has no known allergies.  Review of Systems   Review of Systems  Constitutional: Positive for fever.  Respiratory: Positive for cough and shortness of breath.   Gastrointestinal: Positive for nausea.  Musculoskeletal: Positive for arthralgias and myalgias.  All other systems reviewed and are negative.   Physical Exam Updated Vital Signs BP (!) 158/105 (BP Location: Right Arm)   Pulse 96   Temp 98 F (36.7 C) (Oral)   Resp 18   Ht 5\' 8"  (1.727 m)   Wt 117.9 kg   SpO2 99%   BMI 39.53 kg/m   Physical Exam Vitals and nursing note reviewed.  Constitutional:      Appearance: Normal appearance.  HENT:     Head: Normocephalic and atraumatic.     Right Ear: External ear normal.     Left Ear: External ear normal.     Nose: Nose normal.     Mouth/Throat:     Mouth: Mucous membranes are moist.     Pharynx: Oropharynx is clear.  Eyes:     Extraocular Movements: Extraocular movements intact.     Conjunctiva/sclera: Conjunctivae normal.     Pupils: Pupils are equal, round, and reactive to light.  Cardiovascular:     Rate and Rhythm: Normal rate and regular rhythm.     Pulses: Normal pulses.     Heart sounds: Normal heart sounds.  Pulmonary:     Effort: Pulmonary  effort is normal.     Breath sounds: Wheezing present.  Abdominal:     General: Abdomen is flat. Bowel sounds are normal.     Palpations: Abdomen is soft.  Musculoskeletal:        General: Normal range of motion.     Cervical back: Normal range of motion and neck supple.  Skin:    General: Skin is warm.     Capillary Refill: Capillary refill takes less than 2  seconds.  Neurological:     General: No focal deficit present.     Mental Status: He is alert and oriented to person, place, and time.  Psychiatric:        Mood and Affect: Mood normal.        Behavior: Behavior normal.        Thought Content: Thought content normal.        Judgment: Judgment normal.     ED Results / Procedures / Treatments   Labs (all labs ordered are listed, but only abnormal results are displayed) Labs Reviewed  SARS CORONAVIRUS 2 AG (30 MIN TAT)  RESPIRATORY PANEL BY RT PCR (FLU A&B, COVID)    EKG None  Radiology DG Chest Portable 1 View  Result Date: 06/17/2019 CLINICAL DATA:  Cough. EXAM: PORTABLE CHEST 1 VIEW COMPARISON:  02/01/2019 FINDINGS: The heart size and mediastinal contours are within normal limits. Both lungs are clear. The visualized skeletal structures are unremarkable. IMPRESSION: No active disease. Electronically Signed   By: Signa Kell M.D.   On: 06/17/2019 10:52    Procedures Procedures (including critical care time)  Medications Ordered in ED Medications  albuterol (VENTOLIN HFA) 108 (90 Base) MCG/ACT inhaler 2 puff (2 puffs Inhalation Given 06/17/19 1048)  AeroChamber Plus Flo-Vu Medium MISC 1 each (1 each Other Given 06/17/19 1048)  dexamethasone (DECADRON) injection 10 mg (10 mg Intramuscular Given 06/17/19 1031)  ketorolac (TORADOL) 30 MG/ML injection 30 mg (30 mg Intramuscular Given 06/17/19 1029)    ED Course  I have reviewed the triage vital signs and the nursing notes.  Pertinent labs & imaging results that were available during my care of the patient were  reviewed by me and considered in my medical decision making (see chart for details).    MDM Rules/Calculators/A&P                      Pt is feeling much better.  He is oxygenating well.  CXR negative.   His covid Ag negative.  PCR is pending.  Pt told to self-isolate until that comes back.  Pt is stable for d/c.  Return if worse.  F/u with pcp.  James Downs was evaluated in Emergency Department on 06/17/2019 for the symptoms described in the history of present illness. He was evaluated in the context of the global COVID-19 pandemic, which necessitated consideration that the patient might be at risk for infection with the SARS-CoV-2 virus that causes COVID-19. Institutional protocols and algorithms that pertain to the evaluation of patients at risk for COVID-19 are in a state of rapid change based on information released by regulatory bodies including the CDC and federal and state organizations. These policies and algorithms were followed during the patient's care in the ED. Final Clinical Impression(s) / ED Diagnoses Final diagnoses:  Viral URI with cough  Person under investigation for COVID-19  Seasonal allergies    Rx / DC Orders ED Discharge Orders         Ordered    predniSONE (STERAPRED UNI-PAK 21 TAB) 10 MG (21) TBPK tablet     06/17/19 1153    fluticasone (FLONASE) 50 MCG/ACT nasal spray  Daily     06/17/19 1153    ibuprofen (ADVIL) 600 MG tablet  Every 6 hours PRN     06/17/19 1153           Jacalyn Lefevre, MD 06/17/19 1157

## 2019-06-20 ENCOUNTER — Encounter (HOSPITAL_BASED_OUTPATIENT_CLINIC_OR_DEPARTMENT_OTHER): Payer: Self-pay

## 2019-06-20 ENCOUNTER — Emergency Department (HOSPITAL_BASED_OUTPATIENT_CLINIC_OR_DEPARTMENT_OTHER)
Admission: EM | Admit: 2019-06-20 | Discharge: 2019-06-20 | Disposition: A | Discharge status: Home / Self Care | Payer: BC Managed Care – PPO | Attending: Emergency Medicine | Admitting: Emergency Medicine

## 2019-06-20 ENCOUNTER — Other Ambulatory Visit: Payer: Self-pay

## 2019-06-20 DIAGNOSIS — I10 Essential (primary) hypertension: Secondary | ICD-10-CM | POA: Insufficient documentation

## 2019-06-20 DIAGNOSIS — J111 Influenza due to unidentified influenza virus with other respiratory manifestations: Secondary | ICD-10-CM | POA: Diagnosis not present

## 2019-06-20 DIAGNOSIS — Z79899 Other long term (current) drug therapy: Secondary | ICD-10-CM | POA: Diagnosis not present

## 2019-06-20 DIAGNOSIS — R05 Cough: Secondary | ICD-10-CM | POA: Diagnosis not present

## 2019-06-20 DIAGNOSIS — M7918 Myalgia, other site: Secondary | ICD-10-CM | POA: Diagnosis not present

## 2019-06-20 DIAGNOSIS — F17210 Nicotine dependence, cigarettes, uncomplicated: Secondary | ICD-10-CM | POA: Insufficient documentation

## 2019-06-20 DIAGNOSIS — Z20822 Contact with and (suspected) exposure to covid-19: Secondary | ICD-10-CM | POA: Diagnosis not present

## 2019-06-20 DIAGNOSIS — R519 Headache, unspecified: Secondary | ICD-10-CM | POA: Insufficient documentation

## 2019-06-20 DIAGNOSIS — R030 Elevated blood-pressure reading, without diagnosis of hypertension: Secondary | ICD-10-CM

## 2019-06-20 LAB — RESPIRATORY PANEL BY RT PCR (FLU A&B, COVID)
Influenza A by PCR: NEGATIVE
Influenza B by PCR: NEGATIVE
SARS Coronavirus 2 by RT PCR: NEGATIVE

## 2019-06-20 MED ORDER — ACETAMINOPHEN 325 MG PO TABS
650.0000 mg | ORAL_TABLET | Freq: Once | ORAL | Status: AC
  Administered 2019-06-20: 650 mg via ORAL
  Filled 2019-06-20: qty 2

## 2019-06-20 NOTE — ED Notes (Signed)
Pt states needs another work note

## 2019-06-20 NOTE — ED Triage Notes (Signed)
C/o flu like sx x 5 days-seen here 4/23 with neg covid test-states he feels worse with HA-NAD-steady gait

## 2019-06-20 NOTE — Discharge Instructions (Signed)
Person Under Monitoring Name: James Downs  Location: [address] [place] [address]   Infection Prevention Recommendations for Individuals Confirmed to have, or Being Evaluated for, 2019 Novel Coronavirus (COVID-19) Infection Who Receive Care at Home  Individuals who are confirmed to have, or are being evaluated for, COVID-19 should follow the prevention steps below until a healthcare provider or local or state health department says they can return to normal activities.  Stay home except to get medical care You should restrict activities outside your home, except for getting medical care. Do not go to work, school, or public areas, and do not use public transportation or taxis.  Call ahead before visiting your doctor Before your medical appointment, call the healthcare provider and tell them that you have, or are being evaluated for, COVID-19 infection. This will help the healthcare provider's office take steps to keep other people from getting infected. Ask your healthcare provider to call the local or state health department.  Monitor your symptoms Seek prompt medical attention if your illness is worsening (e.g., difficulty breathing). Before going to your medical appointment, call the healthcare provider and tell them that you have, or are being evaluated for, COVID-19 infection. Ask your healthcare provider to call the local or state health department.  Wear a facemask You should wear a facemask that covers your nose and mouth when you are in the same room with other people and when you visit a healthcare provider. People who live with or visit you should also wear a facemask while they are in the same room with you.  Separate yourself from other people in your home As much as possible, you should stay in a different room from other people in your home. Also, you should use a separate bathroom, if available.  Avoid sharing household items You should not  share dishes, drinking glasses, cups, eating utensils, towels, bedding, or other items with other people in your home. After using these items, you should wash them thoroughly with soap and water.  Cover your coughs and sneezes Cover your mouth and nose with a tissue when you cough or sneeze, or you can cough or sneeze into your sleeve. Throw used tissues in a lined trash can, and immediately wash your hands with soap and water for at least 20 seconds or use an alcohol-based hand rub.  Wash your [employer] your hands often and thoroughly with soap and water for at least 20 seconds. You can use an alcohol-based hand sanitizer if soap and water are not available and if your hands are not visibly dirty. Avoid touching your eyes, nose, and mouth with unwashed hands.   Prevention Steps for Caregivers and Household Members of Individuals Confirmed to have, or Being Evaluated for, COVID-19 Infection Being Cared for in the Home  If you live with, or provide care at home for, a person confirmed to have, or being evaluated for, COVID-19 infection please follow these guidelines to prevent infection:  Follow healthcare provider's instructions Make sure that you understand and can help the patient follow any healthcare provider instructions for all care.  Provide for the patient's basic needs You should help the patient with basic needs in the home and provide support for getting groceries, prescriptions, and other personal needs.  Monitor the patient's symptoms If they are getting sicker, call his or her medical provider and tell them that the patient has, or is being evaluated for, COVID-19 infection. This will help the healthcare provider's office  take steps to keep other people from getting infected. Ask the healthcare provider to call the local or state health department.  Limit the number of people who have contact with the patient If possible, have only one caregiver for the  patient. Other household members should stay in another home or place of residence. If this is not possible, they should stay in another room, or be separated from the patient as much as possible. Use a separate bathroom, if available. Restrict visitors who do not have an essential need to be in the home.  Keep older adults, very young children, and other sick people away from the patient Keep older adults, very young children, and those who have compromised immune systems or chronic health conditions away from the patient. This includes people with chronic heart, lung, or kidney conditions, diabetes, and cancer.  Ensure good ventilation Make sure that shared spaces in the home have good air flow, such as from an air conditioner or an opened window, weather permitting.  Wash your hands often Wash your hands often and thoroughly with soap and water for at least 20 seconds. You can use an alcohol based hand sanitizer if soap and water are not available and if your hands are not visibly dirty. Avoid touching your eyes, nose, and mouth with unwashed hands. Use disposable paper towels to dry your hands. If not available, use dedicated cloth towels and replace them when they become wet.  Wear a facemask and gloves Wear a disposable facemask at all times in the room and gloves when you touch or have contact with the patient's blood, body fluids, and/or secretions or excretions, such as sweat, saliva, sputum, nasal mucus, vomit, urine, or feces.  Ensure the mask fits over your nose and mouth tightly, and do not touch it during use. Throw out disposable facemasks and gloves after using them. Do not reuse. Wash your hands immediately after removing your facemask and gloves. If your personal clothing becomes contaminated, carefully remove clothing and launder. Wash your hands after handling contaminated clothing. Place all used disposable facemasks, gloves, and other waste in a lined container before  disposing them with other household waste. Remove gloves and wash your hands immediately after handling these items.  Do not share dishes, glasses, or other household items with the patient Avoid sharing household items. You should not share dishes, drinking glasses, cups, eating utensils, towels, bedding, or other items with a patient who is confirmed to have, or being evaluated for, COVID-19 infection. After the person uses these items, you should wash them thoroughly with soap and water.  Wash laundry thoroughly Immediately remove and wash clothes or bedding that have blood, body fluids, and/or secretions or excretions, such as sweat, saliva, sputum, nasal mucus, vomit, urine, or feces, on them. Wear gloves when handling laundry from the patient. Read and follow directions on labels of laundry or clothing items and detergent. In general, wash and dry with the warmest temperatures recommended on the label.  Clean all areas the individual has used often Clean all touchable surfaces, such as counters, tabletops, doorknobs, bathroom fixtures, toilets, phones, keyboards, tablets, and bedside tables, every day. Also, clean any surfaces that may have blood, body fluids, and/or secretions or excretions on them. Wear gloves when cleaning surfaces the patient has come in contact with. Use a diluted bleach solution (e.g., dilute bleach with 1 part bleach and 10 parts water) or a household disinfectant with a label that says EPA-registered for coronaviruses. To make a bleach  solution at home, add 1 tablespoon of bleach to 1 quart (4 cups) of water. For a larger supply, add  cup of bleach to 1 gallon (16 cups) of water. Read labels of cleaning products and follow recommendations provided on product labels. Labels contain instructions for safe and effective use of the cleaning product including precautions you should take when applying the product, such as wearing gloves or eye protection and making sure you  have good ventilation during use of the product. Remove gloves and wash hands immediately after cleaning.  Monitor yourself for signs and symptoms of illness Caregivers and household members are considered close contacts, should monitor their health, and will be asked to limit movement outside of the home to the extent possible. Follow the monitoring steps for close contacts listed on the symptom monitoring form.   ? If you have additional questions, contact your local health department or call the epidemiologist on call at [phone] (available 24/7). ? This guidance is subject to change. For the most up-to-date guidance from [facility], please refer to their website: [email or web address]

## 2019-06-20 NOTE — ED Provider Notes (Signed)
[facility] EMERGENCY DEPARTMENT Provider Note   CSN: [id number] Arrival date & time: 06/20/19  1352     History Chief Complaint  Patient presents with  . Cough    James Downs is a 54 y.o. male who presents to the emergency department chief complaint of flulike symptoms.  He was seen for the same 5 days ago.  He has symptoms of cough, body aches, headache.  He was tested for coronavirus x2 and given a chest x-ray which was negative.  Patient is a daily smoker and has a history of lumbar radiculopathy, hypertension, hyperlipidemia and anxiety.  He has been taking the medications that were prescribed including ibuprofen and a glucocorticoids.  He states that he is feeling no better.  His symptoms have not changed.  They have not worsened.  He denies nausea, vomiting, abdominal pain, urinary symptoms.  He has not taken Tylenol but has taken ibuprofen.  He does not have a documented fever at home.  Patient is notably hypertensive states that he did take his medications today  HPI     Past Medical History:  Diagnosis Date  . Arthritis   . Depression   . Gout   . Hypercholesterolemia   . Hypertension    on meds  . Kidney damage   . Kidney stone   . Sleep apnea    uses CPAP    Patient Active Problem List   Diagnosis Date Noted  . 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 Weakness of left leg 11/04/2013  . Paresthesias 11/04/2013  . Lumbar radiculopathy 11/04/2013  . Degenerative disc disease, lumbar 11/04/2013  . ONYCHOMYCOSIS 12/23/2006  . HYPERLIPIDEMIA 12/23/2006  . ANXIETY DISORDER, GENERALIZED 12/23/2006  . TOBACCO ABUSE 12/23/2006  . SYNDROME, CARPAL TUNNEL 12/23/2006  . HYPERTENSION 12/23/2006  . NEPHROLITHIASIS, HX OF 12/23/2006    Past Surgical History:  Procedure Laterality Date  . FOOT SURGERY Right 1991  . HAND SURGERY Right 1985  . POSTERIOR LUMBAR FUSION 4 LEVEL N/A 11/29/2013   Procedure: Thoracic Eight,  Thoracic Nine, Costotransversectomy   Thoracic Seven-Eight Thoracic Eight-Nine diskectomy;  Surgeon: Consuella Lose, MD;  Location: [facility];  Service: Neurosurgery;  Laterality: N/A;  Thoracic Eight, Thoracic Nine, Costotransversectomy   Thoracic Seven-Eight Thoracic Eight-Nine diskectomy       Family History  Problem Relation Age of Onset  . Cancer Father        BRAIN/PROSTATE/SKIN  . Hypertension Father   . Cancer Paternal Grandfather   . Heart failure Maternal Grandmother   . Diabetes Maternal Grandmother   . Hypertension Maternal Grandmother   . Hypertension Maternal Grandfather     Social History   Tobacco Use  . Smoking status: Current Every Day Smoker    Packs/day: 1.00    Years: 30.00    Pack years: 30.00    Types: Cigarettes  . Smokeless tobacco: Former Network engineer Use Topics  . Alcohol use: No  . Drug use: No    Home Medications Prior to Admission medications   Medication Sig Start Date End Date Taking? Authorizing Provider  cyclobenzaprine (FLEXERIL) 10 MG tablet TK 1 T PO TID 03/23/18   [provider]  Fexofenadine HCl (ALLEGRA ALLERGY PO) Take by mouth.    [provider]  fluticasone (FLONASE) 50 MCG/ACT nasal spray Place 1 spray into both nostrils daily. 06/17/19   Isla Pence, MD  guaiFENesin (ROBITUSSIN) 100 MG/5ML liquid Take 5 mLs (100 mg total) by mouth every  6 (six) hours as needed for cough. 02/08/18   Caccavale, Sophia, PA-C  HYDROcodone-acetaminophen (NORCO/VICODIN) 5-325 MG tablet Take 1-2 tablets by mouth every 6 (six) hours as needed. 11/08/18   Benjiman Core, MD  ibuprofen (ADVIL) 600 MG tablet Take 1 tablet (600 mg total) by mouth every 6 (six) hours as needed. 06/17/19   Jacalyn Lefevre, MD  ondansetron (ZOFRAN ODT) 4 MG disintegrating tablet Take 1 tablet (4 mg total) by mouth every 8 (eight) hours as needed for nausea or vomiting. 05/15/18   Graciella Freer A, PA-C  oxyCODONE (OXY IR/ROXICODONE) 5 MG immediate  release tablet Take 1 tablet (5 mg total) by mouth every 4 (four) hours as needed for severe pain. 12/11/18   Terrilee Files, MD  PARoxetine (PAXIL) 40 MG tablet TK 1 T PO QD IN THE MORNING 03/15/18   [provider]  predniSONE (STERAPRED UNI-PAK 21 TAB) 10 MG (21) TBPK tablet Take 6 tabs for 2 days, then 5 for 2 days, then 4 for 2 days, then 3 for 2 days, 2 for 2 days, then 1 for 2 days 06/17/19   Jacalyn Lefevre, MD  telmisartan (MICARDIS) 20 MG tablet Take 1 tablet (20 mg total) by mouth daily. 04/26/17   Dartha Lodge, PA-C  telmisartan-hydrochlorothiazide (MICARDIS HCT) 80-12.5 MG tablet TK 1 T PO QD 03/15/18   [provider]    Allergies    Patient has no known allergies.  Review of Systems   Review of Systems Ten systems reviewed and are negative for acute change, except as noted in the HPI.   Physical Exam Updated Vital Signs BP (!) 186/104 (BP Location: Left Arm)   Pulse 100   Temp 98.2 F (36.8 C) (Oral)   Resp 18   Ht 5\' 8"  (1.727 m)   Wt 104.3 kg   SpO2 98%   BMI 34.97 kg/m   Physical Exam Vitals and nursing note reviewed.  Constitutional:      General: He is not in acute distress.    Appearance: He is well-developed. He is not diaphoretic.  HENT:     Head: Normocephalic and atraumatic.  Eyes:     General: No scleral icterus.    Conjunctiva/sclera: Conjunctivae normal.  Cardiovascular:     Rate and Rhythm: Normal rate and regular rhythm.     Heart sounds: Normal heart sounds.  Pulmonary:     Effort: Pulmonary effort is normal. No respiratory distress.     Breath sounds: Normal breath sounds. No wheezing or rhonchi.  Abdominal:     Palpations: Abdomen is soft.     Tenderness: There is no abdominal tenderness.  Musculoskeletal:     Cervical back: Normal range of motion and neck supple.  Skin:    General: Skin is warm and dry.  Neurological:     Mental Status: He is alert.  Psychiatric:        Behavior: Behavior normal.     ED  Results / Procedures / Treatments   Labs (all labs ordered are listed, but only abnormal results are displayed) Labs Reviewed  RESPIRATORY PANEL BY RT PCR (FLU A&B, COVID)    EKG None  Radiology No results found.  Procedures Procedures (including critical care time)  Medications Ordered in ED Medications  acetaminophen (TYLENOL) tablet 650 mg (650 mg Oral Given 06/20/19 1656)    ED Course  I have reviewed the triage vital signs and the nursing notes.  Pertinent labs & imaging results that were available during  my care of the patient were reviewed by me and considered in my medical decision making (see chart for details).    MDM Rules/Calculators/A&P                      Patient here stating he feels no better.  I think that it is prudent that we repeat a Covid test today as he had a negative and it may have been too early to test positive.  He is again told to remain on home isolation precautions until test results have resolved.  Explained to patient that the course of URI or other viral illnesses that he is not going to feel a lot better despite any medications we give with his hypertension he might try Coricidin over-the-counter for URI symptoms and he may alternate between Tylenol and ibuprofen.  He should continue to take his blood pressure medications.  He does not have any shortness of breath and no worsening in his symptoms I do not feel that we need to repeat a chest x-ray.  I hear no wheezing.  He has normal oxygen saturations and appears appropriate for discharge at this time.  Follow-up with PCP. Final Clinical Impression(s) / ED Diagnoses Final diagnoses:  Influenza-like illness  Elevated blood pressure reading    Rx / DC Orders ED Discharge Orders    None       Arthor Captain, PA-C 06/20/19 1703    Terrilee Files, MD 06/21/19 1049

## 2019-08-09 ENCOUNTER — Other Ambulatory Visit: Payer: Self-pay

## 2019-08-09 ENCOUNTER — Emergency Department (HOSPITAL_BASED_OUTPATIENT_CLINIC_OR_DEPARTMENT_OTHER)
Admission: EM | Admit: 2019-08-09 | Discharge: 2019-08-09 | Disposition: A | Discharge status: Home / Self Care | Payer: BC Managed Care – PPO | Attending: Emergency Medicine | Admitting: Emergency Medicine

## 2019-08-09 ENCOUNTER — Encounter (HOSPITAL_BASED_OUTPATIENT_CLINIC_OR_DEPARTMENT_OTHER): Payer: Self-pay | Admitting: Emergency Medicine

## 2019-08-09 ENCOUNTER — Emergency Department (HOSPITAL_BASED_OUTPATIENT_CLINIC_OR_DEPARTMENT_OTHER): Payer: BC Managed Care – PPO

## 2019-08-09 DIAGNOSIS — F17210 Nicotine dependence, cigarettes, uncomplicated: Secondary | ICD-10-CM | POA: Insufficient documentation

## 2019-08-09 DIAGNOSIS — I10 Essential (primary) hypertension: Secondary | ICD-10-CM | POA: Insufficient documentation

## 2019-08-09 DIAGNOSIS — K625 Hemorrhage of anus and rectum: Secondary | ICD-10-CM | POA: Diagnosis not present

## 2019-08-09 DIAGNOSIS — N200 Calculus of kidney: Secondary | ICD-10-CM | POA: Diagnosis not present

## 2019-08-09 DIAGNOSIS — R1030 Lower abdominal pain, unspecified: Secondary | ICD-10-CM | POA: Diagnosis not present

## 2019-08-09 DIAGNOSIS — K644 Residual hemorrhoidal skin tags: Secondary | ICD-10-CM | POA: Diagnosis not present

## 2019-08-09 DIAGNOSIS — N134 Hydroureter: Secondary | ICD-10-CM | POA: Diagnosis not present

## 2019-08-09 HISTORY — DX: Obesity, unspecified: E66.9

## 2019-08-09 LAB — CBC WITH DIFFERENTIAL/PLATELET
Abs Immature Granulocytes: 0 10*3/uL (ref 0.00–0.07)
Basophils Absolute: 0.1 10*3/uL (ref 0.0–0.1)
Basophils Relative: 1 %
Eosinophils Absolute: 0.3 10*3/uL (ref 0.0–0.5)
Eosinophils Relative: 4 %
HCT: 43.4 % (ref 39.0–52.0)
Hemoglobin: 14 g/dL (ref 13.0–17.0)
Lymphocytes Relative: 24 %
Lymphs Abs: 1.7 10*3/uL (ref 0.7–4.0)
MCH: 29.2 pg (ref 26.0–34.0)
MCHC: 32.3 g/dL (ref 30.0–36.0)
MCV: 90.4 fL (ref 80.0–100.0)
Monocytes Absolute: 0.4 10*3/uL (ref 0.1–1.0)
Monocytes Relative: 6 %
Neutro Abs: 4.7 10*3/uL (ref 1.7–7.7)
Neutrophils Relative %: 65 %
Platelets: 177 10*3/uL (ref 150–400)
RBC: 4.8 MIL/uL (ref 4.22–5.81)
RDW: 13.8 % (ref 11.5–15.5)
WBC: 7.2 10*3/uL (ref 4.0–10.5)
nRBC: 0 % (ref 0.0–0.2)

## 2019-08-09 LAB — URINALYSIS, ROUTINE W REFLEX MICROSCOPIC
Bilirubin Urine: NEGATIVE
Glucose, UA: NEGATIVE mg/dL
Hgb urine dipstick: NEGATIVE
Ketones, ur: NEGATIVE mg/dL
Leukocytes,Ua: NEGATIVE
Nitrite: NEGATIVE
Protein, ur: NEGATIVE mg/dL
Specific Gravity, Urine: 1.01 (ref 1.005–1.030)
pH: 5.5 (ref 5.0–8.0)

## 2019-08-09 LAB — COMPREHENSIVE METABOLIC PANEL
ALT: 19 U/L (ref 0–44)
AST: 21 U/L (ref 15–41)
Albumin: 3.6 g/dL (ref 3.5–5.0)
Alkaline Phosphatase: 70 U/L (ref 38–126)
Anion gap: 6 (ref 5–15)
BUN: 11 mg/dL (ref 6–20)
CO2: 22 mmol/L (ref 22–32)
Calcium: 8.9 mg/dL (ref 8.9–10.3)
Chloride: 113 mmol/L — ABNORMAL HIGH (ref 98–111)
Creatinine, Ser: 1.51 mg/dL — ABNORMAL HIGH (ref 0.61–1.24)
GFR calc Af Amer: 59 mL/min — ABNORMAL LOW (ref >60)
GFR calc non Af Amer: 51 mL/min — ABNORMAL LOW (ref >60)
Glucose, Bld: 111 mg/dL — ABNORMAL HIGH (ref 70–99)
Potassium: 4.7 mmol/L (ref 3.5–5.1)
Sodium: 141 mmol/L (ref 135–145)
Total Bilirubin: 0.5 mg/dL (ref 0.3–1.2)
Total Protein: 6.6 g/dL (ref 6.5–8.1)

## 2019-08-09 LAB — OCCULT BLOOD X 1 CARD TO LAB, STOOL: Fecal Occult Bld: POSITIVE — AB

## 2019-08-09 LAB — LIPASE, BLOOD: Lipase: 64 U/L — ABNORMAL HIGH (ref 11–51)

## 2019-08-09 MED ORDER — IOHEXOL 300 MG/ML  SOLN
100.0000 mL | Freq: Once | INTRAMUSCULAR | Status: AC | PRN
  Administered 2019-08-09: 100 mL via INTRAVENOUS

## 2019-08-09 MED ORDER — HYDROCORTISONE ACETATE 25 MG RE SUPP
25.0000 mg | Freq: Two times a day (BID) | RECTAL | 0 refills | Status: DC
Start: 2019-08-09 — End: 2023-04-27

## 2019-08-09 MED ORDER — ONDANSETRON 4 MG PO TBDP
4.0000 mg | ORAL_TABLET | Freq: Three times a day (TID) | ORAL | 0 refills | Status: DC | PRN
Start: 2019-08-09 — End: 2023-05-20

## 2019-08-09 NOTE — ED Notes (Signed)
Patient transported to CT 

## 2019-08-09 NOTE — ED Provider Notes (Signed)
[place] EMERGENCY DEPARTMENT Provider Note   CSN: [id number] Arrival date & time: 08/09/19  1243     History Chief Complaint  Patient presents with  . Rectal Bleeding    James Downs is a 54 y.o. male with history of hemorrhoids who presents with rectal bleeding and abdominal pain.  Patient states that for the past several days he has been having bloody bowel movements.  He estimates has been 2-3 times a day.  The blood is bright red and sometimes pink mixed with stool.  The stool is loose he denies constipation or diarrhea.  He is also been having lower abdominal pain and lower back pain with associated nausea.  Nothing makes it better or worse.  He denies fever, chills, chest pain, shortness of breath, lightheadedness syncope.  He is not on blood thinners.  He does not take NSAIDs.  He has seen Dr. Benson Norway in the past and had a colonoscopy which was normal.  HPI     Past Medical History:  Diagnosis Date  . Arthritis   . Depression   . Gout   . Hypercholesterolemia   . Hypertension    on meds  . Kidney damage   . Kidney stone   . Obesity   . Sleep apnea    uses CPAP    Patient Active Problem List   Diagnosis Date Noted  . 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 Weakness of left leg 11/04/2013  . Paresthesias 11/04/2013  . Lumbar radiculopathy 11/04/2013  . Degenerative disc disease, lumbar 11/04/2013  . ONYCHOMYCOSIS 12/23/2006  . HYPERLIPIDEMIA 12/23/2006  . ANXIETY DISORDER, GENERALIZED 12/23/2006  . TOBACCO ABUSE 12/23/2006  . SYNDROME, CARPAL TUNNEL 12/23/2006  . HYPERTENSION 12/23/2006  . NEPHROLITHIASIS, HX OF 12/23/2006    Past Surgical History:  Procedure Laterality Date  . FOOT SURGERY Right 1991  . HAND SURGERY Right 1985  . POSTERIOR LUMBAR FUSION 4 LEVEL N/A 11/29/2013   Procedure: Thoracic Eight, Thoracic Nine, Costotransversectomy   Thoracic Seven-Eight Thoracic Eight-Nine diskectomy;  Surgeon:  Consuella Lose, MD;  Location: [facility];  Service: Neurosurgery;  Laterality: N/A;  Thoracic Eight, Thoracic Nine, Costotransversectomy   Thoracic Seven-Eight Thoracic Eight-Nine diskectomy       Family History  Problem Relation Age of Onset  . Cancer Father        BRAIN/PROSTATE/SKIN  . Hypertension Father   . Cancer Paternal Grandfather   . Heart failure Maternal Grandmother   . Diabetes Maternal Grandmother   . Hypertension Maternal Grandmother   . Hypertension Maternal Grandfather     Social History   Tobacco Use  . Smoking status: Current Every Day Smoker    Packs/day: 1.00    Years: 30.00    Pack years: 30.00    Types: Cigarettes  . Smokeless tobacco: Former Network engineer  . Vaping Use: Never used  Substance Use Topics  . Alcohol use: No  . Drug use: No    Home Medications Prior to Admission medications   Medication Sig Start Date End Date Taking? Authorizing Provider  allopurinol (ZYLOPRIM) 100 MG tablet Take 50 mg by mouth daily. 05/04/19   [provider]  ibuprofen (ADVIL) 600 MG tablet Take 1 tablet (600 mg total) by mouth every 6 (six) hours as needed. 06/17/19   Isla Pence, MD  MITIGARE 0.6 MG CAPS Take 1 capsule by mouth daily. 05/04/19   [provider]  ondansetron (ZOFRAN ODT) 4 MG disintegrating tablet  Take 1 tablet (4 mg total) by mouth every 8 (eight) hours as needed for nausea or vomiting. 05/15/18   Graciella Freer A, PA-C  oxyCODONE (OXY IR/ROXICODONE) 5 MG immediate release tablet Take 1 tablet (5 mg total) by mouth every 4 (four) hours as needed for severe pain. 12/11/18   Terrilee Files, MD  PARoxetine (PAXIL) 40 MG tablet TK 1 T PO QD IN THE MORNING 03/15/18   [provider]  predniSONE (STERAPRED UNI-PAK 21 TAB) 10 MG (21) TBPK tablet Take 6 tabs for 2 days, then 5 for 2 days, then 4 for 2 days, then 3 for 2 days, 2 for 2 days, then 1 for 2 days 06/17/19   Jacalyn Lefevre, MD  telmisartan (MICARDIS) 20 MG  tablet Take 1 tablet (20 mg total) by mouth daily. 04/26/17   Dartha Lodge, PA-C    Allergies    Patient has no known allergies.  Review of Systems   Review of Systems  Constitutional: Negative for chills and fever.  Respiratory: Negative for shortness of breath.   Cardiovascular: Negative for chest pain.  Gastrointestinal: Positive for abdominal pain and blood in stool. Negative for constipation, diarrhea, nausea and vomiting.  Musculoskeletal: Positive for back pain.  All other systems reviewed and are negative.   Physical Exam Updated Vital Signs BP (!) 154/100 (BP Location: Right Arm) Comment: Pt states he has not taken his afternoon BP medication  Pulse 78   Temp 98.1 F (36.7 C) (Oral)   Resp 16   Ht 5\' 8"  (1.727 m)   Wt 105.1 kg   SpO2 97%   BMI 35.23 kg/m   Physical Exam Vitals and nursing note reviewed.  Constitutional:      General: He is not in acute distress.    Appearance: He is well-developed. He is obese. He is not ill-appearing.  HENT:     Head: Normocephalic and atraumatic.  Eyes:     General: No scleral icterus.       Right eye: No discharge.        Left eye: No discharge.     Conjunctiva/sclera: Conjunctivae normal.     Pupils: Pupils are equal, round, and reactive to light.  Cardiovascular:     Rate and Rhythm: Normal rate and regular rhythm.  Pulmonary:     Effort: Pulmonary effort is normal. No respiratory distress.     Breath sounds: Normal breath sounds.  Abdominal:     General: There is no distension.     Palpations: Abdomen is soft.     Tenderness: There is abdominal tenderness (diffuse lower abdominal tenderness). There is no right CVA tenderness or left CVA tenderness.  Genitourinary:    Comments: Rectal: Circumferential external hemorrhoids. Several skin tags around the anus. There is a scant amount of red blood with digitial exam but no tenderness.  Musculoskeletal:     Cervical back: Normal range of motion.     Comments: Mild  lumbar spinal tenderness  Skin:    General: Skin is warm and dry.  Neurological:     Mental Status: He is alert and oriented to person, place, and time.  Psychiatric:        Behavior: Behavior normal.     ED Results / Procedures / Treatments   Labs (all labs ordered are listed, but only abnormal results are displayed) Labs Reviewed  COMPREHENSIVE METABOLIC PANEL - Abnormal; Notable for the following components:      Result Value   Chloride 113 (*)  Glucose, Bld 111 (*)    Creatinine, Ser 1.51 (*)    GFR calc non Af Amer 51 (*)    GFR calc Af Amer 59 (*)    All other components within normal limits  LIPASE, BLOOD - Abnormal; Notable for the following components:   Lipase 64 (*)    All other components within normal limits  OCCULT BLOOD X 1 CARD TO LAB, STOOL - Abnormal; Notable for the following components:   Fecal Occult Bld POSITIVE (*)    All other components within normal limits  CBC WITH DIFFERENTIAL/PLATELET  URINALYSIS, ROUTINE W REFLEX MICROSCOPIC    EKG None  Radiology CT Abdomen Pelvis W Contrast  Result Date: 08/09/2019 CLINICAL DATA:  Rectal bleeding for 4 days abdominal and back pain EXAM: CT ABDOMEN AND PELVIS WITH CONTRAST TECHNIQUE: Multidetector CT imaging of the abdomen and pelvis was performed using the standard protocol following bolus administration of intravenous contrast. CONTRAST:  OMNIPAQUE IOHEXOL 300 MG/ML  SOLN COMPARISON:  CT abdomen and pelvis 06/13/2018 FINDINGS: Lower chest: Dependent atelectatic changes in the lung bases. Additional bandlike areas of opacity may reflect further atelectasis or scarring. Normal heart size. No pericardial effusion. Coronary artery calcifications are present. Hepatobiliary: Nodular hepatic surface contour. No focal concerning liver lesions. Hepatic attenuation remains within normal limits. Normal gallbladder. No visible calcified gallstones. No biliary ductal dilatation. Pancreas: Partial fatty replacement of  the pancreas. No pancreatic inflammation, ductal dilatation or discernible lesions. Spleen: Normal splenic size. No concerning lesions. Small accessory splenule superiorly. Adrenals/Urinary Tract: Normal adrenal glands. Kidneys enhance uniformly and excrete symmetrically. Few subcentimeter hypoattenuating foci in both kidneys too small to fully characterize on CT imaging but statistically likely benign. Bilateral nonobstructing nephrolithiasis. No obstructive urolithiasis. No hydronephrosis. Mild bilateral symmetric perinephric stranding, a nonspecific finding. Circumferential bladder wall thickening, possibly related to underdistention. Stomach/Bowel: Mild thickening versus underdistention of the distal thoracic esophagus, correlate for symptoms. Stomach and duodenum are unremarkable. No small bowel thickening or dilatation. A normal appendix is visualized. No colonic dilatation or wall thickening. Vascular/Lymphatic: There is faint periaortic hazy stranding within the retroperitoneum with clustered and numerous lymph nodes including several borderline enlarged nodes for instance a 14 mm left periaortic node (2/42) and a smaller 10 mm anterior periaortic node (2/36). No other suspicious adenopathy. Atherosclerotic calcifications throughout the abdominal aorta and branch vessels. No aneurysm or ectasia. Reproductive: The prostate and seminal vesicles are unremarkable. Other: No abdominopelvic free fluid or free gas. No bowel containing hernias. Musculoskeletal: No acute osseous abnormality or suspicious osseous lesion. Multilevel degenerative changes are present in the imaged portions of the spine. Mild retrolisthesis L2 on 3 is unchanged from prior. Multiple bone islands in the proximal femora and ilia. IMPRESSION: 1. Numerous and borderline enlarged retroperitoneal lymph nodes with some faint retroperitoneal hazy stranding. Such an appearance could be seen as an early stage of retroperitoneal fibrosis, early  features of a lymphoproliferative disorder, or potentially a reactive process. Recommend close clinical assessment for features of the above differential considerations and consider further evaluation with outpatient PET-CT. 2. Mild thickening versus underdistention of the distal thoracic esophagus, correlate for symptoms of esophagitis. 3. Bilateral nonobstructing nephrolithiasis. 4. Circumferential bladder wall thickening, possibly related to underdistention. Though given concomitant mild nonspecific perinephric stranding correlation with urinalysis to exclude cystitis and ascending tract infection. 5. Nodular hepatic surface contour, suggestive of cirrhosis. Correlate with history and serologies if felt to be clinically appropriate. 6. Aortic Atherosclerosis (ICD10-I70.0). Electronically Signed   By: Kreg Shropshire  M.D.   On: 08/09/2019 15:28    Procedures Procedures (including critical care time)  Medications Ordered in ED Medications  iohexol (OMNIPAQUE) 300 MG/ML solution 100 mL (100 mLs Intravenous Contrast Given 08/09/19 1451)    ED Course  I have reviewed the triage vital signs and the nursing notes.  Pertinent labs & imaging results that were available during my care of the patient were reviewed by me and considered in my medical decision making (see chart for details).  54 year old male presents with rectal bleeding for the past couple of days associated with lower abdominal pain and nausea.  Blood pressure is elevated but otherwise vital signs are reassuring.  Abdomen is soft and tender across the lower abdomen.  His rectal exam reveals external hemorrhoids without active bleeding.  He has a scant amount of gross red blood with digital exam but no tenderness.  Lab work obtained shows normal CBC with elevated creatinine.  Patient is aware of his kidney disease.  Lipase is minimally elevated but he has no upper abdominal pain.  UA is normal.  A CT was obtained which shows nonspecific enlarged  retroperitoneal lymph nodes and stranding which is nonspecific.  We will treat with suppositories and advised follow-up with GI regarding imaging findings.  He was also given a referral to general surgery regarding his chronic abscess and hemorrhoids.  MDM Rules/Calculators/A&P                           Final Clinical Impression(s) / ED Diagnoses Final diagnoses:  Rectal bleeding  Lower abdominal pain  External hemorrhoids    Rx / DC Orders ED Discharge Orders    None       Bethel Born, PA-C 08/09/19 Spero Geralds, MD 08/09/19 2356

## 2019-08-09 NOTE — ED Triage Notes (Signed)
Bright red rectal bleeding x4 days.  Sts he has had this problem before and it was internal hemorrhoids.  Sts he had prescription suppositories to use at that time but doesn't have any now.  Having abd pain and back pain with it.  Nausea and loose stools but no vomiting.

## 2019-08-09 NOTE — Discharge Instructions (Signed)
Please follow up with Dr. Elnoria Howard regarding bleeding and enlarged lymph nodes Use anusol suppositories as directed Take Zofran as needed for nausea Follow up with your PCP regarding blood pressure control and your kidney function Make an appointment with general surgery for a consult regarding the hemorrhoids and abscess

## 2019-08-10 DIAGNOSIS — K7460 Unspecified cirrhosis of liver: Secondary | ICD-10-CM | POA: Diagnosis not present

## 2019-08-10 DIAGNOSIS — R933 Abnormal findings on diagnostic imaging of other parts of digestive tract: Secondary | ICD-10-CM | POA: Diagnosis not present

## 2019-08-10 DIAGNOSIS — K625 Hemorrhage of anus and rectum: Secondary | ICD-10-CM | POA: Diagnosis not present

## 2019-08-16 ENCOUNTER — Other Ambulatory Visit: Payer: Self-pay

## 2019-08-16 ENCOUNTER — Emergency Department (HOSPITAL_BASED_OUTPATIENT_CLINIC_OR_DEPARTMENT_OTHER)
Admission: EM | Admit: 2019-08-16 | Discharge: 2019-08-16 | Disposition: A | Discharge status: Home / Self Care | Payer: BC Managed Care – PPO | Attending: Emergency Medicine | Admitting: Emergency Medicine

## 2019-08-16 ENCOUNTER — Encounter (HOSPITAL_BASED_OUTPATIENT_CLINIC_OR_DEPARTMENT_OTHER): Payer: Self-pay

## 2019-08-16 DIAGNOSIS — I10 Essential (primary) hypertension: Secondary | ICD-10-CM | POA: Diagnosis not present

## 2019-08-16 DIAGNOSIS — R1084 Generalized abdominal pain: Secondary | ICD-10-CM | POA: Insufficient documentation

## 2019-08-16 DIAGNOSIS — R110 Nausea: Secondary | ICD-10-CM | POA: Diagnosis not present

## 2019-08-16 DIAGNOSIS — F17210 Nicotine dependence, cigarettes, uncomplicated: Secondary | ICD-10-CM | POA: Diagnosis not present

## 2019-08-16 DIAGNOSIS — R509 Fever, unspecified: Secondary | ICD-10-CM | POA: Diagnosis not present

## 2019-08-16 DIAGNOSIS — R1030 Lower abdominal pain, unspecified: Secondary | ICD-10-CM | POA: Diagnosis not present

## 2019-08-16 DIAGNOSIS — R109 Unspecified abdominal pain: Secondary | ICD-10-CM

## 2019-08-16 DIAGNOSIS — R1032 Left lower quadrant pain: Secondary | ICD-10-CM | POA: Diagnosis not present

## 2019-08-16 LAB — LIPASE, BLOOD: Lipase: 35 U/L (ref 11–51)

## 2019-08-16 LAB — URINALYSIS, ROUTINE W REFLEX MICROSCOPIC
Bilirubin Urine: NEGATIVE
Glucose, UA: NEGATIVE mg/dL
Hgb urine dipstick: NEGATIVE
Ketones, ur: NEGATIVE mg/dL
Leukocytes,Ua: NEGATIVE
Nitrite: NEGATIVE
Protein, ur: NEGATIVE mg/dL
Specific Gravity, Urine: 1.025 (ref 1.005–1.030)
pH: 5.5 (ref 5.0–8.0)

## 2019-08-16 LAB — COMPREHENSIVE METABOLIC PANEL
ALT: 22 U/L (ref 0–44)
AST: 23 U/L (ref 15–41)
Albumin: 4.1 g/dL (ref 3.5–5.0)
Alkaline Phosphatase: 76 U/L (ref 38–126)
Anion gap: 9 (ref 5–15)
BUN: 12 mg/dL (ref 6–20)
CO2: 24 mmol/L (ref 22–32)
Calcium: 9.1 mg/dL (ref 8.9–10.3)
Chloride: 107 mmol/L (ref 98–111)
Creatinine, Ser: 1.47 mg/dL — ABNORMAL HIGH (ref 0.61–1.24)
GFR calc Af Amer: >60 mL/min (ref >60)
GFR calc non Af Amer: 53 mL/min — ABNORMAL LOW (ref >60)
Glucose, Bld: 102 mg/dL — ABNORMAL HIGH (ref 70–99)
Potassium: 4 mmol/L (ref 3.5–5.1)
Sodium: 140 mmol/L (ref 135–145)
Total Bilirubin: 0.6 mg/dL (ref 0.3–1.2)
Total Protein: 7.7 g/dL (ref 6.5–8.1)

## 2019-08-16 LAB — CBC
HCT: 46.5 % (ref 39.0–52.0)
Hemoglobin: 15.1 g/dL (ref 13.0–17.0)
MCH: 29.4 pg (ref 26.0–34.0)
MCHC: 32.5 g/dL (ref 30.0–36.0)
MCV: 90.5 fL (ref 80.0–100.0)
Platelets: 197 10*3/uL (ref 150–400)
RBC: 5.14 MIL/uL (ref 4.22–5.81)
RDW: 14.1 % (ref 11.5–15.5)
WBC: 8.6 10*3/uL (ref 4.0–10.5)
nRBC: 0 % (ref 0.0–0.2)

## 2019-08-16 MED ORDER — SODIUM CHLORIDE 0.9% FLUSH
3.0000 mL | Freq: Once | INTRAVENOUS | Status: DC
  Filled 2019-08-16: qty 3

## 2019-08-16 MED ORDER — MORPHINE SULFATE (PF) 4 MG/ML IV SOLN
4.0000 mg | Freq: Once | INTRAVENOUS | Status: AC
  Administered 2019-08-16: 4 mg via INTRAMUSCULAR
  Filled 2019-08-16: qty 1

## 2019-08-16 MED ORDER — MORPHINE SULFATE 15 MG PO TABS: 7.5000 mg | ORAL_TABLET | Freq: Two times a day (BID) | ORAL | 0 refills | Status: DC | PRN

## 2019-08-16 NOTE — ED Triage Notes (Signed)
Pt c/o left side abd pain that radiates down left leg x 1 week-state he was seen here for same c/o-NAD-steady gait

## 2019-08-16 NOTE — ED Provider Notes (Addendum)
[facility] EMERGENCY DEPARTMENT Provider Note   CSN: [id number] Arrival date & time: 08/16/19  1512     History Chief Complaint  Patient presents with  . Abdominal Pain    James Downs is a 54 y.o. male.  HPI   Pt is a 54 y/o male with a h/o arthritis, depression, gout, HLD, HTN, nephrolithiasis, obesity, OSA, who presents to the ED today for eval of lower abd pain. Pain radiates to the left flank pain and down the left leg that started about 10 days ago. States pain is constant, but worsened yesterday.  He reports nausea, subjective fevers. Denies vomiting, dysuria, frequency. Denies numbness to the LLE. States it looked like he passed a kidney stone when he gave a urine sample today. States pain continued after this. States he has been constipated, but took some medication and his last BM earlier today.  He was seen for sxs last week and had a very thorough w/u including CT scan.   He has been seeing Dr. Elnoria Howard with GI. He has organized for pt to have EGD/colonoscopy next month and he has been referred to oncology for PET scan.  Past Medical History:  Diagnosis Date  . Arthritis   . Depression   . Gout   . Hypercholesterolemia   . Hypertension    on meds  . Kidney damage   . Kidney stone   . Obesity   . Sleep apnea    uses CPAP    Patient Active Problem List   Diagnosis Date Noted  . 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 Weakness of left leg 11/04/2013  . Paresthesias 11/04/2013  . Lumbar radiculopathy 11/04/2013  . Degenerative disc disease, lumbar 11/04/2013  . ONYCHOMYCOSIS 12/23/2006  . HYPERLIPIDEMIA 12/23/2006  . ANXIETY DISORDER, GENERALIZED 12/23/2006  . TOBACCO ABUSE 12/23/2006  . SYNDROME, CARPAL TUNNEL 12/23/2006  . HYPERTENSION 12/23/2006  . NEPHROLITHIASIS, HX OF 12/23/2006    Past Surgical History:  Procedure Laterality Date  . FOOT SURGERY Right 1991  . HAND SURGERY Right 1985  . POSTERIOR  LUMBAR FUSION 4 LEVEL N/A 11/29/2013   Procedure: Thoracic Eight, Thoracic Nine, Costotransversectomy   Thoracic Seven-Eight Thoracic Eight-Nine diskectomy;  Surgeon: Lisbeth Renshaw, MD;  Location: [facility];  Service: Neurosurgery;  Laterality: N/A;  Thoracic Eight, Thoracic Nine, Costotransversectomy   Thoracic Seven-Eight Thoracic Eight-Nine diskectomy       Family History  Problem Relation Age of Onset  . Cancer Father        BRAIN/PROSTATE/SKIN  . Hypertension Father   . Cancer Paternal Grandfather   . Heart failure Maternal Grandmother   . Diabetes Maternal Grandmother   . Hypertension Maternal Grandmother   . Hypertension Maternal Grandfather     Social History   Tobacco Use  . Smoking status: Current Every Day Smoker    Packs/day: 1.00    Years: 30.00    Pack years: 30.00    Types: Cigarettes  . Smokeless tobacco: Former Clinical biochemist  . Vaping Use: Never used  Substance Use Topics  . Alcohol use: No  . Drug use: No    Home Medications Prior to Admission medications   Medication Sig Start Date End Date Taking? Authorizing Provider  allopurinol (ZYLOPRIM) 100 MG tablet Take 50 mg by mouth daily. 05/04/19   [provider]  hydrocortisone (ANUSOL-HC) 25 MG suppository Place 1 suppository (25 mg total) rectally 2 (two) times daily. 08/09/19   Bethel Born,  PA-C  ibuprofen (ADVIL) 600 MG tablet Take 1 tablet (600 mg total) by mouth every 6 (six) hours as needed. 06/17/19   Jacalyn Lefevre, MD  MITIGARE 0.6 MG CAPS Take 1 capsule by mouth daily. 05/04/19   [provider]  morphine (MSIR) 15 MG tablet Take 0.5 tablets (7.5 mg total) by mouth every 12 (twelve) hours as needed for severe pain. 08/16/19   Gissella Niblack S, PA-C  ondansetron (ZOFRAN ODT) 4 MG disintegrating tablet Take 1 tablet (4 mg total) by mouth every 8 (eight) hours as needed for nausea or vomiting. 08/09/19   Bethel Born, PA-C  PARoxetine (PAXIL) 40 MG tablet TK 1 T PO  QD IN THE MORNING 03/15/18   [provider]  predniSONE (STERAPRED UNI-PAK 21 TAB) 10 MG (21) TBPK tablet Take 6 tabs for 2 days, then 5 for 2 days, then 4 for 2 days, then 3 for 2 days, 2 for 2 days, then 1 for 2 days 06/17/19   Jacalyn Lefevre, MD  telmisartan (MICARDIS) 20 MG tablet Take 1 tablet (20 mg total) by mouth daily. 04/26/17   Dartha Lodge, PA-C    Allergies    Patient has no known allergies.  Review of Systems   Review of Systems  Constitutional: Positive for fever (subjective). Negative for chills.  HENT: Negative for ear pain and sore throat.   Eyes: Negative for visual disturbance.  Respiratory: Negative for cough and shortness of breath.   Cardiovascular: Negative for chest pain.  Gastrointestinal: Positive for constipation (resolved) and nausea. Negative for abdominal pain, diarrhea and vomiting.  Genitourinary: Negative for dysuria and hematuria.  Musculoskeletal: Negative for back pain.  Skin: Negative for rash.  Neurological: Negative for seizures and syncope.  All other systems reviewed and are negative.   Physical Exam Updated Vital Signs BP (!) 167/111   Pulse 76   Temp 98.1 F (36.7 C) (Oral)   Resp 17   Ht 5\' 7"  (1.702 m)   Wt 101.6 kg   SpO2 98%   BMI 35.08 kg/m   Physical Exam Vitals and nursing note reviewed.  Constitutional:      Appearance: He is well-developed.  HENT:     Head: Normocephalic and atraumatic.  Eyes:     Conjunctiva/sclera: Conjunctivae normal.  Cardiovascular:     Rate and Rhythm: Normal rate and regular rhythm.     Heart sounds: Normal heart sounds. No murmur heard.   Pulmonary:     Effort: Pulmonary effort is normal. No respiratory distress.     Breath sounds: Normal breath sounds. No wheezing, rhonchi or rales.  Abdominal:     General: Bowel sounds are normal.     Palpations: Abdomen is soft. There is no fluid wave.     Tenderness: There is no guarding or rebound.     Comments: Diffuse abd TTP, left  flank TTP  Musculoskeletal:        General: No tenderness.     Cervical back: Neck supple.     Right lower leg: No edema.     Left lower leg: No edema.  Skin:    General: Skin is warm and dry.  Neurological:     Mental Status: He is alert.     ED Results / Procedures / Treatments   Labs (all labs ordered are listed, but only abnormal results are displayed) Labs Reviewed  COMPREHENSIVE METABOLIC PANEL - Abnormal; Notable for the following components:      Result Value  Glucose, Bld 102 (*)    Creatinine, Ser 1.47 (*)    GFR calc non Af Amer 53 (*)    All other components within normal limits  LIPASE, BLOOD  CBC  URINALYSIS, ROUTINE W REFLEX MICROSCOPIC    EKG None  Radiology No results found.  Procedures Procedures (including critical care time)  Medications Ordered in ED Medications  sodium chloride flush (NS) 0.9 % injection 3 mL (has no administration in time range)  morphine 4 MG/ML injection 4 mg (4 mg Intramuscular Given 08/16/19 2012)    ED Course  I have reviewed the triage vital signs and the nursing notes.  Pertinent labs & imaging results that were available during my care of the patient were reviewed by me and considered in my medical decision making (see chart for details).    MDM Rules/Calculators/A&P                          54 year old male presenting for evaluation of left-sided abdominal pain  Reviewed/interpreted labs Cbc w/o leukocytosis or anemia CMP with electrolytes, kidney and liver function Lipase negative UA without evidence of UTI  Reviewed records from prior visit. CT abd/pelvis revealed, "1. Numerous and borderline enlarged retroperitoneal lymph nodes with some faint retroperitoneal hazy stranding. Such an appearance could be seen as an early stage of retroperitoneal fibrosis, early features of a lymphoproliferative disorder, or potentially a reactive process. Recommend close clinical assessment for features of the above  differential considerations and consider further evaluation with outpatient PET-CT. 2. Mild thickening versus underdistention of the distal thoracic esophagus, correlate for symptoms of esophagitis. 3. Bilateral nonobstructing nephrolithiasis. 4. Circumferential bladder wall thickening, possibly related to underdistention. Though given concomitant mild nonspecific perinephric stranding correlation with urinalysis to exclude cystitis and ascending tract infection. 5. Nodular hepatic surface contour, suggestive of cirrhosis. Correlate with history and serologies if felt to be clinically appropriate. 6. Aortic Atherosclerosis (ICD10-I70.0)."  Given a dose of morphine in the ED and felt much improved. Repeat abdominal exam with mild tenderness but no rebound rigidity or guarding. Feel repeat CT imaging not indicated at this time suspect neuropathy as a cause of his symptoms. He has already had a referral to oncology but has yet to follow-up. We will also give him information for oncology so he can call to make an appointment. Will give pain medications for home. Have advised on strict follow-up and return precautions. He voiced understanding of the plan and reasons to return. All questions answered. Patient stable for discharge.  Final Clinical Impression(s) / ED Diagnoses Final diagnoses:  Abdominal pain, unspecified abdominal location    Rx / DC Orders ED Discharge Orders         Ordered    morphine (MSIR) 15 MG tablet  Every 12 hours PRN     Discontinue  Reprint     08/16/19 2125           Rodney Booze, PA-C 08/16/19 2220    Jeven Topper S, PA-C 08/16/19 2222    Virgel Manifold, MD 08/17/19 [phone]

## 2019-08-16 NOTE — Discharge Instructions (Addendum)
Prescription given for morphine. Take medication as directed and do not operate machinery, drive a car, or work while taking this medication as it can make you drowsy.    Please call the oncologist office to schedule an appointment for follow up and to schedule a PET scan.  Additionally, please follow up with your primary care provider within 5-7 days for re-evaluation of your symptoms.   Please return to the emergency department for any new or worsening symptoms.

## 2019-08-19 ENCOUNTER — Telehealth: Payer: Self-pay | Admitting: Hematology

## 2019-08-19 NOTE — Telephone Encounter (Signed)
Received a new pt referral from Dr. Elnoria Howard for Retroperitoneal lymphadenopathy. Mr. James Downs has been cld and scheduled to se Dr. Candise Che on 6/28 at 11am. Pt aware to arrive 15 minutes early.

## 2019-08-22 ENCOUNTER — Other Ambulatory Visit: Payer: Self-pay

## 2019-08-22 ENCOUNTER — Inpatient Hospital Stay: Payer: BC Managed Care – PPO

## 2019-08-22 ENCOUNTER — Telehealth: Payer: Self-pay | Admitting: Hematology

## 2019-08-22 ENCOUNTER — Inpatient Hospital Stay: Payer: BC Managed Care – PPO | Attending: Hematology | Admitting: Hematology

## 2019-08-22 VITALS — BP 141/88 | HR 99 | Temp 98.1°F | Resp 18 | Ht 67.0 in | Wt 223.8 lb

## 2019-08-22 DIAGNOSIS — N200 Calculus of kidney: Secondary | ICD-10-CM | POA: Insufficient documentation

## 2019-08-22 DIAGNOSIS — R5383 Other fatigue: Secondary | ICD-10-CM | POA: Diagnosis not present

## 2019-08-22 DIAGNOSIS — F329 Major depressive disorder, single episode, unspecified: Secondary | ICD-10-CM | POA: Diagnosis not present

## 2019-08-22 DIAGNOSIS — M129 Arthropathy, unspecified: Secondary | ICD-10-CM | POA: Insufficient documentation

## 2019-08-22 DIAGNOSIS — Z87442 Personal history of urinary calculi: Secondary | ICD-10-CM | POA: Diagnosis not present

## 2019-08-22 DIAGNOSIS — Z808 Family history of malignant neoplasm of other organs or systems: Secondary | ICD-10-CM | POA: Diagnosis not present

## 2019-08-22 DIAGNOSIS — R599 Enlarged lymph nodes, unspecified: Secondary | ICD-10-CM | POA: Insufficient documentation

## 2019-08-22 DIAGNOSIS — Z79899 Other long term (current) drug therapy: Secondary | ICD-10-CM | POA: Insufficient documentation

## 2019-08-22 DIAGNOSIS — E669 Obesity, unspecified: Secondary | ICD-10-CM | POA: Diagnosis not present

## 2019-08-22 DIAGNOSIS — I700 Atherosclerosis of aorta: Secondary | ICD-10-CM | POA: Diagnosis not present

## 2019-08-22 DIAGNOSIS — R590 Localized enlarged lymph nodes: Secondary | ICD-10-CM | POA: Diagnosis not present

## 2019-08-22 DIAGNOSIS — G4730 Sleep apnea, unspecified: Secondary | ICD-10-CM | POA: Diagnosis not present

## 2019-08-22 DIAGNOSIS — F17210 Nicotine dependence, cigarettes, uncomplicated: Secondary | ICD-10-CM | POA: Insufficient documentation

## 2019-08-22 DIAGNOSIS — E7800 Pure hypercholesterolemia, unspecified: Secondary | ICD-10-CM | POA: Insufficient documentation

## 2019-08-22 DIAGNOSIS — M109 Gout, unspecified: Secondary | ICD-10-CM | POA: Insufficient documentation

## 2019-08-22 DIAGNOSIS — I10 Essential (primary) hypertension: Secondary | ICD-10-CM | POA: Insufficient documentation

## 2019-08-22 LAB — CBC WITH DIFFERENTIAL/PLATELET
Abs Immature Granulocytes: 0.02 10*3/uL (ref 0.00–0.07)
Basophils Absolute: 0.1 10*3/uL (ref 0.0–0.1)
Basophils Relative: 1 %
Eosinophils Absolute: 0.5 10*3/uL (ref 0.0–0.5)
Eosinophils Relative: 5 %
HCT: 46.8 % (ref 39.0–52.0)
Hemoglobin: 15.5 g/dL (ref 13.0–17.0)
Immature Granulocytes: 0 %
Lymphocytes Relative: 17 %
Lymphs Abs: 1.5 10*3/uL (ref 0.7–4.0)
MCH: 29.2 pg (ref 26.0–34.0)
MCHC: 33.1 g/dL (ref 30.0–36.0)
MCV: 88.3 fL (ref 80.0–100.0)
Monocytes Absolute: 0.8 10*3/uL (ref 0.1–1.0)
Monocytes Relative: 10 %
Neutro Abs: 5.9 10*3/uL (ref 1.7–7.7)
Neutrophils Relative %: 67 %
Platelets: 211 10*3/uL (ref 150–400)
RBC: 5.3 MIL/uL (ref 4.22–5.81)
RDW: 14.1 % (ref 11.5–15.5)
WBC: 8.8 10*3/uL (ref 4.0–10.5)
nRBC: 0 % (ref 0.0–0.2)

## 2019-08-22 LAB — CMP (CANCER CENTER ONLY)
ALT: 25 U/L (ref 0–44)
AST: 22 U/L (ref 15–41)
Albumin: 4.1 g/dL (ref 3.5–5.0)
Alkaline Phosphatase: 91 U/L (ref 38–126)
Anion gap: 11 (ref 5–15)
BUN: 11 mg/dL (ref 6–20)
CO2: 22 mmol/L (ref 22–32)
Calcium: 9.8 mg/dL (ref 8.9–10.3)
Chloride: 107 mmol/L (ref 98–111)
Creatinine: 1.42 mg/dL — ABNORMAL HIGH (ref 0.61–1.24)
GFR, Est AFR Am: >60 mL/min (ref >60)
GFR, Estimated: 55 mL/min — ABNORMAL LOW (ref >60)
Glucose, Bld: 104 mg/dL — ABNORMAL HIGH (ref 70–99)
Potassium: 4 mmol/L (ref 3.5–5.1)
Sodium: 140 mmol/L (ref 135–145)
Total Bilirubin: 0.7 mg/dL (ref 0.3–1.2)
Total Protein: 7.8 g/dL (ref 6.5–8.1)

## 2019-08-22 LAB — LACTATE DEHYDROGENASE: LDH: 176 U/L (ref 98–192)

## 2019-08-22 NOTE — Progress Notes (Signed)
HEMATOLOGY/ONCOLOGY CONSULTATION NOTE  Date of Service: 08/22/2019  Patient Care Team: Georgianne Fickamachandran, Ajith, MD as PCP - General (Internal Medicine)  CHIEF COMPLAINTS/PURPOSE OF CONSULTATION:  Retroperitoneal Lymphadenopathy  HISTORY OF PRESENTING ILLNESS:   James Downs is a wonderful 54 y.o. male who has been referred to us by Dr. Elnoria HowardHung for evaluation and management of Retroperitoneal Lymphadenopathy. The pt reports that he is doing well overall.   The pt reports that he was recently in the hospital due to left-sided flank pain that traveled into his left leg. A few years ago pt had a pinched nerve in his back and the discomfort in his leg was similar. Pt has a history of kidney stones and was discovered to have stones while in the hospital. These appeared to be the source of his discomfort. His lymphadenopathy was an incidental finding on a CT scan.    Pt last felt like himself 3-4 months ago. He has recently been experiencing stress due to having to assist his family with their various medical issues. He has also noticed an increase in fatigue over the last few months. Pt is unaware of any COPD diagnosis. Pt denies current testicular swelling or pain, but notes that he does experience this occasionally when he has kidney stones. He drinks about 48 oz of dark sodas per day and was told to avoid dairy products to help prevent new kidney stones.  Of note prior to the patient's visit today, pt has had CT Abd/Pel ([id number]) completed on 08/09/2019 with results revealing "1. Numerous and borderline enlarged retroperitoneal lymph nodes with some faint retroperitoneal hazy stranding. Such an appearance could be seen as an early stage of retroperitoneal fibrosis, early features of a lymphoproliferative disorder, or potentially a reactive process. Recommend close clinical assessment for features of the above differential considerations and consider further evaluation with outpatient PET-CT.  2. Mild thickening versus underdistention of the distal thoracic esophagus, correlate for symptoms of esophagitis. 3. Bilateral nonobstructing nephrolithiasis. 4. Circumferential bladder wall thickening, possibly related to underdistention. Though given concomitant mild nonspecific perinephric stranding correlation with urinalysis to exclude cystitis and ascending tract infection. 5. Nodular hepatic surface contour, suggestive of cirrhosis. Correlate with history and serologies if felt to be clinically appropriate. 6. Aortic Atherosclerosis (ICD10-I70.0)."   Most recent lab results (08/16/2019) of CBC & CMP is as follows: all values are WNL except for Glucose at 102, Creatinine at 1.47, GFR Est Non Af Am at 53.  On review of systems, pt reports abdominal pain, fatigue  and denies fevers, chills, night sweats, unexpected weight loss, skin rashes, new lumps/bumps, dysuria, difficulty passing urine, constipation, testicular pain/swelling and any other symptoms.   On PMHx the pt reports Kidney stone, Posterior Lumbar Fusion 4 level. On Social Hx the pt reports he is currently smoking 1 ppd.  On FHx the pt reports that his his father had Prostate Cancer and passed from Brain Cancer and his paternal grandfather had Pancreatic Cancer.   MEDICAL HISTORY:  Past Medical History:  Diagnosis Date  . Arthritis   . Depression   . Gout   . Hypercholesterolemia   . Hypertension    on meds  . Kidney damage   . Kidney stone   . Obesity   . Sleep apnea    uses CPAP    SURGICAL HISTORY: Past Surgical History:  Procedure Laterality Date  . FOOT SURGERY Right 1991  . HAND SURGERY Right 1985  . POSTERIOR LUMBAR FUSION 4 LEVEL N/A 11/29/2013  Procedure: Thoracic Eight, Thoracic Nine, Costotransversectomy   Thoracic Seven-Eight Thoracic Eight-Nine diskectomy;  Surgeon: Lisbeth Renshaw, MD;  Location: [facility];  Service: Neurosurgery;  Laterality: N/A;  Thoracic Eight, Thoracic Nine, Costotransversectomy    Thoracic Seven-Eight Thoracic Eight-Nine diskectomy    SOCIAL HISTORY: Social History   Socioeconomic History  . Marital status: Married    Spouse name: Not on file  . Number of children: 2  . Years of education: Not on file  . Highest education level: Not on file  Occupational History  . Not on file  Tobacco Use  . Smoking status: Current Every Day Smoker    Packs/day: 1.00    Years: 30.00    Pack years: 30.00    Types: Cigarettes  . Smokeless tobacco: Former Clinical biochemist  . Vaping Use: Never used  Substance and Sexual Activity  . Alcohol use: No  . Drug use: No  . Sexual activity: Not on file  Other Topics Concern  . Not on file  Social History Narrative   Patient is married with 2 children.   Patient is right handed.   Patient has high school education.   Patient drinks 3-4 16 oz daily.   Social Determinants of Health   Financial Resource Strain:   . Difficulty of Paying Living Expenses:   Food Insecurity:   . Worried About Programme researcher, broadcasting/film/video in the Last Year:   . Barista in the Last Year:   Transportation Needs:   . Freight forwarder (Medical):   Marland Kitchen Lack of Transportation (Non-Medical):   Physical Activity:   . Days of Exercise per Week:   . Minutes of Exercise per Session:   Stress:   . Feeling of Stress :   Social Connections:   . Frequency of Communication with Friends and Family:   . Frequency of Social Gatherings with Friends and Family:   . Attends Religious Services:   . Active Member of Clubs or Organizations:   . Attends Banker Meetings:   Marland Kitchen Marital Status:   Intimate Partner Violence:   . Fear of Current or Ex-Partner:   . Emotionally Abused:   Marland Kitchen Physically Abused:   . Sexually Abused:     FAMILY HISTORY: Family History  Problem Relation Age of Onset  . Cancer Father        BRAIN/PROSTATE/SKIN  . Hypertension Father   . Cancer Paternal Grandfather   . Heart failure Maternal Grandmother   . Diabetes  Maternal Grandmother   . Hypertension Maternal Grandmother   . Hypertension Maternal Grandfather     ALLERGIES:  has No Known Allergies.  MEDICATIONS:  Current Outpatient Medications  Medication Sig Dispense Refill  . allopurinol (ZYLOPRIM) 100 MG tablet Take 50 mg by mouth daily.    . hydrocortisone (ANUSOL-HC) 25 MG suppository Place 1 suppository (25 mg total) rectally 2 (two) times daily. 12 suppository 0  . ibuprofen (ADVIL) 600 MG tablet Take 1 tablet (600 mg total) by mouth every 6 (six) hours as needed. 30 tablet 0  . MITIGARE 0.6 MG CAPS Take 1 capsule by mouth daily.    Marland Kitchen morphine (MSIR) 15 MG tablet Take 0.5 tablets (7.5 mg total) by mouth every 12 (twelve) hours as needed for severe pain. 4 tablet 0  . ondansetron (ZOFRAN ODT) 4 MG disintegrating tablet Take 1 tablet (4 mg total) by mouth every 8 (eight) hours as needed for nausea or vomiting. 8 tablet 0  .  PARoxetine (PAXIL) 40 MG tablet TK 1 T PO QD IN THE MORNING    . telmisartan (MICARDIS) 20 MG tablet Take 1 tablet (20 mg total) by mouth daily. 5 tablet 0   No current facility-administered medications for this visit.    REVIEW OF SYSTEMS:    10 Point review of Systems was done is negative except as noted above.  PHYSICAL EXAMINATION: ECOG PERFORMANCE STATUS: 1 - Symptomatic but completely ambulatory  . Vitals:   08/22/19 1126  BP: (!) 141/88  Pulse: 99  Resp: 18  Temp: 98.1 F (36.7 C)  SpO2: 98%   Filed Weights   08/22/19 1126  Weight: 223 lb 12.8 oz (101.5 kg)   .Body mass index is 35.05 kg/m.  GENERAL:alert, in no acute distress and comfortable SKIN: no acute rashes, no significant lesions EYES: conjunctiva are pink and non-injected, sclera anicteric OROPHARYNX: MMM, no exudates, no oropharyngeal erythema or ulceration NECK: supple, no JVD LYMPH:  no palpable lymphadenopathy in the cervical, axillary or inguinal regions LUNGS: clear to auscultation b/l with normal respiratory effort HEART:  regular rate & rhythm ABDOMEN:  normoactive bowel sounds , non tender, not distended. Extremity: no pedal edema PSYCH: alert & oriented x 3 with fluent speech NEURO: no focal motor/sensory deficits  LABORATORY DATA:  I have reviewed the data as listed  . CBC Latest Ref Rng & Units 08/22/2019 08/16/2019 08/09/2019  WBC 4.0 - 10.5 K/uL 8.8 8.6 7.2  Hemoglobin 13.0 - 17.0 g/dL 62.2 63.3 35.4  Hematocrit 39 - 52 % 46.8 46.5 43.4  Platelets 150 - 400 K/uL 211 197 177    . CMP Latest Ref Rng & Units 08/22/2019 08/16/2019 08/09/2019  Glucose 70 - 99 mg/dL 562(B) 638(L) 373(S)  BUN 6 - 20 mg/dL 11 12 11   Creatinine 0.61 - 1.24 mg/dL ) 2.87(G) 8.11(X)  Sodium 135 - 145 mmol/L 140 140 141  Potassium 3.5 - 5.1 mmol/L 4.0 4.0 4.7  Chloride 98 - 111 mmol/L 107 107 113(H)  CO2 22 - 32 mmol/L 22 24 22   Calcium 8.9 - 10.3 mg/dL 9.8 9.1 8.9  Total Protein 6.5 - 8.1 g/dL 7.8 7.7 6.6  Total Bilirubin 0.3 - 1.2 mg/dL 0.7 0.6 0.5  Alkaline Phos 38 - 126 U/L 91 76 70  AST 15 - 41 U/L 22 23 21   ALT 0 - 44 U/L 25 22 19      RADIOGRAPHIC STUDIES: I have personally reviewed the radiological images as listed and agreed with the findings in the report. CT Abdomen Pelvis W Contrast  Result Date: 08/09/2019 CLINICAL DATA:  Rectal bleeding for 4 days abdominal and back pain EXAM: CT ABDOMEN AND PELVIS WITH CONTRAST TECHNIQUE: Multidetector CT imaging of the abdomen and pelvis was performed using the standard protocol following bolus administration of intravenous contrast. CONTRAST:  OMNIPAQUE IOHEXOL 300 MG/ML  SOLN COMPARISON:  CT abdomen and pelvis 06/13/2018 FINDINGS: Lower chest: Dependent atelectatic changes in the lung bases. Additional bandlike areas of opacity may reflect further atelectasis or scarring. Normal heart size. No pericardial effusion. Coronary artery calcifications are present. Hepatobiliary: Nodular hepatic surface contour. No focal concerning liver lesions. Hepatic attenuation  remains within normal limits. Normal gallbladder. No visible calcified gallstones. No biliary ductal dilatation. Pancreas: Partial fatty replacement of the pancreas. No pancreatic inflammation, ductal dilatation or discernible lesions. Spleen: Normal splenic size. No concerning lesions. Small accessory splenule superiorly. Adrenals/Urinary Tract: Normal adrenal glands. Kidneys enhance uniformly and excrete symmetrically. Few subcentimeter hypoattenuating foci in both kidneys  too small to fully characterize on CT imaging but statistically likely benign. Bilateral nonobstructing nephrolithiasis. No obstructive urolithiasis. No hydronephrosis. Mild bilateral symmetric perinephric stranding, a nonspecific finding. Circumferential bladder wall thickening, possibly related to underdistention. Stomach/Bowel: Mild thickening versus underdistention of the distal thoracic esophagus, correlate for symptoms. Stomach and duodenum are unremarkable. No small bowel thickening or dilatation. A normal appendix is visualized. No colonic dilatation or wall thickening. Vascular/Lymphatic: There is faint periaortic hazy stranding within the retroperitoneum with clustered and numerous lymph nodes including several borderline enlarged nodes for instance a 14 mm left periaortic node (2/42) and a smaller 10 mm anterior periaortic node (2/36). No other suspicious adenopathy. Atherosclerotic calcifications throughout the abdominal aorta and branch vessels. No aneurysm or ectasia. Reproductive: The prostate and seminal vesicles are unremarkable. Other: No abdominopelvic free fluid or free gas. No bowel containing hernias. Musculoskeletal: No acute osseous abnormality or suspicious osseous lesion. Multilevel degenerative changes are present in the imaged portions of the spine. Mild retrolisthesis L2 on 3 is unchanged from prior. Multiple bone islands in the proximal femora and ilia. IMPRESSION: 1. Numerous and borderline enlarged retroperitoneal  lymph nodes with some faint retroperitoneal hazy stranding. Such an appearance could be seen as an early stage of retroperitoneal fibrosis, early features of a lymphoproliferative disorder, or potentially a reactive process. Recommend close clinical assessment for features of the above differential considerations and consider further evaluation with outpatient PET-CT. 2. Mild thickening versus underdistention of the distal thoracic esophagus, correlate for symptoms of esophagitis. 3. Bilateral nonobstructing nephrolithiasis. 4. Circumferential bladder wall thickening, possibly related to underdistention. Though given concomitant mild nonspecific perinephric stranding correlation with urinalysis to exclude cystitis and ascending tract infection. 5. Nodular hepatic surface contour, suggestive of cirrhosis. Correlate with history and serologies if felt to be clinically appropriate. 6. Aortic Atherosclerosis (ICD10-I70.0). Electronically Signed   By: Kreg Shropshire M.D.   On: 08/09/2019 15:28    ASSESSMENT & PLAN:   54 yo with   1) Retroperitoneal Lymphadenopathy PLAN: -Discussed patient's most recent labs from 08/16/2019, all values are WNL except for Glucose at 102, Creatinine at 1.47, GFR Est Non Af Am at 53. -Discussed 08/09/2019 CT Abd/Pel ([id number]) which revealed "1. Numerous and borderline enlarged retroperitoneal lymph nodes with some faint retroperitoneal hazy stranding. Such an appearance could be seen as an early stage of retroperitoneal fibrosis, early features of a lymphoproliferative disorder, or potentially a reactive process. Recommend close clinical assessment for features of the above differential considerations and consider further evaluation with outpatient PET-CT.  -Advised pt that retroperitoneal hazy stranding could be caused by kidney inflammation, or other inflammation in the area.  -Advised pt that there is a possibility that he has a lymphoma or retroperitoneal fibrosis.    -Recommend pt reduce soda intake and drink at least 48-64 oz of water per day.  -Recommend complete smoking cessation.  -Will get labs today and send tumor markers to r/o germ cell tumors. -Will get PET/CT scan in 1 week  -Will see back in 2 weeks via phone    FOLLOW UP: Labs today PET/CT in 1 week Phone visit with Dr Candise Che in 2 weeks   All of the patients questions were answered with apparent satisfaction. The patient knows to call the clinic with any problems, questions or concerns.  I spent 35 mins counseling the patient face to face. The total time spent in the appointment was 45 minutes and more than 50% was on counseling and direct patient cares.    Dilynn Munroe  Candise Che MD MS AAHIVMS [facility] James Downs Hematology/Oncology Physician [facility]  (Office):       [phone] (Work cell):  [phone] (Fax):           [phone]  08/22/2019 4:39 PM  I, Carollee Herter, am acting as a scribe for Dr. Wyvonnia Lora.   .I have reviewed the above documentation for accuracy and completeness, and I agree with the above. Johney Maine MD

## 2019-08-22 NOTE — Telephone Encounter (Signed)
Scheduled per los. Patient declined printout  

## 2019-08-23 LAB — PSA, TOTAL AND FREE
PSA, Free Pct: 30 %
PSA, Free: 0.18 ng/mL
Prostate Specific Ag, Serum: 0.6 ng/mL (ref 0.0–4.0)

## 2019-08-23 LAB — MULTIPLE MYELOMA PANEL, SERUM
Albumin SerPl Elph-Mcnc: 3.7 g/dL (ref 2.9–4.4)
Albumin/Glob SerPl: 1.1 (ref 0.7–1.7)
Alpha 1: 0.3 g/dL (ref 0.0–0.4)
Alpha2 Glob SerPl Elph-Mcnc: 0.9 g/dL (ref 0.4–1.0)
B-Globulin SerPl Elph-Mcnc: 1 g/dL (ref 0.7–1.3)
Gamma Glob SerPl Elph-Mcnc: 1.3 g/dL (ref 0.4–1.8)
Globulin, Total: 3.4 g/dL (ref 2.2–3.9)
IgA: 250 mg/dL (ref 90–386)
IgG (Immunoglobin G), Serum: 1283 mg/dL (ref 603–1613)
IgM (Immunoglobulin M), Srm: 85 mg/dL (ref 20–172)
Total Protein ELP: 7.1 g/dL (ref 6.0–8.5)

## 2019-08-23 LAB — BETA HCG QUANT (REF LAB): hCG Quant: <1 m[IU]/mL (ref 0–3)

## 2019-08-23 LAB — AFP TUMOR MARKER: AFP, Serum, Tumor Marker: 2.9 ng/mL (ref 0.0–8.3)

## 2019-08-30 ENCOUNTER — Other Ambulatory Visit: Payer: Self-pay

## 2019-08-30 ENCOUNTER — Ambulatory Visit (HOSPITAL_COMMUNITY)
Admission: RE | Admit: 2019-08-30 | Discharge: 2019-08-30 | Disposition: A | Discharge status: Home / Self Care | Payer: BC Managed Care – PPO | Source: Ambulatory Visit | Attending: Hematology | Admitting: Hematology

## 2019-08-30 DIAGNOSIS — R590 Localized enlarged lymph nodes: Secondary | ICD-10-CM | POA: Insufficient documentation

## 2019-08-30 DIAGNOSIS — K449 Diaphragmatic hernia without obstruction or gangrene: Secondary | ICD-10-CM | POA: Diagnosis not present

## 2019-08-30 DIAGNOSIS — K7469 Other cirrhosis of liver: Secondary | ICD-10-CM | POA: Diagnosis not present

## 2019-08-30 DIAGNOSIS — K766 Portal hypertension: Secondary | ICD-10-CM | POA: Diagnosis not present

## 2019-08-30 DIAGNOSIS — N200 Calculus of kidney: Secondary | ICD-10-CM | POA: Diagnosis not present

## 2019-08-30 LAB — GLUCOSE, CAPILLARY: Glucose-Capillary: 106 mg/dL — ABNORMAL HIGH (ref 70–99)

## 2019-08-30 MED ORDER — FLUDEOXYGLUCOSE F - 18 (FDG) INJECTION
11.1000 | Freq: Once | INTRAVENOUS | Status: AC | PRN
  Administered 2019-08-30: 11.1 via INTRAVENOUS

## 2019-08-31 NOTE — Progress Notes (Signed)
HEMATOLOGY/ONCOLOGY CLINIC NOTE  Date of Service: 09/05/2019  Patient Care Team: Georgianne Fick, MD as PCP - General (Internal Medicine)  CHIEF COMPLAINTS/PURPOSE OF CONSULTATION:  Retroperitoneal Lymphadenopathy  HISTORY OF PRESENTING ILLNESS:  James Downs is a wonderful 54 y.o. male who has been referred to [place] by Dr. Elnoria Howard for evaluation and management of Retroperitoneal Lymphadenopathy. The pt reports that he is doing well overall.   The pt reports that he was recently in the hospital due to left-sided flank pain that traveled into his left leg. A few years ago pt had a pinched nerve in his back and the discomfort in his leg was similar. Pt has a history of kidney stones and was discovered to have stones while in the hospital. These appeared to be the source of his discomfort. His lymphadenopathy was an incidental finding on a CT scan.    Pt last felt like himself 3-4 months ago. He has recently been experiencing stress due to having to assist his family with their various medical issues. He has also noticed an increase in fatigue over the last few months. Pt is unaware of any COPD diagnosis. Pt denies current testicular swelling or pain, but notes that he does experience this occasionally when he has kidney stones. He drinks about 48 oz of dark sodas per day and was told to avoid dairy products to help prevent new kidney stones.  Of note prior to the patient's visit today, pt has had CT Abd/Pel ([id number]) completed on 08/09/2019 with results revealing "1. Numerous and borderline enlarged retroperitoneal lymph nodes with some faint retroperitoneal hazy stranding. Such an appearance could be seen as an early stage of retroperitoneal fibrosis, early features of a lymphoproliferative disorder, or potentially a reactive process. Recommend close clinical assessment for features of the above differential considerations and consider further evaluation with outpatient PET-CT. 2. Mild  thickening versus underdistention of the distal thoracic esophagus, correlate for symptoms of esophagitis. 3. Bilateral nonobstructing nephrolithiasis. 4. Circumferential bladder wall thickening, possibly related to underdistention. Though given concomitant mild nonspecific perinephric stranding correlation with urinalysis to exclude cystitis and ascending tract infection. 5. Nodular hepatic surface contour, suggestive of cirrhosis. Correlate with history and serologies if felt to be clinically appropriate. 6. Aortic Atherosclerosis (ICD10-I70.0)."   Most recent lab results (08/16/2019) of CBC & CMP is as follows: all values are WNL except for Glucose at 102, Creatinine at 1.47, GFR Est Non Af Am at 53.  On review of systems, pt reports abdominal pain, fatigue  and denies fevers, chills, night sweats, unexpected weight loss, skin rashes, new lumps/bumps, dysuria, difficulty passing urine, constipation, testicular pain/swelling and any other symptoms.   On PMHx the pt reports Kidney stone, Posterior Lumbar Fusion 4 level. On Social Hx the pt reports he is currently smoking 1 ppd.  On FHx the pt reports that his his father had Prostate Cancer and passed from Brain Cancer and his paternal grandfather had Pancreatic Cancer.   INTERVAL HISTORY: I connected with  James Downs on 09/05/19 by telephone and verified that I am speaking with the correct person using two identifiers.   I discussed the limitations of evaluation and management by telemedicine. The patient expressed understanding and agreed to proceed.  Other persons participating in the visit and their role in the encounter:       -Carollee Herter, Medical Scribe  [name] location: Home Provider's location: [facility] James Downs is a wonderful 54 y.o. male who is  here for evaluation and management of Retroperitoneal Lymphadenopathy. The patient's last visit with [place] was on 08/22/2019. The pt reports that he is doing  well overall.  The pt reports that he has felt well and denies any recent urinary or infection symptoms. He has not been to see a Insurance underwriter and is unsure as to why he continues to have kidney stones.   Of note since the patient's last visit, pt has had PET/CT ([id number]) completed on 08/30/2019 with results revealing "1. Signs of cirrhosis and portal hypertension with mildly enlarged lymph nodes in the upper abdomen displaying mild increased FDG uptake slightly above background liver for the largest lymph node in the porta hepatis. Findings are likely related to chronic liver disease. Would suggest a short interval follow-up given mild FDG uptake with CT. Perhaps a screening CT in the setting of cirrhosis would be appropriate in a six-month interval. 2. Retroperitoneal lymph nodes do not display uptake above blood pool activity or liver activity. 3. Symmetric testicular uptake nonspecific but slightly above expected. Correlate with any symptoms in this area. Consider testicular sonography for further evaluation in this patient with no known diagnosis of malignancy. 4. Nephrolithiasis."  Lab results (08/22/19) of CBC w/diff and CMP is as follows: all values are WNL except for Glucose at 104, Creatinine at 1.42, GFR Est Non Af Am at 55. 08/22/2019 LDH at 176 08/22/2019 AFP tumor marker at 2.9 08/22/2019 HCG tumor marker at <1 08/22/2019 MMP shows all values are WNL 08/22/2019 PSA free at 0.18, PSA free Pct at 30.0, Prostate Specific Ag Serum at 0.6  On review of systems, pt denies testicular pain, dysuria, hematuria, polyuria, fevers and any other symptoms.   MEDICAL HISTORY:  Past Medical History:  Diagnosis Date  . Arthritis   . Depression   . Gout   . Hypercholesterolemia   . Hypertension    on meds  . Kidney damage   . Kidney stone   . Obesity   . Sleep apnea    uses CPAP    SURGICAL HISTORY: Past Surgical History:  Procedure Laterality Date  . FOOT SURGERY Right 1991  . HAND  SURGERY Right 1985  . POSTERIOR LUMBAR FUSION 4 LEVEL N/A 11/29/2013   Procedure: Thoracic Eight, Thoracic Nine, Costotransversectomy   Thoracic Seven-Eight Thoracic Eight-Nine diskectomy;  Surgeon: Lisbeth Renshaw, MD;  Location: [facility];  Service: Neurosurgery;  Laterality: N/A;  Thoracic Eight, Thoracic Nine, Costotransversectomy   Thoracic Seven-Eight Thoracic Eight-Nine diskectomy    SOCIAL HISTORY: Social History   Socioeconomic History  . Marital status: Married    Spouse name: Not on file  . Number of children: 2  . Years of education: Not on file  . Highest education level: Not on file  Occupational History  . Not on file  Tobacco Use  . Smoking status: Current Every Day Smoker    Packs/day: 1.00    Years: 30.00    Pack years: 30.00    Types: Cigarettes  . Smokeless tobacco: Former Clinical biochemist  . Vaping Use: Never used  Substance and Sexual Activity  . Alcohol use: No  . Drug use: No  . Sexual activity: Not on file  Other Topics Concern  . Not on file  Social History Narrative   Patient is married with 2 children.   Patient is right handed.   Patient has high school education.   Patient drinks 3-4 16 oz daily.   Social Determinants of Corporate investment banker  Strain:   . Difficulty of Paying Living Expenses:   Food Insecurity:   . Worried About Programme researcher, broadcasting/film/videounning Out of Food in the Last Year:   . Baristaan Out of Food in the Last Year:   Transportation Needs:   . Freight forwarderLack of Transportation (Medical):   Marland Kitchen. Lack of Transportation (Non-Medical):   Physical Activity:   . Days of Exercise per Week:   . Minutes of Exercise per Session:   Stress:   . Feeling of Stress :   Social Connections:   . Frequency of Communication with Friends and Family:   . Frequency of Social Gatherings with Friends and Family:   . Attends Religious Services:   . Active Member of Clubs or Organizations:   . Attends BankerClub or Organization Meetings:   Marland Kitchen. Marital Status:   Intimate Partner  Violence:   . Fear of Current or Ex-Partner:   . Emotionally Abused:   Marland Kitchen. Physically Abused:   . Sexually Abused:     FAMILY HISTORY: Family History  Problem Relation Age of Onset  . Cancer Father        BRAIN/PROSTATE/SKIN  . Hypertension Father   . Cancer Paternal Grandfather   . Heart failure Maternal Grandmother   . Diabetes Maternal Grandmother   . Hypertension Maternal Grandmother   . Hypertension Maternal Grandfather     ALLERGIES:  has No Known Allergies.  MEDICATIONS:  Current Outpatient Medications  Medication Sig Dispense Refill  . allopurinol (ZYLOPRIM) 100 MG tablet Take 50 mg by mouth daily.    . hydrocortisone (ANUSOL-HC) 25 MG suppository Place 1 suppository (25 mg total) rectally 2 (two) times daily. 12 suppository 0  . ibuprofen (ADVIL) 600 MG tablet Take 1 tablet (600 mg total) by mouth every 6 (six) hours as needed. 30 tablet 0  . MITIGARE 0.6 MG CAPS Take 1 capsule by mouth daily.    Marland Kitchen. morphine (MSIR) 15 MG tablet Take 0.5 tablets (7.5 mg total) by mouth every 12 (twelve) hours as needed for severe pain. 4 tablet 0  . ondansetron (ZOFRAN ODT) 4 MG disintegrating tablet Take 1 tablet (4 mg total) by mouth every 8 (eight) hours as needed for nausea or vomiting. 8 tablet 0  . PARoxetine (PAXIL) 40 MG tablet TK 1 T PO QD IN THE MORNING    . telmisartan (MICARDIS) 20 MG tablet Take 1 tablet (20 mg total) by mouth daily. 5 tablet 0   No current facility-administered medications for this visit.    REVIEW OF SYSTEMS:   A 10+ POINT REVIEW OF SYSTEMS WAS OBTAINED including neurology, dermatology, psychiatry, cardiac, respiratory, lymph, extremities, GI, GU, Musculoskeletal, constitutional, breasts, reproductive, HEENT.  All pertinent positives are noted in the HPI.  All others are negative.   PHYSICAL EXAMINATION: ECOG PERFORMANCE STATUS: 1 - Symptomatic but completely ambulatory  .Telehealth visit   LABORATORY DATA:  I have reviewed the data as  listed  . CBC Latest Ref Rng & Units 08/22/2019 08/16/2019 08/09/2019  WBC 4.0 - 10.5 K/uL 8.8 8.6 7.2  Hemoglobin 13.0 - 17.0 g/dL 13.215.5 44.015.1 10.214.0  Hematocrit 39 - 52 % 46.8 46.5 43.4  Platelets 150 - 400 K/uL 211 197 177    . CMP Latest Ref Rng & Units 08/22/2019 08/16/2019 08/09/2019  Glucose 70 - 99 mg/dL 725(D104(H) 664(Q102(H) 034(V111(H)  BUN 6 - 20 mg/dL 11 12 11   Creatinine 0.61 - 1.24 mg/dL 4.25(Z1.42(H) 5.63(O1.47(H) 7.56(E1.51(H)  Sodium 135 - 145 mmol/L 140 140 141  Potassium 3.5 - 5.1  mmol/L 4.0 4.0 4.7  Chloride 98 - 111 mmol/L 107 107 113(H)  CO2 22 - 32 mmol/L Calcium 8.9 - 10.3 mg/dL 9.8 9.1 8.9  Total Protein 6.5 - 8.1 g/dL 7.8 7.7 6.6  Total Bilirubin 0.3 - 1.2 mg/dL 0.7 0.6 0.5  Alkaline Phos 38 - 126 U/L 91 76 70  AST 15 - 41 U/L ALT 0 - 44 U/L RADIOGRAPHIC STUDIES: I have personally reviewed the radiological images as listed and agreed with the findings in the report. CT Abdomen Pelvis W Contrast  Result Date: 08/09/2019 CLINICAL DATA:  Rectal bleeding for 4 days abdominal and back pain EXAM: CT ABDOMEN AND PELVIS WITH CONTRAST TECHNIQUE: Multidetector CT imaging of the abdomen and pelvis was performed using the standard protocol following bolus administration of intravenous contrast. CONTRAST:  OMNIPAQUE IOHEXOL 300 MG/ML  SOLN COMPARISON:  CT abdomen and pelvis 06/13/2018 FINDINGS: Lower chest: Dependent atelectatic changes in the lung bases. Additional bandlike areas of opacity may reflect further atelectasis or scarring. Normal heart size. No pericardial effusion. Coronary artery calcifications are present. Hepatobiliary: Nodular hepatic surface contour. No focal concerning liver lesions. Hepatic attenuation remains within normal limits. Normal gallbladder. No visible calcified gallstones. No biliary ductal dilatation. Pancreas: Partial fatty replacement of the pancreas. No pancreatic inflammation, ductal dilatation or discernible lesions. Spleen: Normal  splenic size. No concerning lesions. Small accessory splenule superiorly. Adrenals/Urinary Tract: Normal adrenal glands. Kidneys enhance uniformly and excrete symmetrically. Few subcentimeter hypoattenuating foci in both kidneys too small to fully characterize on CT imaging but statistically likely benign. Bilateral nonobstructing nephrolithiasis. No obstructive urolithiasis. No hydronephrosis. Mild bilateral symmetric perinephric stranding, a nonspecific finding. Circumferential bladder wall thickening, possibly related to underdistention. Stomach/Bowel: Mild thickening versus underdistention of the distal thoracic esophagus, correlate for symptoms. Stomach and duodenum are unremarkable. No small bowel thickening or dilatation. A normal appendix is visualized. No colonic dilatation or wall thickening. Vascular/Lymphatic: There is faint periaortic hazy stranding within the retroperitoneum with clustered and numerous lymph nodes including several borderline enlarged nodes for instance a 14 mm left periaortic node (2/42) and a smaller 10 mm anterior periaortic node (2/36). No other suspicious adenopathy. Atherosclerotic calcifications throughout the abdominal aorta and branch vessels. No aneurysm or ectasia. Reproductive: The prostate and seminal vesicles are unremarkable. Other: No abdominopelvic free fluid or free gas. No bowel containing hernias. Musculoskeletal: No acute osseous abnormality or suspicious osseous lesion. Multilevel degenerative changes are present in the imaged portions of the spine. Mild retrolisthesis L2 on 3 is unchanged from prior. Multiple bone islands in the proximal femora and ilia. IMPRESSION: 1. Numerous and borderline enlarged retroperitoneal lymph nodes with some faint retroperitoneal hazy stranding. Such an appearance could be seen as an early stage of retroperitoneal fibrosis, early features of a lymphoproliferative disorder, or potentially a reactive process. Recommend close clinical  assessment for features of the above differential considerations and consider further evaluation with outpatient PET-CT. 2. Mild thickening versus underdistention of the distal thoracic esophagus, correlate for symptoms of esophagitis. 3. Bilateral nonobstructing nephrolithiasis. 4. Circumferential bladder wall thickening, possibly related to underdistention. Though given concomitant mild nonspecific perinephric stranding correlation with urinalysis to exclude cystitis and ascending tract infection. 5. Nodular hepatic surface contour, suggestive of cirrhosis. Correlate with history and serologies if felt to be clinically appropriate. 6. Aortic Atherosclerosis (ICD10-I70.0). Electronically Signed   By: Kreg Shropshire M.D.   On: 08/09/2019 15:28   NM PET  Image Restag (PS) Skull Base To Thigh  Result Date: 08/30/2019 CLINICAL DATA:  Initial treatment strategy for retroperitoneal adenopathy. EXAM: NUCLEAR MEDICINE PET SKULL BASE TO THIGH TECHNIQUE: 11.1 mCi F-18 FDG was injected intravenously. Full-ring PET imaging was performed from the skull base to thigh after the radiotracer. CT data was obtained and used for attenuation correction and anatomic localization. Fasting blood glucose: 106 mg/dl COMPARISON:  Abdomen and pelvis CT from March 11, 2019 FINDINGS: Mediastinal blood pool activity: SUV max 3.32 Liver activity: SUV max 3.78 NECK: No hypermetabolic lymph nodes in the neck. Incidental CT findings: none CHEST: No hypermetabolic mediastinal or hilar nodes. No suspicious pulmonary nodules on the CT scan. Incidental CT findings: Calcified atheromatous plaque in the thoracic aorta. Heart size is normal. Three-vessel coronary artery disease. No pericardial effusion. Scattered small lymph nodes in the chest with out pathologic enlargement or uptake as discussed above. Signs of calcified granulomatous change in RIGHT chest with scattered calcified granulomata. No consolidation. No pleural effusion. Mildly limited  assessment of pulmonary parenchyma due to respiratory motion. ABDOMEN/PELVIS: Mild enlargement of periportal lymph nodes largest 18 mm in the portacaval station. (SUVmax = 4.2) this is the largest and most FDG avid lymph node in the upper abdomen. Small lymph nodes throughout the LEFT retroperitoneum largest (image 132, series 4) 11 mm short axis (SUVmax = 2.2) No solid organ uptake beyond expected FDG uptake. Splenic activity slightly less than liver activity. CT findings: Liver displays a cirrhotic morphology. Spleen is enlarged. No pericholecystic stranding. No peripancreatic stranding. Numerous bilateral renal calculi with similar appearance. Mild perinephric stranding. No hydronephrosis. Urinary bladder under distended. Small hiatal hernia. No acute small bowel process. Appendix is normal. Prostate unremarkable. Post vasectomy. Mildly increased uptake in the testicles bilateral uptake with maximum SUV of 4.9 SKELETON: No focal hypermetabolic activity to suggest skeletal metastasis. Incidental CT findings: Spinal degenerative changes with signs of hemiarthroplasty in the midthoracic spine. Arthroplasty in the midthoracic spine. IMPRESSION: 1. Signs of cirrhosis and portal hypertension with mildly enlarged lymph nodes in the upper abdomen displaying mild increased FDG uptake slightly above background liver for the largest lymph node in the porta hepatis. Findings are likely related to chronic liver disease. Would suggest a short interval follow-up given mild FDG uptake with CT. Perhaps a screening CT in the setting of cirrhosis would be appropriate in a six-month interval. 2. Retroperitoneal lymph nodes do not display uptake above blood pool activity or liver activity. 3. Symmetric testicular uptake nonspecific but slightly above expected. Correlate with any symptoms in this area. Consider testicular sonography for further evaluation in this patient with no known diagnosis of malignancy. 4. Nephrolithiasis.  Electronically Signed   By: Donzetta Kohut M.D.   On: 08/30/2019 19:30    ASSESSMENT & PLAN:   54 yo with   1) Retroperitoneal Lymphadenopathy  08/09/2019 CT Abd/Pel ([id number]) revealed "1. Numerous and borderline enlarged retroperitoneal lymph nodes with some faint retroperitoneal hazy stranding. Such an appearance could be seen as an early stage of retroperitoneal fibrosis, early features of a lymphoproliferative disorder, or potentially a reactive process."  PLAN: -Discussed pt labwork, 08/22/19; blood counts are nml, blood chemistries are stable, some element of kidney dysfunction  -Discussed LDH, AFP, HCG, MMP, PSA are all WNL -Discussed 08/30/2019 PET/CT ([id number]) which revealed "1. Signs of cirrhosis and portal hypertension with mildly enlarged lymph nodes in the upper abdomen displaying mild increased FDG uptake slightly above background liver for the largest lymph node in the porta hepatis. Findings are  likely related to chronic liver disease.  2. Retroperitoneal lymph nodes do not display uptake above blood pool activity or liver activity. 3. Symmetric testicular uptake nonspecific but slightly above expected. Correlate with any symptoms in this area." -Advised pt that abdominal lymph nodes to not appear very active or large enough to biopsy at this time. -Advised pt that lymphadenopathy could be caused by low-level chronic inflammation, likely from kidney stones.  -Recommend pt completely avoid oxalate-containing beverages  -Will refer to Urology for kidney stones & testicular FDG avidity  -Will get US Scrotum in 3-5 days to further evaluate testicular uptake  -Will repeat CT Abd/Pel in 4 months with labs  -Will see back in 18 weeks     FOLLOW UP: US scrotum in 3-5 days Urology referral for b/l urinary stones, peripnephric stranding and testicular FDG avidity. CT abd/pelvis and labs in 4 months RTC with Dr Candise Che in 18 weeks    The total time spent in the appt was 20  minutes and more than 50% was on counseling and direct patient cares.  All of the patient's questions were answered with apparent satisfaction. The patient knows to call the clinic with any problems, questions or concerns.    Wyvonnia Lora MD MS AAHIVMS [facility] Hematology/Oncology Physician [facility]  (Office):       [phone] (Work cell):  [phone] (Fax):           [phone]  09/05/2019 9:56 AM  I, Carollee Herter, am acting as a scribe for Dr. Wyvonnia Lora.   .I have reviewed the above documentation for accuracy and completeness, and I agree with the above. Johney Maine MD

## 2019-09-05 ENCOUNTER — Inpatient Hospital Stay: Payer: BC Managed Care – PPO | Attending: Hematology | Admitting: Hematology

## 2019-09-05 DIAGNOSIS — N5089 Other specified disorders of the male genital organs: Secondary | ICD-10-CM | POA: Diagnosis not present

## 2019-09-05 DIAGNOSIS — R590 Localized enlarged lymph nodes: Secondary | ICD-10-CM | POA: Diagnosis not present

## 2019-09-05 DIAGNOSIS — R599 Enlarged lymph nodes, unspecified: Secondary | ICD-10-CM | POA: Insufficient documentation

## 2019-09-06 ENCOUNTER — Telehealth: Payer: Self-pay | Admitting: *Deleted

## 2019-09-06 DIAGNOSIS — K3189 Other diseases of stomach and duodenum: Secondary | ICD-10-CM | POA: Diagnosis not present

## 2019-09-06 DIAGNOSIS — D123 Benign neoplasm of transverse colon: Secondary | ICD-10-CM | POA: Diagnosis not present

## 2019-09-06 DIAGNOSIS — K635 Polyp of colon: Secondary | ICD-10-CM | POA: Diagnosis not present

## 2019-09-06 DIAGNOSIS — Z1211 Encounter for screening for malignant neoplasm of colon: Secondary | ICD-10-CM | POA: Diagnosis not present

## 2019-09-06 DIAGNOSIS — K766 Portal hypertension: Secondary | ICD-10-CM | POA: Diagnosis not present

## 2019-09-06 DIAGNOSIS — D124 Benign neoplasm of descending colon: Secondary | ICD-10-CM | POA: Diagnosis not present

## 2019-09-06 DIAGNOSIS — I8500 Esophageal varices without bleeding: Secondary | ICD-10-CM | POA: Diagnosis not present

## 2019-09-06 DIAGNOSIS — D127 Benign neoplasm of rectosigmoid junction: Secondary | ICD-10-CM | POA: Diagnosis not present

## 2019-09-06 NOTE — Telephone Encounter (Signed)
Awaiting return call from patient with information for [employer] Disability request form completion.   Message included questions and this nurses phone number.. What type of work do you perform? If not currently working when was last day of work? When do you plan to return to work?  What work or home activities are you unable to currently perform?

## 2019-09-08 DIAGNOSIS — K625 Hemorrhage of anus and rectum: Secondary | ICD-10-CM | POA: Diagnosis not present

## 2019-09-09 NOTE — Telephone Encounter (Signed)
Spoke with [employer] in reference to message left for patient to contact PCP in regards to [facility] Request form this office received.  [facility] provider has not advised Juliann Pares to be out of work.    [employer] "requests letter with above information for Incident number: [id number] faxed to [phone] along with the request for records from August 16, 2019 to present are still needed as well."  Signed release received with form.

## 2019-09-12 ENCOUNTER — Telehealth: Payer: Self-pay | Admitting: Hematology

## 2019-09-12 NOTE — Telephone Encounter (Signed)
Scheduled per 07/12 los, patient has been called and voicemail was left. 

## 2019-12-21 ENCOUNTER — Emergency Department (HOSPITAL_BASED_OUTPATIENT_CLINIC_OR_DEPARTMENT_OTHER)
Admission: EM | Admit: 2019-12-21 | Discharge: 2019-12-21 | Discharge status: Absent without leave | Payer: BC Managed Care – PPO

## 2019-12-21 ENCOUNTER — Other Ambulatory Visit: Payer: Self-pay

## 2020-01-02 ENCOUNTER — Inpatient Hospital Stay: Payer: Medicaid Other | Attending: Hematology

## 2020-01-09 ENCOUNTER — Other Ambulatory Visit: Payer: Self-pay | Admitting: Hematology

## 2020-01-09 ENCOUNTER — Inpatient Hospital Stay: Payer: Medicaid Other | Admitting: Hematology

## 2020-01-09 DIAGNOSIS — R590 Localized enlarged lymph nodes: Secondary | ICD-10-CM

## 2020-01-09 NOTE — Progress Notes (Incomplete)
HEMATOLOGY/ONCOLOGY CLINIC NOTE  Date of Service: 01/09/2020  Patient Care Team: Georgianne Fick, MD as PCP - General (Internal Medicine)  CHIEF COMPLAINTS/PURPOSE OF CONSULTATION:  Retroperitoneal Lymphadenopathy  HISTORY OF PRESENTING ILLNESS:  James Downs is a wonderful 54 y.o. male who has been referred to [place] by Dr. Elnoria Howard for evaluation and management of Retroperitoneal Lymphadenopathy. The pt reports that he is doing well overall.   The pt reports that he was recently in the hospital due to left-sided flank pain that traveled into his left leg. A few years ago pt had a pinched nerve in his back and the discomfort in his leg was similar. Pt has a history of kidney stones and was discovered to have stones while in the hospital. These appeared to be the source of his discomfort. His lymphadenopathy was an incidental finding on a CT scan.    Pt last felt like himself 3-4 months ago. He has recently been experiencing stress due to having to assist his family with their various medical issues. He has also noticed an increase in fatigue over the last few months. Pt is unaware of any COPD diagnosis. Pt denies current testicular swelling or pain, but notes that he does experience this occasionally when he has kidney stones. He drinks about 48 oz of dark sodas per day and was told to avoid dairy products to help prevent new kidney stones.  Of note prior to the patient's visit today, pt has had CT Abd/Pel ([id number]) completed on 08/09/2019 with results revealing "1. Numerous and borderline enlarged retroperitoneal lymph nodes with some faint retroperitoneal hazy stranding. Such an appearance could be seen as an early stage of retroperitoneal fibrosis, early features of a lymphoproliferative disorder, or potentially a reactive process. Recommend close clinical assessment for features of the above differential considerations and consider further evaluation with outpatient PET-CT. 2. Mild  thickening versus underdistention of the distal thoracic esophagus, correlate for symptoms of esophagitis. 3. Bilateral nonobstructing nephrolithiasis. 4. Circumferential bladder wall thickening, possibly related to underdistention. Though given concomitant mild nonspecific perinephric stranding correlation with urinalysis to exclude cystitis and ascending tract infection. 5. Nodular hepatic surface contour, suggestive of cirrhosis. Correlate with history and serologies if felt to be clinically appropriate. 6. Aortic Atherosclerosis (ICD10-I70.0)."   Most recent lab results (08/16/2019) of CBC & CMP is as follows: all values are WNL except for Glucose at 102, Creatinine at 1.47, GFR Est Non Af Am at 53.  On review of systems, pt reports abdominal pain, fatigue  and denies fevers, chills, night sweats, unexpected weight loss, skin rashes, new lumps/bumps, dysuria, difficulty passing urine, constipation, testicular pain/swelling and any other symptoms.   On PMHx the pt reports Kidney stone, Posterior Lumbar Fusion 4 level. On Social Hx the pt reports he is currently smoking 1 ppd.  On FHx the pt reports that his his father had Prostate Cancer and passed from Brain Cancer and his paternal grandfather had Pancreatic Cancer.   INTERVAL HISTORY: James Downs is a wonderful 54 y.o. male who is here for evaluation and management of Retroperitoneal Lymphadenopathy. The patient's last visit with [place] was on 09/05/2019. The pt reports that he is doing well overall.  The pt reports ***  Of note since the patient's last visit, pt has had *** completed on *** with results revealing ***.  Lab results today (01/09/20) of CBC w/diff and CMP is as follows: all values are WNL except for ***.  On review of systems, pt reports ***  and denies *** and any other symptoms.   A&P: -Discussed pt labwork today, 01/09/20; *** -***  MEDICAL HISTORY:  Past Medical History:  Diagnosis Date  . Arthritis   .  Depression   . Gout   . Hypercholesterolemia   . Hypertension    on meds  . Kidney damage   . Kidney stone   . Obesity   . Sleep apnea    uses CPAP    SURGICAL HISTORY: Past Surgical History:  Procedure Laterality Date  . FOOT SURGERY Right 1991  . HAND SURGERY Right 1985  . POSTERIOR LUMBAR FUSION 4 LEVEL N/A 11/29/2013   Procedure: Thoracic Eight, Thoracic Nine, Costotransversectomy   Thoracic Seven-Eight Thoracic Eight-Nine diskectomy;  Surgeon: Lisbeth Renshaw, MD;  Location: [facility];  Service: Neurosurgery;  Laterality: N/A;  Thoracic Eight, Thoracic Nine, Costotransversectomy   Thoracic Seven-Eight Thoracic Eight-Nine diskectomy    SOCIAL HISTORY: Social History   Socioeconomic History  . Marital status: Married    Spouse name: Not on file  . Number of children: 2  . Years of education: Not on file  . Highest education level: Not on file  Occupational History  . Not on file  Tobacco Use  . Smoking status: Current Every Day Smoker    Packs/day: 1.00    Years: 30.00    Pack years: 30.00    Types: Cigarettes  . Smokeless tobacco: Former Clinical biochemist  . Vaping Use: Never used  Substance and Sexual Activity  . Alcohol use: No  . Drug use: No  . Sexual activity: Not on file  Other Topics Concern  . Not on file  Social History Narrative   Patient is married with 2 children.   Patient is right handed.   Patient has high school education.   Patient drinks 3-4 16 oz daily.   Social Determinants of Health   Financial Resource Strain:   . Difficulty of Paying Living Expenses: Not on file  Food Insecurity:   . Worried About Programme researcher, broadcasting/film/video in the Last Year: Not on file  . Ran Out of Food in the Last Year: Not on file  Transportation Needs:   . Lack of Transportation (Medical): Not on file  . Lack of Transportation (Non-Medical): Not on file  Physical Activity:   . Days of Exercise per Week: Not on file  . Minutes of Exercise per Session: Not on  file  Stress:   . Feeling of Stress : Not on file  Social Connections:   . Frequency of Communication with Friends and Family: Not on file  . Frequency of Social Gatherings with Friends and Family: Not on file  . Attends Religious Services: Not on file  . Active Member of Clubs or Organizations: Not on file  . Attends Banker Meetings: Not on file  . Marital Status: Not on file  Intimate Partner Violence:   . Fear of Current or Ex-Partner: Not on file  . Emotionally Abused: Not on file  . Physically Abused: Not on file  . Sexually Abused: Not on file    FAMILY HISTORY: Family History  Problem Relation Age of Onset  . Cancer Father        BRAIN/PROSTATE/SKIN  . Hypertension Father   . Cancer Paternal Grandfather   . Heart failure Maternal Grandmother   . Diabetes Maternal Grandmother   . Hypertension Maternal Grandmother   . Hypertension Maternal Grandfather     ALLERGIES:  has No Known  Allergies.  MEDICATIONS:  Current Outpatient Medications  Medication Sig Dispense Refill  . allopurinol (ZYLOPRIM) 100 MG tablet Take 50 mg by mouth daily.    . hydrocortisone (ANUSOL-HC) 25 MG suppository Place 1 suppository (25 mg total) rectally 2 (two) times daily. 12 suppository 0  . ibuprofen (ADVIL) 600 MG tablet Take 1 tablet (600 mg total) by mouth every 6 (six) hours as needed. 30 tablet 0  . MITIGARE 0.6 MG CAPS Take 1 capsule by mouth daily.    Marland Kitchen morphine (MSIR) 15 MG tablet Take 0.5 tablets (7.5 mg total) by mouth every 12 (twelve) hours as needed for severe pain. 4 tablet 0  . ondansetron (ZOFRAN ODT) 4 MG disintegrating tablet Take 1 tablet (4 mg total) by mouth every 8 (eight) hours as needed for nausea or vomiting. 8 tablet 0  . PARoxetine (PAXIL) 40 MG tablet TK 1 T PO QD IN THE MORNING    . telmisartan (MICARDIS) 20 MG tablet Take 1 tablet (20 mg total) by mouth daily. 5 tablet 0   No current facility-administered medications for this visit.    REVIEW OF  SYSTEMS:   A 10+ POINT REVIEW OF SYSTEMS WAS OBTAINED including neurology, dermatology, psychiatry, cardiac, respiratory, lymph, extremities, GI, GU, Musculoskeletal, constitutional, breasts, reproductive, HEENT.  All pertinent positives are noted in the HPI.  All others are negative.   PHYSICAL EXAMINATION: ECOG PERFORMANCE STATUS: 1 - Symptomatic but completely ambulatory  *** GENERAL:alert, in no acute distress and comfortable SKIN: no acute rashes, no significant lesions EYES: conjunctiva are pink and non-injected, sclera anicteric OROPHARYNX: MMM, no exudates, no oropharyngeal erythema or ulceration NECK: supple, no JVD LYMPH:  no palpable lymphadenopathy in the cervical, axillary or inguinal regions LUNGS: clear to auscultation b/l with normal respiratory effort HEART: regular rate & rhythm ABDOMEN:  normoactive bowel sounds , non tender, not distended. No palpable hepatosplenomegaly.  Extremity: no pedal edema PSYCH: alert & oriented x 3 with fluent speech NEURO: no focal motor/sensory deficits  LABORATORY DATA:  I have reviewed the data as listed  . CBC Latest Ref Rng & Units 08/22/2019 08/16/2019 08/09/2019  WBC 4.0 - 10.5 K/uL 8.8 8.6 7.2  Hemoglobin 13.0 - 17.0 g/dL 81.1 91.4 78.2  Hematocrit 39 - 52 % 46.8 46.5 43.4  Platelets 150 - 400 K/uL 211 197 177    . CMP Latest Ref Rng & Units 08/22/2019 08/16/2019 08/09/2019  Glucose 70 - 99 mg/dL 956(O) 130(Q) 657(Q)  BUN 6 - 20 mg/dL 11 12 11   Creatinine 0.61 - 1.24 mg/dL ) 4.69(G) 2.95(M)  Sodium 135 - 145 mmol/L 140 140 141  Potassium 3.5 - 5.1 mmol/L 4.0 4.0 4.7  Chloride 98 - 111 mmol/L 107 107 113(H)  CO2 22 - 32 mmol/L 22 24 22   Calcium 8.9 - 10.3 mg/dL 9.8 9.1 8.9  Total Protein 6.5 - 8.1 g/dL 7.8 7.7 6.6  Total Bilirubin 0.3 - 1.2 mg/dL 0.7 0.6 0.5  Alkaline Phos 38 - 126 U/L 91 76 70  AST 15 - 41 U/L 22 23 21   ALT 0 - 44 U/L 25 22 19      RADIOGRAPHIC STUDIES: I have personally reviewed the radiological  images as listed and agreed with the findings in the report. No results found.  ASSESSMENT & PLAN:   54 yo with   1) Retroperitoneal Lymphadenopathy  08/09/2019 CT Abd/Pel ( ) revealed "1. Numerous and borderline enlarged retroperitoneal lymph nodes with some faint retroperitoneal hazy stranding. Such an appearance could be seen  as an early stage of retroperitoneal fibrosis, early features of a lymphoproliferative disorder, or potentially a reactive process." 08/30/2019 PET/CT ([id number]) revealed "1. Signs of cirrhosis and portal hypertension with mildly enlarged lymph nodes in the upper abdomen displaying mild increased FDG uptake slightly above background liver for the largest lymph node in the porta hepatis. Findings are likely related to chronic liver disease.  2. Retroperitoneal lymph nodes do not display uptake above blood pool activity or liver activity. 3. Symmetric testicular uptake nonspecific but slightly above expected. Correlate with any symptoms in this area."  PLAN: *** -Advised pt that abdominal lymph nodes to not appear very active or large enough to biopsy at this time. -Advised pt that lymphadenopathy could be caused by low-level chronic inflammation, likely from kidney stones.  -Will refer to Urology for kidney stones & testicular FDG avidity    FOLLOW UP: ***  The total time spent in the appt was *** minutes and more than 50% was on counseling and direct patient cares.  All of the patient's questions were answered with apparent satisfaction. The patient knows to call the clinic with any problems, questions or concerns.    Wyvonnia LoraGautam Kale MD MS AAHIVMS [facility] Hematology/Oncology Physician [facility]  (Office):       [phone] (Work cell):  [phone] (Fax):           [phone]  01/09/2020 10:01 AM  I, Carollee HerterJazzmine Knight, am acting as a scribe for Dr. Wyvonnia LoraGautam Kale.   {Add Production assistant, radiocribe Attestation Statement}

## 2020-01-16 ENCOUNTER — Telehealth: Payer: Self-pay | Admitting: *Deleted

## 2020-01-16 NOTE — Telephone Encounter (Signed)
Patient missed appt with Dr. Candise Che on 11/15. He did not have PET or Ultrasound as ordered July 2021. Schedule message sent to r/s at patient's convenience

## 2020-02-21 ENCOUNTER — Other Ambulatory Visit: Payer: Self-pay

## 2020-02-21 ENCOUNTER — Encounter (HOSPITAL_BASED_OUTPATIENT_CLINIC_OR_DEPARTMENT_OTHER): Payer: Self-pay | Admitting: *Deleted

## 2020-02-21 DIAGNOSIS — F17210 Nicotine dependence, cigarettes, uncomplicated: Secondary | ICD-10-CM | POA: Insufficient documentation

## 2020-02-21 DIAGNOSIS — J069 Acute upper respiratory infection, unspecified: Secondary | ICD-10-CM | POA: Insufficient documentation

## 2020-02-21 DIAGNOSIS — I10 Essential (primary) hypertension: Secondary | ICD-10-CM | POA: Insufficient documentation

## 2020-02-21 DIAGNOSIS — M791 Myalgia, unspecified site: Secondary | ICD-10-CM | POA: Insufficient documentation

## 2020-02-21 DIAGNOSIS — R0602 Shortness of breath: Secondary | ICD-10-CM | POA: Insufficient documentation

## 2020-02-21 DIAGNOSIS — Z20822 Contact with and (suspected) exposure to covid-19: Secondary | ICD-10-CM | POA: Insufficient documentation

## 2020-02-21 NOTE — ED Triage Notes (Signed)
Headache, cough, body aches, sneezing, runny nose, sob, chills and possible fever x 2 days.

## 2020-02-22 ENCOUNTER — Emergency Department (HOSPITAL_BASED_OUTPATIENT_CLINIC_OR_DEPARTMENT_OTHER)
Admission: EM | Admit: 2020-02-22 | Discharge: 2020-02-22 | Disposition: A | Discharge status: Home / Self Care | Payer: Self-pay | Attending: Emergency Medicine | Admitting: Emergency Medicine

## 2020-02-22 ENCOUNTER — Emergency Department (HOSPITAL_BASED_OUTPATIENT_CLINIC_OR_DEPARTMENT_OTHER): Payer: Self-pay

## 2020-02-22 DIAGNOSIS — Z20822 Contact with and (suspected) exposure to covid-19: Secondary | ICD-10-CM

## 2020-02-22 LAB — SARS CORONAVIRUS 2 (TAT 6-24 HRS): SARS Coronavirus 2: NEGATIVE

## 2020-02-22 MED ORDER — ALBUTEROL SULFATE HFA 108 (90 BASE) MCG/ACT IN AERS
1.0000 | INHALATION_SPRAY | Freq: Four times a day (QID) | RESPIRATORY_TRACT | 0 refills | Status: DC | PRN
Start: 2020-02-22 — End: 2023-11-23

## 2020-02-22 MED ORDER — PREDNISONE 20 MG PO TABS: 20.0000 mg | ORAL_TABLET | Freq: Every day | ORAL | 0 refills | Status: AC

## 2020-02-22 MED ORDER — BENZONATATE 100 MG PO CAPS: 100.0000 mg | ORAL_CAPSULE | Freq: Three times a day (TID) | ORAL | 0 refills | Status: DC | PRN

## 2020-02-22 NOTE — Discharge Instructions (Signed)
You were seen in the emergency department today with COVID-19-like symptoms.  I am calling in several prescriptions to your pharmacy to help with symptoms.  The Covid test will come back in the next 6 to 24 hours and can be seen on the MyChart app which you can download onto your phone.  There is information in the discharge paperwork about how to do this.  Worsening symptoms, shortness of breath, chest pain, other severe symptoms please return to the emergency department for reevaluation.

## 2020-02-22 NOTE — ED Provider Notes (Signed)
Emergency Department Provider Note   I have reviewed the triage vital signs and the nursing notes.   HISTORY  Chief Complaint URI   HPI James Downs is a 54 y.o. male with past medical history reviewed below presents to the emergency department with  headache, cough, body aches, runny nose, shortness of breath, chills.  Patient reports possible/subjective fever over the past 2 days which is when other symptoms developed as well.  Denies any associated chest pain.  No heart palpitations.  No radiation of symptoms or other modifying factors. No abdominal or back pain. No radiation of symptoms or modifying factors.    Past Medical History:  Diagnosis Date  . Arthritis   . Depression   . Gout   . Hypercholesterolemia   . Hypertension    on meds  . Kidney damage   . Kidney stone   . Obesity   . Sleep apnea    uses CPAP    Patient Active Problem List   Diagnosis Date Noted  . 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 Weakness of left leg 11/04/2013  . Paresthesias 11/04/2013  . Lumbar radiculopathy 11/04/2013  . Degenerative disc disease, lumbar 11/04/2013  . ONYCHOMYCOSIS 12/23/2006  . HYPERLIPIDEMIA 12/23/2006  . ANXIETY DISORDER, GENERALIZED 12/23/2006  . TOBACCO ABUSE 12/23/2006  . SYNDROME, CARPAL TUNNEL 12/23/2006  . HYPERTENSION 12/23/2006  . NEPHROLITHIASIS, HX OF 12/23/2006    Past Surgical History:  Procedure Laterality Date  . FOOT SURGERY Right 1991  . HAND SURGERY Right 1985  . POSTERIOR LUMBAR FUSION 4 LEVEL N/A 11/29/2013   Procedure: Thoracic Eight, Thoracic Nine, Costotransversectomy   Thoracic Seven-Eight Thoracic Eight-Nine diskectomy;  Surgeon: Lisbeth Renshaw, MD;  Location: [facility];  Service: Neurosurgery;  Laterality: N/A;  Thoracic Eight, Thoracic Nine, Costotransversectomy   Thoracic Seven-Eight Thoracic Eight-Nine diskectomy    Allergies Patient has no known allergies.  Family History   Problem Relation Age of Onset  . Cancer Father        BRAIN/PROSTATE/SKIN  . Hypertension Father   . Cancer Paternal Grandfather   . Heart failure Maternal Grandmother   . Diabetes Maternal Grandmother   . Hypertension Maternal Grandmother   . Hypertension Maternal Grandfather     Social History Social History   Tobacco Use  . Smoking status: Current Every Day Smoker    Packs/day: 1.00    Years: 30.00    Pack years: 30.00    Types: Cigarettes  . Smokeless tobacco: Former Clinical biochemist  . Vaping Use: Never used  Substance Use Topics  . Alcohol use: No  . Drug use: No    Review of Systems  Constitutional: Positive subjective fever/chills.  Eyes: No visual changes. ENT: No sore throat. Positive runny nose.  Cardiovascular: Denies chest pain. Respiratory: Positive shortness of breath. Positive cough.  Gastrointestinal: No abdominal pain.  No nausea, no vomiting.  No diarrhea.  No constipation. Genitourinary: Negative for dysuria. Musculoskeletal: Negative for back pain. Skin: Negative for rash. Neurological: Negative for focal weakness or numbness. Positive HA.   10-point ROS otherwise negative.  ____________________________________________   PHYSICAL EXAM:  VITAL SIGNS: ED Triage Vitals  Enc Vitals Group     BP 02/21/20 2105 (!) 170/103     Pulse Rate 02/21/20 2105 81     Resp 02/21/20 2105 20     Temp 02/21/20 2105 98.8 F (37.1 C)     Temp Source 02/21/20 2105 Oral  SpO2 02/21/20 2105 100 %     Weight 02/21/20 2103 225 lb (102.1 kg)     Height 02/21/20 2103 5\' 7"  (1.702 m)   Constitutional: Alert and oriented. Well appearing and in no acute distress. Eyes: Conjunctivae are normal.  Head: Atraumatic. Nose: No congestion/rhinnorhea. Mouth/Throat: Mucous membranes are moist.   Neck: No stridor.  No meningeal signs.   Cardiovascular: Normal rate, regular rhythm. Good peripheral circulation. Grossly normal heart sounds.   Respiratory: Normal  respiratory effort.  No retractions. Lungs CTAB. Gastrointestinal: Soft and nontender. No distention.  Musculoskeletal: No lower extremity tenderness nor edema. No gross deformities of extremities. Neurologic:  Normal speech and language. No gross focal neurologic deficits are appreciated.  Skin:  Skin is warm, dry and intact. No rash noted.  ____________________________________________   LABS (all labs ordered are listed, but only abnormal results are displayed)  Labs Reviewed  SARS CORONAVIRUS 2 (TAT 6-24 HRS)   ____________________________________________  RADIOLOGY  CXR reviewed ____________________________________________   PROCEDURES  Procedure(s) performed:   Procedures  None  ____________________________________________   INITIAL IMPRESSION / ASSESSMENT AND PLAN / ED COURSE  Pertinent labs & imaging results that were available during my care of the patient were reviewed by me and considered in my medical decision making (see chart for details).   Patient presents to the emergency department for evaluation of upper respiratory infection symptoms.  Patient is afebrile here with normal oxygen saturation.  Doubt sepsis, PE, ACS.  Plan to send Covid PCR testing which will come back in the next 6 to 24 hours and the patient will follow the test results in the MyChart app.  Chest x-ray reviewed with no infiltrate or other acute process.  Plan for symptom management at home.  Patient symptoms are primarily respiratory.  No other clear source for possible underlying infection.  Discussed quarantine and close PCP follow-up.   ____________________________________________  FINAL CLINICAL IMPRESSION(S) / ED DIAGNOSES  Final diagnoses:  Suspected COVID-19 virus infection    NEW OUTPATIENT MEDICATIONS STARTED DURING THIS VISIT:  Discharge Medication List as of 02/22/2020  2:46 AM    START taking these medications   Details  albuterol (VENTOLIN HFA) 108 (90 Base) MCG/ACT  inhaler Inhale 1-2 puffs into the lungs every 6 (six) hours as needed for wheezing or shortness of breath., Starting Wed 02/22/2020, Normal    benzonatate (TESSALON) 100 MG capsule Take 1 capsule (100 mg total) by mouth 3 (three) times daily as needed for cough., Starting Wed 02/22/2020, Normal    predniSONE (DELTASONE) 20 MG tablet Take 1 tablet (20 mg total) by mouth daily for 5 days., Starting Wed 02/22/2020, Until Mon 02/27/2020, Normal        Note:  This document was prepared using Dragon voice recognition software and may include unintentional dictation errors.  04/26/2020, MD, [facility]    Bryam Taborda, [facility], MD 02/28/20 [phone]

## 2020-03-07 ENCOUNTER — Emergency Department (HOSPITAL_BASED_OUTPATIENT_CLINIC_OR_DEPARTMENT_OTHER)
Admission: EM | Admit: 2020-03-07 | Discharge: 2020-03-07 | Disposition: A | Discharge status: Home / Self Care | Payer: Self-pay | Attending: Emergency Medicine | Admitting: Emergency Medicine

## 2020-03-07 ENCOUNTER — Encounter (HOSPITAL_BASED_OUTPATIENT_CLINIC_OR_DEPARTMENT_OTHER): Payer: Self-pay

## 2020-03-07 ENCOUNTER — Other Ambulatory Visit: Payer: Self-pay

## 2020-03-07 ENCOUNTER — Emergency Department (HOSPITAL_BASED_OUTPATIENT_CLINIC_OR_DEPARTMENT_OTHER): Payer: Self-pay

## 2020-03-07 DIAGNOSIS — M5442 Lumbago with sciatica, left side: Secondary | ICD-10-CM | POA: Insufficient documentation

## 2020-03-07 DIAGNOSIS — Z79899 Other long term (current) drug therapy: Secondary | ICD-10-CM | POA: Insufficient documentation

## 2020-03-07 DIAGNOSIS — I10 Essential (primary) hypertension: Secondary | ICD-10-CM | POA: Insufficient documentation

## 2020-03-07 DIAGNOSIS — F17210 Nicotine dependence, cigarettes, uncomplicated: Secondary | ICD-10-CM | POA: Insufficient documentation

## 2020-03-07 DIAGNOSIS — R1032 Left lower quadrant pain: Secondary | ICD-10-CM | POA: Insufficient documentation

## 2020-03-07 MED ORDER — METHYLPREDNISOLONE 4 MG PO TBPK: ORAL_TABLET | ORAL | 0 refills | Status: DC

## 2020-03-07 MED ORDER — KETOROLAC TROMETHAMINE 60 MG/2ML IM SOLN
60.0000 mg | Freq: Once | INTRAMUSCULAR | Status: AC
  Administered 2020-03-07: 60 mg via INTRAMUSCULAR
  Filled 2020-03-07: qty 2

## 2020-03-07 NOTE — ED Triage Notes (Signed)
Pt reports that he has L lower back pain that radiates down to L hip and L upper leg. Pt reports started x1 week ago. Pt denies any know injury. Pt ambulatory and in NAD.

## 2020-03-07 NOTE — ED Provider Notes (Signed)
[facility] EMERGENCY DEPARTMENT Provider Note   CSN: [id number] Arrival date & time: 03/07/20  1159     History Chief Complaint  Patient presents with  . Back Pain    James Downs is a 55 y.o. male.  The history is provided by the patient and medical records.  Back Pain  James Downs is a 55 y.o. male who presents to the Emergency Department complaining of pinched nerve in my back.  Sxs started about one week ago.  Pain is located in the left low back, wraps around the hip and goes to the anterior left knee.  Pain is better with sitting, worse with standing (but takes a while to worsen).  Pain is described as a heavy ache. Has been taking tylenol with mild relief in sxs.  Works as a Child psychotherapist, requires heavy lifting.    Denies fever.  Had night sweats two nights ago.  Has mild LLQ/suprapubic discomfort.    No dysuria, no change in quality of urine, no incontinence.  No diarrhea/constipation.  Feels weaker globally in general.    No known injuries.  On christmas eve he slept in a recliner and he fell three times to the floor that night.    Has a hx/o HTN, gout.  Has a hx/o kidney stones - this is different.    Denies IV drug use.  No hx/o blood stream infection or recent dental work.    Has not been vaccinated for COVID 19.      Past Medical History:  Diagnosis Date  . Arthritis   . Depression   . Gout   . Hypercholesterolemia   . Hypertension    on meds  . Kidney damage   . Kidney stone   . Obesity   . Sleep apnea    uses CPAP    Patient Active Problem List   Diagnosis Date Noted  . 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 Weakness of left leg 11/04/2013  . Paresthesias 11/04/2013  . Lumbar radiculopathy 11/04/2013  . Degenerative disc disease, lumbar 11/04/2013  . ONYCHOMYCOSIS 12/23/2006  . HYPERLIPIDEMIA 12/23/2006  . ANXIETY DISORDER, GENERALIZED 12/23/2006  . TOBACCO ABUSE 12/23/2006  .  SYNDROME, CARPAL TUNNEL 12/23/2006  . HYPERTENSION 12/23/2006  . NEPHROLITHIASIS, HX OF 12/23/2006    Past Surgical History:  Procedure Laterality Date  . FOOT SURGERY Right 1991  . HAND SURGERY Right 1985  . POSTERIOR LUMBAR FUSION 4 LEVEL N/A 11/29/2013   Procedure: Thoracic Eight, Thoracic Nine, Costotransversectomy   Thoracic Seven-Eight Thoracic Eight-Nine diskectomy;  Surgeon: Lisbeth Renshaw, MD;  Location: [facility];  Service: Neurosurgery;  Laterality: N/A;  Thoracic Eight, Thoracic Nine, Costotransversectomy   Thoracic Seven-Eight Thoracic Eight-Nine diskectomy       Family History  Problem Relation Age of Onset  . Cancer Father        BRAIN/PROSTATE/SKIN  . Hypertension Father   . Cancer Paternal Grandfather   . Heart failure Maternal Grandmother   . Diabetes Maternal Grandmother   . Hypertension Maternal Grandmother   . Hypertension Maternal Grandfather     Social History   Tobacco Use  . Smoking status: Current Every Day Smoker    Packs/day: 1.00    Years: 30.00    Pack years: 30.00    Types: Cigarettes  . Smokeless tobacco: Former Clinical biochemist  . Vaping Use: Never used  Substance Use Topics  . Alcohol use: No  . Drug use: No  Home Medications Prior to Admission medications   Medication Sig Start Date End Date Taking? Authorizing Provider  methylPREDNISolone (MEDROL DOSEPAK) 4 MG TBPK tablet Take according to label instructions 03/07/20  Yes Tilden Fossa, MD  albuterol (VENTOLIN HFA) 108 (90 Base) MCG/ACT inhaler Inhale 1-2 puffs into the lungs every 6 (six) hours as needed for wheezing or shortness of breath. 02/22/20   Long, Arlyss Repress, MD  allopurinol (ZYLOPRIM) 100 MG tablet Take 50 mg by mouth daily. 05/04/19   [provider]  benzonatate (TESSALON) 100 MG capsule Take 1 capsule (100 mg total) by mouth 3 (three) times daily as needed for cough. 02/22/20   Long, Arlyss Repress, MD  hydrocortisone (ANUSOL-HC) 25 MG suppository Place 1  suppository (25 mg total) rectally 2 (two) times daily. 08/09/19   Bethel Born, PA-C  ibuprofen (ADVIL) 600 MG tablet Take 1 tablet (600 mg total) by mouth every 6 (six) hours as needed. 06/17/19   Jacalyn Lefevre, MD  MITIGARE 0.6 MG CAPS Take 1 capsule by mouth daily. 05/04/19   [provider]  morphine (MSIR) 15 MG tablet Take 0.5 tablets (7.5 mg total) by mouth every 12 (twelve) hours as needed for severe pain. 08/16/19   Couture, Cortni S, PA-C  ondansetron (ZOFRAN ODT) 4 MG disintegrating tablet Take 1 tablet (4 mg total) by mouth every 8 (eight) hours as needed for nausea or vomiting. 08/09/19   Bethel Born, PA-C  PARoxetine (PAXIL) 40 MG tablet TK 1 T PO QD IN THE MORNING 03/15/18   [provider]  telmisartan (MICARDIS) 20 MG tablet Take 1 tablet (20 mg total) by mouth daily. 04/26/17   Dartha Lodge, PA-C    Allergies    Patient has no known allergies.  Review of Systems   Review of Systems  Musculoskeletal: Positive for back pain.  All other systems reviewed and are negative.   Physical Exam Updated Vital Signs BP (!) 151/107 (BP Location: Right Arm)   Pulse 72   Temp 97.7 F (36.5 C) (Oral)   Resp 18   Ht 5\' 8"  (1.727 m)   Wt 101.2 kg   SpO2 97%   BMI 33.91 kg/m   Physical Exam Vitals and nursing note reviewed.  Constitutional:      Appearance: He is well-developed and well-nourished.  HENT:     Head: Normocephalic and atraumatic.  Cardiovascular:     Rate and Rhythm: Normal rate and regular rhythm.     Heart sounds: No murmur heard.   Pulmonary:     Effort: Pulmonary effort is normal. No respiratory distress.     Breath sounds: Normal breath sounds.  Abdominal:     Palpations: Abdomen is soft.     Tenderness: There is no abdominal tenderness. There is no guarding or rebound.  Musculoskeletal:        General: No swelling, tenderness or edema.     Comments: 2+ DP pulses bilaterally  Skin:    General: Skin is warm and dry.   Neurological:     Mental Status: He is alert and oriented to person, place, and time.     Comments: 5/5 strength in all four extremities with sensation to light touch intact in all four extremities.    Psychiatric:        Mood and Affect: Mood and affect normal.        Behavior: Behavior normal.     ED Results / Procedures / Treatments   Labs (all labs ordered  are listed, but only abnormal results are displayed) Labs Reviewed - No data to display  EKG None  Radiology DG Lumbar Spine Complete  Result Date: 03/07/2020 CLINICAL DATA:  Back pain EXAM: LUMBAR SPINE - COMPLETE 4+ VIEW COMPARISON:  04/26/2017, CT 08/09/2019 FINDINGS: Trace retrolisthesis L2 on L3. Vertebral body heights are maintained. Mild disc space narrowing and degenerative changes at L2-L3 and L4-L5. Facet degenerative changes of the lower lumbar spine. Multiple bilateral kidney stones. IMPRESSION: 1. Mild degenerative changes. No acute osseous abnormality. 2. Multiple bilateral kidney stones. Electronically Signed   By: Jasmine Pang M.D.   On: 03/07/2020 16:04    Procedures Procedures (including critical care time)  Medications Ordered in ED Medications  ketorolac (TORADOL) injection 60 mg (60 mg Intramuscular Given 03/07/20 1603)    ED Course  I have reviewed the triage vital signs and the nursing notes.  Pertinent labs & imaging results that were available during my care of the patient were reviewed by me and considered in my medical decision making (see chart for details).    MDM Rules/Calculators/A&P                         patient here for evaluation of atraumatic left-sided low back pain that radiates to his left knee. He is neurologically and vascularly the intact on examination. Presentation is not consistent with cauda equina, epidural abscess, renal colic, diverticulitis. He is feeling improved after Toradol in the emergency department. Discussed with patient homecare for sciatica. Discussed  outpatient follow-up and return precautions.  Final Clinical Impression(s) / ED Diagnoses Final diagnoses:  Acute left-sided low back pain with left-sided sciatica    Rx / DC Orders ED Discharge Orders         Ordered    methylPREDNISolone (MEDROL DOSEPAK) 4 MG TBPK tablet        03/07/20 1540           Tilden Fossa, MD 03/07/20 [phone]

## 2020-03-07 NOTE — ED Notes (Signed)
Patient transported to X-ray 

## 2020-04-10 ENCOUNTER — Emergency Department (HOSPITAL_COMMUNITY): Payer: Managed Care, Other (non HMO)

## 2020-04-10 ENCOUNTER — Encounter (HOSPITAL_COMMUNITY): Payer: Self-pay | Admitting: Emergency Medicine

## 2020-04-10 ENCOUNTER — Emergency Department (HOSPITAL_COMMUNITY)
Admission: EM | Admit: 2020-04-10 | Discharge: 2020-04-10 | Disposition: A | Discharge status: Home / Self Care | Payer: Managed Care, Other (non HMO) | Attending: Emergency Medicine | Admitting: Emergency Medicine

## 2020-04-10 DIAGNOSIS — F17210 Nicotine dependence, cigarettes, uncomplicated: Secondary | ICD-10-CM | POA: Diagnosis not present

## 2020-04-10 DIAGNOSIS — Z79899 Other long term (current) drug therapy: Secondary | ICD-10-CM | POA: Insufficient documentation

## 2020-04-10 DIAGNOSIS — N200 Calculus of kidney: Secondary | ICD-10-CM | POA: Insufficient documentation

## 2020-04-10 DIAGNOSIS — I10 Essential (primary) hypertension: Secondary | ICD-10-CM | POA: Diagnosis not present

## 2020-04-10 DIAGNOSIS — R319 Hematuria, unspecified: Secondary | ICD-10-CM | POA: Diagnosis present

## 2020-04-10 LAB — URINALYSIS, ROUTINE W REFLEX MICROSCOPIC
Bilirubin Urine: NEGATIVE
Glucose, UA: NEGATIVE mg/dL
Ketones, ur: NEGATIVE mg/dL
Leukocytes,Ua: NEGATIVE
Nitrite: NEGATIVE
Protein, ur: 30 mg/dL — AB
RBC / HPF: >50 RBC/hpf — ABNORMAL HIGH (ref 0–5)
Specific Gravity, Urine: 1.012 (ref 1.005–1.030)
pH: 6 (ref 5.0–8.0)

## 2020-04-10 LAB — CBC
HCT: 48.1 % (ref 39.0–52.0)
Hemoglobin: 16.4 g/dL (ref 13.0–17.0)
MCH: 30.5 pg (ref 26.0–34.0)
MCHC: 34.1 g/dL (ref 30.0–36.0)
MCV: 89.6 fL (ref 80.0–100.0)
Platelets: 213 10*3/uL (ref 150–400)
RBC: 5.37 MIL/uL (ref 4.22–5.81)
RDW: 14.3 % (ref 11.5–15.5)
WBC: 9 10*3/uL (ref 4.0–10.5)
nRBC: 0 % (ref 0.0–0.2)

## 2020-04-10 LAB — CK: Total CK: 70 U/L (ref 49–397)

## 2020-04-10 LAB — BASIC METABOLIC PANEL
Anion gap: 9 (ref 5–15)
BUN: 8 mg/dL (ref 6–20)
CO2: 23 mmol/L (ref 22–32)
Calcium: 9.1 mg/dL (ref 8.9–10.3)
Chloride: 106 mmol/L (ref 98–111)
Creatinine, Ser: 1.19 mg/dL (ref 0.61–1.24)
GFR, Estimated: >60 mL/min (ref >60)
Glucose, Bld: 87 mg/dL (ref 70–99)
Potassium: 3.5 mmol/L (ref 3.5–5.1)
Sodium: 138 mmol/L (ref 135–145)

## 2020-04-10 MED ORDER — KETOROLAC TROMETHAMINE 30 MG/ML IJ SOLN
15.0000 mg | Freq: Once | INTRAMUSCULAR | Status: AC
  Administered 2020-04-10: 15 mg via INTRAVENOUS
  Filled 2020-04-10: qty 1

## 2020-04-10 MED ORDER — IBUPROFEN 400 MG PO TABS: 400.0000 mg | ORAL_TABLET | Freq: Three times a day (TID) | ORAL | 0 refills | Status: AC

## 2020-04-10 MED ORDER — ONDANSETRON HCL 4 MG/2ML IJ SOLN
4.0000 mg | Freq: Once | INTRAMUSCULAR | Status: AC
  Administered 2020-04-10: 4 mg via INTRAVENOUS
  Filled 2020-04-10: qty 2

## 2020-04-10 MED ORDER — SODIUM CHLORIDE 0.9 % IV BOLUS
1000.0000 mL | Freq: Once | INTRAVENOUS | Status: AC
  Administered 2020-04-10: 1000 mL via INTRAVENOUS

## 2020-04-10 MED ORDER — ONDANSETRON 4 MG PO TBDP: 4.0000 mg | ORAL_TABLET | Freq: Three times a day (TID) | ORAL | 0 refills | Status: DC | PRN

## 2020-04-10 MED ORDER — HYDROCODONE-ACETAMINOPHEN 5-325 MG PO TABS: 1.0000 | ORAL_TABLET | Freq: Four times a day (QID) | ORAL | 0 refills | Status: DC | PRN

## 2020-04-10 NOTE — Discharge Instructions (Signed)
As discussed, you have been diagnosed with kidney stones.  There is currently no evidence for infection.  If you develop new, or concerning changes, return here, otherwise please follow-up with our urology colleagues.

## 2020-04-10 NOTE — ED Provider Notes (Signed)
[facility]-EMERGENCY DEPT Provider Note   CSN: [id number] Arrival date & time: 04/10/20  1402     History Chief Complaint  Patient presents with  . Flank Pain  . Hematuria    James ParesRichard M Lucente is a 55 y.o. male.  HPI With multiple medical issues including prior kidney stones, and recent Covid illness now presents with left low back pain, hematuria. Onset was yesterday.  Since that time pain has been persistent, sore, not improved with OTC medication. There is associated nausea, but no diarrhea, he has had emesis. He notes that he was diagnosed with Covid about 10 days ago, was ill, substantially, but has improved, and states that he was substantially better, returning to work a few days ago, before this illness began yesterday. There is mild associated dysuria, with a hematuria, but no scrotal pain, swelling, no penis changes. Pain is focally in the left lower back. Pain is sore.    Past Medical History:  Diagnosis Date  . Arthritis   . Depression   . Gout   . Hypercholesterolemia   . Hypertension    on meds  . Kidney damage   . Kidney stone   . Obesity   . Sleep apnea    uses CPAP    Patient Active Problem List   Diagnosis Date Noted  . 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 Weakness of left leg 11/04/2013  . Paresthesias 11/04/2013  . Lumbar radiculopathy 11/04/2013  . Degenerative disc disease, lumbar 11/04/2013  . ONYCHOMYCOSIS 12/23/2006  . HYPERLIPIDEMIA 12/23/2006  . ANXIETY DISORDER, GENERALIZED 12/23/2006  . TOBACCO ABUSE 12/23/2006  . SYNDROME, CARPAL TUNNEL 12/23/2006  . HYPERTENSION 12/23/2006  . NEPHROLITHIASIS, HX OF 12/23/2006    Past Surgical History:  Procedure Laterality Date  . FOOT SURGERY Right 1991  . HAND SURGERY Right 1985  . POSTERIOR LUMBAR FUSION 4 LEVEL N/A 11/29/2013   Procedure: Thoracic Eight, Thoracic Nine, Costotransversectomy   Thoracic Seven-Eight Thoracic  Eight-Nine diskectomy;  Surgeon: Lisbeth RenshawNeelesh Nundkumar, MD;  Location: [facility];  Service: Neurosurgery;  Laterality: N/A;  Thoracic Eight, Thoracic Nine, Costotransversectomy   Thoracic Seven-Eight Thoracic Eight-Nine diskectomy       Family History  Problem Relation Age of Onset  . Cancer Father        BRAIN/PROSTATE/SKIN  . Hypertension Father   . Cancer Paternal Grandfather   . Heart failure Maternal Grandmother   . Diabetes Maternal Grandmother   . Hypertension Maternal Grandmother   . Hypertension Maternal Grandfather     Social History   Tobacco Use  . Smoking status: Current Every Day Smoker    Packs/day: 1.00    Years: 30.00    Pack years: 30.00    Types: Cigarettes  . Smokeless tobacco: Former Clinical biochemistUser  Vaping Use  . Vaping Use: Never used  Substance Use Topics  . Alcohol use: No  . Drug use: No    Home Medications Prior to Admission medications   Medication Sig Start Date End Date Taking? Authorizing Provider  acetaminophen (TYLENOL) 500 MG tablet Take 1,000 mg by mouth every 6 (six) hours as needed.   Yes [provider]  albuterol (VENTOLIN HFA) 108 (90 Base) MCG/ACT inhaler Inhale 1-2 puffs into the lungs every 6 (six) hours as needed for wheezing or shortness of breath. 02/22/20  Yes Long, Arlyss RepressJoshua G, MD  allopurinol (ZYLOPRIM) 100 MG tablet Take 100 mg by mouth daily. 05/04/19  Yes [provider]  benzonatate (TESSALON)  100 MG capsule Take 1 capsule (100 mg total) by mouth 3 (three) times daily as needed for cough. 02/22/20  Yes Long, Arlyss Repress, MD  HYDROcodone-acetaminophen (NORCO/VICODIN) 5-325 MG tablet Take 1 tablet by mouth every 6 (six) hours as needed for severe pain. 04/10/20  Yes Gerhard Munch, MD  ibuprofen (ADVIL) 400 MG tablet Take 1 tablet (400 mg total) by mouth 3 (three) times daily for 3 days. Take one tablet three times daily for three days 04/10/20 04/13/20 Yes Gerhard Munch, MD  ondansetron (ZOFRAN ODT) 4 MG disintegrating  tablet Take 1 tablet (4 mg total) by mouth every 8 (eight) hours as needed for nausea or vomiting. 08/09/19  Yes Bethel Born, PA-C  ondansetron (ZOFRAN ODT) 4 MG disintegrating tablet Take 1 tablet (4 mg total) by mouth every 8 (eight) hours as needed for nausea or vomiting. 04/10/20  Yes Gerhard Munch, MD  PARoxetine (PAXIL) 40 MG tablet Take 40 mg by mouth daily. 03/15/18  Yes [provider]  telmisartan (MICARDIS) 20 MG tablet Take 1 tablet (20 mg total) by mouth daily. 04/26/17  Yes Dartha Lodge, PA-C  hydrocortisone (ANUSOL-HC) 25 MG suppository Place 1 suppository (25 mg total) rectally 2 (two) times daily. Patient not taking: No sig reported 08/09/19   Bethel Born, PA-C  ibuprofen (ADVIL) 600 MG tablet Take 1 tablet (600 mg total) by mouth every 6 (six) hours as needed. Patient not taking: No sig reported 06/17/19   Jacalyn Lefevre, MD  methylPREDNISolone (MEDROL DOSEPAK) 4 MG TBPK tablet Take according to label instructions Patient not taking: No sig reported 03/07/20   Tilden Fossa, MD  morphine (MSIR) 15 MG tablet Take 0.5 tablets (7.5 mg total) by mouth every 12 (twelve) hours as needed for severe pain. Patient not taking: No sig reported 08/16/19   Couture, Cortni S, PA-C    Allergies    Other  Review of Systems   Review of Systems  Constitutional:       Per HPI, otherwise negative  HENT:       Per HPI, otherwise negative  Respiratory:       Per HPI, otherwise negative  Cardiovascular:       Per HPI, otherwise negative  Gastrointestinal: Negative for vomiting.  Endocrine:       Negative aside from HPI  Genitourinary:       Neg aside from HPI   Musculoskeletal:       Per HPI, otherwise negative  Skin: Negative.   Neurological: Negative for syncope.    Physical Exam Updated Vital Signs BP (!) 187/114   Pulse 73   Temp 98.2 F (36.8 C) (Oral)   Resp 15   SpO2 99%   Physical Exam Vitals and nursing note reviewed.  Constitutional:       General: He is not in acute distress.    Appearance: He is well-developed.  HENT:     Head: Normocephalic and atraumatic.  Eyes:     Extraocular Movements: EOM normal.     Conjunctiva/sclera: Conjunctivae normal.  Cardiovascular:     Rate and Rhythm: Normal rate and regular rhythm.  Pulmonary:     Effort: Pulmonary effort is normal. No respiratory distress.     Breath sounds: No stridor.  Abdominal:     General: There is no distension.  Musculoskeletal:        General: No edema.  Skin:    General: Skin is warm and dry.  Neurological:     Mental Status:  He is alert and oriented to person, place, and time.  Psychiatric:        Mood and Affect: Mood and affect normal.     ED Results / Procedures / Treatments   Labs (all labs ordered are listed, but only abnormal results are displayed) Labs Reviewed  URINALYSIS, ROUTINE W REFLEX MICROSCOPIC - Abnormal; Notable for the following components:      Result Value   Color, Urine AMBER (*)    APPearance HAZY (*)    Hgb urine dipstick MODERATE (*)    Protein, ur 30 (*)    RBC / HPF >50 (*)    Bacteria, UA RARE (*)    All other components within normal limits  BASIC METABOLIC PANEL  CBC  CK    EKG None  Radiology CT Renal Stone Study  Result Date: 04/10/2020 CLINICAL DATA:  Left flank pain, hematuria EXAM: CT ABDOMEN AND PELVIS WITHOUT CONTRAST TECHNIQUE: Multidetector CT imaging of the abdomen and pelvis was performed following the standard protocol without IV contrast. COMPARISON:  08/09/2019 FINDINGS: Lower chest: Peripheral opacities noted in both lower lungs, left greater than right. No effusions. Hepatobiliary: Changes of cirrhosis. No focal hepatic abnormality. Gallbladder unremarkable. Pancreas: No focal abnormality or ductal dilatation. Spleen: No focal abnormality.  Normal size. Adrenals/Urinary Tract: Numerous bilateral renal stones. Multiple distal right ureteral stones including a 3 mm stone and 7 x 3 mm stone. Mild  fullness of the right renal collecting system and ureter. No left ureteral stones or hydronephrosis. Adrenal glands and urinary bladder unremarkable. Stomach/Bowel: Stomach, large and small bowel grossly unremarkable. Normal appendix. Vascular/Lymphatic: Shotty retroperitoneal lymph nodes. Aortic atherosclerosis. No aneurysm. Reproductive: No visible focal abnormality. Other: No free fluid or free air. Musculoskeletal: No acute bony abnormality. IMPRESSION: Bilateral nephrolithiasis. At least 2 distal right ureteral stones with mild fullness of the right renal collecting system and ureter. Cirrhosis. Aortic atherosclerosis. Electronically Signed   By: Charlett Nose M.D.   On: 04/10/2020 16:49    Procedures Procedures   Medications Ordered in ED Medications  sodium chloride 0.9 % bolus 1,000 mL (0 mLs Intravenous Stopped 04/10/20 1723)  ketorolac (TORADOL) 30 MG/ML injection 15 mg (15 mg Intravenous Given 04/10/20 1553)  ondansetron (ZOFRAN) injection 4 mg (4 mg Intravenous Given 04/10/20 1554)    ED Course  I have reviewed the triage vital signs and the nursing notes.  Pertinent labs & imaging results that were available during my care of the patient were reviewed by me and considered in my medical decision making (see chart for details).  On repeat exam after initial analgesia provided patient notes that he feels better.   Update:, CT results not available, urinalysis, remaining labs available. CT reviewed, discussed, multiple distal ureteral stones, no evidence for substantial obstruction, and the patient continues to produce urine. Labs without leukocytosis, urinalysis without obvious infection, and patient has no fever suggesting bacteremia, sepsis, concurrent infection. Patient amenable to, appropriate to for follow-up as an outpatient given need for management of his kidney stones. Final Clinical Impression(s) / ED Diagnoses Final diagnoses:  Kidney stone    Rx / DC Orders ED  Discharge Orders         Ordered    ibuprofen (ADVIL) 400 MG tablet  3 times daily        04/10/20 2011    HYDROcodone-acetaminophen (NORCO/VICODIN) 5-325 MG tablet  Every 6 hours PRN        04/10/20 2011    ondansetron (ZOFRAN ODT) 4 MG  disintegrating tablet  Every 8 hours PRN        04/10/20 2011           Gerhard Munch, MD 04/10/20 2213

## 2020-04-10 NOTE — ED Triage Notes (Signed)
Per EMS-has been having blood in urine for a couple of hours-left flank pain-history of kidney stones--has not seen a urologist

## 2020-04-16 ENCOUNTER — Emergency Department (HOSPITAL_BASED_OUTPATIENT_CLINIC_OR_DEPARTMENT_OTHER): Payer: Managed Care, Other (non HMO)

## 2020-04-16 ENCOUNTER — Emergency Department (HOSPITAL_BASED_OUTPATIENT_CLINIC_OR_DEPARTMENT_OTHER)
Admission: EM | Admit: 2020-04-16 | Discharge: 2020-04-16 | Disposition: A | Discharge status: Home / Self Care | Payer: Managed Care, Other (non HMO) | Attending: Emergency Medicine | Admitting: Emergency Medicine

## 2020-04-16 ENCOUNTER — Other Ambulatory Visit: Payer: Self-pay

## 2020-04-16 ENCOUNTER — Encounter (HOSPITAL_BASED_OUTPATIENT_CLINIC_OR_DEPARTMENT_OTHER): Payer: Self-pay | Admitting: Emergency Medicine

## 2020-04-16 DIAGNOSIS — R109 Unspecified abdominal pain: Secondary | ICD-10-CM | POA: Diagnosis present

## 2020-04-16 DIAGNOSIS — F17210 Nicotine dependence, cigarettes, uncomplicated: Secondary | ICD-10-CM | POA: Insufficient documentation

## 2020-04-16 DIAGNOSIS — Z87442 Personal history of urinary calculi: Secondary | ICD-10-CM | POA: Insufficient documentation

## 2020-04-16 DIAGNOSIS — I10 Essential (primary) hypertension: Secondary | ICD-10-CM | POA: Insufficient documentation

## 2020-04-16 DIAGNOSIS — Z79899 Other long term (current) drug therapy: Secondary | ICD-10-CM | POA: Diagnosis not present

## 2020-04-16 LAB — CBC WITH DIFFERENTIAL/PLATELET
Abs Immature Granulocytes: 0.02 10*3/uL (ref 0.00–0.07)
Basophils Absolute: 0.1 10*3/uL (ref 0.0–0.1)
Basophils Relative: 1 %
Eosinophils Absolute: 0 10*3/uL (ref 0.0–0.5)
Eosinophils Relative: 0 %
HCT: 49 % (ref 39.0–52.0)
Hemoglobin: 16.9 g/dL (ref 13.0–17.0)
Immature Granulocytes: 0 %
Lymphocytes Relative: 20 %
Lymphs Abs: 1.8 10*3/uL (ref 0.7–4.0)
MCH: 30.8 pg (ref 26.0–34.0)
MCHC: 34.5 g/dL (ref 30.0–36.0)
MCV: 89.4 fL (ref 80.0–100.0)
Monocytes Absolute: 0.8 10*3/uL (ref 0.1–1.0)
Monocytes Relative: 9 %
Neutro Abs: 6.4 10*3/uL (ref 1.7–7.7)
Neutrophils Relative %: 70 %
Platelets: 183 10*3/uL (ref 150–400)
RBC: 5.48 MIL/uL (ref 4.22–5.81)
RDW: 15.1 % (ref 11.5–15.5)
WBC: 9 10*3/uL (ref 4.0–10.5)
nRBC: 0 % (ref 0.0–0.2)

## 2020-04-16 LAB — COMPREHENSIVE METABOLIC PANEL
ALT: 21 U/L (ref 0–44)
AST: 22 U/L (ref 15–41)
Albumin: 4.3 g/dL (ref 3.5–5.0)
Alkaline Phosphatase: 88 U/L (ref 38–126)
Anion gap: 10 (ref 5–15)
BUN: 15 mg/dL (ref 6–20)
CO2: 23 mmol/L (ref 22–32)
Calcium: 9.4 mg/dL (ref 8.9–10.3)
Chloride: 104 mmol/L (ref 98–111)
Creatinine, Ser: 1.39 mg/dL — ABNORMAL HIGH (ref 0.61–1.24)
GFR, Estimated: 59 mL/min — ABNORMAL LOW (ref >60)
Glucose, Bld: 98 mg/dL (ref 70–99)
Potassium: 3.7 mmol/L (ref 3.5–5.1)
Sodium: 137 mmol/L (ref 135–145)
Total Bilirubin: 0.5 mg/dL (ref 0.3–1.2)
Total Protein: 8.1 g/dL (ref 6.5–8.1)

## 2020-04-16 LAB — URINALYSIS, ROUTINE W REFLEX MICROSCOPIC
Bilirubin Urine: NEGATIVE
Glucose, UA: NEGATIVE mg/dL
Hgb urine dipstick: NEGATIVE
Ketones, ur: NEGATIVE mg/dL
Leukocytes,Ua: NEGATIVE
Nitrite: NEGATIVE
Protein, ur: NEGATIVE mg/dL
Specific Gravity, Urine: 1.03 (ref 1.005–1.030)
pH: 5 (ref 5.0–8.0)

## 2020-04-16 LAB — LIPASE, BLOOD: Lipase: 42 U/L (ref 11–51)

## 2020-04-16 MED ORDER — ONDANSETRON HCL 4 MG/2ML IJ SOLN
4.0000 mg | Freq: Once | INTRAMUSCULAR | Status: AC
  Administered 2020-04-16: 4 mg via INTRAVENOUS
  Filled 2020-04-16: qty 2

## 2020-04-16 MED ORDER — METHYLPREDNISOLONE 4 MG PO TBPK: ORAL_TABLET | ORAL | 0 refills | Status: DC

## 2020-04-16 MED ORDER — KETOROLAC TROMETHAMINE 30 MG/ML IJ SOLN
30.0000 mg | Freq: Once | INTRAMUSCULAR | Status: AC
  Administered 2020-04-16: 30 mg via INTRAVENOUS
  Filled 2020-04-16: qty 1

## 2020-04-16 NOTE — Discharge Instructions (Addendum)
Your work-up today was reassuring.  Suspect that it may be musculoskeletal.  Please take the prescribed Medrol Dosepak, as directed.  Please also continue with the NSAIDs and your already prescribed Zofran and Vicodin, as needed for breakthrough pain or nausea.  Follow-up with your primary care provider regarding today's encounter.  I would also like for you to follow-up with [facility] in [facility] for ongoing evaluation and management.  I recommend heating pads to the affected area.  Return to the ED or seek immediate medical attention to express any new worsening symptoms.

## 2020-04-16 NOTE — ED Triage Notes (Signed)
Left flank pain x 2 hrs, hx of stones. COVID+ in Jan, unsure of exact date

## 2020-04-16 NOTE — ED Provider Notes (Signed)
[facility] EMERGENCY DEPARTMENT Provider Note   CSN: [id number] Arrival date & time: 04/16/20  1350     History Chief Complaint  Patient presents with  . Flank Pain    James Downs is a 55 y.o. male with PMH of HTN, HLD, and nephrolithiasis who presents the ED for sudden onset left-sided flank pain beginning at 12 PM.  He endorses mild nausea, but no emesis.  He states that he was just seen at the emergency room last week and diagnosed with bilateral kidney stones.    I reviewed patient's medical record and he was seen on 04/10/2020 and referred to [facility] urology after he was diagnosed with 2 obstructing right-sided ureteric stones and hematuria.  There was no concern for infection and he was discharged home with Zofran, NSAIDs, and Vicodin.    Patient denies any significant right-sided flank pain on my exam, states that it has been relatively persistent, but improved with medications and time.  While his blood pressure is elevated here in the ED, he states that he did not take his antihypertensives today.  He is also in considerable discomfort.  Patient states that his left-sided flank discomfort radiates down into the suprapubic and LLQ region.  Denies any current dysuria, increased frequency or hesitancy, fevers or chills, chest pain or shortness of breath, or other symptoms.  HPI     Past Medical History:  Diagnosis Date  . Arthritis   . Depression   . Gout   . Hypercholesterolemia   . Hypertension    on meds  . Kidney damage   . Kidney stone   . Obesity   . Sleep apnea    uses CPAP    Patient Active Problem List   Diagnosis Date Noted  . 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 Weakness of left leg 11/04/2013  . Paresthesias 11/04/2013  . Lumbar radiculopathy 11/04/2013  . Degenerative disc disease, lumbar 11/04/2013  . ONYCHOMYCOSIS 12/23/2006  . HYPERLIPIDEMIA 12/23/2006  . ANXIETY DISORDER, GENERALIZED  12/23/2006  . TOBACCO ABUSE 12/23/2006  . SYNDROME, CARPAL TUNNEL 12/23/2006  . HYPERTENSION 12/23/2006  . NEPHROLITHIASIS, HX OF 12/23/2006    Past Surgical History:  Procedure Laterality Date  . FOOT SURGERY Right 1991  . HAND SURGERY Right 1985  . POSTERIOR LUMBAR FUSION 4 LEVEL N/A 11/29/2013   Procedure: Thoracic Eight, Thoracic Nine, Costotransversectomy   Thoracic Seven-Eight Thoracic Eight-Nine diskectomy;  Surgeon: Lisbeth Renshaw, MD;  Location: [facility];  Service: Neurosurgery;  Laterality: N/A;  Thoracic Eight, Thoracic Nine, Costotransversectomy   Thoracic Seven-Eight Thoracic Eight-Nine diskectomy       Family History  Problem Relation Age of Onset  . Cancer Father        BRAIN/PROSTATE/SKIN  . Hypertension Father   . Cancer Paternal Grandfather   . Heart failure Maternal Grandmother   . Diabetes Maternal Grandmother   . Hypertension Maternal Grandmother   . Hypertension Maternal Grandfather     Social History   Tobacco Use  . Smoking status: Current Every Day Smoker    Packs/day: 1.00    Years: 30.00    Pack years: 30.00    Types: Cigarettes  . Smokeless tobacco: Former Clinical biochemist  . Vaping Use: Never used  Substance Use Topics  . Alcohol use: Yes    Comment: couple times a year  . Drug use: No    Home Medications Prior to Admission medications   Medication Sig Start Date End  Date Taking? Authorizing Provider  methylPREDNISolone (MEDROL DOSEPAK) 4 MG TBPK tablet Use as directed. 04/16/20  Yes Lorelee NewGreen, Bren Borys L, PA-C  acetaminophen (TYLENOL) 500 MG tablet Take 1,000 mg by mouth every 6 (six) hours as needed.    [provider]  albuterol (VENTOLIN HFA) 108 (90 Base) MCG/ACT inhaler Inhale 1-2 puffs into the lungs every 6 (six) hours as needed for wheezing or shortness of breath. 02/22/20   Long, Arlyss RepressJoshua G, MD  allopurinol (ZYLOPRIM) 100 MG tablet Take 100 mg by mouth daily. 05/04/19   [provider]  benzonatate (TESSALON)  100 MG capsule Take 1 capsule (100 mg total) by mouth 3 (three) times daily as needed for cough. 02/22/20   Long, Arlyss RepressJoshua G, MD  HYDROcodone-acetaminophen (NORCO/VICODIN) 5-325 MG tablet Take 1 tablet by mouth every 6 (six) hours as needed for severe pain. 04/10/20   Gerhard MunchLockwood, Robert, MD  hydrocortisone (ANUSOL-HC) 25 MG suppository Place 1 suppository (25 mg total) rectally 2 (two) times daily. Patient not taking: No sig reported 08/09/19   Bethel BornGekas, Kelly Marie, PA-C  ibuprofen (ADVIL) 600 MG tablet Take 1 tablet (600 mg total) by mouth every 6 (six) hours as needed. Patient not taking: No sig reported 06/17/19   Jacalyn LefevreHaviland, Julie, MD  morphine (MSIR) 15 MG tablet Take 0.5 tablets (7.5 mg total) by mouth every 12 (twelve) hours as needed for severe pain. Patient not taking: No sig reported 08/16/19   Couture, Cortni S, PA-C  ondansetron (ZOFRAN ODT) 4 MG disintegrating tablet Take 1 tablet (4 mg total) by mouth every 8 (eight) hours as needed for nausea or vomiting. 08/09/19   Bethel BornGekas, Kelly Marie, PA-C  ondansetron (ZOFRAN ODT) 4 MG disintegrating tablet Take 1 tablet (4 mg total) by mouth every 8 (eight) hours as needed for nausea or vomiting. 04/10/20   Gerhard MunchLockwood, Robert, MD  PARoxetine (PAXIL) 40 MG tablet Take 40 mg by mouth daily. 03/15/18   [provider]  telmisartan (MICARDIS) 20 MG tablet Take 1 tablet (20 mg total) by mouth daily. 04/26/17   Dartha LodgeFord, Kelsey N, PA-C    Allergies    Other  Review of Systems   Review of Systems  All other systems reviewed and are negative.   Physical Exam Updated Vital Signs BP (!) 192/111 (BP Location: Right Arm)   Pulse 82   Temp 98.5 F (36.9 C) (Oral)   Resp (!) 22   Ht 5\' 7"  (1.702 m)   Wt 99.8 kg   SpO2 100%   BMI 34.46 kg/m   Physical Exam Vitals and nursing note reviewed. Exam conducted with a chaperone present.  Constitutional:      Appearance: Normal appearance.  HENT:     Head: Normocephalic and atraumatic.  Eyes:     General: No  scleral icterus.    Conjunctiva/sclera: Conjunctivae normal.  Cardiovascular:     Rate and Rhythm: Normal rate.  Pulmonary:     Effort: Pulmonary effort is normal. No respiratory distress.  Abdominal:     General: Abdomen is flat. There is no distension.     Palpations: Abdomen is soft.     Tenderness: There is abdominal tenderness. There is left CVA tenderness. There is no right CVA tenderness or guarding.  Musculoskeletal:        General: Normal range of motion.  Skin:    General: Skin is dry.  Neurological:     Mental Status: He is alert and oriented to person, place, and time.     GCS:  GCS eye subscore is 4. GCS verbal subscore is 5. GCS motor subscore is 6.  Psychiatric:        Mood and Affect: Mood normal.        Behavior: Behavior normal.        Thought Content: Thought content normal.     ED Results / Procedures / Treatments   Labs (all labs ordered are listed, but only abnormal results are displayed) Labs Reviewed  COMPREHENSIVE METABOLIC PANEL - Abnormal; Notable for the following components:      Result Value   Creatinine, Ser 1.39 (*)    GFR, Estimated 59 (*)    All other components within normal limits  CBC WITH DIFFERENTIAL/PLATELET  LIPASE, BLOOD  URINALYSIS, ROUTINE W REFLEX MICROSCOPIC    EKG None  Radiology CT Renal Stone Study  Result Date: 04/16/2020 CLINICAL DATA:  Left flank pain. History of renal stones. Recent COVID infection. EXAM: CT ABDOMEN AND PELVIS WITHOUT CONTRAST TECHNIQUE: Multidetector CT imaging of the abdomen and pelvis was performed following the standard protocol without IV contrast. COMPARISON:  CT abdomen pelvis 04/10/2020 FINDINGS: Lower chest: Mild peripheral infiltrates in the lung basis left greater than right with improvement. No effusion. Hepatobiliary: Cirrhosis of liver without focal liver lesion. Gallbladder and bile ducts normal. Pancreas: Negative Spleen: Negative Adrenals/Urinary Tract: Multiple right renal calculi. No  renal obstruction. Previous calculi in the distal right ureter have passed. Multiple left renal calculi. No hydronephrosis. 8 mm calculus left renal pelvis unchanged without hydronephrosis. Bladder wall thickening.  No bladder calculi. Stomach/Bowel: Stomach is within normal limits. Appendix appears normal. No evidence of bowel wall thickening, distention, or inflammatory changes. Vascular/Lymphatic: Atherosclerotic aorta without aneurysm. Reproductive: Mild prostate enlargement. Other: Small bilateral inguinal hernia. Musculoskeletal: Degenerative changes in the lumbar spine without acute skeletal abnormality. IMPRESSION: Bilateral renal calculi. Right ureteral calculi have passed since the prior CT of 04/10/2020. Calculus in the left renal pelvis unchanged from prior study. No left hydronephrosis Improving peripheral infiltrates in the lung bases compatible with previous COVID infection. Cirrhosis Electronically Signed   By: Marlan Palau M.D.   On: 04/16/2020 17:26    Procedures Procedures   Medications Ordered in ED Medications  ketorolac (TORADOL) 30 MG/ML injection 30 mg (30 mg Intravenous Given 04/16/20 1657)  ondansetron (ZOFRAN) injection 4 mg (4 mg Intravenous Given 04/16/20 1657)    ED Course  I have reviewed the triage vital signs and the nursing notes.  Pertinent labs & imaging results that were available during my care of the patient were reviewed by me and considered in my medical decision making (see chart for details).    MDM Rules/Calculators/A&P                          DEVIN GANAWAY was evaluated in Emergency Department on 04/16/2020 for the symptoms described in the history of present illness. He was evaluated in the context of the global COVID-19 pandemic, which necessitated consideration that the patient might be at risk for infection with the SARS-CoV-2 virus that causes COVID-19. Institutional protocols and algorithms that pertain to the evaluation of patients at  risk for COVID-19 are in a state of rapid change based on information released by regulatory bodies including the CDC and federal and state organizations. These policies and algorithms were followed during the patient's care in the ED.  I personally reviewed patient's medical chart and all notes from triage and staff during today's encounter. I have also  ordered and reviewed all labs and imaging that I felt to be medically necessary in the evaluation of this patient's complaints and with consideration of their physical exam. If needed, translation services were available and utilized.   History and physical exam concerning for left-sided obstructing ureterolithiasis.  CT renal stone study obtained 04/10/2020 revealed bilateral nephrolithiasis and at least two distal right ureteral stones with mild right-sided fullness versus hydronephrosis.  We will proceed with repeat renal stone study here in the ED.  UA without hematuria or evidence of infection.  Patient denies any fevers or chills.  CBC without leukocytosis.  Low suspicion for infected kidney stone.  CT is reviewed and demonstrates bilateral renal calculi, but no left-sided hydronephrosis or obstructing ureterolithiasis.  Descending aorta and spleen both visualized and unremarkable.  Also without any abnormalities.    Patient had been seen in January for left lumbar region back pain with sciatica and improved with Medrol Dosepak.  Negative SLR bilaterally, but his discomfort is worse with ambulation.  Suspect he may be muscular.  I discussed this with this patient and feel as though this is reasonable.  He already has analgesics and antiemetics at home.  Encouraging continued NSAIDs, as well.  Strongly emphasized the importance of outpatient follow-up with a urologist.  He will follow-up with his primary care provider in addition to [facility] regarding today's encounter.  Patient felt improved here in the ED with Toradol.    ED return  precautions discussed.  Patient voices understanding and is agreeable to plan.  Patient with elevated BP today. Likely combination of not taking his antihypertensive in addition to his pain symptoms. I discussed with patient their elevated blood pressure and need for close outpatient management of their hypertension. Patient counseled on long-term effects of elevated BP including kidney damage, vascular damage, retinopathy, and risk of stroke and other dangerous outcomes. Patient understanding of close PCP follow up.   Final Clinical Impression(s) / ED Diagnoses Final diagnoses:  Left flank pain    Rx / DC Orders ED Discharge Orders         Ordered    methylPREDNISolone (MEDROL DOSEPAK) 4 MG TBPK tablet        04/16/20 1753           Lorelee New, PA-C 04/16/20 1755    Milagros Loll, MD 04/17/20 [phone]

## 2020-06-06 ENCOUNTER — Encounter: Payer: Self-pay | Admitting: Physical Therapy

## 2020-06-06 ENCOUNTER — Ambulatory Visit: Payer: Managed Care, Other (non HMO) | Attending: Sports Medicine | Admitting: Physical Therapy

## 2020-06-06 ENCOUNTER — Other Ambulatory Visit: Payer: Self-pay

## 2020-06-06 DIAGNOSIS — M62830 Muscle spasm of back: Secondary | ICD-10-CM | POA: Insufficient documentation

## 2020-06-06 DIAGNOSIS — M545 Low back pain, unspecified: Secondary | ICD-10-CM | POA: Insufficient documentation

## 2020-06-06 NOTE — Patient Instructions (Signed)
Access Code: [email or web address] URL: [email or web address] Date: 06/06/2020 Prepared by: Stacie Glaze  Exercises Hooklying Single Knee to Chest Stretch - 1 x daily - 7 x weekly - 1 sets - 10 reps - 10 hold Supine Double Knee to Chest - 1 x daily - 7 x weekly - 1 sets - 10 reps - 10 hold Supine Lower Trunk Rotation - 1 x daily - 7 x weekly - 1 sets - 10 reps - 10 hold Quadruped Cat with Posterior Pelvic Tilt - 1 x daily - 7 x weekly - 1 sets - 10 reps - 10 hold

## 2020-06-06 NOTE — Therapy (Signed)
[facility] [address] [place], [place], [address] Phone: [phone]   Fax:  [phone]  Physical Therapy Evaluation  Patient Details  Name: James Downs MRN: [id number] Date of Birth: 24-Mar-1965 Referring Provider (PT): K. Quenten Raven Date: 06/06/2020   PT End of Session - 06/06/20 1318    Visit Number 1    Date for PT Re-Evaluation 08/06/20    Authorization Type [employer]    PT Start Time 1250    PT Stop Time 1331    PT Time Calculation (min) 41 min    Activity Tolerance Patient tolerated treatment well    Behavior During Therapy WFL for tasks assessed/performed           Past Medical History:  Diagnosis Date  . Arthritis   . Depression   . Gout   . Hypercholesterolemia   . Hypertension    on meds  . Kidney damage   . Kidney stone   . Obesity   . Sleep apnea    uses CPAP    Past Surgical History:  Procedure Laterality Date  . FOOT SURGERY Right 1991  . HAND SURGERY Right 1985  . POSTERIOR LUMBAR FUSION 4 LEVEL N/A 11/29/2013   Procedure: Thoracic Eight, Thoracic Nine, Costotransversectomy   Thoracic Seven-Eight Thoracic Eight-Nine diskectomy;  Surgeon: Lisbeth Renshaw, MD;  Location: [facility];  Service: Neurosurgery;  Laterality: N/A;  Thoracic Eight, Thoracic Nine, Costotransversectomy   Thoracic Seven-Eight Thoracic Eight-Nine diskectomy    There were no vitals filed for this visit.    Subjective Assessment - 06/06/20 1258    Subjective Patient reports that he has had some back pain since January, he reports that he had covid back in January, reports that his mms in the low back went in to spasm, was having to walk with a cane. He has been on mm relaxers and pain meds, reports that he is overall feeling better.  He returned to work last week.    Limitations Lifting;Standing;Sitting;House hold activities    Patient Stated Goals have less pain, be able to move better    Currently in  Pain? Yes    Pain Score 1     Pain Location Back    Pain Orientation Lower    Pain Descriptors / Indicators Aching    Pain Type Acute pain    Pain Onset More than a month ago    Pain Frequency Constant    Aggravating Factors  all activities made it worse when he was in pain pain up to 10/10    Pain Relieving Factors pain seems to have resolved, feeling better now    Effect of Pain on Daily Activities reports that he has been very linmited, thought I was going to be disabled              [facility] PT Assessment - 06/06/20 0001      Assessment   Medical Diagnosis LBP    Referring Provider (PT) K. Alexander    Onset Date/Surgical Date 03/08/20      Precautions   Precautions None      Balance Screen   Has the patient fallen in the past 6 months Yes    How many times? 2    Has the patient had a decrease in activity level because of a fear of falling?  No    Is the patient reluctant to leave their home because of a fear of falling?  No  Home Environment   Additional Comments does yardwork , housework      Prior Function   Level of Independence Independent    Vocation Full time employment    Vocation Requirements on feet most of the time, lifts 50-70#, climbs ladders    Leisure no exercise      Posture/Postural Control   Posture Comments fwd head, rounded shoulders, stooped walking and standing posture      ROM / Strength   AROM / PROM / Strength AROM;Strength      AROM   Overall AROM Comments Lumbar flexion WNL's, extension decreased 75%, side bending decreased 50% just c/o tightness      Strength   Overall Strength Comments Hips and knees 4/5. "just don't have the strength"      Palpation   Palpation comment he is very tight with significant spasms in the lumbar and thoracic spine, he denies being tender      Ambulation/Gait   Gait Comments stooped posture                      Objective measurements completed on examination: See above findings.                  PT Short Term Goals - 06/06/20 1334      PT SHORT TERM GOAL #1   Title independent with initial HEP    Time 2    Period Weeks    Status New             PT Long Term Goals - 06/06/20 1335      PT LONG TERM GOAL #1   Title understand posture and body mechanics instruction    Time 8    Period Weeks    Status New      PT LONG TERM GOAL #2   Title report no pain    Time 8    Period Weeks    Status New      PT LONG TERM GOAL #3   Title increase lumbar ROM 25%    Time 8    Period Weeks    Status New                  Plan - 06/06/20 1326    Clinical Impression Statement Patient reports that he contracted covid in January, he reports that being in bed and being so sick he started having severe LBP, reports taht he could not move well and had to use an assistive device for locomotion.  He reports that he has been taking mm relaxers and pain medication and is feeling better, having less pain and no issues with ADL's, reports that he just returned to work and is doing okay but not fully doing his normal job.  He has decreased lumbar ROM, he has decreased LE strength, his lumbar and thoracic mms are very tight but he is not reporting pain and is not tender.  He has some loss of LE flexibility.  He reports that he feels that him just getting back to normal activities will help and reports that he has not had much pain in the past few weeks    Stability/Clinical Decision Making Evolving/Moderate complexity    Clinical Decision Making Low    Rehab Potential Good    PT Frequency 1x / week    PT Duration 8 weeks    PT Treatment/Interventions ADLs/Self Care Home Management;Cryotherapy;Electrical Stimulation;Moist Heat;Therapeutic activities;Therapeutic exercise;Neuromuscular re-education;Manual techniques;Patient/family education;Dry needling  PT Next Visit Plan he is to try HEP, could cancel if he feels like he is getting better, biggest conern is how  tight his lumbar mms are and the loss of LE strength    Consulted and Agree with Plan of Care Patient           Patient will benefit from skilled therapeutic intervention in order to improve the following deficits and impairments:  Abnormal gait,Decreased range of motion,Difficulty walking,Increased muscle spasms,Decreased activity tolerance,Pain,Impaired flexibility,Improper body mechanics,Postural dysfunction,Decreased strength  Visit Diagnosis: Acute bilateral low back pain without sciatica - Plan: PT plan of care cert/re-cert  Muscle spasm of back - Plan: PT plan of care cert/re-cert     Problem List Patient Active Problem List   Diagnosis Date Noted  . 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 Weakness of left leg 11/04/2013  . Paresthesias 11/04/2013  . Lumbar radiculopathy 11/04/2013  . Degenerative disc disease, lumbar 11/04/2013  . ONYCHOMYCOSIS 12/23/2006  . HYPERLIPIDEMIA 12/23/2006  . ANXIETY DISORDER, GENERALIZED 12/23/2006  . TOBACCO ABUSE 12/23/2006  . SYNDROME, CARPAL TUNNEL 12/23/2006  . HYPERTENSION 12/23/2006  . NEPHROLITHIASIS, HX OF 12/23/2006    Jearld Lesch., PT 06/06/2020, 1:37 PM  [facility] [address] [place], [place], [address] Phone: [phone]   Fax:  [phone]  Name: James Downs MRN: [id number] Date of Birth: 1965/03/23

## 2020-06-20 ENCOUNTER — Ambulatory Visit: Payer: Managed Care, Other (non HMO) | Admitting: Physical Therapy

## 2020-09-16 ENCOUNTER — Emergency Department (HOSPITAL_BASED_OUTPATIENT_CLINIC_OR_DEPARTMENT_OTHER)
Admission: EM | Admit: 2020-09-16 | Discharge: 2020-09-16 | Disposition: A | Discharge status: Home / Self Care | Payer: Medicaid Other | Attending: Emergency Medicine | Admitting: Emergency Medicine

## 2020-09-16 ENCOUNTER — Other Ambulatory Visit: Payer: Self-pay

## 2020-09-16 ENCOUNTER — Encounter (HOSPITAL_BASED_OUTPATIENT_CLINIC_OR_DEPARTMENT_OTHER): Payer: Self-pay

## 2020-09-16 ENCOUNTER — Emergency Department (HOSPITAL_BASED_OUTPATIENT_CLINIC_OR_DEPARTMENT_OTHER): Payer: Medicaid Other

## 2020-09-16 DIAGNOSIS — H532 Diplopia: Secondary | ICD-10-CM

## 2020-09-16 DIAGNOSIS — F17210 Nicotine dependence, cigarettes, uncomplicated: Secondary | ICD-10-CM | POA: Insufficient documentation

## 2020-09-16 DIAGNOSIS — Z79899 Other long term (current) drug therapy: Secondary | ICD-10-CM | POA: Insufficient documentation

## 2020-09-16 DIAGNOSIS — I10 Essential (primary) hypertension: Secondary | ICD-10-CM

## 2020-09-16 LAB — BASIC METABOLIC PANEL
Anion gap: 8 (ref 5–15)
BUN: 9 mg/dL (ref 6–20)
CO2: 24 mmol/L (ref 22–32)
Calcium: 8.8 mg/dL — ABNORMAL LOW (ref 8.9–10.3)
Chloride: 105 mmol/L (ref 98–111)
Creatinine, Ser: 1.26 mg/dL — ABNORMAL HIGH (ref 0.61–1.24)
GFR, Estimated: >60 mL/min (ref >60)
Glucose, Bld: 109 mg/dL — ABNORMAL HIGH (ref 70–99)
Potassium: 3.6 mmol/L (ref 3.5–5.1)
Sodium: 137 mmol/L (ref 135–145)

## 2020-09-16 LAB — CBC
HCT: 44.6 % (ref 39.0–52.0)
Hemoglobin: 15.3 g/dL (ref 13.0–17.0)
MCH: 30.7 pg (ref 26.0–34.0)
MCHC: 34.3 g/dL (ref 30.0–36.0)
MCV: 89.4 fL (ref 80.0–100.0)
Platelets: 242 10*3/uL (ref 150–400)
RBC: 4.99 MIL/uL (ref 4.22–5.81)
RDW: 13.2 % (ref 11.5–15.5)
WBC: 6 10*3/uL (ref 4.0–10.5)
nRBC: 0 % (ref 0.0–0.2)

## 2020-09-16 LAB — TROPONIN I (HIGH SENSITIVITY): Troponin I (High Sensitivity): 5 ng/L (ref <18)

## 2020-09-16 MED ORDER — TIZANIDINE HCL 4 MG PO TABS: 4.0000 mg | ORAL_TABLET | Freq: Four times a day (QID) | ORAL | 0 refills | Status: DC | PRN

## 2020-09-16 NOTE — ED Notes (Signed)
Takes BP meds at bedtime

## 2020-09-16 NOTE — Discharge Instructions (Addendum)
Please call first thing tomorrow to schedule close follow-up with your regular doctor to discuss your high blood pressure and how best to change your medication.  I would also like for you to follow-up with your eye doctor or the ophthalmologist listed below for follow-up regarding your intermittent double vision.  If you develop headache, numbness, weakness, chest pain, feel like you are going to pass out or have any other new or worsening symptoms return for reevaluation.

## 2020-09-16 NOTE — ED Provider Notes (Signed)
[facility] EMERGENCY DEPARTMENT Provider Note   CSN: [id number] Arrival date & time: 09/16/20  1710     History Chief Complaint  Patient presents with   Hypertension    James Downs is a 55 y.o. male.  James Downs is a 55 y.o. male with a history of hypertension, kidney stones, obesity, sleep apnea, gout, depression, who presents to the emergency department for evaluation of high blood pressure.  Patient reports over the past 3 weeks he has noted that his blood pressure has been much higher than usual ranging at 1 70-200 systolic despite taking his blood pressure medication regularly.  Patient reports he takes telmisartan 20 mg daily.  Patient reports over the past 3 weeks he has also been having intermittent double vision.  He reports he notices this in particular when trying to pick something up close.  He denies associated headaches, no numbness or weakness.  No speech changes.  He denies any associated chest pain, shortness of breath, abdominal pain or leg swelling.  He has not seen his PCP regarding these elevated blood pressure readings.  Patient also reports he has been dealing with low back spasms for several months, was previously taking tizanidine which seemed to help but ran out of this a few weeks ago.  The history is provided by the patient.      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   Weakness of left leg 11/04/2013   Paresthesias 11/04/2013   Lumbar radiculopathy 11/04/2013   Degenerative disc disease, lumbar 11/04/2013   ONYCHOMYCOSIS 12/23/2006   HYPERLIPIDEMIA 12/23/2006   ANXIETY DISORDER, GENERALIZED 12/23/2006   TOBACCO ABUSE 12/23/2006    SYNDROME, CARPAL TUNNEL 12/23/2006   HYPERTENSION 12/23/2006   NEPHROLITHIASIS, HX OF 12/23/2006    Past Surgical History:  Procedure Laterality Date   FOOT SURGERY Right 1991   HAND SURGERY Right 1985   POSTERIOR LUMBAR FUSION 4 LEVEL N/A 11/29/2013   Procedure: Thoracic Eight, Thoracic Nine, Costotransversectomy   Thoracic Seven-Eight Thoracic Eight-Nine diskectomy;  Surgeon: James RenshawNeelesh Nundkumar, Downs;  Location: [facility];  Service: Neurosurgery;  Laterality: N/A;  Thoracic Eight, Thoracic Nine, Costotransversectomy   Thoracic Seven-Eight Thoracic Eight-Nine diskectomy       Family History  Problem Relation Age of Onset   Cancer Father        BRAIN/PROSTATE/SKIN   Hypertension Father    Cancer Paternal Grandfather    Heart failure Maternal Grandmother    Diabetes Maternal Grandmother    Hypertension Maternal Grandmother    Hypertension Maternal Grandfather     Social History   Tobacco Use   Smoking status: Every Day    Packs/day: 1.00    Years: 30.00    Pack years: 30.00    Types: Cigarettes   Smokeless tobacco: Former  Building services engineerVaping Use   Vaping Use: Never used  Substance Use Topics   Alcohol use: Yes    Comment: couple times a year   Drug use: No    Home Medications Prior to Admission medications   Medication Sig Start Date End Date Taking? Authorizing Provider  tiZANidine (  ZANAFLEX) 4 MG tablet Take 1 tablet (4 mg total) by mouth every 6 (six) hours as needed for muscle spasms. 09/16/20  Yes James Downs  acetaminophen (TYLENOL) 500 MG tablet Take 1,000 mg by mouth every 6 (six) hours as needed.    James Downs  albuterol (VENTOLIN HFA) 108 (90 Base) MCG/ACT inhaler Inhale 1-2 puffs into the lungs every 6 (six) hours as needed for wheezing or shortness of breath. 02/22/20   James Downs  allopurinol (ZYLOPRIM) 100 MG tablet Take 100 mg by mouth daily. 05/04/19   James Downs  benzonatate (TESSALON) 100 MG capsule Take 1 capsule (100  mg total) by mouth 3 (three) times daily as needed for cough. 02/22/20   James Downs  HYDROcodone-acetaminophen (NORCO/VICODIN) 5-325 MG tablet Take 1 tablet by mouth every 6 (six) hours as needed for severe pain. 04/10/20   James Downs  hydrocortisone (ANUSOL-HC) 25 MG suppository Place 1 suppository (25 mg total) rectally 2 (two) times daily. Patient not taking: No sig reported 08/09/19   James Downs  ibuprofen (ADVIL) 600 MG tablet Take 1 tablet (600 mg total) by mouth every 6 (six) hours as needed. Patient not taking: No sig reported 06/17/19   James Downs  methylPREDNISolone (MEDROL DOSEPAK) 4 MG TBPK tablet Use as directed. 04/16/20   James Downs  morphine (MSIR) 15 MG tablet Take 0.5 tablets (7.5 mg total) by mouth every 12 (twelve) hours as needed for severe pain. Patient not taking: No sig reported 08/16/19   Downs, James Downs  ondansetron (ZOFRAN ODT) 4 MG disintegrating tablet Take 1 tablet (4 mg total) by mouth every 8 (eight) hours as needed for nausea or vomiting. 08/09/19   James Downs  ondansetron (ZOFRAN ODT) 4 MG disintegrating tablet Take 1 tablet (4 mg total) by mouth every 8 (eight) hours as needed for nausea or vomiting. 04/10/20   James Downs  PARoxetine (PAXIL) 40 MG tablet Take 40 mg by mouth daily. 03/15/18   James Downs  telmisartan (MICARDIS) 20 MG tablet Take 1 tablet (20 mg total) by mouth daily. 04/26/17   James Downs    Allergies    Other  Review of Systems   Review of Systems  Constitutional:  Negative for chills and fever.  HENT: Negative.    Eyes:  Positive for visual disturbance.  Respiratory:  Negative for shortness of breath.   Cardiovascular:  Negative for chest pain.  Gastrointestinal:  Negative for abdominal pain.  Genitourinary:  Negative for dysuria.  Musculoskeletal:  Positive for back pain. Negative for arthralgias and myalgias.  Neurological:  Negative for  dizziness, syncope, weakness, light-headedness, numbness and headaches.  All other systems reviewed and are negative.  Physical Exam Updated Vital Signs BP (!) 160/100 (BP Location: Left Arm)   Pulse 68   Temp 98.2 F (36.8 C) (Oral)   Resp 16   Ht 5\' 7"  (1.702 m)   Wt 93.4 kg   SpO2 100%   BMI 32.26 kg/m   Physical Exam Vitals and nursing note reviewed.  Constitutional:      General: He is not in acute distress.    Appearance: Normal appearance. He is well-developed. He is not ill-appearing or diaphoretic.  HENT:     Head: Normocephalic and atraumatic.  Eyes:     General:        Right eye: No discharge.  Left eye: No discharge.     Extraocular Movements: Extraocular movements intact.     Conjunctiva/sclera: Conjunctivae normal.     Pupils: Pupils are equal, round, and reactive to light.     Comments: Visual fields intact, no diplopia currently  Cardiovascular:     Rate and Rhythm: Normal rate and regular rhythm.     Pulses: Normal pulses.     Heart sounds: Normal heart sounds. No murmur heard.   No friction rub. No gallop.  Pulmonary:     Effort: Pulmonary effort is normal. No respiratory distress.     Breath sounds: Normal breath sounds. No wheezing or rales.     Comments: Respirations equal and unlabored, patient able to speak in full sentences, lungs clear to auscultation bilaterally  Abdominal:     General: Bowel sounds are normal. There is no distension.     Palpations: Abdomen is soft. There is no mass.     Tenderness: There is no abdominal tenderness. There is no guarding.     Comments: Abdomen soft, nondistended, nontender to palpation in all quadrants without guarding or peritoneal signs  Musculoskeletal:        General: No deformity.     Cervical back: Neck supple.     Right lower leg: No edema.     Left lower leg: No edema.  Skin:    General: Skin is warm and dry.     Capillary Refill: Capillary refill takes less than 2 seconds.  Neurological:      Mental Status: He is alert and oriented to person, place, and time.     Coordination: Coordination normal.     Comments: Speech is clear, able to follow commands CN III-XII intact Normal strength in upper and lower extremities bilaterally including dorsiflexion and plantar flexion, strong and equal grip strength Sensation normal to light and sharp touch Moves extremities without ataxia, coordination intact  Psychiatric:        Mood and Affect: Mood normal.        Behavior: Behavior normal.    ED Results / Procedures / Treatments   Labs (all labs ordered are listed, but only abnormal results are displayed) Labs Reviewed  BASIC METABOLIC PANEL - Abnormal; Notable for the following components:      Result Value   Glucose, Bld 109 (*)    Creatinine, Ser 1.26 (*)    Calcium 8.8 (*)    All other components within normal limits  CBC  TROPONIN I (HIGH SENSITIVITY)  TROPONIN I (HIGH SENSITIVITY)    EKG EKG Interpretation  Date/Time:  Sunday September 16 2020 17:36:08 EDT Ventricular Rate:  92 PR Interval:  134 QRS Duration: 76 QT Interval:  352 QTC Calculation: 435 R Axis:   57 Text Interpretation: Normal sinus rhythm Normal ECG Confirmed by Tilden Fossa [phone]) on 09/16/2020 8:45:52 PM  Radiology CT Head Wo Contrast  Result Date: 09/16/2020 CLINICAL DATA:  Diplopia.  Hypertension for 3 weeks. EXAM: CT HEAD WITHOUT CONTRAST TECHNIQUE: Contiguous axial images were obtained from the base of the skull through the vertex without intravenous contrast. COMPARISON:  CT head 03/13/2015.  MRI brain 11/19/2013 FINDINGS: Brain: No evidence of acute infarction, hemorrhage, hydrocephalus, extra-axial collection or mass lesion/mass effect. Vascular: No hyperdense vessel or unexpected calcification. Skull: Normal. Negative for fracture or focal lesion. Sinuses/Orbits: Paranasal sinuses and mastoid air cells are clear. Orbital contents appear intact and symmetrical. No sellar or suprasellar mass  lesion identified. Other: None. IMPRESSION: No acute intracranial abnormalities. Electronically Signed  By: Burman Nieves M.D.   On: 09/16/2020 21:01    Procedures Procedures   Medications Ordered in ED Medications - No data to display  ED Course  I have reviewed the triage vital signs and the nursing notes.  Pertinent labs & imaging results that were available during my care of the patient were reviewed by me and considered in my medical decision making (see chart for details).    MDM Rules/Calculators/A&P                          55 year old male presents with 3 weeks and elevated blood pressure associated with intermittent double vision.  No other associated neurologic symptoms, no headaches, no chest pain, shortness of breath or lower extremity swelling.  On arrival patient's blood pressure is elevated at 158/101, vitals otherwise normal.  He typically takes telmisartan 20 mg daily and has been compliant with this medicine.  Has not seen his doctor regarding elevated blood pressures.  Also requesting refill of muscle relaxer for back spasms that he has been dealing with for several months.  Labs ordered from triage which are overall reassuring, no leukocytosis and normal hemoglobin, no significant electrolyte derangements, patient with some baseline CKD but no worsening in kidney function, troponin negative.  Given diplopia we will also get CT to rule out intracranial mass.  CT reviewed by myself, agree with radiologist findings, no acute intracranial abnormalities  Blood pressure is elevated mildly today, systolic in the 150s-160s.  Given reassuring work-up today we will have him follow-up closely with his PCP to adjust his blood pressure management.  Do not feel that blood pressure needs acute treatment currently, no signs of hypertensive urgency or emergency.  Also recommend that he follow-up with his eye doctor for recheck given diplopia that is intermittent and worse when  reading up close.  Patient expresses understanding and agreement with this plan.  Discharged home in good condition.  Final Clinical Impression(s) / ED Diagnoses Final diagnoses:  Hypertension, unspecified type  Double vision    Rx / DC Orders ED Discharge Orders          Ordered    tiZANidine (ZANAFLEX) 4 MG tablet  Every 6 hours PRN        09/16/20 2202             James Downs 09/19/20 0231    Tilden Fossa, Downs 09/20/20 [phone]

## 2020-09-16 NOTE — ED Triage Notes (Addendum)
Pt c/o hypertension the past 3 weeks. States between 170-200 systolic. Double vision since BP has been elevated. C/o lumbar back spasms since January. Intermittent "heart burn" for a few weeks

## 2020-10-20 ENCOUNTER — Encounter (HOSPITAL_BASED_OUTPATIENT_CLINIC_OR_DEPARTMENT_OTHER): Payer: Self-pay | Admitting: Emergency Medicine

## 2020-10-20 ENCOUNTER — Emergency Department (HOSPITAL_BASED_OUTPATIENT_CLINIC_OR_DEPARTMENT_OTHER): Payer: Medicaid Other

## 2020-10-20 ENCOUNTER — Other Ambulatory Visit: Payer: Self-pay

## 2020-10-20 ENCOUNTER — Emergency Department (HOSPITAL_BASED_OUTPATIENT_CLINIC_OR_DEPARTMENT_OTHER)
Admission: EM | Admit: 2020-10-20 | Discharge: 2020-10-20 | Disposition: A | Discharge status: Home / Self Care | Payer: Medicaid Other | Attending: Emergency Medicine | Admitting: Emergency Medicine

## 2020-10-20 DIAGNOSIS — I10 Essential (primary) hypertension: Secondary | ICD-10-CM | POA: Insufficient documentation

## 2020-10-20 DIAGNOSIS — W010XXA Fall on same level from slipping, tripping and stumbling without subsequent striking against object, initial encounter: Secondary | ICD-10-CM | POA: Insufficient documentation

## 2020-10-20 DIAGNOSIS — F17210 Nicotine dependence, cigarettes, uncomplicated: Secondary | ICD-10-CM | POA: Insufficient documentation

## 2020-10-20 DIAGNOSIS — M25561 Pain in right knee: Secondary | ICD-10-CM

## 2020-10-20 DIAGNOSIS — Z79899 Other long term (current) drug therapy: Secondary | ICD-10-CM | POA: Insufficient documentation

## 2020-10-20 MED ORDER — HYDROCODONE-ACETAMINOPHEN 5-325 MG PO TABS: 1.0000 | ORAL_TABLET | Freq: Four times a day (QID) | ORAL | 0 refills | Status: AC | PRN

## 2020-10-20 MED ORDER — HYDROCODONE-ACETAMINOPHEN 5-325 MG PO TABS
1.0000 | ORAL_TABLET | Freq: Once | ORAL | Status: AC
Start: 2020-10-20 — End: 2020-10-20
  Administered 2020-10-20: 1 via ORAL
  Filled 2020-10-20: qty 1

## 2020-10-20 MED ORDER — IBUPROFEN 800 MG PO TABS
800.0000 mg | ORAL_TABLET | Freq: Once | ORAL | Status: AC
  Administered 2020-10-20: 800 mg via ORAL
  Filled 2020-10-20: qty 1

## 2020-10-20 NOTE — ED Triage Notes (Signed)
Pt reports trip and fall 2 days ago landing on right knee.

## 2020-10-20 NOTE — Discharge Instructions (Addendum)
Your x-ray shows no fracture or fluid in the knee joint.  I would like you to stay off of your knee and utilize the knee immobilizer and crutches.  There are instructions attached.  I am sending you 3 days worth of Vicodin to the pharmacy.  Take this as needed however do not drive or operate machinery because this is an opioid.  You may take ibuprofen with this medication however you should not take acetaminophen.  I have placed a referral at the top of your papers to get in touch with orthopedic doctors in case you feel as though you are not healing well.

## 2020-10-20 NOTE — ED Provider Notes (Signed)
[facility] EMERGENCY DEPARTMENT Provider Note   CSN: [id number] Arrival date & time: 10/20/20  1131     History Chief Complaint  Patient presents with   Knee Pain    James Downs is a 55 y.o. male with a past medical history of arthritis who presents after a fall on Thursday.  Patient reports that he tripped and fell landing on his right knee.  Denies hitting his head.  Says that he caught himself with his hands however no wrist or finger pain.  Pain and swelling has increased from Thursday until now.  Has tried to ice and use ibuprofen however this has not resolved his symptoms.  Reports that he is unable to bear weight on the right foot.  No numbness, tingling, warmth or bleeding to the site.  No other tenderness or injury.   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   Weakness of left leg 11/04/2013   Paresthesias 11/04/2013   Lumbar radiculopathy 11/04/2013   Degenerative disc disease, lumbar 11/04/2013   ONYCHOMYCOSIS 12/23/2006   HYPERLIPIDEMIA 12/23/2006   ANXIETY DISORDER, GENERALIZED 12/23/2006   TOBACCO ABUSE 12/23/2006   SYNDROME, CARPAL TUNNEL 12/23/2006   HYPERTENSION 12/23/2006   NEPHROLITHIASIS, HX OF 12/23/2006    Past Surgical History:  Procedure Laterality Date   FOOT SURGERY Right 1991   HAND SURGERY Right 1985   POSTERIOR LUMBAR FUSION 4 LEVEL N/A 11/29/2013   Procedure: Thoracic Eight, Thoracic Nine, Costotransversectomy   Thoracic Seven-Eight Thoracic Eight-Nine diskectomy;  Surgeon: Lisbeth Renshaw, MD;  Location: [facility];  Service: Neurosurgery;  Laterality: N/A;   Thoracic Eight, Thoracic Nine, Costotransversectomy   Thoracic Seven-Eight Thoracic Eight-Nine diskectomy       Family History  Problem Relation Age of Onset   Cancer Father        BRAIN/PROSTATE/SKIN   Hypertension Father    Cancer Paternal Grandfather    Heart failure Maternal Grandmother    Diabetes Maternal Grandmother    Hypertension Maternal Grandmother    Hypertension Maternal Grandfather     Social History   Tobacco Use   Smoking status: Every Day    Packs/day: 1.00    Years: 30.00    Pack years: 30.00    Types: Cigarettes   Smokeless tobacco: Former  Building services engineer Use: Never used  Substance Use Topics   Alcohol use: Yes    Comment: couple times a year   Drug use: No    Home Medications Prior to Admission medications   Medication Sig Start Date End Date Taking? Authorizing Provider  acetaminophen (TYLENOL) 500 MG tablet Take 1,000 mg by mouth every 6 (six) hours as needed.    [provider]  albuterol (VENTOLIN HFA) 108 (90 Base) MCG/ACT inhaler Inhale 1-2 puffs into the lungs every 6 (six) hours as needed for wheezing or shortness of breath. 02/22/20   Long, Arlyss Repress, MD  allopurinol (ZYLOPRIM) 100 MG tablet Take 100 mg by mouth daily. 05/04/19  [provider]  benzonatate (TESSALON) 100 MG capsule Take 1 capsule (100 mg total) by mouth 3 (three) times daily as needed for cough. 02/22/20   Long, Arlyss Repress, MD  HYDROcodone-acetaminophen (NORCO/VICODIN) 5-325 MG tablet Take 1 tablet by mouth every 6 (six) hours as needed for severe pain. 04/10/20   Gerhard Munch, MD  hydrocortisone (ANUSOL-HC) 25 MG suppository Place 1 suppository (25 mg total) rectally 2 (two) times daily. Patient not taking: No sig reported 08/09/19   Bethel Born, PA-C  ibuprofen (ADVIL) 600 MG tablet Take 1 tablet (600 mg total) by mouth every 6 (six) hours as needed. Patient not taking: No sig reported 06/17/19   Jacalyn Lefevre, MD  methylPREDNISolone (MEDROL  DOSEPAK) 4 MG TBPK tablet Use as directed. 04/16/20   Lorelee New, PA-C  morphine (MSIR) 15 MG tablet Take 0.5 tablets (7.5 mg total) by mouth every 12 (twelve) hours as needed for severe pain. Patient not taking: No sig reported 08/16/19   Couture, Cortni S, PA-C  ondansetron (ZOFRAN ODT) 4 MG disintegrating tablet Take 1 tablet (4 mg total) by mouth every 8 (eight) hours as needed for nausea or vomiting. 08/09/19   Bethel Born, PA-C  ondansetron (ZOFRAN ODT) 4 MG disintegrating tablet Take 1 tablet (4 mg total) by mouth every 8 (eight) hours as needed for nausea or vomiting. 04/10/20   Gerhard Munch, MD  PARoxetine (PAXIL) 40 MG tablet Take 40 mg by mouth daily. 03/15/18   [provider]  telmisartan (MICARDIS) 20 MG tablet Take 1 tablet (20 mg total) by mouth daily. 04/26/17   Dartha Lodge, PA-C  tiZANidine (ZANAFLEX) 4 MG tablet Take 1 tablet (4 mg total) by mouth every 6 (six) hours as needed for muscle spasms. 09/16/20   Dartha Lodge, PA-C    Allergies    Other  Review of Systems   Review of Systems  Constitutional:  Negative for fatigue and fever.  Musculoskeletal:  Positive for gait problem ("Big limp."). Negative for back pain.  Neurological:  Negative for dizziness, weakness, light-headedness, numbness and headaches.  Psychiatric/Behavioral:  Negative for confusion.   All other systems reviewed and are negative.  Physical Exam Updated Vital Signs BP (!) 141/102 (BP Location: Right Arm)   Pulse 98   Temp 99.4 F (37.4 C) (Oral)   Resp 20   Ht 5\' 6"  (1.676 m)   Wt 99.8 kg   SpO2 99%   BMI 35.51 kg/m   Physical Exam Vitals and nursing note reviewed.  Constitutional:      Appearance: Normal appearance.  HENT:     Head: Normocephalic and atraumatic.  Eyes:     General: No scleral icterus.    Conjunctiva/sclera: Conjunctivae normal.  Cardiovascular:     Rate and Rhythm: Normal rate and regular rhythm.  Pulmonary:     Effort: Pulmonary effort is  normal. No respiratory distress.     Breath sounds: Normal breath sounds.  Musculoskeletal:        General: Swelling, tenderness and signs of injury present. Normal range of motion.     Cervical back: Normal range of motion and neck supple. No tenderness.     Comments: Large amount of anterior swelling to the right knee.  Tenderness localized to the lateral aspect of the joint.  No bony tenderness.  Limited range of motion, able to flex and extend the joint.   Skin:    General: Skin is warm and dry.     Findings:  No rash.     Comments: Small abrasion notated to the anterior left thigh  Neurological:     Mental Status: He is alert.     Sensory: No sensory deficit.     Motor: No weakness.     Coordination: Coordination normal.     Gait: Gait abnormal.  Psychiatric:        Mood and Affect: Mood normal.    ED Results / Procedures / Treatments   Labs (all labs ordered are listed, but only abnormal results are displayed) Labs Reviewed - No data to display  EKG None  Radiology DG Knee Complete 4 Views Right  Result Date: 10/20/2020 CLINICAL DATA:  Recent injury, knee pain. EXAM: RIGHT KNEE - COMPLETE 4+ VIEW COMPARISON:  None. FINDINGS: Osseous alignment is normal. No fracture line or displaced fracture fragment is seen. No convincing joint effusion. Soft tissue swelling over the patella. No underlying fracture identified. IMPRESSION: Soft tissue swelling. No osseous fracture or dislocation. No convincing joint effusion. Electronically Signed   By: Bary Ruffus M.D.   On: 10/20/2020 13:19    Procedures Procedures   Medications Ordered in ED Medications  ibuprofen (ADVIL) tablet 800 mg (800 mg Oral Given 10/20/20 1306)    ED Course  I have reviewed the triage vital signs and the nursing notes.  Pertinent labs & imaging results that were available during my care of the patient were reviewed by me and considered in my medical decision making (see chart for details).    MDM  Rules/Calculators/A&P Patient was a 55 year old male who presented today with a complaint of right knee pain since Thursday.  He reports falling and and hitting his knee on the ground but denies other injuries.  He attempted ice and ibuprofen at home however this has not helped and his knee continues to swell.  Patient was given ibuprofen and Vicodin for his severe pain.  I obtained a radiograph of the right knee which revealed no acute fracture but extensive soft tissue swelling.  Patient relieved that there is no fracture, but we discussed the plan to treat with knee immobilizer and crutches.  I also gave the patient a referral to orthopedics in the event that his knee is not healing.  He will continue RICE therapy.  Requesting discharge with his wife.  Stable for discharge.  Return precautions discussed. Final Clinical Impression(s) / ED Diagnoses Final diagnoses:  Acute pain of right knee    Rx / DC Orders Results and diagnoses were explained to the patient. Return precautions discussed in full. Patient had no additional questions and expressed complete understanding.     Saddie Benders, PA-C 10/20/20 1453    Gloris Manchester, MD 10/21/20 [phone]

## 2021-03-25 ENCOUNTER — Encounter (HOSPITAL_BASED_OUTPATIENT_CLINIC_OR_DEPARTMENT_OTHER): Payer: Self-pay | Admitting: *Deleted

## 2021-03-25 ENCOUNTER — Emergency Department (HOSPITAL_BASED_OUTPATIENT_CLINIC_OR_DEPARTMENT_OTHER)
Admission: EM | Admit: 2021-03-25 | Discharge: 2021-03-25 | Disposition: A | Discharge status: Home / Self Care | Payer: Medicaid Other | Attending: Emergency Medicine | Admitting: Emergency Medicine

## 2021-03-25 ENCOUNTER — Other Ambulatory Visit: Payer: Self-pay

## 2021-03-25 DIAGNOSIS — H66002 Acute suppurative otitis media without spontaneous rupture of ear drum, left ear: Secondary | ICD-10-CM

## 2021-03-25 DIAGNOSIS — B349 Viral infection, unspecified: Secondary | ICD-10-CM

## 2021-03-25 DIAGNOSIS — R0981 Nasal congestion: Secondary | ICD-10-CM | POA: Insufficient documentation

## 2021-03-25 DIAGNOSIS — R062 Wheezing: Secondary | ICD-10-CM | POA: Insufficient documentation

## 2021-03-25 DIAGNOSIS — H6692 Otitis media, unspecified, left ear: Secondary | ICD-10-CM | POA: Insufficient documentation

## 2021-03-25 DIAGNOSIS — Z20822 Contact with and (suspected) exposure to covid-19: Secondary | ICD-10-CM | POA: Insufficient documentation

## 2021-03-25 DIAGNOSIS — R059 Cough, unspecified: Secondary | ICD-10-CM | POA: Insufficient documentation

## 2021-03-25 DIAGNOSIS — I10 Essential (primary) hypertension: Secondary | ICD-10-CM

## 2021-03-25 DIAGNOSIS — R52 Pain, unspecified: Secondary | ICD-10-CM | POA: Insufficient documentation

## 2021-03-25 LAB — RESP PANEL BY RT-PCR (FLU A&B, COVID) ARPGX2
Influenza A by PCR: NEGATIVE
Influenza B by PCR: NEGATIVE
SARS Coronavirus 2 by RT PCR: NEGATIVE

## 2021-03-25 MED ORDER — AMOXICILLIN-POT CLAVULANATE 875-125 MG PO TABS: 1.0000 | ORAL_TABLET | Freq: Two times a day (BID) | ORAL | 0 refills | Status: AC

## 2021-03-25 MED ORDER — TELMISARTAN 20 MG PO TABS: 20.0000 mg | ORAL_TABLET | Freq: Every day | ORAL | 0 refills | Status: DC

## 2021-03-25 MED ORDER — METHYLPREDNISOLONE 4 MG PO TBPK: ORAL_TABLET | ORAL | 0 refills | Status: DC

## 2021-03-25 NOTE — Discharge Instructions (Addendum)
Get help right away if: You have severe pain that is not controlled with medicine. You have swelling, redness, or pain around your ear. You have stiffness in your neck. A part of your face is not moving (paralyzed). The bone behind your ear (mastoid bone) is tender when you touch it. You develop a severe headache. 

## 2021-03-25 NOTE — ED Triage Notes (Signed)
C/o fever chills , body aches, cough  x 5 days

## 2021-03-25 NOTE — ED Provider Notes (Signed)
[facility] EMERGENCY DEPARTMENT Provider Note   CSN: [id number] Arrival date & time: 03/25/21  1317     History  Chief Complaint  Patient presents with   Generalized Body Aches    James Downs is a 56 y.o. male for flu like sxs. + coughing, sneezing, aching, RUnny nose, Ears " stopped up" and " I just feel like crap." + scratchy throat, no soreness. Patient has been taking Dayquil, with mild sxs relief. Sxs began last Friday.He has been under increased stress, mother just dies, cleaning up dusty home, executor o f the estate, feeling worse last night. Mild wheezing. Patient did not take his HTN meds  because he ran out.  HPI     Home Medications Prior to Admission medications   Medication Sig Start Date End Date Taking? Authorizing Provider  acetaminophen (TYLENOL) 500 MG tablet Take 1,000 mg by mouth every 6 (six) hours as needed.    [provider]  albuterol (VENTOLIN HFA) 108 (90 Base) MCG/ACT inhaler Inhale 1-2 puffs into the lungs every 6 (six) hours as needed for wheezing or shortness of breath. 02/22/20   Long, Arlyss Repress, MD  allopurinol (ZYLOPRIM) 100 MG tablet Take 100 mg by mouth daily. 05/04/19   [provider]  benzonatate (TESSALON) 100 MG capsule Take 1 capsule (100 mg total) by mouth 3 (three) times daily as needed for cough. 02/22/20   Long, Arlyss Repress, MD  hydrocortisone (ANUSOL-HC) 25 MG suppository Place 1 suppository (25 mg total) rectally 2 (two) times daily. Patient not taking: No sig reported 08/09/19   Bethel Born, PA-C  ibuprofen (ADVIL) 600 MG tablet Take 1 tablet (600 mg total) by mouth every 6 (six) hours as needed. Patient not taking: No sig reported 06/17/19   Jacalyn Lefevre, MD  methylPREDNISolone (MEDROL DOSEPAK) 4 MG TBPK tablet Use as directed. 04/16/20   Lorelee New, PA-C  morphine (MSIR) 15 MG tablet Take 0.5 tablets (7.5 mg total) by mouth every 12 (twelve) hours as needed for severe pain. Patient not  taking: No sig reported 08/16/19   Couture, Cortni S, PA-C  ondansetron (ZOFRAN ODT) 4 MG disintegrating tablet Take 1 tablet (4 mg total) by mouth every 8 (eight) hours as needed for nausea or vomiting. 08/09/19   Bethel Born, PA-C  ondansetron (ZOFRAN ODT) 4 MG disintegrating tablet Take 1 tablet (4 mg total) by mouth every 8 (eight) hours as needed for nausea or vomiting. 04/10/20   Gerhard Munch, MD  PARoxetine (PAXIL) 40 MG tablet Take 40 mg by mouth daily. 03/15/18   [provider]  telmisartan (MICARDIS) 20 MG tablet Take 1 tablet (20 mg total) by mouth daily. 04/26/17   Dartha Lodge, PA-C  tiZANidine (ZANAFLEX) 4 MG tablet Take 1 tablet (4 mg total) by mouth every 6 (six) hours as needed for muscle spasms. 09/16/20   Dartha Lodge, PA-C      Allergies    Other    Review of Systems   Review of Systems As per the HPI Physical Exam Updated Vital Signs BP (!) 186/113 (BP Location: Right Arm)    Pulse 81    Temp 98 F (36.7 C) (Oral)    Resp 16    Ht 5\' 7"  (1.702 m)    Wt 95.3 kg    SpO2 99%    BMI 32.89 kg/m  Physical Exam Vitals and nursing note reviewed.  Constitutional:      General: He is not in  acute distress.    Appearance: He is well-developed. He is ill-appearing. He is not diaphoretic.  HENT:     Head: Normocephalic and atraumatic.     Right Ear: Tympanic membrane, ear canal and external ear normal.     Ears:     Comments: Left TM bulging, erythematous and opacified No swelling or pain in the mastoid process    Nose: Congestion present.  Eyes:     General: No scleral icterus.    Conjunctiva/sclera: Conjunctivae normal.  Cardiovascular:     Rate and Rhythm: Normal rate and regular rhythm.     Heart sounds: Normal heart sounds.  Pulmonary:     Effort: Pulmonary effort is normal.     Breath sounds: Wheezing present.  Abdominal:     Palpations: Abdomen is soft.     Tenderness: There is no abdominal tenderness.  Musculoskeletal:     Cervical back:  Normal range of motion and neck supple.  Skin:    General: Skin is warm and dry.  Neurological:     Mental Status: He is alert.  Psychiatric:        Behavior: Behavior normal.    ED Results / Procedures / Treatments   Labs (all labs ordered are listed, but only abnormal results are displayed) Labs Reviewed  RESP PANEL BY RT-PCR (FLU A&B, COVID) ARPGX2    EKG None  Radiology No results found.  Procedures Procedures    Medications Ordered in ED Medications - No data to display  ED Course/ Medical Decision Making/ A&P                           Medical Decision Making Patient here with flulike symptoms found to have acute otitis media on the left side.  I reviewed respiratory panel ordered at triage which is negative for COVID or influenza.  Patient has some mild expiratory wheezing and has an inhaler at home.  He is a daily smoker and we did discuss cessation of smoking here in the emergency department.  Patient be discharged with antibiotics, steroid taper and a refill on his regular antihypertensive medications with encouragement to see his primary care physician for further evaluation.  Return precautions given.  Risk Prescription drug management.  ED Diagnoses Final diagnoses:  None    Rx / DC Orders ED Discharge Orders     None         Arthor Captain, PA-C 03/25/21 1631    Pricilla Loveless, MD 03/26/21 [phone]

## 2021-05-15 ENCOUNTER — Other Ambulatory Visit (HOSPITAL_COMMUNITY): Payer: Self-pay

## 2021-09-02 IMAGING — CT CT ABD-PELV W/ CM
2 of 5 series · 14 of 46 positions shown, 16 images · IV contrast (omnipaque)
Comparison: CT abdomen and pelvis 06/13/2018

CLINICAL DATA: Rectal bleeding for 4 days abdominal and back pain

EXAM:
CT ABDOMEN AND PELVIS WITH CONTRAST
TECHNIQUE: Multidetector CT imaging of the abdomen and pelvis was performed
using the standard protocol following bolus administration of
intravenous contrast.
CONTRAST:  100mL OMNIPAQUE IOHEXOL 300 MG/ML  SOLN

[Series 2: axial st · axial · 0.98mm/px · z∈[-630,-60]mm · 11 of 126 slices shown, 13 images]
[im 6/126  soft-tissue]
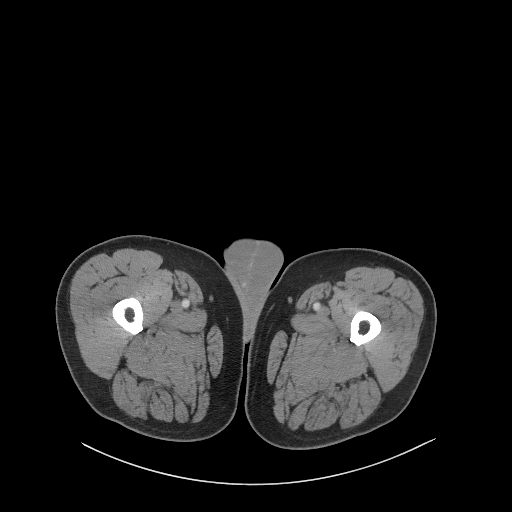
[im 6/126  bone]
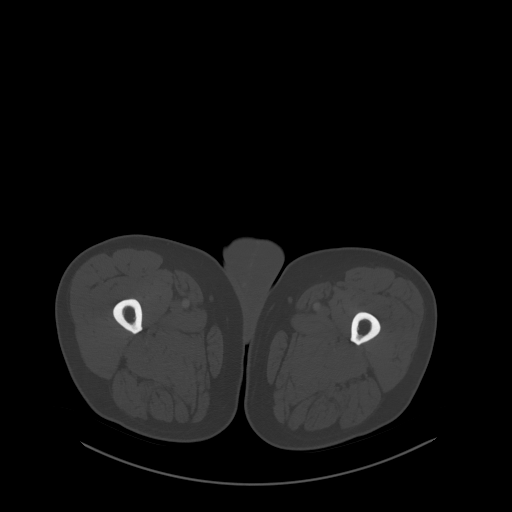
[im 18/126  soft-tissue]
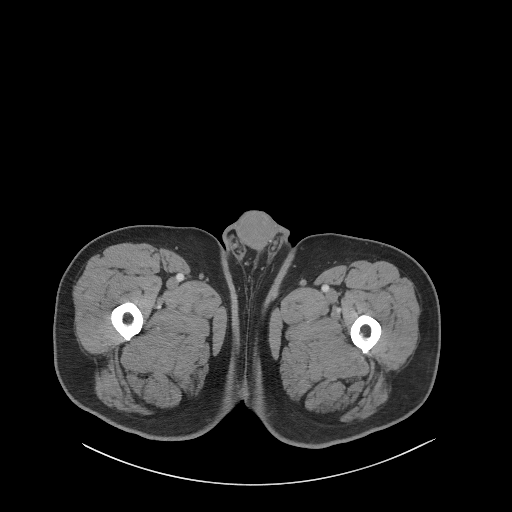
[im 30/126  soft-tissue]
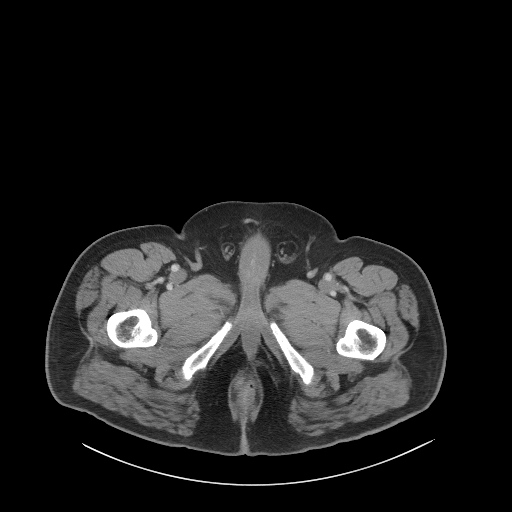
[im 42/126  soft-tissue]
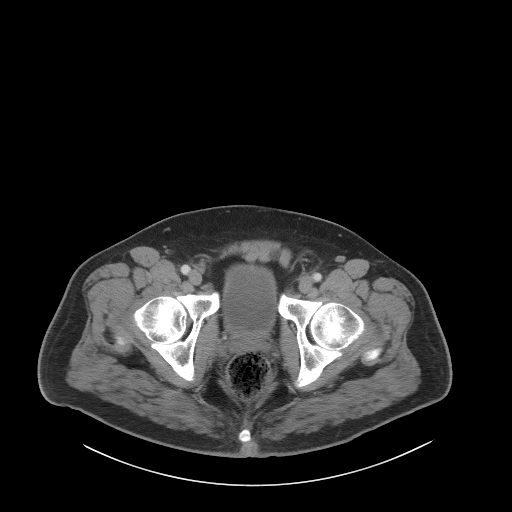
[im 54/126  soft-tissue]
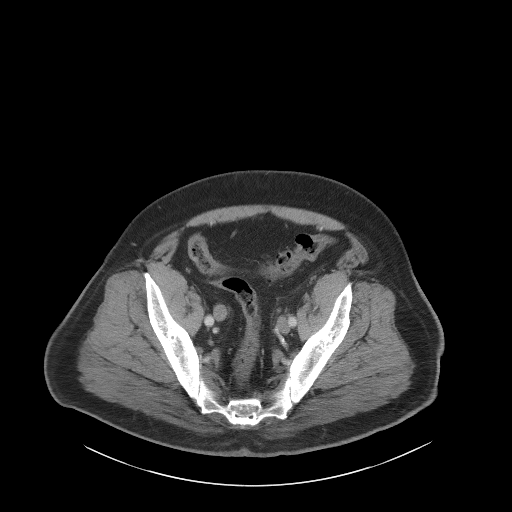
[im 66/126  soft-tissue]
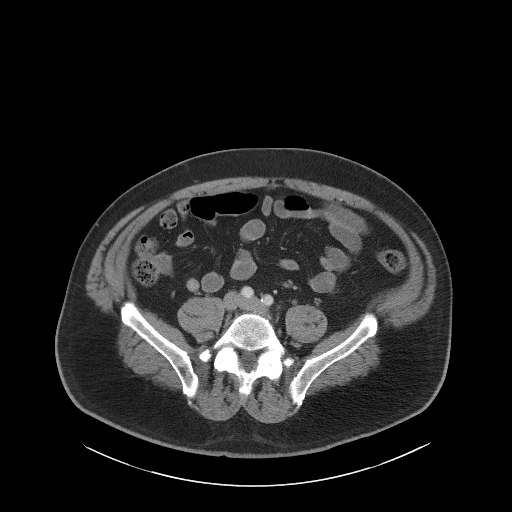
[im 72/126  soft-tissue]
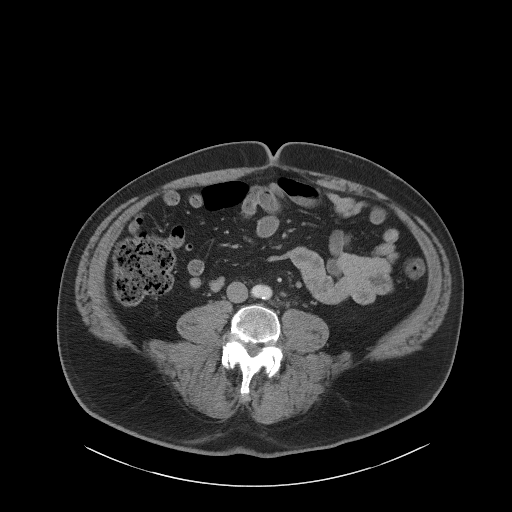
[im 84/126  soft-tissue]
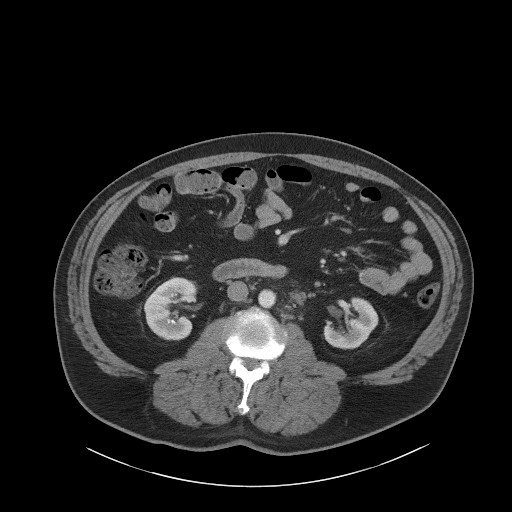
[im 96/126  soft-tissue]
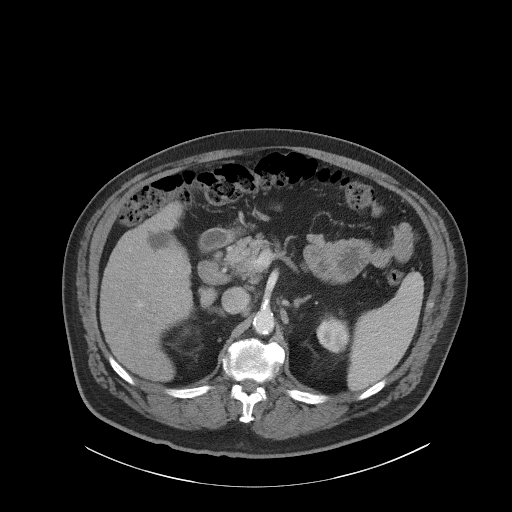
[im 96/126  bone]
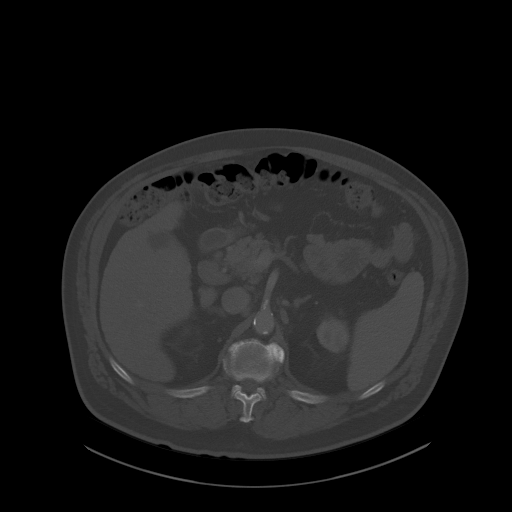
[im 108/126  soft-tissue]
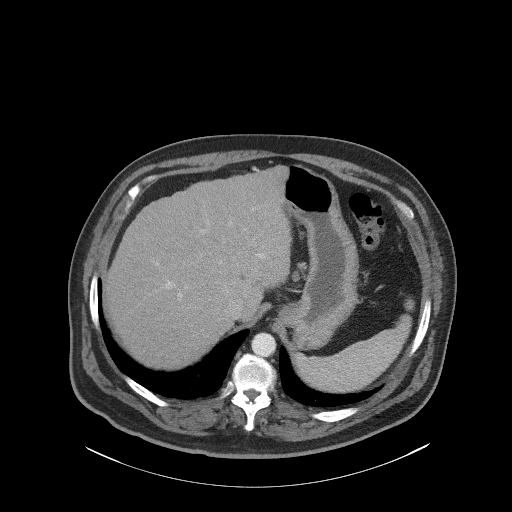
[im 120/126  soft-tissue]
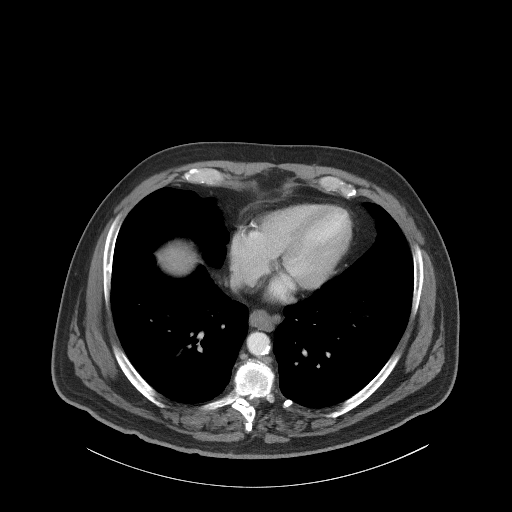

[Series 5: coronal st · coronal · 0.88mm/px · 3 of 108 slices shown]
[im 36/108  soft-tissue]
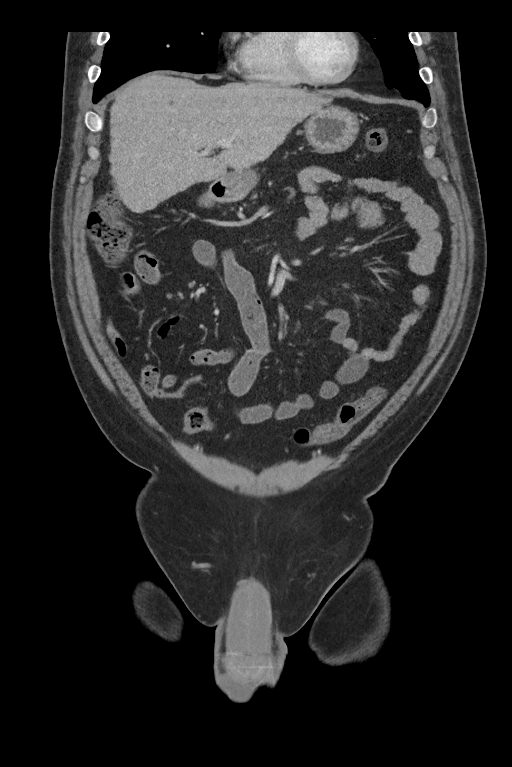
[im 48/108  soft-tissue]
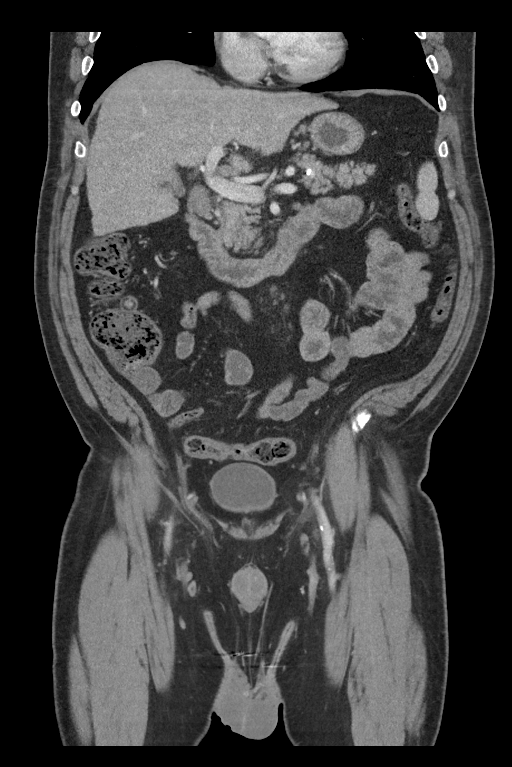
[im 60/108  soft-tissue]
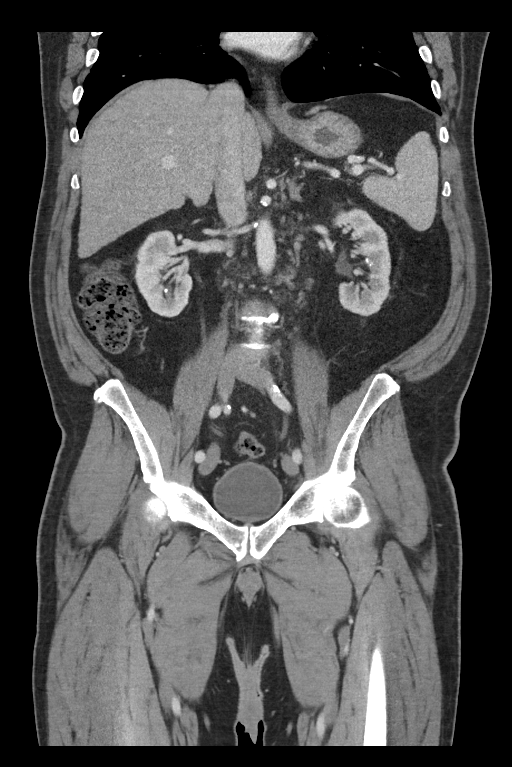

[14 of 46 positions shown; findings below may reference images not displayed]

FINDINGS: Lower chest: Dependent atelectatic changes in the lung bases.
Additional bandlike areas of opacity may reflect further atelectasis
or scarring. Normal heart size. No pericardial effusion. Coronary
artery calcifications are present.

Hepatobiliary: Nodular hepatic surface contour. No focal concerning
liver lesions. Hepatic attenuation remains within normal limits.
Normal gallbladder. No visible calcified gallstones. No biliary
ductal dilatation.

Pancreas: Partial fatty replacement of the pancreas. No pancreatic
inflammation, ductal dilatation or discernible lesions.

Spleen: Normal splenic size. No concerning lesions. Small accessory
splenule superiorly.

Adrenals/Urinary Tract: Normal adrenal glands. Kidneys enhance
uniformly and excrete symmetrically. Few subcentimeter
hypoattenuating foci in both kidneys too small to fully characterize
on CT imaging but statistically likely benign. Bilateral
nonobstructing nephrolithiasis. No obstructive urolithiasis. No
hydronephrosis. Mild bilateral symmetric perinephric stranding, a
nonspecific finding. Circumferential bladder wall thickening,
possibly related to underdistention.

Stomach/Bowel: Mild thickening versus underdistention of the distal
thoracic esophagus, correlate for symptoms. Stomach and duodenum are
unremarkable. No small bowel thickening or dilatation. A normal
appendix is visualized. No colonic dilatation or wall thickening.

Vascular/Lymphatic: There is faint periaortic hazy stranding within
the retroperitoneum with clustered and numerous lymph nodes
including several borderline enlarged nodes for instance a 14 mm
left periaortic node (2/42) and a smaller 10 mm anterior periaortic
node (2/36). No other suspicious adenopathy. Atherosclerotic
calcifications throughout the abdominal aorta and branch vessels. No
aneurysm or ectasia.

Reproductive: The prostate and seminal vesicles are unremarkable.

Other: No abdominopelvic free fluid or free gas. No bowel containing
hernias.

Musculoskeletal: No acute osseous abnormality or suspicious osseous
lesion. Multilevel degenerative changes are present in the imaged
portions of the spine. Mild retrolisthesis L2 on 3 is unchanged from
prior. Multiple bone islands in the proximal femora and Miguelina.
IMPRESSION: 1. Numerous and borderline enlarged retroperitoneal lymph nodes with
some faint retroperitoneal hazy stranding. Such an appearance could
be seen as an early stage of retroperitoneal fibrosis, early
features of a lymphoproliferative disorder, or potentially a
reactive process. Recommend close clinical assessment for features
of the above differential considerations and consider further
evaluation with outpatient PET-CT.
2. Mild thickening versus underdistention of the distal thoracic
esophagus, correlate for symptoms of esophagitis.
3. Bilateral nonobstructing nephrolithiasis.
4. Circumferential bladder wall thickening, possibly related to
underdistention. Though given concomitant mild nonspecific
perinephric stranding correlation with urinalysis to exclude
cystitis and ascending tract infection.
5. Nodular hepatic surface contour, suggestive of cirrhosis.
Correlate with history and serologies if felt to be clinically
appropriate.
6. Aortic Atherosclerosis ([id number].C).

## 2022-04-29 ENCOUNTER — Telehealth: Payer: Self-pay

## 2022-04-29 NOTE — Telephone Encounter (Signed)
Mychart msg sent

## 2022-10-18 ENCOUNTER — Other Ambulatory Visit: Payer: Self-pay

## 2022-10-18 ENCOUNTER — Encounter (HOSPITAL_BASED_OUTPATIENT_CLINIC_OR_DEPARTMENT_OTHER): Payer: Self-pay | Admitting: Emergency Medicine

## 2022-10-18 ENCOUNTER — Emergency Department (HOSPITAL_BASED_OUTPATIENT_CLINIC_OR_DEPARTMENT_OTHER)
Admission: EM | Admit: 2022-10-18 | Discharge: 2022-10-19 | Disposition: A | Discharge status: Home / Self Care | Payer: 59 | Attending: Emergency Medicine | Admitting: Emergency Medicine

## 2022-10-18 DIAGNOSIS — I10 Essential (primary) hypertension: Secondary | ICD-10-CM | POA: Insufficient documentation

## 2022-10-18 DIAGNOSIS — Z20822 Contact with and (suspected) exposure to covid-19: Secondary | ICD-10-CM | POA: Insufficient documentation

## 2022-10-18 DIAGNOSIS — Z79899 Other long term (current) drug therapy: Secondary | ICD-10-CM | POA: Diagnosis not present

## 2022-10-18 DIAGNOSIS — J40 Bronchitis, not specified as acute or chronic: Secondary | ICD-10-CM

## 2022-10-18 DIAGNOSIS — R079 Chest pain, unspecified: Secondary | ICD-10-CM | POA: Diagnosis not present

## 2022-10-18 DIAGNOSIS — L03818 Cellulitis of other sites: Secondary | ICD-10-CM | POA: Diagnosis not present

## 2022-10-18 DIAGNOSIS — R059 Cough, unspecified: Secondary | ICD-10-CM | POA: Diagnosis not present

## 2022-10-18 NOTE — ED Triage Notes (Signed)
Pt reports he has a sore throat, cough and congestion.  He states he is currently moving and feels that it is his allergies and that the move it just making it worse w/ all the dust.  States his chest hurts, muscle ache b/c of all the coughing. This all started on Wednesday.    Does not currently have his bp medications (months).

## 2022-10-18 NOTE — ED Provider Notes (Signed)
[place] EMERGENCY DEPARTMENT AT [facility] Provider Note   CSN: [id number] Arrival date & time: 10/18/22  2320     History {Add pertinent medical, surgical, social history, OB history to HPI:1} Chief Complaint  Patient presents with   Cough   Sore Throat    James Downs is a 57 y.o. male.  The history is provided by the patient.  Cough Sore Throat  James Downs is a 57 y.o. male who presents to the Emergency Department complaining of *** Thinks its allergies Women made him come in.  Cough, slightly productive. Ha. Sore throat. Chest sore from coughing - described as a muscle ache.  Started Wednesday.   No fever. Slight sob. No AP, N/V/D, leg swelling/pain.   Hx/o HTN Been off of meds for several months. Due to money and time.      Home Medications Prior to Admission medications   Medication Sig Start Date End Date Taking? Authorizing Provider  acetaminophen (TYLENOL) 500 MG tablet Take 1,000 mg by mouth every 6 (six) hours as needed.    [provider]  albuterol (VENTOLIN HFA) 108 (90 Base) MCG/ACT inhaler Inhale 1-2 puffs into the lungs every 6 (six) hours as needed for wheezing or shortness of breath. 02/22/20   Long, Arlyss Repress, MD  allopurinol (ZYLOPRIM) 100 MG tablet Take 100 mg by mouth daily. 05/04/19   [provider]  benzonatate (TESSALON) 100 MG capsule Take 1 capsule (100 mg total) by mouth 3 (three) times daily as needed for cough. 02/22/20   Long, Arlyss Repress, MD  hydrocortisone (ANUSOL-HC) 25 MG suppository Place 1 suppository (25 mg total) rectally 2 (two) times daily. Patient not taking: No sig reported 08/09/19   Bethel Born, PA-C  ibuprofen (ADVIL) 600 MG tablet Take 1 tablet (600 mg total) by mouth every 6 (six) hours as needed. Patient not taking: No sig reported 06/17/19   Jacalyn Lefevre, MD  methylPREDNISolone (MEDROL DOSEPAK) 4 MG TBPK tablet Use as directed. 04/16/20   Lorelee New, PA-C   methylPREDNISolone (MEDROL DOSEPAK) 4 MG TBPK tablet Use as directed 03/25/21   Arthor Captain, PA-C  morphine (MSIR) 15 MG tablet Take 0.5 tablets (7.5 mg total) by mouth every 12 (twelve) hours as needed for severe pain. Patient not taking: No sig reported 08/16/19   Couture, Cortni S, PA-C  ondansetron (ZOFRAN ODT) 4 MG disintegrating tablet Take 1 tablet (4 mg total) by mouth every 8 (eight) hours as needed for nausea or vomiting. 08/09/19   Bethel Born, PA-C  ondansetron (ZOFRAN ODT) 4 MG disintegrating tablet Take 1 tablet (4 mg total) by mouth every 8 (eight) hours as needed for nausea or vomiting. 04/10/20   Gerhard Munch, MD  PARoxetine (PAXIL) 40 MG tablet Take 40 mg by mouth daily. 03/15/18   [provider]  telmisartan (MICARDIS) 20 MG tablet Take 1 tablet (20 mg total) by mouth daily. 03/25/21   Harris, Cammy Copa, PA-C  tiZANidine (ZANAFLEX) 4 MG tablet Take 1 tablet (4 mg total) by mouth every 6 (six) hours as needed for muscle spasms. 09/16/20   Dartha Lodge, PA-C      Allergies    Other    Review of Systems   Review of Systems  Respiratory:  Positive for cough.   All other systems reviewed and are negative.   Physical Exam Updated Vital Signs BP (!) 183/108 (BP Location: Right Arm)   Pulse 86   Temp 98.2 F (36.8 C) (  Oral)   Resp 18   Ht 5\' 6"  (1.676 m)   Wt 104.3 kg   SpO2 99%   BMI 37.12 kg/m  Physical Exam Vitals and nursing note reviewed.  Constitutional:      Appearance: He is well-developed.  HENT:     Head: Normocephalic and atraumatic.     Comments: Mild erythema in the posterior OP Cardiovascular:     Rate and Rhythm: Normal rate and regular rhythm.     Heart sounds: No murmur heard. Pulmonary:     Effort: Pulmonary effort is normal. No respiratory distress.     Breath sounds: Normal breath sounds.  Abdominal:     Palpations: Abdomen is soft.     Tenderness: There is no abdominal tenderness. There is no guarding or rebound.   Musculoskeletal:        General: No swelling or tenderness.  Skin:    General: Skin is warm and dry.  Neurological:     Mental Status: He is alert and oriented to person, place, and time.  Psychiatric:        Behavior: Behavior normal.     ED Results / Procedures / Treatments   Labs (all labs ordered are listed, but only abnormal results are displayed) Labs Reviewed - No data to display  EKG None  Radiology No results found.  Procedures Procedures  {Document cardiac monitor, telemetry assessment procedure when appropriate:1}  Medications Ordered in ED Medications - No data to display  ED Course/ Medical Decision Making/ A&P   {   Click here for ABCD2, HEART and other calculatorsREFRESH Note before signing :1}                              Medical Decision Making  ***  {Document critical care time when appropriate:1} {Document review of labs and clinical decision tools ie heart score, Chads2Vasc2 etc:1}  {Document your independent review of radiology images, and any outside records:1} {Document your discussion with family members, caretakers, and with consultants:1} {Document social determinants of health affecting pt's care:1} {Document your decision making why or why not admission, treatments were needed:1} Final Clinical Impression(s) / ED Diagnoses Final diagnoses:  None    Rx / DC Orders ED Discharge Orders     None

## 2022-10-19 ENCOUNTER — Emergency Department (HOSPITAL_BASED_OUTPATIENT_CLINIC_OR_DEPARTMENT_OTHER): Payer: 59

## 2022-10-19 DIAGNOSIS — R079 Chest pain, unspecified: Secondary | ICD-10-CM | POA: Diagnosis not present

## 2022-10-19 DIAGNOSIS — R059 Cough, unspecified: Secondary | ICD-10-CM | POA: Diagnosis not present

## 2022-10-19 DIAGNOSIS — J40 Bronchitis, not specified as acute or chronic: Secondary | ICD-10-CM | POA: Diagnosis not present

## 2022-10-19 LAB — BASIC METABOLIC PANEL
Anion gap: 7 (ref 5–15)
BUN: 11 mg/dL (ref 6–20)
CO2: 23 mmol/L (ref 22–32)
Calcium: 8.3 mg/dL — ABNORMAL LOW (ref 8.9–10.3)
Chloride: 106 mmol/L (ref 98–111)
Creatinine, Ser: 1.34 mg/dL — ABNORMAL HIGH (ref 0.61–1.24)
GFR, Estimated: >60 mL/min (ref >60)
Glucose, Bld: 99 mg/dL (ref 70–99)
Potassium: 3.8 mmol/L (ref 3.5–5.1)
Sodium: 136 mmol/L (ref 135–145)

## 2022-10-19 LAB — CBC WITH DIFFERENTIAL/PLATELET
Abs Immature Granulocytes: 0.01 10*3/uL (ref 0.00–0.07)
Basophils Absolute: 0 10*3/uL (ref 0.0–0.1)
Basophils Relative: 0 %
Eosinophils Absolute: 0 10*3/uL (ref 0.0–0.5)
Eosinophils Relative: 0 %
HCT: 46.2 % (ref 39.0–52.0)
Hemoglobin: 15.7 g/dL (ref 13.0–17.0)
Immature Granulocytes: 0 %
Lymphocytes Relative: 25 %
Lymphs Abs: 1.5 10*3/uL (ref 0.7–4.0)
MCH: 29.7 pg (ref 26.0–34.0)
MCHC: 34 g/dL (ref 30.0–36.0)
MCV: 87.3 fL (ref 80.0–100.0)
Monocytes Absolute: 0.7 10*3/uL (ref 0.1–1.0)
Monocytes Relative: 12 %
Neutro Abs: 3.8 10*3/uL (ref 1.7–7.7)
Neutrophils Relative %: 63 %
Platelets: 156 10*3/uL (ref 150–400)
RBC: 5.29 MIL/uL (ref 4.22–5.81)
RDW: 13.5 % (ref 11.5–15.5)
WBC: 6 10*3/uL (ref 4.0–10.5)
nRBC: 0 % (ref 0.0–0.2)

## 2022-10-19 LAB — SARS CORONAVIRUS 2 BY RT PCR: SARS Coronavirus 2 by RT PCR: NEGATIVE

## 2022-10-19 LAB — TROPONIN I (HIGH SENSITIVITY): Troponin I (High Sensitivity): 9 ng/L (ref <18)

## 2022-10-19 MED ORDER — DOXYCYCLINE HYCLATE 100 MG PO CAPS: 100.0000 mg | ORAL_CAPSULE | Freq: Two times a day (BID) | ORAL | 0 refills | Status: DC

## 2022-10-19 MED ORDER — AMLODIPINE BESYLATE 2.5 MG PO TABS: 2.5000 mg | ORAL_TABLET | Freq: Every day | ORAL | 1 refills | Status: DC

## 2022-10-19 NOTE — ED Notes (Signed)
Pt to Xray.

## 2022-10-19 NOTE — Discharge Instructions (Addendum)
You may take Allegra, Claritin, or Zyrtec available over-the-counter according to label instructions for allergies.

## 2023-04-27 ENCOUNTER — Encounter (HOSPITAL_BASED_OUTPATIENT_CLINIC_OR_DEPARTMENT_OTHER): Payer: Self-pay | Admitting: Emergency Medicine

## 2023-04-27 ENCOUNTER — Other Ambulatory Visit: Payer: Self-pay

## 2023-04-27 ENCOUNTER — Emergency Department (HOSPITAL_BASED_OUTPATIENT_CLINIC_OR_DEPARTMENT_OTHER)
Admission: EM | Admit: 2023-04-27 | Discharge: 2023-04-27 | Disposition: A | Discharge status: Home / Self Care | Attending: Emergency Medicine | Admitting: Emergency Medicine

## 2023-04-27 DIAGNOSIS — Z79899 Other long term (current) drug therapy: Secondary | ICD-10-CM | POA: Diagnosis not present

## 2023-04-27 DIAGNOSIS — B9789 Other viral agents as the cause of diseases classified elsewhere: Secondary | ICD-10-CM | POA: Diagnosis not present

## 2023-04-27 DIAGNOSIS — B09 Unspecified viral infection characterized by skin and mucous membrane lesions: Secondary | ICD-10-CM | POA: Insufficient documentation

## 2023-04-27 DIAGNOSIS — J988 Other specified respiratory disorders: Secondary | ICD-10-CM | POA: Diagnosis not present

## 2023-04-27 DIAGNOSIS — I10 Essential (primary) hypertension: Secondary | ICD-10-CM | POA: Insufficient documentation

## 2023-04-27 DIAGNOSIS — J069 Acute upper respiratory infection, unspecified: Secondary | ICD-10-CM | POA: Diagnosis not present

## 2023-04-27 DIAGNOSIS — R21 Rash and other nonspecific skin eruption: Secondary | ICD-10-CM | POA: Diagnosis present

## 2023-04-27 LAB — RESP PANEL BY RT-PCR (RSV, FLU A&B, COVID)  RVPGX2
Influenza A by PCR: NEGATIVE
Influenza B by PCR: NEGATIVE
Resp Syncytial Virus by PCR: NEGATIVE
SARS Coronavirus 2 by RT PCR: NEGATIVE

## 2023-04-27 MED ORDER — PREDNISONE 20 MG PO TABS
40.0000 mg | ORAL_TABLET | Freq: Once | ORAL | Status: AC
  Administered 2023-04-27: 40 mg via ORAL
  Filled 2023-04-27: qty 2

## 2023-04-27 MED ORDER — PREDNISONE 20 MG PO TABS: 40.0000 mg | ORAL_TABLET | Freq: Every day | ORAL | 0 refills | Status: DC

## 2023-04-27 NOTE — ED Provider Notes (Signed)
 [place] EMERGENCY DEPARTMENT AT [facility] Provider Note   CSN: [id number] Arrival date & time: 04/27/23  1711     History  Chief Complaint  Patient presents with   Rash    James Downs is a 58 y.o. male.  Patient with history of hypertension, hyperlipidemia, history of degenerative disc disease and radiculopathy --presents to the emergency department today for evaluation of 6 days of chest congestion and cough as well as nasal congestion and runny nose.  He also developed a rash that involves his head and face, back and torso, less so on the arms and legs.  Rash is neither that painful or that itchy.  No intraoral lesions.  He denies tick bites.  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  No difficulty breathing or shortness of breath.  Denies fever.  He has a "low-grade headache".  No vomiting or confusion.  Wife has similar symptoms except for the rash.       Home Medications Prior to Admission medications   Medication Sig Start Date End Date Taking? Authorizing Provider  predniSONE (DELTASONE) 20 MG tablet Take 2 tablets (40 mg total) by mouth daily. 04/27/23  Yes Renne Crigler, PA-C  acetaminophen (TYLENOL) 500 MG tablet Take 1,000 mg by mouth every 6 (six) hours as needed.    [provider]  albuterol (VENTOLIN HFA) 108 (90 Base) MCG/ACT inhaler Inhale 1-2 puffs into the lungs every 6 (six) hours as needed for wheezing or shortness of breath. 02/22/20   Long, Arlyss Repress, MD  allopurinol (ZYLOPRIM) 100 MG tablet Take 100 mg by mouth daily. 05/04/19   [provider]  amLODipine (NORVASC) 2.5 MG tablet Take 1 tablet (2.5 mg total) by mouth daily. 10/19/22   Tilden Fossa, MD  benzonatate (TESSALON) 100 MG capsule Take 1 capsule (100 mg total) by mouth 3 (three) times daily as needed for cough. 02/22/20   Long, Arlyss Repress, MD  doxycycline (VIBRAMYCIN) 100 MG capsule Take 1 capsule (100 mg total) by mouth 2  (two) times daily. 10/19/22   Tilden Fossa, MD  ondansetron (ZOFRAN ODT) 4 MG disintegrating tablet Take 1 tablet (4 mg total) by mouth every 8 (eight) hours as needed for nausea or vomiting. 08/09/19   Bethel Born, PA-C  ondansetron (ZOFRAN ODT) 4 MG disintegrating tablet Take 1 tablet (4 mg total) by mouth every 8 (eight) hours as needed for nausea or vomiting. 04/10/20   Gerhard Munch, MD  PARoxetine (PAXIL) 40 MG tablet Take 40 mg by mouth daily. 03/15/18   [provider]  telmisartan (MICARDIS) 20 MG tablet Take 1 tablet (20 mg total) by mouth daily. 03/25/21   Harris, Cammy Copa, PA-C  tiZANidine (ZANAFLEX) 4 MG tablet Take 1 tablet (4 mg total) by mouth every 6 (six) hours as needed for muscle spasms. 09/16/20   Rosezella Rumpf, PA-C      Allergies    Other    Review of Systems   Review of Systems  Physical Exam Updated Vital Signs BP 135/86 (BP Location: Left Arm)   Pulse 86   Temp 97.8 F (36.6 C) (Oral)   Resp 18   Ht 5\' 6"  (1.676 m)   Wt 104.3 kg   SpO2 100%   BMI 37.12 kg/m   Physical Exam Vitals and nursing note reviewed.  Constitutional:      General: He is not in acute distress.    Appearance: He is well-developed.  HENT:     Head: Normocephalic and atraumatic.     Nose: Congestion present. No rhinorrhea.     Mouth/Throat:     Mouth: Mucous membranes are moist.     Comments: No intraoral lesions. Eyes:     General:        Right eye: No discharge.        Left eye: No discharge.     Conjunctiva/sclera: Conjunctivae normal.  Cardiovascular:     Rate and Rhythm: Normal rate and regular rhythm.     Heart sounds: Normal heart sounds.  Pulmonary:     Effort: Pulmonary effort is normal.     Breath sounds: Normal breath sounds.  Abdominal:     Palpations: Abdomen is soft.     Tenderness: There is no abdominal tenderness. There is no guarding or rebound.  Musculoskeletal:     Cervical back: Normal range of motion and neck supple.  Skin:     General: Skin is warm and dry.     Findings: Rash present.     Comments: Macular papular rash noted over the scalp, face, neck, chest and back, less so on proximal arms and legs.  Spares palms.  No intraoral lesions.  Neurological:     Mental Status: He is alert.     ED Results / Procedures / Treatments   Labs (all labs ordered are listed, but only abnormal results are displayed) Labs Reviewed  RESP PANEL BY RT-PCR (RSV, FLU A&B, COVID)  RVPGX2    EKG None  Radiology No results found.  Procedures Procedures    Medications Ordered in ED Medications  predniSONE (DELTASONE) tablet 40 mg (40 mg Oral Given 04/27/23 2221)    ED Course/ Medical Decision Making/ A&P    Patient seen and examined. History obtained directly from patient. Work-up including labs, imaging, EKG ordered in triage, if performed, were reviewed.    Labs/EKG: Independently reviewed and interpreted.  This included: Viral panel, agree negative.  Imaging: None ordered  Medications/Fluids: Ordered: 40 mg prednisone  Most recent vital signs reviewed and are as follows: BP 135/86 (BP Location: Left Arm)   Pulse 86   Temp 97.8 F (36.6 C) (Oral)   Resp 18   Ht 5\' 6"  (1.676 m)   Wt 104.3 kg   SpO2 100%   BMI 37.12 kg/m   Initial impression: Viral respiratory symptom with rash, likely viral exanthem of some nature.  Patient looks well, nontoxic.  No signs of meningitis.  Home treatment plan: 5 total days of prednisone, continue OTC meds  Return instructions discussed with patient: Return with more severe symptoms, difficulty breathing or swallowing.  Follow-up instructions discussed with patient: PCP in 3 days                                Medical Decision Making Risk Prescription drug management.   Very well-appearing patient, nontoxic, with rash and mild respiratory symptoms.  Does not appear to be coxsackie.  No intraoral or mucosal involvement.  No new medications recently or antibiotics.   No tick involvement.  Suspect most likely infectious, less likely allergic.  Will trial prednisone.  Expectant management encouraged given well appearance at this time.        Final Clinical Impression(s) / ED Diagnoses Final diagnoses:  Viral exanthem  Respiratory infection    Rx / DC Orders ED Discharge Orders  Ordered    predniSONE (DELTASONE) 20 MG tablet  Daily        04/27/23 2215              Renne Crigler, PA-C 04/27/23 2226    Arby Barrette, MD 05/03/23 [phone]

## 2023-04-27 NOTE — ED Triage Notes (Addendum)
 Rash on stomach neck and forehead  has been there a weekm has been stuffy and coughing

## 2023-04-27 NOTE — Discharge Instructions (Signed)
 Please read and follow all provided instructions.  Your diagnoses today include:  1. Viral exanthem   2. Respiratory infection     You appear to have an upper respiratory infection (URI). An upper respiratory tract infection, or cold, is a viral infection of the air passages leading to the lungs. It should improve gradually after 5-7 days. You may have a lingering cough that lasts for 2- 4 weeks after the infection.  Tests performed today include: Vital signs. See below for your results today.  Viral panel was negative for flu, COVID, RSV  Medications prescribed:  Prednisone - steroid medicine   It is best to take this medication in the morning to prevent sleeping problems. If you are diabetic, monitor your blood sugar closely and stop taking Prednisone if blood sugar is over 300. Take with food to prevent stomach upset.   Take any prescribed medications only as directed. Treatment for your infection is aimed at treating the symptoms. There are no medications, such as antibiotics, that will cure your infection.   Home care instructions:  You can take Tylenol and/or Ibuprofen as directed on the packaging for fever reduction and pain relief.    For cough: honey 1/2 to 1 teaspoon (you can dilute the honey in water or another fluid).  You can also use guaifenesin and dextromethorphan for cough. You can use a humidifier for chest congestion and cough.  If you don't have a humidifier, you can sit in the bathroom with the hot shower running.      For sore throat: try warm salt water gargles, cepacol lozenges, throat spray, warm tea or water with lemon/honey, popsicles or ice, or OTC cold relief medicine for throat discomfort.    For congestion: take a daily anti-histamine like Zyrtec, Claritin, and a oral decongestant, such as pseudoephedrine.  You can also use Flonase 1-2 sprays in each nostril daily.    It is important to stay hydrated: drink plenty of fluids (water,  gatorade/powerade/pedialyte, juices, or teas) to keep your throat moisturized and help further relieve irritation/discomfort.   Your illness is contagious and can be spread to others, especially during the first 3 or 4 days. It cannot be cured by antibiotics or other medicines. Take basic precautions such as washing your hands often, covering your mouth when you cough or sneeze, and avoiding public places where you could spread your illness to others.   Please continue drinking plenty of fluids.  Use over-the-counter medicines as needed as directed on packaging for symptom relief.  You may also use ibuprofen or tylenol as directed on packaging for pain or fever.  Do not take multiple medicines containing Tylenol or acetaminophen to avoid taking too much of this medication.  Follow-up instructions: Please follow-up with your primary care provider in the next 3 days for further evaluation of your symptoms if you are not feeling better.   Return instructions:  Please return to the Emergency Department if you experience worsening symptoms.  RETURN IMMEDIATELY IF you develop shortness of breath, confusion or altered mental status, a new rash, become dizzy, faint, or poorly responsive, or are unable to be cared for at home. Please return if you have persistent vomiting and cannot keep down fluids or develop a fever that is not controlled by tylenol or motrin.   Please return if you have any other emergent concerns.  Additional Information:  Your vital signs today were: BP 135/86 (BP Location: Left Arm)   Pulse 86   Temp 97.8 F (  36.6 C) (Oral)   Resp 18   Ht 5\' 6"  (1.676 m)   Wt 104.3 kg   SpO2 100%   BMI 37.12 kg/m  If your blood pressure (BP) was elevated above 135/85 this visit, please have this repeated by your doctor within one month. --------------

## 2023-05-19 ENCOUNTER — Inpatient Hospital Stay (HOSPITAL_BASED_OUTPATIENT_CLINIC_OR_DEPARTMENT_OTHER)
Admission: EM | Admit: 2023-05-19 | Discharge: 2023-05-22 | DRG: 641 | Disposition: A | Discharge status: Home / Self Care | Attending: Internal Medicine | Admitting: Internal Medicine

## 2023-05-19 ENCOUNTER — Emergency Department (HOSPITAL_BASED_OUTPATIENT_CLINIC_OR_DEPARTMENT_OTHER)

## 2023-05-19 ENCOUNTER — Other Ambulatory Visit: Payer: Self-pay

## 2023-05-19 ENCOUNTER — Encounter (HOSPITAL_BASED_OUTPATIENT_CLINIC_OR_DEPARTMENT_OTHER): Payer: Self-pay | Admitting: Emergency Medicine

## 2023-05-19 DIAGNOSIS — Z79899 Other long term (current) drug therapy: Secondary | ICD-10-CM

## 2023-05-19 DIAGNOSIS — I8500 Esophageal varices without bleeding: Secondary | ICD-10-CM | POA: Diagnosis present

## 2023-05-19 DIAGNOSIS — R188 Other ascites: Principal | ICD-10-CM | POA: Diagnosis present

## 2023-05-19 DIAGNOSIS — K8020 Calculus of gallbladder without cholecystitis without obstruction: Secondary | ICD-10-CM | POA: Diagnosis not present

## 2023-05-19 DIAGNOSIS — D6959 Other secondary thrombocytopenia: Secondary | ICD-10-CM | POA: Diagnosis present

## 2023-05-19 DIAGNOSIS — J301 Allergic rhinitis due to pollen: Secondary | ICD-10-CM | POA: Diagnosis present

## 2023-05-19 DIAGNOSIS — R161 Splenomegaly, not elsewhere classified: Secondary | ICD-10-CM | POA: Diagnosis not present

## 2023-05-19 DIAGNOSIS — M7989 Other specified soft tissue disorders: Secondary | ICD-10-CM | POA: Diagnosis not present

## 2023-05-19 DIAGNOSIS — Z833 Family history of diabetes mellitus: Secondary | ICD-10-CM

## 2023-05-19 DIAGNOSIS — K766 Portal hypertension: Secondary | ICD-10-CM | POA: Diagnosis present

## 2023-05-19 DIAGNOSIS — D539 Nutritional anemia, unspecified: Secondary | ICD-10-CM | POA: Diagnosis present

## 2023-05-19 DIAGNOSIS — G4733 Obstructive sleep apnea (adult) (pediatric): Secondary | ICD-10-CM | POA: Diagnosis present

## 2023-05-19 DIAGNOSIS — I10 Essential (primary) hypertension: Secondary | ICD-10-CM | POA: Diagnosis present

## 2023-05-19 DIAGNOSIS — R590 Localized enlarged lymph nodes: Secondary | ICD-10-CM | POA: Diagnosis not present

## 2023-05-19 DIAGNOSIS — J9811 Atelectasis: Secondary | ICD-10-CM | POA: Diagnosis not present

## 2023-05-19 DIAGNOSIS — R0602 Shortness of breath: Secondary | ICD-10-CM | POA: Diagnosis not present

## 2023-05-19 DIAGNOSIS — Z1152 Encounter for screening for COVID-19: Secondary | ICD-10-CM

## 2023-05-19 DIAGNOSIS — R601 Generalized edema: Secondary | ICD-10-CM | POA: Diagnosis not present

## 2023-05-19 DIAGNOSIS — R21 Rash and other nonspecific skin eruption: Secondary | ICD-10-CM | POA: Diagnosis present

## 2023-05-19 DIAGNOSIS — M109 Gout, unspecified: Secondary | ICD-10-CM | POA: Diagnosis present

## 2023-05-19 DIAGNOSIS — Z8249 Family history of ischemic heart disease and other diseases of the circulatory system: Secondary | ICD-10-CM

## 2023-05-19 DIAGNOSIS — E8770 Fluid overload, unspecified: Principal | ICD-10-CM | POA: Diagnosis present

## 2023-05-19 DIAGNOSIS — Z6837 Body mass index (BMI) 37.0-37.9, adult: Secondary | ICD-10-CM

## 2023-05-19 DIAGNOSIS — K7460 Unspecified cirrhosis of liver: Secondary | ICD-10-CM | POA: Diagnosis present

## 2023-05-19 DIAGNOSIS — E7800 Pure hypercholesterolemia, unspecified: Secondary | ICD-10-CM | POA: Diagnosis present

## 2023-05-19 DIAGNOSIS — K625 Hemorrhage of anus and rectum: Secondary | ICD-10-CM | POA: Diagnosis not present

## 2023-05-19 DIAGNOSIS — F32A Depression, unspecified: Secondary | ICD-10-CM | POA: Diagnosis present

## 2023-05-19 DIAGNOSIS — F17210 Nicotine dependence, cigarettes, uncomplicated: Secondary | ICD-10-CM | POA: Diagnosis present

## 2023-05-19 DIAGNOSIS — R791 Abnormal coagulation profile: Secondary | ICD-10-CM | POA: Diagnosis present

## 2023-05-19 DIAGNOSIS — R600 Localized edema: Secondary | ICD-10-CM | POA: Diagnosis not present

## 2023-05-19 LAB — PROTIME-INR
INR: 1.7 — ABNORMAL HIGH (ref 0.8–1.2)
Prothrombin Time: 19.8 s — ABNORMAL HIGH (ref 11.4–15.2)

## 2023-05-19 LAB — COMPREHENSIVE METABOLIC PANEL
ALT: 77 U/L — ABNORMAL HIGH (ref 0–44)
AST: 109 U/L — ABNORMAL HIGH (ref 15–41)
Albumin: 2.6 g/dL — ABNORMAL LOW (ref 3.5–5.0)
Alkaline Phosphatase: 98 U/L (ref 38–126)
Anion gap: 10 (ref 5–15)
BUN: 13 mg/dL (ref 6–20)
CO2: 20 mmol/L — ABNORMAL LOW (ref 22–32)
Calcium: 8.2 mg/dL — ABNORMAL LOW (ref 8.9–10.3)
Chloride: 107 mmol/L (ref 98–111)
Creatinine, Ser: 1.44 mg/dL — ABNORMAL HIGH (ref 0.61–1.24)
GFR, Estimated: 56 mL/min — ABNORMAL LOW (ref >60)
Glucose, Bld: 98 mg/dL (ref 70–99)
Potassium: 3.7 mmol/L (ref 3.5–5.1)
Sodium: 137 mmol/L (ref 135–145)
Total Bilirubin: 3.9 mg/dL — ABNORMAL HIGH (ref 0.0–1.2)
Total Protein: 7.3 g/dL (ref 6.5–8.1)

## 2023-05-19 LAB — DIFFERENTIAL
Abs Immature Granulocytes: 0.03 10*3/uL (ref 0.00–0.07)
Basophils Absolute: 0 10*3/uL (ref 0.0–0.1)
Basophils Relative: 0 %
Eosinophils Absolute: 0 10*3/uL (ref 0.0–0.5)
Eosinophils Relative: 0 %
Immature Granulocytes: 1 %
Lymphocytes Relative: 22 %
Lymphs Abs: 1.3 10*3/uL (ref 0.7–4.0)
Monocytes Absolute: 0.6 10*3/uL (ref 0.1–1.0)
Monocytes Relative: 9 %
Neutro Abs: 4.1 10*3/uL (ref 1.7–7.7)
Neutrophils Relative %: 68 %

## 2023-05-19 LAB — CBC
HCT: 38.3 % — ABNORMAL LOW (ref 39.0–52.0)
Hemoglobin: 13 g/dL (ref 13.0–17.0)
MCH: 34.8 pg — ABNORMAL HIGH (ref 26.0–34.0)
MCHC: 33.9 g/dL (ref 30.0–36.0)
MCV: 102.4 fL — ABNORMAL HIGH (ref 80.0–100.0)
Platelets: 97 10*3/uL — ABNORMAL LOW (ref 150–400)
RBC: 3.74 MIL/uL — ABNORMAL LOW (ref 4.22–5.81)
RDW: 16.8 % — ABNORMAL HIGH (ref 11.5–15.5)
WBC: 5.9 10*3/uL (ref 4.0–10.5)
nRBC: 0 % (ref 0.0–0.2)

## 2023-05-19 LAB — URINALYSIS, ROUTINE W REFLEX MICROSCOPIC
Glucose, UA: NEGATIVE mg/dL
Ketones, ur: NEGATIVE mg/dL
Nitrite: NEGATIVE
Protein, ur: NEGATIVE mg/dL
Specific Gravity, Urine: 1.025 (ref 1.005–1.030)
pH: 5.5 (ref 5.0–8.0)

## 2023-05-19 LAB — LIPASE, BLOOD: Lipase: 43 U/L (ref 11–51)

## 2023-05-19 LAB — D-DIMER, QUANTITATIVE: D-Dimer, Quant: 3.08 ug{FEU}/mL — ABNORMAL HIGH (ref 0.00–0.50)

## 2023-05-19 LAB — RESP PANEL BY RT-PCR (RSV, FLU A&B, COVID)  RVPGX2
Influenza A by PCR: NEGATIVE
Influenza B by PCR: NEGATIVE
Resp Syncytial Virus by PCR: NEGATIVE
SARS Coronavirus 2 by RT PCR: NEGATIVE

## 2023-05-19 LAB — TROPONIN I (HIGH SENSITIVITY)
Troponin I (High Sensitivity): 10 ng/L (ref <18)
Troponin I (High Sensitivity): 11 ng/L (ref <18)

## 2023-05-19 LAB — URINALYSIS, MICROSCOPIC (REFLEX)

## 2023-05-19 LAB — BRAIN NATRIURETIC PEPTIDE: B Natriuretic Peptide: 56.5 pg/mL (ref 0.0–100.0)

## 2023-05-19 MED ORDER — FUROSEMIDE 10 MG/ML IJ SOLN
20.0000 mg | Freq: Once | INTRAMUSCULAR | Status: AC
  Administered 2023-05-19: 20 mg via INTRAVENOUS
  Filled 2023-05-19: qty 2

## 2023-05-19 MED ORDER — IOHEXOL 350 MG/ML SOLN
100.0000 mL | Freq: Once | INTRAVENOUS | Status: AC | PRN
  Administered 2023-05-19: 100 mL via INTRAVENOUS

## 2023-05-19 NOTE — Progress Notes (Signed)
 Hospitalist Transfer Note:    Nursing staff, Please call [facility] Admits & Consults System-Wide number on Amion [phone]) as soon as patient's arrival, so appropriate admitting provider can evaluate the pt.   Transferring facility: [facility] Requesting provider: Dr. Criss Alvine (EDP at [facility]) Reason for transfer: admission for further evaluation and management of anasarca.     58 year old male , who presented to [facility] ED complaining of 1 week of progressive abdominal distention, associated with some shortness of breath, worsening edema in the bilateral lower extremities.  Not associate with any chest pain.  He notes that he experienced, now resolved, rhinitis, rhinorrhea, cough proximately month ago, noting complete subsequent resolution of the symptoms.  The patient denies any knowledge of prior diagnosis of cirrhosis, although CT scan from 2022, included a repeat that conveyed suspicion for underlying cirrhosis.  Vital signs in the ED were notable for the following: Afebrile; heart rates in the 90s to low 100s; systolic blood pressures in the 130s to 150s; respiratory rate 18-24, and oxygen saturation 98 to 100% on room air.   Imaging notable for CT abdomen/pelvis which showed ascites, while CTA chest showed no evidence of acute pulmonary embolism or any evidence of additional acute cardiopulmonary process.  There is suspicion for anasarca in the setting of cirrhosis.  No prior echocardiogram on file.   Subsequently, I accepted this patient for transfer for inpatient admission to a med/tele bed at [facility] or [facility]  (first available) for further work-up and management of the above.       Newton Pigg, DO Hospitalist

## 2023-05-19 NOTE — ED Notes (Signed)
 ED Provider at bedside. EDP completed assessment and performed rectal digital exam to check for bleeding. Dr. Criss Alvine confirmed several small hemorrhoids.

## 2023-05-19 NOTE — ED Triage Notes (Signed)
 Pt reports ShoB, weakness, body aches since this weekend; RT called to bedside, reports clear lung sounds bilaterally   pt also has a visible generalized rash on torso, face and head, first appeared in Feb, was treated here for same previously  Pt also reports bloating in ABD and mid ABD pain, LBM /, noticed bright red blood in stool since Jan, hx of hemorrhoids

## 2023-05-19 NOTE — ED Provider Notes (Signed)
 [facility] Provider Note   CSN: [id number] Arrival date & time: 05/19/23  1929     History  Chief Complaint  Patient presents with   Shortness of Breath   Rash   Rectal Bleeding    James Downs is a 58 y.o. male.  HPI 58 year old male presents with multiple complaints.  For the past week he has been having chest tightness, shortness of breath, leg swelling and abdominal distention and pain.  He states both legs have been swollen but the left has been worse than the right.  Feels like the swelling goes up to both knees.  He is also having constant chest tightness that does worsen with any type of movement or exertion.  The shortness of breath is also constant but worse with exertion.  No significant cough or fevers.  He is had occasional chills.  He had a rash starting last month that he was seen here for this started on his face and went to his torso that has never gone away.  It is not getting worse.  His abdomen is diffusely bloated and feels uncomfortable and painful.  He has intermittent constipation and for the past month and a half has been having blood in the toilet whenever he has a movement.  He has a history of hemorrhoids but does not feel like he has any right now.  No rectal pain.  Home Medications Prior to Admission medications   Medication Sig Start Date End Date Taking? Authorizing Provider  acetaminophen (TYLENOL) 500 MG tablet Take 1,000 mg by mouth every 6 (six) hours as needed.    [provider]  albuterol (VENTOLIN HFA) 108 (90 Base) MCG/ACT inhaler Inhale 1-2 puffs into the lungs every 6 (six) hours as needed for wheezing or shortness of breath. 02/22/20   Long, Arlyss Repress, MD  allopurinol (ZYLOPRIM) 100 MG tablet Take 100 mg by mouth daily. 05/04/19   [provider]  amLODipine (NORVASC) 2.5 MG tablet Take 1 tablet (2.5 mg total) by mouth daily. 10/19/22   Tilden Fossa, MD  benzonatate  (TESSALON) 100 MG capsule Take 1 capsule (100 mg total) by mouth 3 (three) times daily as needed for cough. 02/22/20   Long, Arlyss Repress, MD  doxycycline (VIBRAMYCIN) 100 MG capsule Take 1 capsule (100 mg total) by mouth 2 (two) times daily. 10/19/22   Tilden Fossa, MD  ondansetron (ZOFRAN ODT) 4 MG disintegrating tablet Take 1 tablet (4 mg total) by mouth every 8 (eight) hours as needed for nausea or vomiting. 08/09/19   Bethel Born, PA-C  ondansetron (ZOFRAN ODT) 4 MG disintegrating tablet Take 1 tablet (4 mg total) by mouth every 8 (eight) hours as needed for nausea or vomiting. 04/10/20   Gerhard Munch, MD  PARoxetine (PAXIL) 40 MG tablet Take 40 mg by mouth daily. 03/15/18   [provider]  predniSONE (DELTASONE) 20 MG tablet Take 2 tablets (40 mg total) by mouth daily. 04/27/23   Renne Crigler, PA-C  telmisartan (MICARDIS) 20 MG tablet Take 1 tablet (20 mg total) by mouth daily. 03/25/21   Harris, Cammy Copa, PA-C  tiZANidine (ZANAFLEX) 4 MG tablet Take 1 tablet (4 mg total) by mouth every 6 (six) hours as needed for muscle spasms. 09/16/20   Rosezella Rumpf, PA-C      Allergies    Other    Review of Systems   Review of Systems  Constitutional:  Negative for fever.  Respiratory:  Positive  for chest tightness and shortness of breath. Negative for cough.   Cardiovascular:  Positive for leg swelling.  Gastrointestinal:  Positive for abdominal distention, abdominal pain, blood in stool and constipation. Negative for diarrhea and vomiting.    Physical Exam Updated Vital Signs BP (!) 154/91   Pulse 99   Temp 98 F (36.7 C) (Oral)   Resp (!) 22   Ht 5\' 6"  (1.676 m)   Wt 104.3 kg   SpO2 98%   BMI 37.12 kg/m  Physical Exam Vitals and nursing note reviewed. Exam conducted with a chaperone present.  Constitutional:      General: He is not in acute distress.    Appearance: He is well-developed. He is not ill-appearing or diaphoretic.  HENT:     Head: Normocephalic and  atraumatic.     Mouth/Throat:     Comments: No intra-oral lesions Cardiovascular:     Rate and Rhythm: Regular rhythm. Tachycardia present.     Heart sounds: Normal heart sounds.     Comments: HR low 100s Pulmonary:     Effort: Pulmonary effort is normal. Tachypnea present. No accessory muscle usage.     Breath sounds: Decreased breath sounds (mild, bases) present.  Abdominal:     Palpations: Abdomen is soft.     Tenderness: There is generalized abdominal tenderness (mild).  Genitourinary:    Comments: Non-thrombosed hemorrhoids present. No gross blood on digital rectal exam. Musculoskeletal:     Right lower leg: Edema present.     Left lower leg: Edema present.     Comments: Pitting edema to BLE. Feet, ankles, and lower legs.  Skin:    General: Skin is warm and dry.     Findings: Rash (fine erythematous rash, mostly on forehead and chest/back.) present.  Neurological:     Mental Status: He is alert.     ED Results / Procedures / Treatments   Labs (all labs ordered are listed, but only abnormal results are displayed) Labs Reviewed  COMPREHENSIVE METABOLIC PANEL - Abnormal; Notable for the following components:      Result Value   CO2 20 (*)    Creatinine, Ser 1.44 (*)    Calcium 8.2 (*)    Albumin 2.6 (*)    AST 109 (*)    ALT 77 (*)    Total Bilirubin 3.9 (*)    GFR, Estimated 56 (*)    All other components within normal limits  CBC - Abnormal; Notable for the following components:   RBC 3.74 (*)    HCT 38.3 (*)    MCV 102.4 (*)    MCH 34.8 (*)    RDW 16.8 (*)    Platelets 97 (*)    All other components within normal limits  URINALYSIS, ROUTINE W REFLEX MICROSCOPIC - Abnormal; Notable for the following components:   Hgb urine dipstick MODERATE (*)    Bilirubin Urine SMALL (*)    Leukocytes,Ua TRACE (*)    All other components within normal limits  D-DIMER, QUANTITATIVE - Abnormal; Notable for the following components:   D-Dimer, Quant 3.08 (*)    All other  components within normal limits  URINALYSIS, MICROSCOPIC (REFLEX) - Abnormal; Notable for the following components:   Bacteria, UA RARE (*)    All other components within normal limits  PROTIME-INR - Abnormal; Notable for the following components:   Prothrombin Time 19.8 (*)    INR 1.7 (*)    All other components within normal limits  RESP PANEL BY RT-PCR (  RSV, FLU A&B, COVID)  RVPGX2  DIFFERENTIAL  BRAIN NATRIURETIC PEPTIDE  LIPASE, BLOOD  TROPONIN I (HIGH SENSITIVITY)  TROPONIN I (HIGH SENSITIVITY)    EKG EKG Interpretation Date/Time:  Tuesday May 19 2023 19:38:21 EDT Ventricular Rate:  108 PR Interval:  135 QRS Duration:  88 QT Interval:  342 QTC Calculation: 459 R Axis:   52  Text Interpretation: Sinus tachycardia Abnormal R-wave progression, early transition HR is faster, otherwise no significant change since July 2022 Confirmed by Pricilla Loveless [phone]) on 05/19/2023 7:41:16 PM  Radiology US Venous Img Lower Bilateral Result Date: 05/19/2023 CLINICAL DATA:  Bilateral lower extremity swelling. EXAM: BILATERAL LOWER EXTREMITY VENOUS DOPPLER ULTRASOUND TECHNIQUE: Gray-scale sonography with graded compression, as well as color Doppler and duplex ultrasound were performed to evaluate the lower extremity deep venous systems from the level of the common femoral vein and including the common femoral, femoral, profunda femoral, popliteal and calf veins including the posterior tibial, peroneal and gastrocnemius veins when visible. The superficial great saphenous vein was also interrogated. Spectral Doppler was utilized to evaluate flow at rest and with distal augmentation maneuvers in the common femoral, femoral and popliteal veins. COMPARISON:  December 10, 2018 FINDINGS: RIGHT LOWER EXTREMITY Common Femoral Vein: No evidence of thrombus. Normal compressibility, respiratory phasicity and response to augmentation. Saphenofemoral Junction: No evidence of thrombus. Normal compressibility and  flow on color Doppler imaging. Profunda Femoral Vein: No evidence of thrombus. Normal compressibility and flow on color Doppler imaging. Femoral Vein: No evidence of thrombus. Normal compressibility, respiratory phasicity and response to augmentation. Popliteal Vein: No evidence of thrombus. Normal compressibility, respiratory phasicity and response to augmentation. Calf Veins: The RIGHT posterior tibial vein and RIGHT peroneal vein are limited in visualization secondary to calf edema. Superficial Great Saphenous Vein: No evidence of thrombus. Normal compressibility. Venous Reflux:  None. Other Findings:  None. LEFT LOWER EXTREMITY Common Femoral Vein: No evidence of thrombus. Normal compressibility, respiratory phasicity and response to augmentation. Saphenofemoral Junction: No evidence of thrombus. Normal compressibility and flow on color Doppler imaging. Profunda Femoral Vein: No evidence of thrombus. Normal compressibility and flow on color Doppler imaging. Femoral Vein: No evidence of thrombus. Normal compressibility, respiratory phasicity and response to augmentation. Popliteal Vein: No evidence of thrombus. Normal compressibility, respiratory phasicity and response to augmentation. Calf Veins: The LEFT posterior tibial vein and LEFT peroneal vein are limited in visualization secondary to calf edema. Superficial Great Saphenous Vein: No evidence of thrombus. Normal compressibility. Venous Reflux:  None. Other Findings:  None. IMPRESSION: Limited visualization of the BILATERAL posterior tibial veins and BILATERAL peroneal veins, without evidence of deep venous thrombosis in either lower extremity. Electronically Signed   By: Aram Candela M.D.   On: 05/19/2023 21:42   CT ABDOMEN PELVIS W CONTRAST Result Date: 05/19/2023 CLINICAL DATA:  Positive D-dimer with shortness of breath. Mid abdominal pain and bloating. EXAM: CT ANGIOGRAPHY CHEST CT ABDOMEN AND PELVIS WITH CONTRAST TECHNIQUE: Multidetector CT  imaging of the chest was performed using the standard protocol during bolus administration of intravenous contrast. Multiplanar CT image reconstructions and MIPs were obtained to evaluate the vascular anatomy. Multidetector CT imaging of the abdomen and pelvis was performed using the standard protocol during bolus administration of intravenous contrast. RADIATION DOSE REDUCTION: This exam was performed according to the departmental dose-optimization program which includes automated exposure control, adjustment of the mA and/or kV according to patient size and/or use of iterative reconstruction technique. CONTRAST:  OMNIPAQUE IOHEXOL 350 MG/ML SOLN COMPARISON:  CT  abdomen and pelvis 04/16/2020.  PET-CT 08/30/2019. FINDINGS: CTA CHEST FINDINGS Cardiovascular: Satisfactory opacification of the pulmonary arteries to the segmental level. No evidence of pulmonary embolism. Normal heart size. No pericardial effusion. There are atherosclerotic calcifications of the aorta and coronary arteries. Mediastinum/Nodes: There is an enlarged subcarinal lymph node measuring 12 mm similar to prior. No new enlarged lymph nodes are seen. Visualized esophagus and thyroid gland are within normal limits. Lungs/Pleura: There is a calcified granuloma in the right upper lobe. The lungs are otherwise clear. There is no pleural effusion or pneumothorax. Musculoskeletal: No acute fracture or focal osseous lesion. Left hemilaminectomy defects are seen at multiple lower thoracic levels, unchanged. Review of the MIP images confirms the above findings. CT ABDOMEN and PELVIS FINDINGS Hepatobiliary: The liver is diffusely heterogeneous with nodular liver contour compatible with cirrhosis. Gallstones are present. There is no biliary ductal dilatation. Pancreas: Unremarkable. No pancreatic ductal dilatation or surrounding inflammatory changes. Spleen: The spleen is mildly enlarged, unchanged. Adrenals/Urinary Tract: .Multiple punctate bilateral  renal calculi are again noted. There is no hydronephrosis or perinephric fluid. There is a 9 mm calculus in the left renal pelvis. The adrenal glands and bladder are within normal limits. Stomach/Bowel: There is wall thickening of the ascending colon and hepatic flexure. No dilated bowel loops are seen. The appendix is within normal limits. The stomach is decompressed. Vascular/Lymphatic: Aorta and IVC are normal in size. There are atherosclerotic calcifications of the aorta. Portal venous system grossly patent. There are numerous nonenlarged and prominent retroperitoneal lymph nodes. There is an enlarged portacaval lymph node measuring 2 cm short axis, unchanged. Reproductive: Prostate is unremarkable. Other: There is a moderate to large amount of ascites throughout the abdomen and pelvis. There is no free intraperitoneal air. There is body wall edema diffusely. Musculoskeletal: No acute or significant osseous findings. Review of the MIP images confirms the above findings. IMPRESSION: 1. No evidence for pulmonary embolism. 2. Stable enlarged subcarinal lymph node. 3. Stable cirrhotic liver with splenomegaly and moderate to large amount of ascites. 4. Wall thickening of the ascending colon and hepatic flexure worrisome for colitis. 5. Cholelithiasis. 6. Nonobstructing bilateral renal calculi. 7. Body wall edema. 8. Aortic atherosclerosis. Aortic Atherosclerosis (ICD10-I70.0). Electronically Signed   By: Darliss Cheney M.D.   On: 05/19/2023 21:22   CT Angio Chest PE W and/or Wo Contrast Result Date: 05/19/2023 CLINICAL DATA:  Positive D-dimer with shortness of breath. Mid abdominal pain and bloating. EXAM: CT ANGIOGRAPHY CHEST CT ABDOMEN AND PELVIS WITH CONTRAST TECHNIQUE: Multidetector CT imaging of the chest was performed using the standard protocol during bolus administration of intravenous contrast. Multiplanar CT image reconstructions and MIPs were obtained to evaluate the vascular anatomy. Multidetector CT  imaging of the abdomen and pelvis was performed using the standard protocol during bolus administration of intravenous contrast. RADIATION DOSE REDUCTION: This exam was performed according to the departmental dose-optimization program which includes automated exposure control, adjustment of the mA and/or kV according to patient size and/or use of iterative reconstruction technique. CONTRAST:  OMNIPAQUE IOHEXOL 350 MG/ML SOLN COMPARISON:  CT abdomen and pelvis 04/16/2020.  PET-CT 08/30/2019. FINDINGS: CTA CHEST FINDINGS Cardiovascular: Satisfactory opacification of the pulmonary arteries to the segmental level. No evidence of pulmonary embolism. Normal heart size. No pericardial effusion. There are atherosclerotic calcifications of the aorta and coronary arteries. Mediastinum/Nodes: There is an enlarged subcarinal lymph node measuring 12 mm similar to prior. No new enlarged lymph nodes are seen. Visualized esophagus and thyroid gland are within normal  limits. Lungs/Pleura: There is a calcified granuloma in the right upper lobe. The lungs are otherwise clear. There is no pleural effusion or pneumothorax. Musculoskeletal: No acute fracture or focal osseous lesion. Left hemilaminectomy defects are seen at multiple lower thoracic levels, unchanged. Review of the MIP images confirms the above findings. CT ABDOMEN and PELVIS FINDINGS Hepatobiliary: The liver is diffusely heterogeneous with nodular liver contour compatible with cirrhosis. Gallstones are present. There is no biliary ductal dilatation. Pancreas: Unremarkable. No pancreatic ductal dilatation or surrounding inflammatory changes. Spleen: The spleen is mildly enlarged, unchanged. Adrenals/Urinary Tract: .Multiple punctate bilateral renal calculi are again noted. There is no hydronephrosis or perinephric fluid. There is a 9 mm calculus in the left renal pelvis. The adrenal glands and bladder are within normal limits. Stomach/Bowel: There is wall thickening  of the ascending colon and hepatic flexure. No dilated bowel loops are seen. The appendix is within normal limits. The stomach is decompressed. Vascular/Lymphatic: Aorta and IVC are normal in size. There are atherosclerotic calcifications of the aorta. Portal venous system grossly patent. There are numerous nonenlarged and prominent retroperitoneal lymph nodes. There is an enlarged portacaval lymph node measuring 2 cm short axis, unchanged. Reproductive: Prostate is unremarkable. Other: There is a moderate to large amount of ascites throughout the abdomen and pelvis. There is no free intraperitoneal air. There is body wall edema diffusely. Musculoskeletal: No acute or significant osseous findings. Review of the MIP images confirms the above findings. IMPRESSION: 1. No evidence for pulmonary embolism. 2. Stable enlarged subcarinal lymph node. 3. Stable cirrhotic liver with splenomegaly and moderate to large amount of ascites. 4. Wall thickening of the ascending colon and hepatic flexure worrisome for colitis. 5. Cholelithiasis. 6. Nonobstructing bilateral renal calculi. 7. Body wall edema. 8. Aortic atherosclerosis. Aortic Atherosclerosis (ICD10-I70.0). Electronically Signed   By: Darliss Cheney M.D.   On: 05/19/2023 21:22   DG Chest 2 View Result Date: 05/19/2023 CLINICAL DATA:  Shortness of breath.  Rash.  Rectal bleeding. EXAM: CHEST - 2 VIEW COMPARISON:  10/19/2022 FINDINGS: Shallow inspiration. Heart size and pulmonary vascularity are normal. Mild atelectasis in the lung bases. No airspace disease or consolidation. No pleural effusion or pneumothorax. Mediastinal contours appear intact. Calcification of the aorta. Degenerative changes in the spine. IMPRESSION: Shallow inspiration with atelectasis in the lung bases. Electronically Signed   By: Burman Nieves M.D.   On: 05/19/2023 20:13    Procedures Procedures    Medications Ordered in ED Medications  iohexol (OMNIPAQUE) 350 MG/ML injection 100 mL  (100 mLs Intravenous Contrast Given 05/19/23 2045)  furosemide (LASIX) injection 20 mg (20 mg Intravenous Given 05/19/23 2154)    ED Course/ Medical Decision Making/ A&P                                 Medical Decision Making Amount and/or Complexity of Data Reviewed Labs: ordered.    Details: Elevated Bilirubin Radiology: ordered and independent interpretation performed.    Details: No PE. Ascites ECG/medicine tests: ordered and independent interpretation performed.    Details: Sinus tachycardia  Risk Prescription drug management. Decision regarding hospitalization.   Patient presents with anasarca and ascites. He is tachypneic, but not in distress. No evidence of MI. No PE on CTA. CT does show ascites, as well as cirrhosis. He is not aware of cirrhosis history, thought it seems like cirrhosis was radiologically present in 2022 on a renal stone study. No significant alcohol  use history. No significant tylenol use. Will give lasix and he will need admission. Discussed with Dr. Arlean Hopping for admission.         Final Clinical Impression(s) / ED Diagnoses Final diagnoses:  Other ascites  Anasarca    Rx / DC Orders ED Discharge Orders     None         Pricilla Loveless, MD 05/19/23 2250

## 2023-05-19 NOTE — ED Notes (Signed)
 Patient transported to X-ray

## 2023-05-19 NOTE — ED Notes (Signed)
 Patient transported to CT

## 2023-05-20 ENCOUNTER — Inpatient Hospital Stay (HOSPITAL_COMMUNITY)

## 2023-05-20 DIAGNOSIS — F17210 Nicotine dependence, cigarettes, uncomplicated: Secondary | ICD-10-CM | POA: Diagnosis not present

## 2023-05-20 DIAGNOSIS — R601 Generalized edema: Secondary | ICD-10-CM

## 2023-05-20 DIAGNOSIS — Z79899 Other long term (current) drug therapy: Secondary | ICD-10-CM | POA: Diagnosis not present

## 2023-05-20 DIAGNOSIS — Z8249 Family history of ischemic heart disease and other diseases of the circulatory system: Secondary | ICD-10-CM | POA: Diagnosis not present

## 2023-05-20 DIAGNOSIS — R791 Abnormal coagulation profile: Secondary | ICD-10-CM | POA: Diagnosis not present

## 2023-05-20 DIAGNOSIS — N200 Calculus of kidney: Secondary | ICD-10-CM | POA: Diagnosis not present

## 2023-05-20 DIAGNOSIS — E7800 Pure hypercholesterolemia, unspecified: Secondary | ICD-10-CM | POA: Diagnosis not present

## 2023-05-20 DIAGNOSIS — K7460 Unspecified cirrhosis of liver: Secondary | ICD-10-CM | POA: Diagnosis not present

## 2023-05-20 DIAGNOSIS — D539 Nutritional anemia, unspecified: Secondary | ICD-10-CM | POA: Diagnosis not present

## 2023-05-20 DIAGNOSIS — K8020 Calculus of gallbladder without cholecystitis without obstruction: Secondary | ICD-10-CM | POA: Diagnosis not present

## 2023-05-20 DIAGNOSIS — R0602 Shortness of breath: Secondary | ICD-10-CM | POA: Diagnosis not present

## 2023-05-20 DIAGNOSIS — Z833 Family history of diabetes mellitus: Secondary | ICD-10-CM | POA: Diagnosis not present

## 2023-05-20 DIAGNOSIS — E8770 Fluid overload, unspecified: Secondary | ICD-10-CM | POA: Diagnosis not present

## 2023-05-20 DIAGNOSIS — Z6837 Body mass index (BMI) 37.0-37.9, adult: Secondary | ICD-10-CM | POA: Diagnosis not present

## 2023-05-20 DIAGNOSIS — K766 Portal hypertension: Secondary | ICD-10-CM | POA: Diagnosis not present

## 2023-05-20 DIAGNOSIS — M7989 Other specified soft tissue disorders: Secondary | ICD-10-CM | POA: Diagnosis not present

## 2023-05-20 DIAGNOSIS — K7290 Hepatic failure, unspecified without coma: Secondary | ICD-10-CM | POA: Diagnosis not present

## 2023-05-20 DIAGNOSIS — G4733 Obstructive sleep apnea (adult) (pediatric): Secondary | ICD-10-CM | POA: Diagnosis not present

## 2023-05-20 DIAGNOSIS — R590 Localized enlarged lymph nodes: Secondary | ICD-10-CM | POA: Diagnosis not present

## 2023-05-20 DIAGNOSIS — J301 Allergic rhinitis due to pollen: Secondary | ICD-10-CM | POA: Diagnosis not present

## 2023-05-20 DIAGNOSIS — R188 Other ascites: Secondary | ICD-10-CM | POA: Diagnosis not present

## 2023-05-20 DIAGNOSIS — I5021 Acute systolic (congestive) heart failure: Secondary | ICD-10-CM | POA: Diagnosis not present

## 2023-05-20 DIAGNOSIS — R600 Localized edema: Secondary | ICD-10-CM | POA: Diagnosis not present

## 2023-05-20 DIAGNOSIS — K625 Hemorrhage of anus and rectum: Secondary | ICD-10-CM | POA: Diagnosis not present

## 2023-05-20 DIAGNOSIS — R21 Rash and other nonspecific skin eruption: Secondary | ICD-10-CM | POA: Diagnosis not present

## 2023-05-20 DIAGNOSIS — I10 Essential (primary) hypertension: Secondary | ICD-10-CM | POA: Diagnosis not present

## 2023-05-20 DIAGNOSIS — Z1152 Encounter for screening for COVID-19: Secondary | ICD-10-CM | POA: Diagnosis not present

## 2023-05-20 DIAGNOSIS — M109 Gout, unspecified: Secondary | ICD-10-CM | POA: Diagnosis not present

## 2023-05-20 DIAGNOSIS — R161 Splenomegaly, not elsewhere classified: Secondary | ICD-10-CM | POA: Diagnosis not present

## 2023-05-20 DIAGNOSIS — J9811 Atelectasis: Secondary | ICD-10-CM | POA: Diagnosis not present

## 2023-05-20 DIAGNOSIS — D6959 Other secondary thrombocytopenia: Secondary | ICD-10-CM | POA: Diagnosis not present

## 2023-05-20 DIAGNOSIS — K7689 Other specified diseases of liver: Secondary | ICD-10-CM | POA: Diagnosis not present

## 2023-05-20 DIAGNOSIS — F32A Depression, unspecified: Secondary | ICD-10-CM | POA: Diagnosis not present

## 2023-05-20 DIAGNOSIS — I8500 Esophageal varices without bleeding: Secondary | ICD-10-CM | POA: Diagnosis not present

## 2023-05-20 LAB — HEPATITIS PANEL, ACUTE
HCV Ab: NONREACTIVE
Hep A IgM: NONREACTIVE
Hep B C IgM: NONREACTIVE
Hepatitis B Surface Ag: NONREACTIVE

## 2023-05-20 LAB — TRANSFERRIN: Transferrin: 130 mg/dL — ABNORMAL LOW (ref 180–329)

## 2023-05-20 LAB — AMMONIA: Ammonia: 71 umol/L — ABNORMAL HIGH (ref 9–35)

## 2023-05-20 LAB — ACETAMINOPHEN LEVEL: Acetaminophen (Tylenol), Serum: <10 ug/mL — ABNORMAL LOW (ref 10–30)

## 2023-05-20 LAB — HIV ANTIBODY (ROUTINE TESTING W REFLEX): HIV Screen 4th Generation wRfx: NONREACTIVE

## 2023-05-20 LAB — FERRITIN: Ferritin: 387 ng/mL — ABNORMAL HIGH (ref 24–336)

## 2023-05-20 MED ORDER — CLOBETASOL PROPIONATE 0.05 % EX OINT
TOPICAL_OINTMENT | Freq: Two times a day (BID) | CUTANEOUS | Status: DC
  Filled 2023-05-20: qty 15

## 2023-05-20 MED ORDER — SIMETHICONE 80 MG PO CHEW: 160.0000 mg | CHEWABLE_TABLET | Freq: Every day | ORAL | Status: DC | PRN

## 2023-05-20 MED ORDER — ALBUTEROL SULFATE (2.5 MG/3ML) 0.083% IN NEBU: 2.5000 mg | INHALATION_SOLUTION | RESPIRATORY_TRACT | Status: DC | PRN

## 2023-05-20 MED ORDER — SIMETHICONE 500 MG PO CAPS: 500.0000 mg | ORAL_CAPSULE | Freq: Every day | ORAL | Status: DC | PRN

## 2023-05-20 MED ORDER — FUROSEMIDE 10 MG/ML IJ SOLN
40.0000 mg | Freq: Every day | INTRAMUSCULAR | Status: DC
  Administered 2023-05-20 – 2023-05-22 (×3): 40 mg via INTRAVENOUS
  Filled 2023-05-20 (×3): qty 4

## 2023-05-20 MED ORDER — IBUPROFEN 400 MG PO TABS: 400.0000 mg | ORAL_TABLET | Freq: Four times a day (QID) | ORAL | Status: DC | PRN

## 2023-05-20 MED ORDER — SIMETHICONE 500 MG PO CAPS: 500.0000 mg | ORAL_CAPSULE | Freq: Every day | ORAL | Status: DC

## 2023-05-20 MED ORDER — ONDANSETRON HCL 4 MG/2ML IJ SOLN: 4.0000 mg | Freq: Four times a day (QID) | INTRAMUSCULAR | Status: DC | PRN

## 2023-05-20 MED ORDER — ONDANSETRON HCL 4 MG PO TABS: 4.0000 mg | ORAL_TABLET | Freq: Four times a day (QID) | ORAL | Status: DC | PRN

## 2023-05-20 MED ORDER — SPIRONOLACTONE 25 MG PO TABS
100.0000 mg | ORAL_TABLET | Freq: Every day | ORAL | Status: DC
  Administered 2023-05-20 – 2023-05-22 (×3): 100 mg via ORAL
  Filled 2023-05-20 (×3): qty 4

## 2023-05-20 MED ORDER — TRAZODONE HCL 50 MG PO TABS
25.0000 mg | ORAL_TABLET | Freq: Every evening | ORAL | Status: DC | PRN
  Administered 2023-05-21: 25 mg via ORAL
  Filled 2023-05-20: qty 1

## 2023-05-20 NOTE — ED Notes (Signed)
 Son Joffre Lucks - Contact# [phone]

## 2023-05-20 NOTE — ED Notes (Signed)
 Pt given orange juice and oatmeal per RN.

## 2023-05-20 NOTE — H&P (Addendum)
 History and Physical  James Downs ZOX:[id number] DOB: July 31, 1965 DOA: 05/19/2023  PCP: Patient, No Pcp Per   Chief Complaint: Rash, leg swelling  HPI: James Downs is a 58 y.o. male with medical history significant for hypertension, hypercholesterolemia, cirrhosis being admitted to the hospital with decompensated liver cirrhosis.  He has actually seen Dr. Audley Hose in the past, but this was done for screening colonoscopy as well as hemorrhoids, was not aware that imaging back in 2022 showed evidence of liver cirrhosis.  In any case, he has been in his usual state of health until about 6 weeks ago, when he developed non-itchy painless rash on his scalp, face and trunk.  He was seen at [facility] at the time, took a short course of steroids which initially seemed to help the rash but then it came back.  It has been stable since that time.  Starting about a month ago, he started noticing lower extremity edema.  Then about a week or 2 ago, he also started noticing some abdominal fullness and thought that maybe he was constipated.  Yesterday he came to the ER for evaluation due to this continued worsening swelling.  He denies any abdominal pain, weight loss, fevers, chills, nausea, vomiting.  He estimates that he drinks about 4 beers per year, only on special occasions.  He used to take Tylenol pretty regularly, but even in that case states that he would take about 3 extra strength Tylenol's once or twice per week.  No personal or family history of cancer, or GI problems.  Imaging below shows evidence of abdominal ascites, liver cirrhosis.  He was given a dose of IV Lasix in the emergency department prior to transfer to [facility].  Review of Systems: Please see HPI for pertinent positives and negatives. A complete 10 system review of systems are otherwise negative.  Past Medical History:  Diagnosis Date   Arthritis    Depression    Gout    Hypercholesterolemia    Hypertension    on meds    Kidney damage    Kidney stone    Obesity    Sleep apnea    uses CPAP   Past Surgical History:  Procedure Laterality Date   FOOT SURGERY Right 1991   HAND SURGERY Right 1985   POSTERIOR LUMBAR FUSION 4 LEVEL N/A 11/29/2013   Procedure: Thoracic Eight, Thoracic Nine, Costotransversectomy   Thoracic Seven-Eight Thoracic Eight-Nine diskectomy;  Surgeon: Lisbeth Renshaw, MD;  Location: [facility];  Service: Neurosurgery;  Laterality: N/A;  Thoracic Eight, Thoracic Nine, Costotransversectomy   Thoracic Seven-Eight Thoracic Eight-Nine diskectomy   Social History:  reports that he has been smoking cigarettes. He has a 30 pack-year smoking history. He has quit using smokeless tobacco. He reports current alcohol use. He reports that he does not use drugs.  Allergies  Allergen Reactions   Other Other (See Comments)    Hay fever     Family History  Problem Relation Age of Onset   Cancer Father        BRAIN/PROSTATE/SKIN   Hypertension Father    Cancer Paternal Grandfather    Heart failure Maternal Grandmother    Diabetes Maternal Grandmother    Hypertension Maternal Grandmother    Hypertension Maternal Grandfather      Prior to Admission medications   Medication Sig Start Date End Date Taking? Authorizing Provider  acetaminophen (TYLENOL) 500 MG tablet Take 1,500 mg by mouth at bedtime as needed for moderate pain (pain score  4-6).   Yes [provider]  Simethicone 500 MG CAPS Take 500 mg by mouth at bedtime.   Yes [provider]  albuterol (VENTOLIN HFA) 108 (90 Base) MCG/ACT inhaler Inhale 1-2 puffs into the lungs every 6 (six) hours as needed for wheezing or shortness of breath. Patient not taking: Reported on 05/20/2023 02/22/20   Maia Plan, MD  allopurinol (ZYLOPRIM) 100 MG tablet Take 100 mg by mouth daily. Patient not taking: Reported on 05/20/2023 05/04/19   [provider]  amLODipine (NORVASC) 2.5 MG tablet Take 1 tablet (2.5 mg total) by  mouth daily. Patient not taking: Reported on 05/20/2023 10/19/22   Tilden Fossa, MD  doxycycline (VIBRAMYCIN) 100 MG capsule Take 1 capsule (100 mg total) by mouth 2 (two) times daily. Patient not taking: Reported on 05/20/2023 10/19/22   Tilden Fossa, MD  PARoxetine (PAXIL) 40 MG tablet Take 40 mg by mouth daily. Patient not taking: Reported on 05/20/2023 03/15/18   [provider]  predniSONE (DELTASONE) 20 MG tablet Take 2 tablets (40 mg total) by mouth daily. Patient not taking: Reported on 05/20/2023 04/27/23   Renne Crigler, PA-C  telmisartan (MICARDIS) 20 MG tablet Take 1 tablet (20 mg total) by mouth daily. Patient not taking: Reported on 05/20/2023 03/25/21   Arthor Captain, PA-C  tiZANidine (ZANAFLEX) 4 MG tablet Take 1 tablet (4 mg total) by mouth every 6 (six) hours as needed for muscle spasms. Patient not taking: Reported on 05/20/2023 09/16/20   Rosezella Rumpf, PA-C    Physical Exam: BP 113/81 (BP Location: Left Arm)   Pulse 94   Temp 98.6 F (37 C) (Oral)   Resp 16   Ht 5\' 7"  (1.702 m)   Wt 108.7 kg   SpO2 99%   BMI 37.53 kg/m  General:  Alert, oriented, calm, in no acute distress  Eyes: EOMI, clear conjuctivae, white sclerea Neck: supple, no masses, trachea mildline  Cardiovascular: RRR, no murmurs or rubs, he has 2+ pitting bilateral lower extremity edema  Respiratory: clear to auscultation bilaterally, no wheezes, no crackles  Abdomen: soft, nontender, distended without obvious fluid wave, normal bowel tones heard  Skin: dry, erythematous papular rash on the scalp, face, trunk as pictured below Musculoskeletal: no joint effusions, normal range of motion  Psychiatric: appropriate affect, normal speech  Neurologic: extraocular muscles intact, clear speech, moving all extremities with intact sensorium              Labs on Admission:  Basic Metabolic Panel: Recent Labs  Lab 05/19/23 1952  NA 137  K 3.7  CL 107  CO2 20*  GLUCOSE 98  BUN 13   CREATININE 1.44*  CALCIUM 8.2*   Liver Function Tests: Recent Labs  Lab 05/19/23 1952  AST 109*  ALT 77*  ALKPHOS 98  BILITOT 3.9*  PROT 7.3  ALBUMIN 2.6*   Recent Labs  Lab 05/19/23 1952  LIPASE 43   No results for input(s): "AMMONIA" in the last 168 hours. CBC: Recent Labs  Lab 05/19/23 1952  WBC 5.9  NEUTROABS 4.1  HGB 13.0  HCT 38.3*  MCV 102.4*  PLT 97*   Cardiac Enzymes: No results for input(s): "CKTOTAL", "CKMB", "CKMBINDEX", "TROPONINI" in the last 168 hours. BNP (last 3 results) Recent Labs    05/19/23 1952  BNP 56.5    ProBNP (last 3 results) No results for input(s): "PROBNP" in the last 8760 hours.  CBG: No results for input(s): "GLUCAP" in the last 168 hours.  Radiological Exams on Admission: US Venous Img Lower Bilateral Result Date: 05/19/2023 CLINICAL DATA:  Bilateral lower extremity swelling. EXAM: BILATERAL LOWER EXTREMITY VENOUS DOPPLER ULTRASOUND TECHNIQUE: Gray-scale sonography with graded compression, as well as color Doppler and duplex ultrasound were performed to evaluate the lower extremity deep venous systems from the level of the common femoral vein and including the common femoral, femoral, profunda femoral, popliteal and calf veins including the posterior tibial, peroneal and gastrocnemius veins when visible. The superficial great saphenous vein was also interrogated. Spectral Doppler was utilized to evaluate flow at rest and with distal augmentation maneuvers in the common femoral, femoral and popliteal veins. COMPARISON:  December 10, 2018 FINDINGS: RIGHT LOWER EXTREMITY Common Femoral Vein: No evidence of thrombus. Normal compressibility, respiratory phasicity and response to augmentation. Saphenofemoral Junction: No evidence of thrombus. Normal compressibility and flow on color Doppler imaging. Profunda Femoral Vein: No evidence of thrombus. Normal compressibility and flow on color Doppler imaging. Femoral Vein: No evidence of thrombus.  Normal compressibility, respiratory phasicity and response to augmentation. Popliteal Vein: No evidence of thrombus. Normal compressibility, respiratory phasicity and response to augmentation. Calf Veins: The RIGHT posterior tibial vein and RIGHT peroneal vein are limited in visualization secondary to calf edema. Superficial Great Saphenous Vein: No evidence of thrombus. Normal compressibility. Venous Reflux:  None. Other Findings:  None. LEFT LOWER EXTREMITY Common Femoral Vein: No evidence of thrombus. Normal compressibility, respiratory phasicity and response to augmentation. Saphenofemoral Junction: No evidence of thrombus. Normal compressibility and flow on color Doppler imaging. Profunda Femoral Vein: No evidence of thrombus. Normal compressibility and flow on color Doppler imaging. Femoral Vein: No evidence of thrombus. Normal compressibility, respiratory phasicity and response to augmentation. Popliteal Vein: No evidence of thrombus. Normal compressibility, respiratory phasicity and response to augmentation. Calf Veins: The LEFT posterior tibial vein and LEFT peroneal vein are limited in visualization secondary to calf edema. Superficial Great Saphenous Vein: No evidence of thrombus. Normal compressibility. Venous Reflux:  None. Other Findings:  None. IMPRESSION: Limited visualization of the BILATERAL posterior tibial veins and BILATERAL peroneal veins, without evidence of deep venous thrombosis in either lower extremity. Electronically Signed   By: Aram Candela M.D.   On: 05/19/2023 21:42   CT ABDOMEN PELVIS W CONTRAST Result Date: 05/19/2023 CLINICAL DATA:  Positive D-dimer with shortness of breath. Mid abdominal pain and bloating. EXAM: CT ANGIOGRAPHY CHEST CT ABDOMEN AND PELVIS WITH CONTRAST TECHNIQUE: Multidetector CT imaging of the chest was performed using the standard protocol during bolus administration of intravenous contrast. Multiplanar CT image reconstructions and MIPs were obtained to  evaluate the vascular anatomy. Multidetector CT imaging of the abdomen and pelvis was performed using the standard protocol during bolus administration of intravenous contrast. RADIATION DOSE REDUCTION: This exam was performed according to the departmental dose-optimization program which includes automated exposure control, adjustment of the mA and/or kV according to patient size and/or use of iterative reconstruction technique. CONTRAST:  OMNIPAQUE IOHEXOL 350 MG/ML SOLN COMPARISON:  CT abdomen and pelvis 04/16/2020.  PET-CT 08/30/2019. FINDINGS: CTA CHEST FINDINGS Cardiovascular: Satisfactory opacification of the pulmonary arteries to the segmental level. No evidence of pulmonary embolism. Normal heart size. No pericardial effusion. There are atherosclerotic calcifications of the aorta and coronary arteries. Mediastinum/Nodes: There is an enlarged subcarinal lymph node measuring 12 mm similar to prior. No new enlarged lymph nodes are seen. Visualized esophagus and thyroid gland are within normal limits. Lungs/Pleura: There is a calcified granuloma in the right upper lobe. The lungs are otherwise clear. There  is no pleural effusion or pneumothorax. Musculoskeletal: No acute fracture or focal osseous lesion. Left hemilaminectomy defects are seen at multiple lower thoracic levels, unchanged. Review of the MIP images confirms the above findings. CT ABDOMEN and PELVIS FINDINGS Hepatobiliary: The liver is diffusely heterogeneous with nodular liver contour compatible with cirrhosis. Gallstones are present. There is no biliary ductal dilatation. Pancreas: Unremarkable. No pancreatic ductal dilatation or surrounding inflammatory changes. Spleen: The spleen is mildly enlarged, unchanged. Adrenals/Urinary Tract: .Multiple punctate bilateral renal calculi are again noted. There is no hydronephrosis or perinephric fluid. There is a 9 mm calculus in the left renal pelvis. The adrenal glands and bladder are within normal  limits. Stomach/Bowel: There is wall thickening of the ascending colon and hepatic flexure. No dilated bowel loops are seen. The appendix is within normal limits. The stomach is decompressed. Vascular/Lymphatic: Aorta and IVC are normal in size. There are atherosclerotic calcifications of the aorta. Portal venous system grossly patent. There are numerous nonenlarged and prominent retroperitoneal lymph nodes. There is an enlarged portacaval lymph node measuring 2 cm short axis, unchanged. Reproductive: Prostate is unremarkable. Other: There is a moderate to large amount of ascites throughout the abdomen and pelvis. There is no free intraperitoneal air. There is body wall edema diffusely. Musculoskeletal: No acute or significant osseous findings. Review of the MIP images confirms the above findings. IMPRESSION: 1. No evidence for pulmonary embolism. 2. Stable enlarged subcarinal lymph node. 3. Stable cirrhotic liver with splenomegaly and moderate to large amount of ascites. 4. Wall thickening of the ascending colon and hepatic flexure worrisome for colitis. 5. Cholelithiasis. 6. Nonobstructing bilateral renal calculi. 7. Body wall edema. 8. Aortic atherosclerosis. Aortic Atherosclerosis (ICD10-I70.0). Electronically Signed   By: Darliss Cheney M.D.   On: 05/19/2023 21:22   CT Angio Chest PE W and/or Wo Contrast Result Date: 05/19/2023 CLINICAL DATA:  Positive D-dimer with shortness of breath. Mid abdominal pain and bloating. EXAM: CT ANGIOGRAPHY CHEST CT ABDOMEN AND PELVIS WITH CONTRAST TECHNIQUE: Multidetector CT imaging of the chest was performed using the standard protocol during bolus administration of intravenous contrast. Multiplanar CT image reconstructions and MIPs were obtained to evaluate the vascular anatomy. Multidetector CT imaging of the abdomen and pelvis was performed using the standard protocol during bolus administration of intravenous contrast. RADIATION DOSE REDUCTION: This exam was performed  according to the departmental dose-optimization program which includes automated exposure control, adjustment of the mA and/or kV according to patient size and/or use of iterative reconstruction technique. CONTRAST:  OMNIPAQUE IOHEXOL 350 MG/ML SOLN COMPARISON:  CT abdomen and pelvis 04/16/2020.  PET-CT 08/30/2019. FINDINGS: CTA CHEST FINDINGS Cardiovascular: Satisfactory opacification of the pulmonary arteries to the segmental level. No evidence of pulmonary embolism. Normal heart size. No pericardial effusion. There are atherosclerotic calcifications of the aorta and coronary arteries. Mediastinum/Nodes: There is an enlarged subcarinal lymph node measuring 12 mm similar to prior. No new enlarged lymph nodes are seen. Visualized esophagus and thyroid gland are within normal limits. Lungs/Pleura: There is a calcified granuloma in the right upper lobe. The lungs are otherwise clear. There is no pleural effusion or pneumothorax. Musculoskeletal: No acute fracture or focal osseous lesion. Left hemilaminectomy defects are seen at multiple lower thoracic levels, unchanged. Review of the MIP images confirms the above findings. CT ABDOMEN and PELVIS FINDINGS Hepatobiliary: The liver is diffusely heterogeneous with nodular liver contour compatible with cirrhosis. Gallstones are present. There is no biliary ductal dilatation. Pancreas: Unremarkable. No pancreatic ductal dilatation or surrounding inflammatory changes. Spleen:  The spleen is mildly enlarged, unchanged. Adrenals/Urinary Tract: .Multiple punctate bilateral renal calculi are again noted. There is no hydronephrosis or perinephric fluid. There is a 9 mm calculus in the left renal pelvis. The adrenal glands and bladder are within normal limits. Stomach/Bowel: There is wall thickening of the ascending colon and hepatic flexure. No dilated bowel loops are seen. The appendix is within normal limits. The stomach is decompressed. Vascular/Lymphatic: Aorta and IVC  are normal in size. There are atherosclerotic calcifications of the aorta. Portal venous system grossly patent. There are numerous nonenlarged and prominent retroperitoneal lymph nodes. There is an enlarged portacaval lymph node measuring 2 cm short axis, unchanged. Reproductive: Prostate is unremarkable. Other: There is a moderate to large amount of ascites throughout the abdomen and pelvis. There is no free intraperitoneal air. There is body wall edema diffusely. Musculoskeletal: No acute or significant osseous findings. Review of the MIP images confirms the above findings. IMPRESSION: 1. No evidence for pulmonary embolism. 2. Stable enlarged subcarinal lymph node. 3. Stable cirrhotic liver with splenomegaly and moderate to large amount of ascites. 4. Wall thickening of the ascending colon and hepatic flexure worrisome for colitis. 5. Cholelithiasis. 6. Nonobstructing bilateral renal calculi. 7. Body wall edema. 8. Aortic atherosclerosis. Aortic Atherosclerosis (ICD10-I70.0). Electronically Signed   By: Darliss Cheney M.D.   On: 05/19/2023 21:22   DG Chest 2 View Result Date: 05/19/2023 CLINICAL DATA:  Shortness of breath.  Rash.  Rectal bleeding. EXAM: CHEST - 2 VIEW COMPARISON:  10/19/2022 FINDINGS: Shallow inspiration. Heart size and pulmonary vascularity are normal. Mild atelectasis in the lung bases. No airspace disease or consolidation. No pleural effusion or pneumothorax. Mediastinal contours appear intact. Calcification of the aorta. Degenerative changes in the spine. IMPRESSION: Shallow inspiration with atelectasis in the lung bases. Electronically Signed   By: Burman Nieves M.D.   On: 05/19/2023 20:13   Assessment/Plan James Downs is a 58 y.o. male with medical history significant for hypertension, hypercholesterolemia, cirrhosis being admitted to the hospital with decompensated liver cirrhosis.   Anasarca in the setting of what appears to be decompensated liver cirrhosis of unclear  etiology.  With macrocytic anemia, thrombocytopenia, elevated INR.  Not on any blood thinners, no recent history of GI bleeding or dark stools. -Inpatient admission -Telemetry monitoring -Check ammonia, hepatitis panel, ferritin, transferrin -Check 2D echo -Will start Lasix and Aldactone  Hypertension, hyperlipidemia-not on home medications  Chronic renal insufficiency-current creatinine does not appear to be far off his baseline.  Will need to monitor closely with diuresis.  Rash-unclear etiology, could be related to prior viral illness. Will try empiric topical steroid.  DVT prophylaxis: SCDs only    Code Status: Full Code  Consults called: Discussed with GI Dr. Loreta Ave, who will see in consultation  Admission status: The appropriate patient status for this patient is INPATIENT. Inpatient status is judged to be reasonable and necessary in order to provide the required intensity of service to ensure the patient's safety. The patient's presenting symptoms, physical exam findings, and initial radiographic and laboratory data in the context of their chronic comorbidities is felt to place them at high risk for further clinical deterioration. Furthermore, it is not anticipated that the patient will be medically stable for discharge from the hospital within 2 midnights of admission.    I certify that at the point of admission it is my clinical judgment that the patient will require inpatient hospital care spanning beyond 2 midnights from the point of admission due to high  intensity of service, high risk for further deterioration and high frequency of surveillance required  Time spent: 55 minutes  Velita Quirk Sharlette Dense MD [facility] Pager [phone]  If 7PM-7AM, please contact night-coverage [email or web address] Password [email or web address]  05/20/2023, 2:16 PM

## 2023-05-20 NOTE — Progress Notes (Signed)
   05/20/23 2243  BiPAP/CPAP/SIPAP  BiPAP/CPAP/SIPAP Pt Type Adult  BiPAP/CPAP/SIPAP Resmed  Mask Type Nasal mask  Dentures removed? Not applicable  Mask Size Medium  EPAP 13 cmH2O (Per pt)  FiO2 (%) 21 %  Patient Home Machine No  Patient Home Mask No  Patient Home Tubing No  Auto Titrate No  Nasal massage performed No (comment)  Device Plugged into [employer] Power Outlet Yes

## 2023-05-20 NOTE — Consult Note (Addendum)
 Reason for Consult:Decompensated liver disease. Referring Physician: [facility]  Garfield KEAN GAUTREAU is an 58 y.o. male.  HPI: James Downs is a 58 year old white male with multiple medical problems listed below who has seen Dr. Jeani Hawking in the past and had a last colonoscopy done on 09/06/2019 with 8 tubular adenomas removed from his colon and EGD done was on start on the same day when small distal esophageal varices were noted along with portal hypertensive gastropathy. Patient was noted to have nodular contour of the liver at that time and was advised by Dr. Elnoria Howard about the diagnosis of cirrhosis but was lost to follow-up thereafter. He claims for the last 4 weeks he had worsening pedal edema with pain of his lower legs and increasing abdominal girth with shortness of breath. He has had some rash on his torso that was treated with steroids but [place] he drinks about 4 beers per year and denies indulging into heavy alcohol abuse.  He denies a history of Hepatitis B or C. In June 2021 he was noted to have numerous borderline enlarged retroperitoneal lymph nodes and a PET scan was recommended to rule out lymphoproliferative disease or retroperitoneal fibrosis but I do not see that this was ever done.  A CT angiogram done today showed no evidence of pulm embolism stable's enlarged subcarinal lymph nodes were noted with stable liver cirrhosis with splenomegaly and large amount of ascites with wall thickening of the ascending colon hepatic flexure worrisome for colitis along with cholelithiasis nonobstructing bilateral renal calculi body wall edema and aortic atherosclerosis.  Past Medical History:  Diagnosis Date   Arthritis    Depression    Gout    Hypercholesterolemia    Hypertension    Nephrolithiasis   Renal insufficiency     Kidney stone    Morbid obesity    Cirrhosis of the liver    Obstructive sleep apnea    uses CPAP   Past Surgical History:  Procedure Laterality Date   FOOT  SURGERY Right 1991   HAND SURGERY Right 1985   POSTERIOR LUMBAR FUSION 4 LEVEL N/A 11/29/2013   Procedure: Thoracic Eight, Thoracic Nine, Costotransversectomy   Thoracic Seven-Eight Thoracic Eight-Nine diskectomy;  Surgeon: Lisbeth Renshaw, MD;  Location: [facility];  Service: Neurosurgery;  Laterality: N/A;  Thoracic Eight, Thoracic Nine, Costotransversectomy   Thoracic Seven-Eight Thoracic Eight-Nine diskectomy   Family History  Problem Relation Age of Onset   Cancer Father        BRAIN/PROSTATE/SKIN   Hypertension Father    Cancer Paternal Grandfather    Heart failure Maternal Grandmother    Diabetes Maternal Grandmother    Hypertension Maternal Grandmother    Hypertension Maternal Grandfather    Social History:  reports that he has been smoking cigarettes. He has a 30 pack-year smoking history. He has quit using smokeless tobacco. He reports current alcohol use. He reports that he does not use drugs.  Allergies:  Allergies  Allergen Reactions   Other Other (See Comments)    Hay fever     Medications: I have reviewed the patient's current medications. Prior to Admission:  Medications Prior to Admission  Medication Sig Dispense Refill Last Dose/Taking   acetaminophen (TYLENOL) 500 MG tablet Take 1,500 mg by mouth at bedtime as needed for moderate pain (pain score 4-6).   05/18/2023   Simethicone 500 MG CAPS Take 500 mg by mouth at bedtime.   05/18/2023   albuterol (VENTOLIN HFA) 108 (90 Base) MCG/ACT inhaler Inhale 1-2 puffs  into the lungs every 6 (six) hours as needed for wheezing or shortness of breath. (Patient not taking: Reported on 05/20/2023) 6.7 g 0 Not Taking   allopurinol (ZYLOPRIM) 100 MG tablet Take 100 mg by mouth daily. (Patient not taking: Reported on 05/20/2023)   Not Taking   amLODipine (NORVASC) 2.5 MG tablet Take 1 tablet (2.5 mg total) by mouth daily. (Patient not taking: Reported on 05/20/2023) 30 tablet 1 Not Taking   doxycycline (VIBRAMYCIN) 100 MG capsule  Take 1 capsule (100 mg total) by mouth 2 (two) times daily. (Patient not taking: Reported on 05/20/2023) 20 capsule 0 Not Taking   PARoxetine (PAXIL) 40 MG tablet Take 40 mg by mouth daily. (Patient not taking: Reported on 05/20/2023)   Not Taking   predniSONE (DELTASONE) 20 MG tablet Take 2 tablets (40 mg total) by mouth daily. (Patient not taking: Reported on 05/20/2023) 8 tablet 0 Not Taking   telmisartan (MICARDIS) 20 MG tablet Take 1 tablet (20 mg total) by mouth daily. (Patient not taking: Reported on 05/20/2023) 30 tablet 0 Not Taking   tiZANidine (ZANAFLEX) 4 MG tablet Take 1 tablet (4 mg total) by mouth every 6 (six) hours as needed for muscle spasms. (Patient not taking: Reported on 05/20/2023) 30 tablet 0 Not Taking   Scheduled:  clobetasol ointment   Topical BID   furosemide  40 mg Intravenous Daily   spironolactone  100 mg Oral Daily   Continuous: ZOX:[id number], ibuprofen, ondansetron **OR** ondansetron (ZOFRAN) IV, simethicone, traZODone  Results for orders placed or performed during the hospital encounter of 05/19/23 (from the past 48 hours)  Resp panel by RT-PCR (RSV, Flu A&B, Covid) Anterior Nasal Swab     Status: None   Collection Time: 05/19/23  7:52 PM   Specimen: Anterior Nasal Swab  Result Value Ref Range   SARS Coronavirus 2 by RT PCR NEGATIVE NEGATIVE    Comment: (NOTE) SARS-CoV-2 target nucleic acids are NOT DETECTED.  The SARS-CoV-2 RNA is generally detectable in upper respiratory specimens during the acute phase of infection. The lowest concentration of SARS-CoV-2 viral copies this assay can detect is 138 copies/mL. A negative result does not preclude SARS-Cov-2 infection and should not be used as the sole basis for treatment or other patient management decisions. A negative result may occur with  improper specimen collection/handling, submission of specimen other than nasopharyngeal swab, presence of viral mutation(s) within the areas targeted by this assay, and  inadequate number of viral copies(<138 copies/mL). A negative result must be combined with clinical observations, patient history, and epidemiological information. The expected result is Negative.  Fact Sheet for Patients:  BloggerCourse.com  Fact Sheet for Healthcare Providers:  SeriousBroker.it  This test is no t yet approved or cleared by the [place] FDA and  has been authorized for detection and/or diagnosis of SARS-CoV-2 by FDA under an Emergency Use Authorization (EUA). This EUA will remain  in effect (meaning this test can be used) for the duration of the COVID-19 declaration under Section 564(b)(1) of the Act, 21 U.S.C.section 360bbb-3(b)(1), unless the authorization is terminated  or revoked sooner.       Influenza A by PCR NEGATIVE NEGATIVE   Influenza B by PCR NEGATIVE NEGATIVE    Comment: (NOTE) The Xpert Xpress SARS-CoV-2/FLU/RSV plus assay is intended as an aid in the diagnosis of influenza from Nasopharyngeal swab specimens and should not be used as a sole basis for treatment. Nasal washings and aspirates are unacceptable for Xpert Xpress SARS-CoV-2/FLU/RSV testing.  Fact  Sheet for Patients: BloggerCourse.com  Fact Sheet for Healthcare Providers: SeriousBroker.it  This test is not yet approved or cleared by the [place] FDA and has been authorized for detection and/or diagnosis of SARS-CoV-2 by FDA under an Emergency Use Authorization (EUA). This EUA will remain in effect (meaning this test can be used) for the duration of the COVID-19 declaration under Section 564(b)(1) of the Act, 21 U.S.C. section 360bbb-3(b)(1), unless the authorization is terminated or revoked.     Resp Syncytial Virus by PCR NEGATIVE NEGATIVE    Comment: (NOTE) Fact Sheet for Patients: BloggerCourse.com  Fact Sheet for Healthcare  Providers: SeriousBroker.it  This test is not yet approved or cleared by the [place] FDA and has been authorized for detection and/or diagnosis of SARS-CoV-2 by FDA under an Emergency Use Authorization (EUA). This EUA will remain in effect (meaning this test can be used) for the duration of the COVID-19 declaration under Section 564(b)(1) of the Act, 21 U.S.C. section 360bbb-3(b)(1), unless the authorization is terminated or revoked.  Performed at [facility], [address] [place], [place] [address]   Comprehensive metabolic panel     Status: Abnormal   Collection Time: 05/19/23  7:52 PM  Result Value Ref Range   Sodium 137 135 - 145 mmol/L   Potassium 3.7 3.5 - 5.1 mmol/L   Chloride 107 98 - 111 mmol/L   CO2 20 (L) 22 - 32 mmol/L   Glucose, Bld 98 70 - 99 mg/dL    Comment: Glucose reference range applies only to samples taken after fasting for at least 8 hours.   BUN 13 6 - 20 mg/dL   Creatinine, Ser 1.91 (H) 0.61 - 1.24 mg/dL   Calcium 8.2 (L) 8.9 - 10.3 mg/dL   Total Protein 7.3 6.5 - 8.1 g/dL   Albumin 2.6 (L) 3.5 - 5.0 g/dL   AST 478 (H) 15 - 41 U/L   ALT 77 (H) 0 - 44 U/L   Alkaline Phosphatase 98 38 - 126 U/L   Total Bilirubin 3.9 (H) 0.0 - 1.2 mg/dL   GFR, Estimated 56 (L) >60 mL/min    Comment: (NOTE) Calculated using the CKD-EPI Creatinine Equation (2021)    Anion gap 10 5 - 15    Comment: Performed at [facility], [address] [place], [place] [address]  CBC     Status: Abnormal   Collection Time: 05/19/23  7:52 PM  Result Value Ref Range   WBC 5.9 4.0 - 10.5 K/uL   RBC 3.74 (L) 4.22 - 5.81 MIL/uL   Hemoglobin 13.0 13.0 - 17.0 g/dL   HCT 13.0 (L) 86.5 - 78.4 %   MCV 102.4 (H) 80.0 - 100.0 fL   MCH 34.8 (H) 26.0 - 34.0 pg   MCHC 33.9 30.0 - 36.0 g/dL   RDW 69.6 (H) 29.5 - 28.4 %   Platelets 97 (L) 150 - 400 K/uL    Comment: SPECIMEN CHECKED FOR CLOTS REPEATED TO VERIFY    nRBC 0.0 0.0 - 0.2 %     Comment: Performed at [facility], [address] [place], [place] [address]  Differential     Status: None   Collection Time: 05/19/23  7:52 PM  Result Value Ref Range   Neutrophils Relative % 68 %   Neutro Abs 4.1 1.7 - 7.7 K/uL   Lymphocytes Relative 22 %   Lymphs Abs 1.3 0.7 - 4.0 K/uL   Monocytes Relative 9 %  Monocytes Absolute 0.6 0.1 - 1.0 K/uL   Eosinophils Relative 0 %   Eosinophils Absolute 0.0 0.0 - 0.5 K/uL   Basophils Relative 0 %   Basophils Absolute 0.0 0.0 - 0.1 K/uL   Immature Granulocytes 1 %   Abs Immature Granulocytes 0.03 0.00 - 0.07 K/uL    Comment: Performed at [facility], [address] [place], [place] [address]  Troponin I (High Sensitivity)     Status: None   Collection Time: 05/19/23  7:52 PM  Result Value Ref Range   Troponin I (High Sensitivity) 10 <18 ng/L    Comment: (NOTE) Elevated high sensitivity troponin I (hsTnI) values and significant  changes across serial measurements may suggest ACS but many other  chronic and acute conditions are known to elevate hsTnI results.  Refer to the "Links" section for chest pain algorithms and additional  guidance. Performed at [facility], [address] [place], [place] [address]   Brain natriuretic peptide     Status: None   Collection Time: 05/19/23  7:52 PM  Result Value Ref Range   B Natriuretic Peptide 56.5 0.0 - 100.0 pg/mL    Comment: Performed at [facility], [address] [place], [place] [address]  Lipase, blood     Status: None   Collection Time: 05/19/23  7:52 PM  Result Value Ref Range   Lipase 43 11 - 51 U/L    Comment: Performed at [facility], [address] [place], [place] [address]  D-dimer, quantitative     Status: Abnormal   Collection Time: 05/19/23  7:52 PM  Result Value Ref Range   D-Dimer, Quant 3.08 (H) 0.00 - 0.50 ug/mL-FEU    Comment: (NOTE) At the manufacturer cut-off value of 0.5 g/mL FEU, this assay  has a negative predictive value of 95-100%.This assay is intended for use in conjunction with a clinical pretest probability (PTP) assessment model to exclude pulmonary embolism (PE) and deep venous thrombosis (DVT) in outpatients suspected of PE or DVT. Results should be correlated with clinical presentation. Performed at [facility], [address] [place], [place] [address]   Urinalysis, Routine w reflex microscopic -Urine, Clean Catch     Status: Abnormal   Collection Time: 05/19/23  8:06 PM  Result Value Ref Range   Color, Urine YELLOW YELLOW   APPearance CLEAR CLEAR   Specific Gravity, Urine 1.025 1.005 - 1.030   pH 5.5 5.0 - 8.0   Glucose, UA NEGATIVE NEGATIVE mg/dL   Hgb urine dipstick MODERATE (A) NEGATIVE   Bilirubin Urine SMALL (A) NEGATIVE   Ketones, ur NEGATIVE NEGATIVE mg/dL   Protein, ur NEGATIVE NEGATIVE mg/dL   Nitrite NEGATIVE NEGATIVE   Leukocytes,Ua TRACE (A) NEGATIVE    Comment: Performed at [facility], [address] [place], [place] [address]  Urinalysis, Microscopic (reflex)     Status: Abnormal   Collection Time: 05/19/23  8:06 PM  Result Value Ref Range   RBC / HPF 11-20 0 - 5 RBC/hpf   WBC, UA 6-10 0 - 5 WBC/hpf   Bacteria, UA RARE (A) NONE SEEN   Squamous Epithelial / HPF 6-10 0 - 5 /HPF    Comment: Performed at [facility], [address] [place], [place] [address]  Troponin I (High Sensitivity)     Status: None   Collection Time: 05/19/23  9:54 PM  Result Value Ref  Range   Troponin I (High Sensitivity) 11 <18 ng/L    Comment: (NOTE) Elevated high sensitivity troponin I (hsTnI) values and significant  changes across serial measurements may suggest ACS but many other  chronic and acute conditions are known to elevate hsTnI results.  Refer to the "Links" section for chest pain algorithms and additional  guidance. Performed at [facility], [address] [place], [place] [address]   Protime-INR      Status: Abnormal   Collection Time: 05/19/23  9:54 PM  Result Value Ref Range   Prothrombin Time 19.8 (H) 11.4 - 15.2 seconds   INR 1.7 (H) 0.8 - 1.2    Comment: (NOTE) INR goal varies based on device and disease states. Performed at [facility], [address] [place], [place] [address]   Ammonia     Status: Abnormal   Collection Time: 05/20/23  2:01 PM  Result Value Ref Range   Ammonia 71 (H) 9 - 35 umol/L    Comment: Performed at [facility], [address], [place], [place] [address]    US Venous Img Lower Bilateral Result Date: 05/19/2023 CLINICAL DATA:  Bilateral lower extremity swelling. EXAM: BILATERAL LOWER EXTREMITY VENOUS DOPPLER ULTRASOUND TECHNIQUE: Gray-scale sonography with graded compression, as well as color Doppler and duplex ultrasound were performed to evaluate the lower extremity deep venous systems from the level of the common femoral vein and including the common femoral, femoral, profunda femoral, popliteal and calf veins including the posterior tibial, peroneal and gastrocnemius veins when visible. The superficial great saphenous vein was also interrogated. Spectral Doppler was utilized to evaluate flow at rest and with distal augmentation maneuvers in the common femoral, femoral and popliteal veins. COMPARISON:  December 10, 2018 FINDINGS: RIGHT LOWER EXTREMITY Common Femoral Vein: No evidence of thrombus. Normal compressibility, respiratory phasicity and response to augmentation. Saphenofemoral Junction: No evidence of thrombus. Normal compressibility and flow on color Doppler imaging. Profunda Femoral Vein: No evidence of thrombus. Normal compressibility and flow on color Doppler imaging. Femoral Vein: No evidence of thrombus. Normal compressibility, respiratory phasicity and response to augmentation. Popliteal Vein: No evidence of thrombus. Normal compressibility, respiratory phasicity and response to augmentation. Calf Veins: The  RIGHT posterior tibial vein and RIGHT peroneal vein are limited in visualization secondary to calf edema. Superficial Great Saphenous Vein: No evidence of thrombus. Normal compressibility. Venous Reflux:  None. Other Findings:  None. LEFT LOWER EXTREMITY Common Femoral Vein: No evidence of thrombus. Normal compressibility, respiratory phasicity and response to augmentation. Saphenofemoral Junction: No evidence of thrombus. Normal compressibility and flow on color Doppler imaging. Profunda Femoral Vein: No evidence of thrombus. Normal compressibility and flow on color Doppler imaging. Femoral Vein: No evidence of thrombus. Normal compressibility, respiratory phasicity and response to augmentation. Popliteal Vein: No evidence of thrombus. Normal compressibility, respiratory phasicity and response to augmentation. Calf Veins: The LEFT posterior tibial vein and LEFT peroneal vein are limited in visualization secondary to calf edema. Superficial Great Saphenous Vein: No evidence of thrombus. Normal compressibility. Venous Reflux:  None. Other Findings:  None. IMPRESSION: Limited visualization of the BILATERAL posterior tibial veins and BILATERAL peroneal veins, without evidence of deep venous thrombosis in either lower extremity. Electronically Signed   By: Aram Candela M.D.   On: 05/19/2023 21:42   CT ABDOMEN PELVIS W CONTRAST Result Date: 05/19/2023 CLINICAL DATA:  Positive D-dimer with shortness of breath. Mid abdominal pain and bloating. EXAM: CT ANGIOGRAPHY CHEST CT ABDOMEN AND PELVIS  WITH CONTRAST TECHNIQUE: Multidetector CT imaging of the chest was performed using the standard protocol during bolus administration of intravenous contrast. Multiplanar CT image reconstructions and MIPs were obtained to evaluate the vascular anatomy. Multidetector CT imaging of the abdomen and pelvis was performed using the standard protocol during bolus administration of intravenous contrast. RADIATION DOSE REDUCTION: This  exam was performed according to the departmental dose-optimization program which includes automated exposure control, adjustment of the mA and/or kV according to patient size and/or use of iterative reconstruction technique. CONTRAST:  OMNIPAQUE IOHEXOL 350 MG/ML SOLN COMPARISON:  CT abdomen and pelvis 04/16/2020.  PET-CT 08/30/2019. FINDINGS: CTA CHEST FINDINGS Cardiovascular: Satisfactory opacification of the pulmonary arteries to the segmental level. No evidence of pulmonary embolism. Normal heart size. No pericardial effusion. There are atherosclerotic calcifications of the aorta and coronary arteries. Mediastinum/Nodes: There is an enlarged subcarinal lymph node measuring 12 mm similar to prior. No new enlarged lymph nodes are seen. Visualized esophagus and thyroid gland are within normal limits. Lungs/Pleura: There is a calcified granuloma in the right upper lobe. The lungs are otherwise clear. There is no pleural effusion or pneumothorax. Musculoskeletal: No acute fracture or focal osseous lesion. Left hemilaminectomy defects are seen at multiple lower thoracic levels, unchanged. Review of the MIP images confirms the above findings. CT ABDOMEN and PELVIS FINDINGS Hepatobiliary: The liver is diffusely heterogeneous with nodular liver contour compatible with cirrhosis. Gallstones are present. There is no biliary ductal dilatation. Pancreas: Unremarkable. No pancreatic ductal dilatation or surrounding inflammatory changes. Spleen: The spleen is mildly enlarged, unchanged. Adrenals/Urinary Tract: .Multiple punctate bilateral renal calculi are again noted. There is no hydronephrosis or perinephric fluid. There is a 9 mm calculus in the left renal pelvis. The adrenal glands and bladder are within normal limits. Stomach/Bowel: There is wall thickening of the ascending colon and hepatic flexure. No dilated bowel loops are seen. The appendix is within normal limits. The stomach is decompressed.  Vascular/Lymphatic: Aorta and IVC are normal in size. There are atherosclerotic calcifications of the aorta. Portal venous system grossly patent. There are numerous nonenlarged and prominent retroperitoneal lymph nodes. There is an enlarged portacaval lymph node measuring 2 cm short axis, unchanged. Reproductive: Prostate is unremarkable. Other: There is a moderate to large amount of ascites throughout the abdomen and pelvis. There is no free intraperitoneal air. There is body wall edema diffusely. Musculoskeletal: No acute or significant osseous findings. Review of the MIP images confirms the above findings. IMPRESSION: 1. No evidence for pulmonary embolism. 2. Stable enlarged subcarinal lymph node. 3. Stable cirrhotic liver with splenomegaly and moderate to large amount of ascites. 4. Wall thickening of the ascending colon and hepatic flexure worrisome for colitis. 5. Cholelithiasis. 6. Nonobstructing bilateral renal calculi. 7. Body wall edema. 8. Aortic atherosclerosis. Aortic Atherosclerosis (ICD10-I70.0). Electronically Signed   By: Darliss Cheney M.D.   On: 05/19/2023 21:22   CT Angio Chest PE W and/or Wo Contrast Result Date: 05/19/2023 CLINICAL DATA:  Positive D-dimer with shortness of breath. Mid abdominal pain and bloating. EXAM: CT ANGIOGRAPHY CHEST CT ABDOMEN AND PELVIS WITH CONTRAST TECHNIQUE: Multidetector CT imaging of the chest was performed using the standard protocol during bolus administration of intravenous contrast. Multiplanar CT image reconstructions and MIPs were obtained to evaluate the vascular anatomy. Multidetector CT imaging of the abdomen and pelvis was performed using the standard protocol during bolus administration of intravenous contrast. RADIATION DOSE REDUCTION: This exam was performed according to the departmental dose-optimization program which includes automated exposure control,  adjustment of the mA and/or kV according to patient size and/or use of iterative reconstruction  technique. CONTRAST:  OMNIPAQUE IOHEXOL 350 MG/ML SOLN COMPARISON:  CT abdomen and pelvis 04/16/2020.  PET-CT 08/30/2019. FINDINGS: CTA CHEST FINDINGS Cardiovascular: Satisfactory opacification of the pulmonary arteries to the segmental level. No evidence of pulmonary embolism. Normal heart size. No pericardial effusion. There are atherosclerotic calcifications of the aorta and coronary arteries. Mediastinum/Nodes: There is an enlarged subcarinal lymph node measuring 12 mm similar to prior. No new enlarged lymph nodes are seen. Visualized esophagus and thyroid gland are within normal limits. Lungs/Pleura: There is a calcified granuloma in the right upper lobe. The lungs are otherwise clear. There is no pleural effusion or pneumothorax. Musculoskeletal: No acute fracture or focal osseous lesion. Left hemilaminectomy defects are seen at multiple lower thoracic levels, unchanged. Review of the MIP images confirms the above findings. CT ABDOMEN and PELVIS FINDINGS Hepatobiliary: The liver is diffusely heterogeneous with nodular liver contour compatible with cirrhosis. Gallstones are present. There is no biliary ductal dilatation. Pancreas: Unremarkable. No pancreatic ductal dilatation or surrounding inflammatory changes. Spleen: The spleen is mildly enlarged, unchanged. Adrenals/Urinary Tract: .Multiple punctate bilateral renal calculi are again noted. There is no hydronephrosis or perinephric fluid. There is a 9 mm calculus in the left renal pelvis. The adrenal glands and bladder are within normal limits. Stomach/Bowel: There is wall thickening of the ascending colon and hepatic flexure. No dilated bowel loops are seen. The appendix is within normal limits. The stomach is decompressed. Vascular/Lymphatic: Aorta and IVC are normal in size. There are atherosclerotic calcifications of the aorta. Portal venous system grossly patent. There are numerous nonenlarged and prominent retroperitoneal lymph nodes. There is an  enlarged portacaval lymph node measuring 2 cm short axis, unchanged. Reproductive: Prostate is unremarkable. Other: There is a moderate to large amount of ascites throughout the abdomen and pelvis. There is no free intraperitoneal air. There is body wall edema diffusely. Musculoskeletal: No acute or significant osseous findings. Review of the MIP images confirms the above findings. IMPRESSION: 1. No evidence for pulmonary embolism. 2. Stable enlarged subcarinal lymph node. 3. Stable cirrhotic liver with splenomegaly and moderate to large amount of ascites. 4. Wall thickening of the ascending colon and hepatic flexure worrisome for colitis. 5. Cholelithiasis. 6. Nonobstructing bilateral renal calculi. 7. Body wall edema. 8. Aortic atherosclerosis. Aortic Atherosclerosis (ICD10-I70.0). Electronically Signed   By: Darliss Cheney M.D.   On: 05/19/2023 21:22   DG Chest 2 View Result Date: 05/19/2023 CLINICAL DATA:  Shortness of breath.  Rash.  Rectal bleeding. EXAM: CHEST - 2 VIEW COMPARISON:  10/19/2022 FINDINGS: Shallow inspiration. Heart size and pulmonary vascularity are normal. Mild atelectasis in the lung bases. No airspace disease or consolidation. No pleural effusion or pneumothorax. Mediastinal contours appear intact. Calcification of the aorta. Degenerative changes in the spine. IMPRESSION: Shallow inspiration with atelectasis in the lung bases. Electronically Signed   By: Burman Nieves M.D.   On: 05/19/2023 20:13    Review of Systems Blood pressure 113/81, pulse 94, temperature 98.6 F (37 C), temperature source Oral, resp. rate 16, height 5\' 7"  (1.702 m), weight 108.7 kg, SpO2 99%. Physical Exam Constitutional:      General: He is not in acute distress.    Appearance: He is morbidly obese. He is ill-appearing and toxic-appearing. He is not diaphoretic.  HENT:     Head: Normocephalic and atraumatic.  Eyes:     Extraocular Movements: Extraocular movements intact.  Pupils: Pupils are equal,  round, and reactive to light.  Cardiovascular:     Rate and Rhythm: Normal rate and regular rhythm.  Pulmonary:     Effort: Pulmonary effort is normal.     Breath sounds: No decreased breath sounds or rales.  Abdominal:     General: Bowel sounds are normal.     Comments: Morbidly obese with distention secondary to ascites; a rash is noted of the torso  Musculoskeletal:     Cervical back: Normal range of motion and neck supple.  Skin:    General: Skin is warm and dry.     Coloration: Skin is jaundiced.  Neurological:     General: No focal deficit present.     Mental Status: He is oriented to person, place, and time.  Psychiatric:        Mood and Affect: Mood normal.        Behavior: Behavior normal. Behavior is cooperative.   Assessment/Plan: 1) Cirrhosis of unclear etiology/elevated liver enzymes with AST of 109 ALT of 77 total bilirubin of 3.9-I suspect he has Elita Boone cirrhosis but Hepatitis B and C serologies needs to be checked as well.  Anasarca, splenomegaly and ascites is noted on CT done on admission; thrombocytopenia with platelets of 97K; colopathy with INR 1.7 PT of 19.8-agree with gentle diuretics monitor renal function closely. 2) Cholelithiasis without any evidence of cholecystitis 3) Small distal esophageal varices noted in the distal  esophagus in 2021 patient is lost to follow-up thereafter 4) History of multiple colonic polyps tubular adenomas removed in 2021 in 2017 5) Renal insufficiency creatinine of 1.44 couple of days ago-renal consult.  6) HTN/hyperlipidemia. 7) Skin rash. 7) Morbid obesity obstructive sleep apnea. 8) Gout/arthrits. 9) Depression. 10) Eleveated MCV?/ETOH abuse. Charna Elizabeth 05/20/2023, 3:38 PM

## 2023-05-21 ENCOUNTER — Inpatient Hospital Stay (HOSPITAL_COMMUNITY)

## 2023-05-21 DIAGNOSIS — I5021 Acute systolic (congestive) heart failure: Secondary | ICD-10-CM | POA: Diagnosis not present

## 2023-05-21 DIAGNOSIS — K7460 Unspecified cirrhosis of liver: Secondary | ICD-10-CM | POA: Diagnosis not present

## 2023-05-21 DIAGNOSIS — K7290 Hepatic failure, unspecified without coma: Secondary | ICD-10-CM | POA: Diagnosis not present

## 2023-05-21 DIAGNOSIS — N189 Chronic kidney disease, unspecified: Secondary | ICD-10-CM | POA: Diagnosis not present

## 2023-05-21 DIAGNOSIS — K8020 Calculus of gallbladder without cholecystitis without obstruction: Secondary | ICD-10-CM | POA: Diagnosis not present

## 2023-05-21 DIAGNOSIS — I8500 Esophageal varices without bleeding: Secondary | ICD-10-CM | POA: Diagnosis not present

## 2023-05-21 DIAGNOSIS — R188 Other ascites: Secondary | ICD-10-CM | POA: Diagnosis not present

## 2023-05-21 DIAGNOSIS — R601 Generalized edema: Secondary | ICD-10-CM | POA: Diagnosis not present

## 2023-05-21 LAB — BODY FLUID CELL COUNT WITH DIFFERENTIAL
Eos, Fluid: 1 %
Lymphs, Fluid: 27 %
Monocyte-Macrophage-Serous Fluid: 44 % — ABNORMAL LOW (ref 50–90)
Neutrophil Count, Fluid: 28 % — ABNORMAL HIGH (ref 0–25)
Total Nucleated Cell Count, Fluid: 246 uL (ref 0–1000)

## 2023-05-21 LAB — ALBUMIN, PLEURAL OR PERITONEAL FLUID: Albumin, Fluid: <1.5 g/dL

## 2023-05-21 LAB — ECHOCARDIOGRAM COMPLETE
AR max vel: 3.29 cm2
AV Area VTI: 3.17 cm2
AV Area mean vel: 3.35 cm2
AV Mean grad: 5 mmHg
AV Peak grad: 8.9 mmHg
Ao pk vel: 1.49 m/s
Area-P 1/2: 4.1 cm2
Calc EF: 67.6 %
Height: 67 in
MV M vel: 4.66 m/s
MV Peak grad: 86.9 mmHg
MV VTI: 2.31 cm2
S' Lateral: 2.5 cm
Single Plane A2C EF: 69.3 %
Single Plane A4C EF: 70.5 %
Weight: 3834.24 [oz_av]

## 2023-05-21 LAB — LACTATE DEHYDROGENASE, PLEURAL OR PERITONEAL FLUID: LD, Fluid: 35 U/L — ABNORMAL HIGH (ref 3–23)

## 2023-05-21 LAB — CBC
HCT: 34.3 % — ABNORMAL LOW (ref 39.0–52.0)
Hemoglobin: 11.1 g/dL — ABNORMAL LOW (ref 13.0–17.0)
MCH: 34.4 pg — ABNORMAL HIGH (ref 26.0–34.0)
MCHC: 32.4 g/dL (ref 30.0–36.0)
MCV: 106.2 fL — ABNORMAL HIGH (ref 80.0–100.0)
Platelets: 89 10*3/uL — ABNORMAL LOW (ref 150–400)
RBC: 3.23 MIL/uL — ABNORMAL LOW (ref 4.22–5.81)
RDW: 16.3 % — ABNORMAL HIGH (ref 11.5–15.5)
WBC: 5.4 10*3/uL (ref 4.0–10.5)
nRBC: 0 % (ref 0.0–0.2)

## 2023-05-21 LAB — COMPREHENSIVE METABOLIC PANEL WITH GFR
ALT: 58 U/L — ABNORMAL HIGH (ref 0–44)
AST: 80 U/L — ABNORMAL HIGH (ref 15–41)
Albumin: 2.1 g/dL — ABNORMAL LOW (ref 3.5–5.0)
Alkaline Phosphatase: 78 U/L (ref 38–126)
Anion gap: 7 (ref 5–15)
BUN: 14 mg/dL (ref 6–20)
CO2: 21 mmol/L — ABNORMAL LOW (ref 22–32)
Calcium: 8 mg/dL — ABNORMAL LOW (ref 8.9–10.3)
Chloride: 110 mmol/L (ref 98–111)
Creatinine, Ser: 1.35 mg/dL — ABNORMAL HIGH (ref 0.61–1.24)
GFR, Estimated: >60 mL/min (ref >60)
Glucose, Bld: 85 mg/dL (ref 70–99)
Potassium: 3.5 mmol/L (ref 3.5–5.1)
Sodium: 138 mmol/L (ref 135–145)
Total Bilirubin: 3.6 mg/dL — ABNORMAL HIGH (ref 0.0–1.2)
Total Protein: 5.6 g/dL — ABNORMAL LOW (ref 6.5–8.1)

## 2023-05-21 LAB — GLUCOSE, PLEURAL OR PERITONEAL FLUID: Glucose, Fluid: 120 mg/dL

## 2023-05-21 LAB — CERULOPLASMIN: Ceruloplasmin: 16.5 mg/dL (ref 16.0–31.0)

## 2023-05-21 MED ORDER — LIDOCAINE HCL 1 % IJ SOLN
INTRAMUSCULAR | Status: AC
Start: 2023-05-21 — End: ?
  Filled 2023-05-21: qty 20

## 2023-05-21 MED ORDER — GUAIFENESIN 100 MG/5ML PO LIQD: 5.0000 mL | ORAL | Status: DC | PRN

## 2023-05-21 MED ORDER — SENNOSIDES-DOCUSATE SODIUM 8.6-50 MG PO TABS: 1.0000 | ORAL_TABLET | Freq: Every evening | ORAL | Status: DC | PRN

## 2023-05-21 MED ORDER — POTASSIUM CHLORIDE CRYS ER 20 MEQ PO TBCR
40.0000 meq | EXTENDED_RELEASE_TABLET | Freq: Once | ORAL | Status: AC
  Administered 2023-05-21: 40 meq via ORAL
  Filled 2023-05-21: qty 2

## 2023-05-21 MED ORDER — HYDRALAZINE HCL 20 MG/ML IJ SOLN: 10.0000 mg | INTRAMUSCULAR | Status: DC | PRN

## 2023-05-21 MED ORDER — GLUCAGON HCL RDNA (DIAGNOSTIC) 1 MG IJ SOLR: 1.0000 mg | INTRAMUSCULAR | Status: DC | PRN

## 2023-05-21 MED ORDER — METOPROLOL TARTRATE 5 MG/5ML IV SOLN: 5.0000 mg | INTRAVENOUS | Status: DC | PRN

## 2023-05-21 MED ORDER — ACETAMINOPHEN 325 MG PO TABS
650.0000 mg | ORAL_TABLET | Freq: Four times a day (QID) | ORAL | Status: DC | PRN
  Administered 2023-05-21 (×2): 650 mg via ORAL
  Filled 2023-05-21 (×2): qty 2

## 2023-05-21 NOTE — Plan of Care (Signed)
   Problem: Health Behavior/Discharge Planning: Goal: Ability to manage health-related needs will improve Outcome: Progressing   Problem: Clinical Measurements: Goal: Ability to maintain clinical measurements within normal limits will improve Outcome: Progressing Goal: Will remain free from infection Outcome: Progressing Goal: Diagnostic test results will improve Outcome: Progressing

## 2023-05-21 NOTE — Progress Notes (Signed)
 PROGRESS NOTE    James Downs  ZOX:[id number] DOB: 1965/05/08 DOA: 05/19/2023 PCP: Patient, No Pcp Per    Brief Narrative:   58 year old with history of HTN, HLD, decompensated liver cirrhosis followed by Dr. Elnoria Howard admitted to the hospital with concerns of abdominal distention secondary to decompensated liver failure.  He was also recently seen at an outpatient urgent care for nonspecific torso rash treated with steroids.  Assessment & Plan:  Principal Problem:   Anasarca   Anasarca Decompensated liver cirrhosis Thrombocytopenia, esophageal varices, moderate ascites - Liver cirrhosis etiologyIs not necessarily known at this time.  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  Hepatitis B and C serologies have been negative.  Denies any heavy alcohol use.  For volume overload currently getting diuretics which will further be adjusted depending on his renal function and blood pressure tolerance -No signs of encephalopathy, if needed we can start lactulose - Paracentesis ordered today-1.8 L removed.  Pending fluid studies - GI following -I think abdominal wall/colon wall thickening is secondary to anasarca.  Less suspicion for infectious etiology -Echocardiogram-EF 65%, mild MS -Ideally would require repeat endoscopy once medically stable.  Timing of this will be deferred to GI  Bilateral lower extremity swelling - Likely from decompensated liver cirrhosis, diuresis should help.  Elevate legs.  No DVT noted  Cholelithiasis without cholecystitis - Continue to monitor  Essential hypertension - Norvasc.  IV as needed - May need to add propranolol?  Hyperlipidemia - Not on any home meds.  Check lipid panel  Rash - Unclear etiology.  Likely viral on topical steroids   DVT prophylaxis: SCDs Start: 05/20/23 1340   Code Status: Full Code Family Communication:   Status is: Inpatient Remains inpatient appropriate because: Ongoing evaluation for cirrhosis    Subjective: Seen and examined at  bedside.  Reporting abdominal distention and lower extremity swelling.  Denies any alcohol use  Spouse at bedside and son over the phone   Examination:  General exam: Appears calm and comfortable  Respiratory system: Clear to auscultation. Respiratory effort normal. Cardiovascular system: S1 & S2 heard, RRR. No JVD, murmurs, rubs, gallops or clicks.  2+ bilateral lower extremity pitting edema Gastrointestinal system: Abdominal distention with fluid wave shift Central nervous system: Alert and oriented. No focal neurological deficits. Extremities: Symmetric 5 x 5 power. Skin: No rashes, lesions or ulcers Psychiatry: Judgement and insight appear normal. Mood & affect appropriate.                Diet Orders (From admission, onward)     Start     Ordered   05/20/23 1341  Diet regular Room service appropriate? Yes; Fluid consistency: Thin  Diet effective now       Question Answer Comment  Room service appropriate? Yes   Fluid consistency: Thin      05/20/23 1340            Objective: Vitals:   05/21/23 1030 05/21/23 1040 05/21/23 1050 05/21/23 1100  BP: 125/74 121/76 118/65 127/84  Pulse:      Resp:      Temp:      TempSrc:      SpO2:      Weight:      Height:        Intake/Output Summary (Last 24 hours) at 05/21/2023 1139 Last data filed at 05/21/2023 1000 Gross per 24 hour  Intake 720 ml  Output 2800 ml  Net -2080 ml   Filed Weights   05/19/23 1941 05/20/23 1400  Weight:  104.3 kg 108.7 kg    Scheduled Meds:  clobetasol ointment   Topical BID   furosemide  40 mg Intravenous Daily   spironolactone  100 mg Oral Daily   Continuous Infusions:  Nutritional status     Body mass index is 37.53 kg/m.  Data Reviewed:   CBC: Recent Labs  Lab 05/19/23 1952 05/21/23 0356  WBC 5.9 5.4  NEUTROABS 4.1  --   HGB 13.0 11.1*  HCT 38.3* 34.3*  MCV 102.4* 106.2*  PLT 97* 89*   Basic Metabolic Panel: Recent Labs  Lab 05/19/23 1952 05/21/23 0356   NA 137 138  K 3.7 3.5  CL 107 110  CO2 20* 21*  GLUCOSE 98 85  BUN 13 14  CREATININE 1.44* 1.35*  CALCIUM 8.2* 8.0*   GFR: Estimated Creatinine Clearance: 69.3 mL/min (A) (by C-G formula based on SCr of 1.35 mg/dL (H)). Liver Function Tests: Recent Labs  Lab 05/19/23 1952 05/21/23 0356  AST 109* 80*  ALT 77* 58*  ALKPHOS 98 78  BILITOT 3.9* 3.6*  PROT 7.3 5.6*  ALBUMIN 2.6* 2.1*   Recent Labs  Lab 05/19/23 1952  LIPASE 43   Recent Labs  Lab 05/20/23 1401  AMMONIA 71*   Coagulation Profile: Recent Labs  Lab 05/19/23 2154  INR 1.7*   Cardiac Enzymes: No results for input(s): "CKTOTAL", "CKMB", "CKMBINDEX", "TROPONINI" in the last 168 hours. BNP (last 3 results) No results for input(s): "PROBNP" in the last 8760 hours. HbA1C: No results for input(s): "HGBA1C" in the last 72 hours. CBG: No results for input(s): "GLUCAP" in the last 168 hours. Lipid Profile: No results for input(s): "CHOL", "HDL", "LDLCALC", "TRIG", "CHOLHDL", "LDLDIRECT" in the last 72 hours. Thyroid Function Tests: No results for input(s): "TSH", "T4TOTAL", "FREET4", "T3FREE", "THYROIDAB" in the last 72 hours. Anemia Panel: Recent Labs    05/20/23 1550  FERRITIN 387*   Sepsis Labs: No results for input(s): "PROCALCITON", "LATICACIDVEN" in the last 168 hours.  Recent Results (from the past 240 hours)  Resp panel by RT-PCR (RSV, Flu A&B, Covid) Anterior Nasal Swab     Status: None   Collection Time: 05/19/23  7:52 PM   Specimen: Anterior Nasal Swab  Result Value Ref Range Status   SARS Coronavirus 2 by RT PCR NEGATIVE NEGATIVE Final    Comment: (NOTE) SARS-CoV-2 target nucleic acids are NOT DETECTED.  The SARS-CoV-2 RNA is generally detectable in upper respiratory specimens during the acute phase of infection. The lowest concentration of SARS-CoV-2 viral copies this assay can detect is 138 copies/mL. A negative result does not preclude SARS-Cov-2 infection and should not be used  as the sole basis for treatment or other patient management decisions. A negative result may occur with  improper specimen collection/handling, submission of specimen other than nasopharyngeal swab, presence of viral mutation(s) within the areas targeted by this assay, and inadequate number of viral copies(<138 copies/mL). A negative result must be combined with clinical observations, patient history, and epidemiological information. The expected result is Negative.  Fact Sheet for Patients:  BloggerCourse.com  Fact Sheet for Healthcare Providers:  SeriousBroker.it  This test is no t yet approved or cleared by the [place] FDA and  has been authorized for detection and/or diagnosis of SARS-CoV-2 by FDA under an Emergency Use Authorization (EUA). This EUA will remain  in effect (meaning this test can be used) for the duration of the COVID-19 declaration under Section 564(b)(1) of the Act, 21 U.S.C.section 360bbb-3(b)(1), unless the authorization is terminated  or revoked sooner.       Influenza A by PCR NEGATIVE NEGATIVE Final   Influenza B by PCR NEGATIVE NEGATIVE Final    Comment: (NOTE) The Xpert Xpress SARS-CoV-2/FLU/RSV plus assay is intended as an aid in the diagnosis of influenza from Nasopharyngeal swab specimens and should not be used as a sole basis for treatment. Nasal washings and aspirates are unacceptable for Xpert Xpress SARS-CoV-2/FLU/RSV testing.  Fact Sheet for Patients: BloggerCourse.com  Fact Sheet for Healthcare Providers: SeriousBroker.it  This test is not yet approved or cleared by the [place] FDA and has been authorized for detection and/or diagnosis of SARS-CoV-2 by FDA under an Emergency Use Authorization (EUA). This EUA will remain in effect (meaning this test can be used) for the duration of the COVID-19 declaration under Section 564(b)(1)  of the Act, 21 U.S.C. section 360bbb-3(b)(1), unless the authorization is terminated or revoked.     Resp Syncytial Virus by PCR NEGATIVE NEGATIVE Final    Comment: (NOTE) Fact Sheet for Patients: BloggerCourse.com  Fact Sheet for Healthcare Providers: SeriousBroker.it  This test is not yet approved or cleared by the [place] FDA and has been authorized for detection and/or diagnosis of SARS-CoV-2 by FDA under an Emergency Use Authorization (EUA). This EUA will remain in effect (meaning this test can be used) for the duration of the COVID-19 declaration under Section 564(b)(1) of the Act, 21 U.S.C. section 360bbb-3(b)(1), unless the authorization is terminated or revoked.  Performed at [facility], [address] [place], [place] [address]          Radiology Studies: ECHOCARDIOGRAM COMPLETE Result Date: 05/21/2023    ECHOCARDIOGRAM REPORT   Patient Name:   James Downs Date of Exam: 05/21/2023 Medical Rec #:  [id number]           Height:       67.0 in Accession #:    [id number]          Weight:       239.6 lb Date of Birth:  03/31/1965            BSA:          2.184 m Patient Age:    58 years            BP:           115/68 mmHg Patient Gender: M                   HR:           95 bpm. Exam Location:  Inpatient Procedure: 2D Echo, Cardiac Doppler and Color Doppler (Both Spectral and Color            Flow Doppler were utilized during procedure). Indications:    CHF-acute systolic  History:        Patient has no prior history of Echocardiogram examinations.                 Risk Factors:Dyslipidemia and Hypertension.  Sonographer:    Vern Claude Referring Phys: [id number] [facility] IMPRESSIONS  1. Left ventricular ejection fraction, by estimation, is 60 to 65%. The left ventricle has normal function. The left ventricle has no regional wall motion abnormalities.  2. Right ventricular systolic function is normal.  The right ventricular size is mildly enlarged.  3. The mitral valve is normal in structure. Trivial mitral valve regurgitation. Mild to moderate mitral stenosis. The mean mitral valve gradient is 5.5 mmHg.  4. The aortic valve is normal in structure. Aortic valve regurgitation is not visualized. No aortic stenosis is present.  5. The inferior vena cava is normal in size with greater than 50% respiratory variability, suggesting right atrial pressure of 3 mmHg. FINDINGS  Left Ventricle: Left ventricular ejection fraction, by estimation, is 60 to 65%. The left ventricle has normal function. The left ventricle has no regional wall motion abnormalities. The left ventricular internal cavity size was normal in size. There is  no left ventricular hypertrophy. Right Ventricle: The right ventricular size is mildly enlarged. No increase in right ventricular wall thickness. Right ventricular systolic function is normal. Left Atrium: Left atrial size was normal in size. Right Atrium: Right atrial size was normal in size. Pericardium: There is no evidence of pericardial effusion. Mitral Valve: The mitral valve is normal in structure. Trivial mitral valve regurgitation. Mild to moderate mitral valve stenosis. MV peak gradient, 11.1 mmHg. The mean mitral valve gradient is 5.5 mmHg. Tricuspid Valve: The tricuspid valve is normal in structure. Tricuspid valve regurgitation is not demonstrated. No evidence of tricuspid stenosis. Aortic Valve: The aortic valve is normal in structure. Aortic valve regurgitation is not visualized. No aortic stenosis is present. Aortic valve mean gradient measures 5.0 mmHg. Aortic valve peak gradient measures 8.9 mmHg. Aortic valve area, by VTI measures 3.17 cm. Pulmonic Valve: The pulmonic valve was normal in structure. Pulmonic valve regurgitation is not visualized. No evidence of pulmonic stenosis. Aorta: The aortic root is normal in size and structure. Venous: The inferior vena cava is normal in size  with greater than 50% respiratory variability, suggesting right atrial pressure of 3 mmHg. IAS/Shunts: No atrial level shunt detected by color flow Doppler.  LEFT VENTRICLE PLAX 2D LVIDd:         4.00 cm      Diastology LVIDs:         2.50 cm      LV e' medial:    7.18 cm/s LV PW:         1.00 cm      LV E/e' medial:  19.8 LV IVS:        0.90 cm      LV e' lateral:   8.59 cm/s LVOT diam:     2.00 cm      LV E/e' lateral: 16.5 LV SV:         79 LV SV Index:   36 LVOT Area:     3.14 cm  LV Volumes (MOD) LV vol d, MOD A2C: 136.0 ml LV vol d, MOD A4C: 122.0 ml LV vol s, MOD A2C: 41.7 ml LV vol s, MOD A4C: 36.0 ml LV SV MOD A2C:     94.3 ml LV SV MOD A4C:     122.0 ml LV SV MOD BP:      86.9 ml RIGHT VENTRICLE RV Basal diam:  4.00 cm RV Mid diam:    2.10 cm RV S prime:     13.30 cm/s TAPSE (M-mode): 2.7 cm LEFT ATRIUM             Index        RIGHT ATRIUM           Index LA diam:        4.20 cm 1.92 cm/m   RA Area:     12.20 cm LA Vol (A2C):   52.1 ml 23.86 ml/m  RA Volume:   28.50 ml  13.05 ml/m LA Vol (A4C):  43.2 ml 19.78 ml/m LA Biplane Vol: 48.1 ml 22.03 ml/m  AORTIC VALVE                     PULMONIC VALVE AV Area (Vmax):    3.29 cm      PV Vmax:       1.05 m/s AV Area (Vmean):   3.35 cm      PV Peak grad:  4.4 mmHg AV Area (VTI):     3.17 cm AV Vmax:           149.00 cm/s AV Vmean:          96.700 cm/s AV VTI:            0.250 m AV Peak Grad:      8.9 mmHg AV Mean Grad:      5.0 mmHg LVOT Vmax:         156.00 cm/s LVOT Vmean:        103.000 cm/s LVOT VTI:          0.252 m LVOT/AV VTI ratio: 1.01  AORTA Ao Root diam: 3.50 cm Ao Asc diam:  3.30 cm MITRAL VALVE MV Area (PHT): 4.10 cm     SHUNTS MV Area VTI:   2.31 cm     Systemic VTI:  0.25 m MV Peak grad:  11.1 mmHg    Systemic Diam: 2.00 cm MV Mean grad:  5.5 mmHg MV Vmax:       1.66 m/s MV Vmean:      111.5 cm/s MV Decel Time: 185 msec MR Peak grad: 86.9 mmHg MR Vmax:      466.00 cm/s MV E velocity: 142.00 cm/s MV A velocity: 138.00 cm/s MV E/A ratio:   1.03 Donato Schultz MD Electronically signed by Donato Schultz MD Signature Date/Time: 05/21/2023/10:36:34 AM    Final    US Abdomen Complete Result Date: 05/20/2023 CLINICAL DATA:  Cirrhosis EXAM: ABDOMEN ULTRASOUND COMPLETE COMPARISON:  05/19/2023 CT scan FINDINGS: Gallbladder: Unable to visualize, probably due to being full of gallstones (as shown on yesterday's CT examination). Common bile duct: Diameter: 0.4 cm Liver: Hepatic cirrhosis with nodular contour and exaggerated echogenicity probably from steatosis. No focal lesion identified. Portal vein is patent on color Doppler imaging with normal direction of blood flow towards the liver. IVC: No abnormality visualized. Pancreas: Not visualized Spleen: Size and appearance within normal limits. Right Kidney: Length: 9.9 cm. Echogenicity within normal limits. No mass or hydronephrosis visualized. The patient has known right renal calculi were surprisingly difficult to visualize on ultrasound. Left Kidney: Length: 11.9 cm. Echogenicity within normal limits. No mass or hydronephrosis visualized. The patient has known left renal calculi are surprisingly difficult to visualize on ultrasound. Abdominal aorta: No aneurysm visualized. Other findings: Moderate ascites. Limitation: Body habitus IMPRESSION: 1. Hepatic cirrhosis with nodular contour and exaggerated echogenicity probably from steatosis. No focal lesion identified. 2. Moderate ascites. 3. The gallbladder is unable to be visualized, probably due to being full of gallstones (as shown on yesterday's CT examination). 4. The patient has known bilateral renal calculi which were not well characterized on today's ultrasound. No hydronephrosis observed. Electronically Signed   By: Gaylyn Rong M.D.   On: 05/20/2023 19:50   US Venous Img Lower Bilateral Result Date: 05/19/2023 CLINICAL DATA:  Bilateral lower extremity swelling. EXAM: BILATERAL LOWER EXTREMITY VENOUS DOPPLER ULTRASOUND TECHNIQUE: Gray-scale  sonography with graded compression, as well as color Doppler and duplex ultrasound were performed to evaluate the lower extremity  deep venous systems from the level of the common femoral vein and including the common femoral, femoral, profunda femoral, popliteal and calf veins including the posterior tibial, peroneal and gastrocnemius veins when visible. The superficial great saphenous vein was also interrogated. Spectral Doppler was utilized to evaluate flow at rest and with distal augmentation maneuvers in the common femoral, femoral and popliteal veins. COMPARISON:  December 10, 2018 FINDINGS: RIGHT LOWER EXTREMITY Common Femoral Vein: No evidence of thrombus. Normal compressibility, respiratory phasicity and response to augmentation. Saphenofemoral Junction: No evidence of thrombus. Normal compressibility and flow on color Doppler imaging. Profunda Femoral Vein: No evidence of thrombus. Normal compressibility and flow on color Doppler imaging. Femoral Vein: No evidence of thrombus. Normal compressibility, respiratory phasicity and response to augmentation. Popliteal Vein: No evidence of thrombus. Normal compressibility, respiratory phasicity and response to augmentation. Calf Veins: The RIGHT posterior tibial vein and RIGHT peroneal vein are limited in visualization secondary to calf edema. Superficial Great Saphenous Vein: No evidence of thrombus. Normal compressibility. Venous Reflux:  None. Other Findings:  None. LEFT LOWER EXTREMITY Common Femoral Vein: No evidence of thrombus. Normal compressibility, respiratory phasicity and response to augmentation. Saphenofemoral Junction: No evidence of thrombus. Normal compressibility and flow on color Doppler imaging. Profunda Femoral Vein: No evidence of thrombus. Normal compressibility and flow on color Doppler imaging. Femoral Vein: No evidence of thrombus. Normal compressibility, respiratory phasicity and response to augmentation. Popliteal Vein: No evidence of  thrombus. Normal compressibility, respiratory phasicity and response to augmentation. Calf Veins: The LEFT posterior tibial vein and LEFT peroneal vein are limited in visualization secondary to calf edema. Superficial Great Saphenous Vein: No evidence of thrombus. Normal compressibility. Venous Reflux:  None. Other Findings:  None. IMPRESSION: Limited visualization of the BILATERAL posterior tibial veins and BILATERAL peroneal veins, without evidence of deep venous thrombosis in either lower extremity. Electronically Signed   By: Aram Candela M.D.   On: 05/19/2023 21:42   CT ABDOMEN PELVIS W CONTRAST Result Date: 05/19/2023 CLINICAL DATA:  Positive D-dimer with shortness of breath. Mid abdominal pain and bloating. EXAM: CT ANGIOGRAPHY CHEST CT ABDOMEN AND PELVIS WITH CONTRAST TECHNIQUE: Multidetector CT imaging of the chest was performed using the standard protocol during bolus administration of intravenous contrast. Multiplanar CT image reconstructions and MIPs were obtained to evaluate the vascular anatomy. Multidetector CT imaging of the abdomen and pelvis was performed using the standard protocol during bolus administration of intravenous contrast. RADIATION DOSE REDUCTION: This exam was performed according to the departmental dose-optimization program which includes automated exposure control, adjustment of the mA and/or kV according to patient size and/or use of iterative reconstruction technique. CONTRAST:  OMNIPAQUE IOHEXOL 350 MG/ML SOLN COMPARISON:  CT abdomen and pelvis 04/16/2020.  PET-CT 08/30/2019. FINDINGS: CTA CHEST FINDINGS Cardiovascular: Satisfactory opacification of the pulmonary arteries to the segmental level. No evidence of pulmonary embolism. Normal heart size. No pericardial effusion. There are atherosclerotic calcifications of the aorta and coronary arteries. Mediastinum/Nodes: There is an enlarged subcarinal lymph node measuring 12 mm similar to prior. No new enlarged lymph  nodes are seen. Visualized esophagus and thyroid gland are within normal limits. Lungs/Pleura: There is a calcified granuloma in the right upper lobe. The lungs are otherwise clear. There is no pleural effusion or pneumothorax. Musculoskeletal: No acute fracture or focal osseous lesion. Left hemilaminectomy defects are seen at multiple lower thoracic levels, unchanged. Review of the MIP images confirms the above findings. CT ABDOMEN and PELVIS FINDINGS Hepatobiliary: The liver is diffusely heterogeneous with nodular  liver contour compatible with cirrhosis. Gallstones are present. There is no biliary ductal dilatation. Pancreas: Unremarkable. No pancreatic ductal dilatation or surrounding inflammatory changes. Spleen: The spleen is mildly enlarged, unchanged. Adrenals/Urinary Tract: .Multiple punctate bilateral renal calculi are again noted. There is no hydronephrosis or perinephric fluid. There is a 9 mm calculus in the left renal pelvis. The adrenal glands and bladder are within normal limits. Stomach/Bowel: There is wall thickening of the ascending colon and hepatic flexure. No dilated bowel loops are seen. The appendix is within normal limits. The stomach is decompressed. Vascular/Lymphatic: Aorta and IVC are normal in size. There are atherosclerotic calcifications of the aorta. Portal venous system grossly patent. There are numerous nonenlarged and prominent retroperitoneal lymph nodes. There is an enlarged portacaval lymph node measuring 2 cm short axis, unchanged. Reproductive: Prostate is unremarkable. Other: There is a moderate to large amount of ascites throughout the abdomen and pelvis. There is no free intraperitoneal air. There is body wall edema diffusely. Musculoskeletal: No acute or significant osseous findings. Review of the MIP images confirms the above findings. IMPRESSION: 1. No evidence for pulmonary embolism. 2. Stable enlarged subcarinal lymph node. 3. Stable cirrhotic liver with splenomegaly  and moderate to large amount of ascites. 4. Wall thickening of the ascending colon and hepatic flexure worrisome for colitis. 5. Cholelithiasis. 6. Nonobstructing bilateral renal calculi. 7. Body wall edema. 8. Aortic atherosclerosis. Aortic Atherosclerosis (ICD10-I70.0). Electronically Signed   By: Darliss Cheney M.D.   On: 05/19/2023 21:22   CT Angio Chest PE W and/or Wo Contrast Result Date: 05/19/2023 CLINICAL DATA:  Positive D-dimer with shortness of breath. Mid abdominal pain and bloating. EXAM: CT ANGIOGRAPHY CHEST CT ABDOMEN AND PELVIS WITH CONTRAST TECHNIQUE: Multidetector CT imaging of the chest was performed using the standard protocol during bolus administration of intravenous contrast. Multiplanar CT image reconstructions and MIPs were obtained to evaluate the vascular anatomy. Multidetector CT imaging of the abdomen and pelvis was performed using the standard protocol during bolus administration of intravenous contrast. RADIATION DOSE REDUCTION: This exam was performed according to the departmental dose-optimization program which includes automated exposure control, adjustment of the mA and/or kV according to patient size and/or use of iterative reconstruction technique. CONTRAST:  OMNIPAQUE IOHEXOL 350 MG/ML SOLN COMPARISON:  CT abdomen and pelvis 04/16/2020.  PET-CT 08/30/2019. FINDINGS: CTA CHEST FINDINGS Cardiovascular: Satisfactory opacification of the pulmonary arteries to the segmental level. No evidence of pulmonary embolism. Normal heart size. No pericardial effusion. There are atherosclerotic calcifications of the aorta and coronary arteries. Mediastinum/Nodes: There is an enlarged subcarinal lymph node measuring 12 mm similar to prior. No new enlarged lymph nodes are seen. Visualized esophagus and thyroid gland are within normal limits. Lungs/Pleura: There is a calcified granuloma in the right upper lobe. The lungs are otherwise clear. There is no pleural effusion or pneumothorax.  Musculoskeletal: No acute fracture or focal osseous lesion. Left hemilaminectomy defects are seen at multiple lower thoracic levels, unchanged. Review of the MIP images confirms the above findings. CT ABDOMEN and PELVIS FINDINGS Hepatobiliary: The liver is diffusely heterogeneous with nodular liver contour compatible with cirrhosis. Gallstones are present. There is no biliary ductal dilatation. Pancreas: Unremarkable. No pancreatic ductal dilatation or surrounding inflammatory changes. Spleen: The spleen is mildly enlarged, unchanged. Adrenals/Urinary Tract: .Multiple punctate bilateral renal calculi are again noted. There is no hydronephrosis or perinephric fluid. There is a 9 mm calculus in the left renal pelvis. The adrenal glands and bladder are within normal limits. Stomach/Bowel: There is wall  thickening of the ascending colon and hepatic flexure. No dilated bowel loops are seen. The appendix is within normal limits. The stomach is decompressed. Vascular/Lymphatic: Aorta and IVC are normal in size. There are atherosclerotic calcifications of the aorta. Portal venous system grossly patent. There are numerous nonenlarged and prominent retroperitoneal lymph nodes. There is an enlarged portacaval lymph node measuring 2 cm short axis, unchanged. Reproductive: Prostate is unremarkable. Other: There is a moderate to large amount of ascites throughout the abdomen and pelvis. There is no free intraperitoneal air. There is body wall edema diffusely. Musculoskeletal: No acute or significant osseous findings. Review of the MIP images confirms the above findings. IMPRESSION: 1. No evidence for pulmonary embolism. 2. Stable enlarged subcarinal lymph node. 3. Stable cirrhotic liver with splenomegaly and moderate to large amount of ascites. 4. Wall thickening of the ascending colon and hepatic flexure worrisome for colitis. 5. Cholelithiasis. 6. Nonobstructing bilateral renal calculi. 7. Body wall edema. 8. Aortic  atherosclerosis. Aortic Atherosclerosis (ICD10-I70.0). Electronically Signed   By: Darliss Cheney M.D.   On: 05/19/2023 21:22   DG Chest 2 View Result Date: 05/19/2023 CLINICAL DATA:  Shortness of breath.  Rash.  Rectal bleeding. EXAM: CHEST - 2 VIEW COMPARISON:  10/19/2022 FINDINGS: Shallow inspiration. Heart size and pulmonary vascularity are normal. Mild atelectasis in the lung bases. No airspace disease or consolidation. No pleural effusion or pneumothorax. Mediastinal contours appear intact. Calcification of the aorta. Degenerative changes in the spine. IMPRESSION: Shallow inspiration with atelectasis in the lung bases. Electronically Signed   By: Burman Nieves M.D.   On: 05/19/2023 20:13           LOS: 1 day   Time spent= 35 mins    Miguel Rota, MD [facility]  If 7PM-7AM, please contact night-coverage  05/21/2023, 11:39 AM

## 2023-05-21 NOTE — TOC CM/SW Note (Signed)
 Transition of Care [facility]) - Inpatient Brief Assessment  Patient Details  Name: James Downs MRN: [id number] Date of Birth: February 05, 1966  [facility]) CM/SW Contact:    Ewing Schlein, LCSW Phone Number: 05/21/2023, 1:51 PM  Clinical Narrative: Per chart review, patient does not have a PCP. CSW met with patient and spouse regarding PCP referral and patient is agreeable to CSW setting up a PCP follow up appointment. Patient is scheduled with Angus Seller on Friday Jul 17, 2023 at 1:00 PM at [facility] ([address] [place], [place] [address], [phone]). Appointment listed on AVS.  Transition of Care Asessment: Insurance and Status: Insurance coverage has been reviewed Patient has primary care physician: No (Patient agreeable to being set up with PCP appointment.) Home environment has been reviewed: Resides in home with spouse Prior level of function:: Independent with ADLs at baseline Prior/Current Home Services: No current home services Social Drivers of Health Review: SDOH reviewed no interventions necessary Readmission risk has been reviewed: Yes Transition of care needs: no transition of care needs at this time

## 2023-05-21 NOTE — Progress Notes (Signed)
  Echocardiogram 2D Echocardiogram has been performed.  Ocie Doyne RDCS 05/21/2023, 8:16 AM

## 2023-05-21 NOTE — Procedures (Signed)
 PROCEDURE SUMMARY:  Successful ultrasound guided paracentesis from the right lower quadrant.  Yielded 1.8 liters of yellow fluid.  No immediate complications.  The patient tolerated the procedure well.   Specimen was sent for labs.  EBL < 5mL  If the patient eventually requires >/=2 paracenteses in a 30 day period, screening evaluation by the [facility] will be assessed.  Vladimir Crofts, PA-C

## 2023-05-21 NOTE — Hospital Course (Addendum)
 Brief Narrative:   58 year old with history of HTN, HLD, decompensated liver cirrhosis followed by Dr. Elnoria Howard admitted to the hospital with concerns of abdominal distention secondary to decompensated liver failure.  He was also recently seen at an outpatient urgent care for nonspecific torso rash treated with steroids.  Assessment & Plan:  Principal Problem:   Anasarca   Anasarca Decompensated liver cirrhosis Thrombocytopenia, esophageal varices, moderate ascites - Liver cirrhosis etiologyIs not necessarily known at this time.  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  Hepatitis B and C serologies have been negative.  Denies any heavy alcohol use.  For volume overload currently getting diuretics which will further be adjusted depending on his renal function and blood pressure tolerance -No signs of encephalopathy, if needed we can start lactulose - Paracentesis ordered today-1.8 L removed.  Pending fluid studies - GI following -I think abdominal wall/colon wall thickening is secondary to anasarca.  Less suspicion for infectious etiology -Echocardiogram-EF 65%, mild MS -Ideally would require repeat endoscopy once medically stable.  Timing of this will be deferred to GI  Bilateral lower extremity swelling - Likely from decompensated liver cirrhosis, diuresis should help.  Elevate legs.  No DVT noted  Cholelithiasis without cholecystitis - Continue to monitor  Essential hypertension - Norvasc.  IV as needed - May need to add propranolol?  Hyperlipidemia - Not on any home meds.  Check lipid panel  Rash - Unclear etiology.  Likely viral on topical steroids   DVT prophylaxis: SCDs Start: 05/20/23 1340   Code Status: Full Code Family Communication:   Status is: Inpatient Remains inpatient appropriate because: Ongoing evaluation for cirrhosis    Subjective: Seen and examined at bedside.  Reporting abdominal distention and lower extremity swelling.  Denies any alcohol use  Spouse at bedside and  son over the phone   Examination:  General exam: Appears calm and comfortable  Respiratory system: Clear to auscultation. Respiratory effort normal. Cardiovascular system: S1 & S2 heard, RRR. No JVD, murmurs, rubs, gallops or clicks.  2+ bilateral lower extremity pitting edema Gastrointestinal system: Abdominal distention with fluid wave shift Central nervous system: Alert and oriented. No focal neurological deficits. Extremities: Symmetric 5 x 5 power. Skin: No rashes, lesions or ulcers Psychiatry: Judgement and insight appear normal. Mood & affect appropriate.

## 2023-05-21 NOTE — Progress Notes (Signed)
 Subjective: Feeling better.  S/p 1.8 liters of ascites removed.  Objective: Vital signs in last 24 hours: Temp:  [97.8 F (36.6 C)-98.3 F (36.8 C)] 98.3 F (36.8 C) (03/27 1302) Pulse Rate:  [89-95] 89 (03/27 1302) Resp:  [16-18] 18 (03/27 1302) BP: (109-131)/(42-84) 112/63 (03/27 1302) SpO2:  [95 %-99 %] 95 % (03/27 1302) FiO2 (%):  [21 %] 21 % (03/26 2243) Last BM Date : 05/21/23  Intake/Output from previous day: 03/26 0701 - 03/27 0700 In: 360 [P.O.:360] Out: 2800 [Urine:2800] Intake/Output this shift: Total I/O In: 360 [P.O.:360] Out: -   General appearance: alert and no distress GI: distended, soft, + ascites  Lab Results: Recent Labs    05/19/23 1952 05/21/23 0356  WBC 5.9 5.4  HGB 13.0 11.1*  HCT 38.3* 34.3*  PLT 97* 89*   BMET Recent Labs    05/19/23 1952 05/21/23 0356  NA 137 138  K 3.7 3.5  CL 107 110  CO2 20* 21*  GLUCOSE 98 85  BUN 13 14  CREATININE 1.44* 1.35*  CALCIUM 8.2* 8.0*   LFT Recent Labs    05/21/23 0356  PROT 5.6*  ALBUMIN 2.1*  AST 80*  ALT 58*  ALKPHOS 78  BILITOT 3.6*   PT/INR Recent Labs    05/19/23 2154  LABPROT 19.8*  INR 1.7*   Hepatitis Panel Recent Labs    05/20/23 1550  HEPBSAG NON REACTIVE  HCVAB NON REACTIVE  HEPAIGM NON REACTIVE  HEPBIGM NON REACTIVE   C-Diff No results for input(s): "CDIFFTOX" in the last 72 hours. Fecal Lactopherrin No results for input(s): "FECLLACTOFRN" in the last 72 hours.  Studies/Results: US Paracentesis Result Date: 05/21/2023 INDICATION: Chronic renal insufficiency, cirrhosis, ascites. Request received for diagnostic and therapeutic paracentesis up to 3 liters. EXAM: ULTRASOUND GUIDED DIAGNOSTIC AND THERAPEUTIC PARACENTESIS MEDICATIONS: 9 ml 1% lidocaine COMPLICATIONS: None immediate. PROCEDURE: Informed written consent was obtained from the patient after a discussion of the risks, benefits and alternatives to treatment. A timeout was performed prior to the initiation  of the procedure. Initial ultrasound scanning demonstrates a small to moderate amount of ascites within the right lower abdominal quadrant. The right lower abdomen was prepped and draped in the usual sterile fashion. 1% lidocaine was used for local anesthesia. Following this, a 19 gauge, 10-cm, Yueh catheter was introduced. An ultrasound image was saved for documentation purposes. The paracentesis was performed. The catheter was removed and a dressing was applied. The patient tolerated the procedure well without immediate post procedural complication. FINDINGS: A total of approximately 1.8 liters of yellow fluid was removed. Samples were sent to the laboratory as requested by the clinical team. IMPRESSION: Successful ultrasound-guided diagnostic and therapeutic paracentesis yielding 1.8 liters of peritoneal fluid. Performed by: James Crofts, PA-C Electronically Signed   By: James Downs M.D.   On: 05/21/2023 11:48   ECHOCARDIOGRAM COMPLETE Result Date: 05/21/2023    ECHOCARDIOGRAM REPORT   Patient Name:   DEMARRION Downs Date of Exam: 05/21/2023 Medical Rec #:  [id number]           Height:       67.0 in Accession #:    [id number]          Weight:       239.6 lb Date of Birth:  11/28/1965            BSA:          2.184 m Patient Age:    58 years  BP:           115/68 mmHg Patient Gender: M                   HR:           95 bpm. Exam Location:  Inpatient Procedure: 2D Echo, Cardiac Doppler and Color Doppler (Both Spectral and Color            Flow Doppler were utilized during procedure). Indications:    CHF-acute systolic  History:        Patient has no prior history of Echocardiogram examinations.                 Risk Factors:Dyslipidemia and Hypertension.  Sonographer:    Vern Claude Referring Phys: [id number] MIR M [facility] IMPRESSIONS  1. Left ventricular ejection fraction, by estimation, is 60 to 65%. The left ventricle has normal function. The left ventricle has no regional wall motion  abnormalities.  2. Right ventricular systolic function is normal. The right ventricular size is mildly enlarged.  3. The mitral valve is normal in structure. Trivial mitral valve regurgitation. Mild to moderate mitral stenosis. The mean mitral valve gradient is 5.5 mmHg.  4. The aortic valve is normal in structure. Aortic valve regurgitation is not visualized. No aortic stenosis is present.  5. The inferior vena cava is normal in size with greater than 50% respiratory variability, suggesting right atrial pressure of 3 mmHg. FINDINGS  Left Ventricle: Left ventricular ejection fraction, by estimation, is 60 to 65%. The left ventricle has normal function. The left ventricle has no regional wall motion abnormalities. The left ventricular internal cavity size was normal in size. There is  no left ventricular hypertrophy. Right Ventricle: The right ventricular size is mildly enlarged. No increase in right ventricular wall thickness. Right ventricular systolic function is normal. Left Atrium: Left atrial size was normal in size. Right Atrium: Right atrial size was normal in size. Pericardium: There is no evidence of pericardial effusion. Mitral Valve: The mitral valve is normal in structure. Trivial mitral valve regurgitation. Mild to moderate mitral valve stenosis. MV peak gradient, 11.1 mmHg. The mean mitral valve gradient is 5.5 mmHg. Tricuspid Valve: The tricuspid valve is normal in structure. Tricuspid valve regurgitation is not demonstrated. No evidence of tricuspid stenosis. Aortic Valve: The aortic valve is normal in structure. Aortic valve regurgitation is not visualized. No aortic stenosis is present. Aortic valve mean gradient measures 5.0 mmHg. Aortic valve peak gradient measures 8.9 mmHg. Aortic valve area, by VTI measures 3.17 cm. Pulmonic Valve: The pulmonic valve was normal in structure. Pulmonic valve regurgitation is not visualized. No evidence of pulmonic stenosis. Aorta: The aortic root is normal in  size and structure. Venous: The inferior vena cava is normal in size with greater than 50% respiratory variability, suggesting right atrial pressure of 3 mmHg. IAS/Shunts: No atrial level shunt detected by color flow Doppler.  LEFT VENTRICLE PLAX 2D LVIDd:         4.00 cm      Diastology LVIDs:         2.50 cm      LV e' medial:    7.18 cm/s LV PW:         1.00 cm      LV E/e' medial:  19.8 LV IVS:        0.90 cm      LV e' lateral:   8.59 cm/s LVOT diam:     2.00 cm  LV E/e' lateral: 16.5 LV SV:         79 LV SV Index:   36 LVOT Area:     3.14 cm  LV Volumes (MOD) LV vol d, MOD A2C: 136.0 ml LV vol d, MOD A4C: 122.0 ml LV vol s, MOD A2C: 41.7 ml LV vol s, MOD A4C: 36.0 ml LV SV MOD A2C:     94.3 ml LV SV MOD A4C:     122.0 ml LV SV MOD BP:      86.9 ml RIGHT VENTRICLE RV Basal diam:  4.00 cm RV Mid diam:    2.10 cm RV S prime:     13.30 cm/s TAPSE (M-mode): 2.7 cm LEFT ATRIUM             Index        RIGHT ATRIUM           Index LA diam:        4.20 cm 1.92 cm/m   RA Area:     12.20 cm LA Vol (A2C):   52.1 ml 23.86 ml/m  RA Volume:   28.50 ml  13.05 ml/m LA Vol (A4C):   43.2 ml 19.78 ml/m LA Biplane Vol: 48.1 ml 22.03 ml/m  AORTIC VALVE                     PULMONIC VALVE AV Area (Vmax):    3.29 cm      PV Vmax:       1.05 m/s AV Area (Vmean):   3.35 cm      PV Peak grad:  4.4 mmHg AV Area (VTI):     3.17 cm AV Vmax:           149.00 cm/s AV Vmean:          96.700 cm/s AV VTI:            0.250 m AV Peak Grad:      8.9 mmHg AV Mean Grad:      5.0 mmHg LVOT Vmax:         156.00 cm/s LVOT Vmean:        103.000 cm/s LVOT VTI:          0.252 m LVOT/AV VTI ratio: 1.01  AORTA Ao Root diam: 3.50 cm Ao Asc diam:  3.30 cm MITRAL VALVE MV Area (PHT): 4.10 cm     SHUNTS MV Area VTI:   2.31 cm     Systemic VTI:  0.25 m MV Peak grad:  11.1 mmHg    Systemic Diam: 2.00 cm MV Mean grad:  5.5 mmHg MV Vmax:       1.66 m/s MV Vmean:      111.5 cm/s MV Decel Time: 185 msec MR Peak grad: 86.9 mmHg MR Vmax:      466.00  cm/s MV E velocity: 142.00 cm/s MV A velocity: 138.00 cm/s MV E/A ratio:  1.03 Donato Schultz MD Electronically signed by Donato Schultz MD Signature Date/Time: 05/21/2023/10:36:34 AM    Final    US Abdomen Complete Result Date: 05/20/2023 CLINICAL DATA:  Cirrhosis EXAM: ABDOMEN ULTRASOUND COMPLETE COMPARISON:  05/19/2023 CT scan FINDINGS: Gallbladder: Unable to visualize, probably due to being full of gallstones (as shown on yesterday's CT examination). Common bile duct: Diameter: 0.4 cm Liver: Hepatic cirrhosis with nodular contour and exaggerated echogenicity probably from steatosis. No focal lesion identified. Portal vein is patent on color Doppler imaging with normal direction of blood flow towards the  liver. IVC: No abnormality visualized. Pancreas: Not visualized Spleen: Size and appearance within normal limits. Right Kidney: Length: 9.9 cm. Echogenicity within normal limits. No mass or hydronephrosis visualized. The patient has known right renal calculi were surprisingly difficult to visualize on ultrasound. Left Kidney: Length: 11.9 cm. Echogenicity within normal limits. No mass or hydronephrosis visualized. The patient has known left renal calculi are surprisingly difficult to visualize on ultrasound. Abdominal aorta: No aneurysm visualized. Other findings: Moderate ascites. Limitation: Body habitus IMPRESSION: 1. Hepatic cirrhosis with nodular contour and exaggerated echogenicity probably from steatosis. No focal lesion identified. 2. Moderate ascites. 3. The gallbladder is unable to be visualized, probably due to being full of gallstones (as shown on yesterday's CT examination). 4. The patient has known bilateral renal calculi which were not well characterized on today's ultrasound. No hydronephrosis observed. Electronically Signed   By: Gaylyn Rong M.D.   On: 05/20/2023 19:50   US Venous Img Lower Bilateral Result Date: 05/19/2023 CLINICAL DATA:  Bilateral lower extremity swelling. EXAM: BILATERAL  LOWER EXTREMITY VENOUS DOPPLER ULTRASOUND TECHNIQUE: Gray-scale sonography with graded compression, as well as color Doppler and duplex ultrasound were performed to evaluate the lower extremity deep venous systems from the level of the common femoral vein and including the common femoral, femoral, profunda femoral, popliteal and calf veins including the posterior tibial, peroneal and gastrocnemius veins when visible. The superficial great saphenous vein was also interrogated. Spectral Doppler was utilized to evaluate flow at rest and with distal augmentation maneuvers in the common femoral, femoral and popliteal veins. COMPARISON:  December 10, 2018 FINDINGS: RIGHT LOWER EXTREMITY Common Femoral Vein: No evidence of thrombus. Normal compressibility, respiratory phasicity and response to augmentation. Saphenofemoral Junction: No evidence of thrombus. Normal compressibility and flow on color Doppler imaging. Profunda Femoral Vein: No evidence of thrombus. Normal compressibility and flow on color Doppler imaging. Femoral Vein: No evidence of thrombus. Normal compressibility, respiratory phasicity and response to augmentation. Popliteal Vein: No evidence of thrombus. Normal compressibility, respiratory phasicity and response to augmentation. Calf Veins: The RIGHT posterior tibial vein and RIGHT peroneal vein are limited in visualization secondary to calf edema. Superficial Great Saphenous Vein: No evidence of thrombus. Normal compressibility. Venous Reflux:  None. Other Findings:  None. LEFT LOWER EXTREMITY Common Femoral Vein: No evidence of thrombus. Normal compressibility, respiratory phasicity and response to augmentation. Saphenofemoral Junction: No evidence of thrombus. Normal compressibility and flow on color Doppler imaging. Profunda Femoral Vein: No evidence of thrombus. Normal compressibility and flow on color Doppler imaging. Femoral Vein: No evidence of thrombus. Normal compressibility, respiratory phasicity  and response to augmentation. Popliteal Vein: No evidence of thrombus. Normal compressibility, respiratory phasicity and response to augmentation. Calf Veins: The LEFT posterior tibial vein and LEFT peroneal vein are limited in visualization secondary to calf edema. Superficial Great Saphenous Vein: No evidence of thrombus. Normal compressibility. Venous Reflux:  None. Other Findings:  None. IMPRESSION: Limited visualization of the BILATERAL posterior tibial veins and BILATERAL peroneal veins, without evidence of deep venous thrombosis in either lower extremity. Electronically Signed   By: Aram Candela M.D.   On: 05/19/2023 21:42   CT ABDOMEN PELVIS W CONTRAST Result Date: 05/19/2023 CLINICAL DATA:  Positive D-dimer with shortness of breath. Mid abdominal pain and bloating. EXAM: CT ANGIOGRAPHY CHEST CT ABDOMEN AND PELVIS WITH CONTRAST TECHNIQUE: Multidetector CT imaging of the chest was performed using the standard protocol during bolus administration of intravenous contrast. Multiplanar CT image reconstructions and MIPs were obtained to evaluate the vascular anatomy.  Multidetector CT imaging of the abdomen and pelvis was performed using the standard protocol during bolus administration of intravenous contrast. RADIATION DOSE REDUCTION: This exam was performed according to the departmental dose-optimization program which includes automated exposure control, adjustment of the mA and/or kV according to patient size and/or use of iterative reconstruction technique. CONTRAST:  OMNIPAQUE IOHEXOL 350 MG/ML SOLN COMPARISON:  CT abdomen and pelvis 04/16/2020.  PET-CT 08/30/2019. FINDINGS: CTA CHEST FINDINGS Cardiovascular: Satisfactory opacification of the pulmonary arteries to the segmental level. No evidence of pulmonary embolism. Normal heart size. No pericardial effusion. There are atherosclerotic calcifications of the aorta and coronary arteries. Mediastinum/Nodes: There is an enlarged subcarinal lymph  node measuring 12 mm similar to prior. No new enlarged lymph nodes are seen. Visualized esophagus and thyroid gland are within normal limits. Lungs/Pleura: There is a calcified granuloma in the right upper lobe. The lungs are otherwise clear. There is no pleural effusion or pneumothorax. Musculoskeletal: No acute fracture or focal osseous lesion. Left hemilaminectomy defects are seen at multiple lower thoracic levels, unchanged. Review of the MIP images confirms the above findings. CT ABDOMEN and PELVIS FINDINGS Hepatobiliary: The liver is diffusely heterogeneous with nodular liver contour compatible with cirrhosis. Gallstones are present. There is no biliary ductal dilatation. Pancreas: Unremarkable. No pancreatic ductal dilatation or surrounding inflammatory changes. Spleen: The spleen is mildly enlarged, unchanged. Adrenals/Urinary Tract: .Multiple punctate bilateral renal calculi are again noted. There is no hydronephrosis or perinephric fluid. There is a 9 mm calculus in the left renal pelvis. The adrenal glands and bladder are within normal limits. Stomach/Bowel: There is wall thickening of the ascending colon and hepatic flexure. No dilated bowel loops are seen. The appendix is within normal limits. The stomach is decompressed. Vascular/Lymphatic: Aorta and IVC are normal in size. There are atherosclerotic calcifications of the aorta. Portal venous system grossly patent. There are numerous nonenlarged and prominent retroperitoneal lymph nodes. There is an enlarged portacaval lymph node measuring 2 cm short axis, unchanged. Reproductive: Prostate is unremarkable. Other: There is a moderate to large amount of ascites throughout the abdomen and pelvis. There is no free intraperitoneal air. There is body wall edema diffusely. Musculoskeletal: No acute or significant osseous findings. Review of the MIP images confirms the above findings. IMPRESSION: 1. No evidence for pulmonary embolism. 2. Stable enlarged  subcarinal lymph node. 3. Stable cirrhotic liver with splenomegaly and moderate to large amount of ascites. 4. Wall thickening of the ascending colon and hepatic flexure worrisome for colitis. 5. Cholelithiasis. 6. Nonobstructing bilateral renal calculi. 7. Body wall edema. 8. Aortic atherosclerosis. Aortic Atherosclerosis (ICD10-I70.0). Electronically Signed   By: Darliss Cheney M.D.   On: 05/19/2023 21:22   CT Angio Chest PE W and/or Wo Contrast Result Date: 05/19/2023 CLINICAL DATA:  Positive D-dimer with shortness of breath. Mid abdominal pain and bloating. EXAM: CT ANGIOGRAPHY CHEST CT ABDOMEN AND PELVIS WITH CONTRAST TECHNIQUE: Multidetector CT imaging of the chest was performed using the standard protocol during bolus administration of intravenous contrast. Multiplanar CT image reconstructions and MIPs were obtained to evaluate the vascular anatomy. Multidetector CT imaging of the abdomen and pelvis was performed using the standard protocol during bolus administration of intravenous contrast. RADIATION DOSE REDUCTION: This exam was performed according to the departmental dose-optimization program which includes automated exposure control, adjustment of the mA and/or kV according to patient size and/or use of iterative reconstruction technique. CONTRAST:  OMNIPAQUE IOHEXOL 350 MG/ML SOLN COMPARISON:  CT abdomen and pelvis 04/16/2020.  PET-CT 08/30/2019.  FINDINGS: CTA CHEST FINDINGS Cardiovascular: Satisfactory opacification of the pulmonary arteries to the segmental level. No evidence of pulmonary embolism. Normal heart size. No pericardial effusion. There are atherosclerotic calcifications of the aorta and coronary arteries. Mediastinum/Nodes: There is an enlarged subcarinal lymph node measuring 12 mm similar to prior. No new enlarged lymph nodes are seen. Visualized esophagus and thyroid gland are within normal limits. Lungs/Pleura: There is a calcified granuloma in the right upper lobe. The lungs  are otherwise clear. There is no pleural effusion or pneumothorax. Musculoskeletal: No acute fracture or focal osseous lesion. Left hemilaminectomy defects are seen at multiple lower thoracic levels, unchanged. Review of the MIP images confirms the above findings. CT ABDOMEN and PELVIS FINDINGS Hepatobiliary: The liver is diffusely heterogeneous with nodular liver contour compatible with cirrhosis. Gallstones are present. There is no biliary ductal dilatation. Pancreas: Unremarkable. No pancreatic ductal dilatation or surrounding inflammatory changes. Spleen: The spleen is mildly enlarged, unchanged. Adrenals/Urinary Tract: .Multiple punctate bilateral renal calculi are again noted. There is no hydronephrosis or perinephric fluid. There is a 9 mm calculus in the left renal pelvis. The adrenal glands and bladder are within normal limits. Stomach/Bowel: There is wall thickening of the ascending colon and hepatic flexure. No dilated bowel loops are seen. The appendix is within normal limits. The stomach is decompressed. Vascular/Lymphatic: Aorta and IVC are normal in size. There are atherosclerotic calcifications of the aorta. Portal venous system grossly patent. There are numerous nonenlarged and prominent retroperitoneal lymph nodes. There is an enlarged portacaval lymph node measuring 2 cm short axis, unchanged. Reproductive: Prostate is unremarkable. Other: There is a moderate to large amount of ascites throughout the abdomen and pelvis. There is no free intraperitoneal air. There is body wall edema diffusely. Musculoskeletal: No acute or significant osseous findings. Review of the MIP images confirms the above findings. IMPRESSION: 1. No evidence for pulmonary embolism. 2. Stable enlarged subcarinal lymph node. 3. Stable cirrhotic liver with splenomegaly and moderate to large amount of ascites. 4. Wall thickening of the ascending colon and hepatic flexure worrisome for colitis. 5. Cholelithiasis. 6. Nonobstructing  bilateral renal calculi. 7. Body wall edema. 8. Aortic atherosclerosis. Aortic Atherosclerosis (ICD10-I70.0). Electronically Signed   By: Darliss Cheney M.D.   On: 05/19/2023 21:22   DG Chest 2 View Result Date: 05/19/2023 CLINICAL DATA:  Shortness of breath.  Rash.  Rectal bleeding. EXAM: CHEST - 2 VIEW COMPARISON:  10/19/2022 FINDINGS: Shallow inspiration. Heart size and pulmonary vascularity are normal. Mild atelectasis in the lung bases. No airspace disease or consolidation. No pleural effusion or pneumothorax. Mediastinal contours appear intact. Calcification of the aorta. Degenerative changes in the spine. IMPRESSION: Shallow inspiration with atelectasis in the lung bases. Electronically Signed   By: Burman Nieves M.D.   On: 05/19/2023 20:13    Medications: Scheduled:  clobetasol ointment   Topical BID   furosemide  40 mg Intravenous Daily   spironolactone  100 mg Oral Daily   Continuous:  Assessment/Plan: 1) Decompensated MASH cirrhosis. 2) Ascites.   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  It is difficult to accurately calculate the SAAG as the ascitic fluid is only reported as <1.5.  There is no evidence of SBP.  He seems to be diuresing well.  Plan: 1) Continue with Step I diuretics. 2) He was extensively counseled about a 2 g sodium diet and serving sizes. 3) Start Carvedilol 6.125 every day initially.  If no symptoms, he can be titrated up to  6.125 mg BID. 4) ? D/C home tomorrow.  LOS: 1 day   Leanette Eutsler D 05/21/2023, 4:34 PM

## 2023-05-22 ENCOUNTER — Other Ambulatory Visit (HOSPITAL_COMMUNITY): Payer: Self-pay

## 2023-05-22 DIAGNOSIS — R601 Generalized edema: Secondary | ICD-10-CM | POA: Diagnosis not present

## 2023-05-22 LAB — BASIC METABOLIC PANEL WITH GFR
Anion gap: 6 (ref 5–15)
BUN: 15 mg/dL (ref 6–20)
CO2: 21 mmol/L — ABNORMAL LOW (ref 22–32)
Calcium: 7.6 mg/dL — ABNORMAL LOW (ref 8.9–10.3)
Chloride: 109 mmol/L (ref 98–111)
Creatinine, Ser: 1.46 mg/dL — ABNORMAL HIGH (ref 0.61–1.24)
GFR, Estimated: 55 mL/min — ABNORMAL LOW (ref >60)
Glucose, Bld: 78 mg/dL (ref 70–99)
Potassium: 3.6 mmol/L (ref 3.5–5.1)
Sodium: 136 mmol/L (ref 135–145)

## 2023-05-22 LAB — CBC
HCT: 31.9 % — ABNORMAL LOW (ref 39.0–52.0)
Hemoglobin: 10.7 g/dL — ABNORMAL LOW (ref 13.0–17.0)
MCH: 35.3 pg — ABNORMAL HIGH (ref 26.0–34.0)
MCHC: 33.5 g/dL (ref 30.0–36.0)
MCV: 105.3 fL — ABNORMAL HIGH (ref 80.0–100.0)
Platelets: 87 10*3/uL — ABNORMAL LOW (ref 150–400)
RBC: 3.03 MIL/uL — ABNORMAL LOW (ref 4.22–5.81)
RDW: 16.1 % — ABNORMAL HIGH (ref 11.5–15.5)
WBC: 4.6 10*3/uL (ref 4.0–10.5)
nRBC: 0 % (ref 0.0–0.2)

## 2023-05-22 LAB — TRIGLYCERIDES, BODY FLUIDS: Triglycerides, Fluid: 29 mg/dL

## 2023-05-22 LAB — MAGNESIUM: Magnesium: 2 mg/dL (ref 1.7–2.4)

## 2023-05-22 MED ORDER — POTASSIUM CHLORIDE CRYS ER 20 MEQ PO TBCR
40.0000 meq | EXTENDED_RELEASE_TABLET | Freq: Once | ORAL | Status: AC
  Administered 2023-05-22: 40 meq via ORAL
  Filled 2023-05-22: qty 4

## 2023-05-22 MED ORDER — LACTULOSE 10 GM/15ML PO SOLN
20.0000 g | Freq: Two times a day (BID) | ORAL | 0 refills | Status: AC
Start: 2023-05-22 — End: ?
  Filled 2023-05-22: qty 236, 4d supply, fill #0

## 2023-05-22 MED ORDER — FUROSEMIDE 40 MG PO TABS
40.0000 mg | ORAL_TABLET | Freq: Every day | ORAL | 0 refills | Status: DC
  Filled 2023-05-22: qty 30, 30d supply, fill #0

## 2023-05-22 MED ORDER — CARVEDILOL 3.125 MG PO TABS
3.1250 mg | ORAL_TABLET | Freq: Every day | ORAL | 0 refills | Status: DC
  Filled 2023-05-22: qty 30, 30d supply, fill #0

## 2023-05-22 MED ORDER — LACTULOSE 10 GM/15ML PO SOLN
20.0000 g | Freq: Two times a day (BID) | ORAL | Status: DC
  Filled 2023-05-22: qty 30

## 2023-05-22 MED ORDER — SPIRONOLACTONE 100 MG PO TABS
100.0000 mg | ORAL_TABLET | Freq: Every day | ORAL | 0 refills | Status: DC
  Filled 2023-05-22: qty 30, 30d supply, fill #0

## 2023-05-22 NOTE — Progress Notes (Signed)
   05/22/23 0039  BiPAP/CPAP/SIPAP  BiPAP/CPAP/SIPAP Pt Type Adult  BiPAP/CPAP/SIPAP Resmed  Mask Type Nasal mask  Mask Size Medium  EPAP 13 cmH2O  FiO2 (%) 21 %  Patient Home Machine No  Patient Home Mask No  Patient Home Tubing No  Auto Titrate No  Nasal massage performed No (comment)  Device Plugged into RED Power Outlet Yes

## 2023-05-22 NOTE — Plan of Care (Signed)
   Problem: Health Behavior/Discharge Planning: Goal: Ability to manage health-related needs will improve Outcome: Progressing   Problem: Clinical Measurements: Goal: Ability to maintain clinical measurements within normal limits will improve Outcome: Progressing Goal: Will remain free from infection Outcome: Progressing Goal: Diagnostic test results will improve Outcome: Progressing Goal: Respiratory complications will improve Outcome: Progressing

## 2023-05-22 NOTE — Discharge Summary (Signed)
 Physician Discharge Summary  James Downs:[id number] DOB: 1965/06/24 DOA: 05/19/2023  PCP: Patient, No Pcp Per  Admit date: 05/19/2023 Discharge date: 05/22/2023  Admitted From: Home Disposition: Home  Recommendations for Outpatient Follow-up:  Follow up with PCP in 1-2 weeks Please obtain CMP/CBC in one week your next doctors visit.  Outpatient wound follow-up with Dr. Elnoria Howard from gastroenterology on May 25, 2023.  At that time repeat lab work we will be done and if creatinine is stable, he will be instructed to start Lasix.  In the meantime continue Aldactone, lactulose and Coreg which has been prescribed Advised low-salt diet  Discharge Condition: Stable CODE STATUS: Full code Diet recommendation: Low-salt  Brief/Interim Summary: Brief Narrative:   58 year old with history of HTN, HLD, decompensated liver cirrhosis followed by Dr. Elnoria Howard admitted to the hospital with concerns of abdominal distention secondary to decompensated liver failure.  He was also recently seen at an outpatient urgent care for nonspecific torso rash treated with steroids.  Patient underwent paracentesis on 3/27, 1.8 L was removed.  No SBP.  Echocardiogram showed preserved EF with mild MS.  Discussed case with GI who also saw the patient.  Patient will be discharged on Aldactone, Lasix and Coreg.  Lasix is to be started after repeat blood work on 3/31.  Medically stable for discharge  Assessment & Plan:  Principal Problem:   Anasarca   Anasarca Decompensated liver cirrhosis Thrombocytopenia, esophageal varices, moderate ascites - Liver cirrhosis etiologyIs not necessarily known at this time.  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  Hepatitis B and C serologies have been negative.  Denies any heavy alcohol use.  For volume overload currently getting diuretics which will further be adjusted depending on his renal function and blood pressure tolerance.  Has elevated lactulose with questionable mild encephalopathy  therefore started on lactulose twice daily. - Paracentesis 3/27-1.8 L removed.  No of SBP - GI following.  Started Coreg -I think abdominal wall/colon wall thickening is secondary to anasarca.  Less suspicion for infectious etiology -Echocardiogram-EF 65%, mild MS -Ideally would require repeat endoscopy once medically stable.  Timing of this will be deferred to GI  Bilateral lower extremity swelling - Likely from decompensated liver cirrhosis, diuresis should help.  Elevate legs.  No DVT noted  Cholelithiasis without cholecystitis - Continue to monitor  Essential hypertension - Norvasc.  IV as needed - Coreg initiated  Hyperlipidemia - Not on any home meds.    Rash - Unclear etiology.  Likely viral on topical steroids   DVT prophylaxis: SCDs Start: 05/20/23 1340   Code Status: Full Code Family Communication:   Status is: Inpatient Remains inpatient appropriate because: Discharge to a.  Cleared by GI  Subjective: Seen and examined at bedside.  Patient wants to go home as he feels much better after paracentesis yesterday. I discussed case with Dr. Elnoria Howard from GI who has cleared the patient for discharge as well   Examination:  General exam: Appears calm and comfortable  Respiratory system: Clear to auscultation. Respiratory effort normal. Cardiovascular system: S1 & S2 heard, RRR. No JVD, murmurs, rubs, gallops or clicks.  2+ bilateral lower extremity pitting edema Gastrointestinal system: Abdominal distention with fluid wave shift Central nervous system: Alert and oriented. No focal neurological deficits. Extremities: Symmetric 5 x 5 power. Skin: No rashes, lesions or ulcers Psychiatry: Judgement and insight appear normal. Mood & affect appropriate.    Discharge Diagnoses:  Principal Problem:   Anasarca      Discharge Exam: Vitals:   05/21/23 1302  05/22/23 0504  BP: 112/63 113/69  Pulse: 89 81  Resp: 18 20  Temp: 98.3 F (36.8 C) (!) 97.5 F (36.4 C)   SpO2: 95% 98%   Vitals:   05/21/23 1050 05/21/23 1100 05/21/23 1302 05/22/23 0504  BP: 118/65 127/84 112/63 113/69  Pulse:   89 81  Resp:   18 20  Temp:   98.3 F (36.8 C) (!) 97.5 F (36.4 C)  TempSrc:   Oral Oral  SpO2:   95% 98%  Weight:      Height:          Discharge Instructions   Allergies as of 05/22/2023       Reactions   Other Other (See Comments)   Hay fever         Medication List     STOP taking these medications    allopurinol 100 MG tablet Commonly known as: ZYLOPRIM   amLODipine 2.5 MG tablet Commonly known as: NORVASC   doxycycline 100 MG capsule Commonly known as: VIBRAMYCIN   PARoxetine 40 MG tablet Commonly known as: PAXIL   predniSONE 20 MG tablet Commonly known as: DELTASONE   Simethicone 500 MG Caps   telmisartan 20 MG tablet Commonly known as: MICARDIS   tiZANidine 4 MG tablet Commonly known as: Zanaflex       TAKE these medications    acetaminophen 500 MG tablet Commonly known as: TYLENOL Take 1,500 mg by mouth at bedtime as needed for moderate pain (pain score 4-6).   albuterol 108 (90 Base) MCG/ACT inhaler Commonly known as: VENTOLIN HFA Inhale 1-2 puffs into the lungs every 6 (six) hours as needed for wheezing or shortness of breath.   carvedilol 3.125 MG tablet Commonly known as: Coreg Take 1 tablet (3.125 mg total) by mouth daily in the afternoon.   furosemide 40 MG tablet Commonly known as: Lasix Take 1 tablet (40 mg total) by mouth daily. Start taking on: May 26, 2023   lactulose 10 GM/15ML solution Commonly known as: CHRONULAC Take 30 mLs (20 g total) by mouth 2 (two) times daily.   spironolactone 100 MG tablet Commonly known as: ALDACTONE Take 1 tablet (100 mg total) by mouth daily.        Allergies  Allergen Reactions   Other Other (See Comments)    Hay fever     You were cared for by a hospitalist during your hospital stay. If you have any questions about your discharge medications  or the care you received while you were in the hospital after you are discharged, you can call the unit and asked to speak with the hospitalist on call if the hospitalist that took care of you is not available. Once you are discharged, your primary care physician will handle any further medical issues. Please note that no refills for any discharge medications will be authorized once you are discharged, as it is imperative that you return to your primary care physician (or establish a relationship with a primary care physician if you do not have one) for your aftercare needs so that they can reassess your need for medications and monitor your lab values.  You were cared for by a hospitalist during your hospital stay. If you have any questions about your discharge medications or the care you received while you were in the hospital after you are discharged, you can call the unit and asked to speak with the hospitalist on call if the hospitalist that took care of you is not available.  Once you are discharged, your primary care physician will handle any further medical issues. Please note that NO REFILLS for any discharge medications will be authorized once you are discharged, as it is imperative that you return to your primary care physician (or establish a relationship with a primary care physician if you do not have one) for your aftercare needs so that they can reassess your need for medications and monitor your lab values.  Please request your Prim.MD to go over all Hospital Tests and Procedure/Radiological results at the follow up, please get all Hospital records sent to your Prim MD by signing hospital release before you go home.  Get CBC, CMP, 2 view Chest X ray checked  by Primary MD during your next visit or SNF MD in 5-7 days ( we routinely change or add medications that can affect your baseline labs and fluid status, therefore we recommend that you get the mentioned basic workup next visit with your  PCP, your PCP may decide not to get them or add new tests based on their clinical decision)  On your next visit with your primary care physician please Get Medicines reviewed and adjusted.  If you experience worsening of your admission symptoms, develop shortness of breath, life threatening emergency, suicidal or homicidal thoughts you must seek medical attention immediately by calling 911 or calling your MD immediately  if symptoms less severe.  You Must read complete instructions/literature along with all the possible adverse reactions/side effects for all the Medicines you take and that have been prescribed to you. Take any new Medicines after you have completely understood and accpet all the possible adverse reactions/side effects.   Do not drive, operate heavy machinery, perform activities at heights, swimming or participation in water activities or provide baby sitting services if your were admitted for syncope or siezures until you have seen by Primary MD or a Neurologist and advised to do so again.  Do not drive when taking Pain medications.   Procedures/Studies: US Paracentesis Result Date: 05/21/2023 INDICATION: Chronic renal insufficiency, cirrhosis, ascites. Request received for diagnostic and therapeutic paracentesis up to 3 liters. EXAM: ULTRASOUND GUIDED DIAGNOSTIC AND THERAPEUTIC PARACENTESIS MEDICATIONS: 9 ml 1% lidocaine COMPLICATIONS: None immediate. PROCEDURE: Informed written consent was obtained from the patient after a discussion of the risks, benefits and alternatives to treatment. A timeout was performed prior to the initiation of the procedure. Initial ultrasound scanning demonstrates a small to moderate amount of ascites within the right lower abdominal quadrant. The right lower abdomen was prepped and draped in the usual sterile fashion. 1% lidocaine was used for local anesthesia. Following this, a 19 gauge, 10-cm, Yueh catheter was introduced. An ultrasound image was saved  for documentation purposes. The paracentesis was performed. The catheter was removed and a dressing was applied. The patient tolerated the procedure well without immediate post procedural complication. FINDINGS: A total of approximately 1.8 liters of yellow fluid was removed. Samples were sent to the laboratory as requested by the clinical team. IMPRESSION: Successful ultrasound-guided diagnostic and therapeutic paracentesis yielding 1.8 liters of peritoneal fluid. Performed by: Vladimir Crofts, PA-C Electronically Signed   By: Richarda Overlie M.D.   On: 05/21/2023 11:48   ECHOCARDIOGRAM COMPLETE Result Date: 05/21/2023    ECHOCARDIOGRAM REPORT   Patient Name:   LEOCADIO HEAL Date of Exam: 05/21/2023 Medical Rec #:  [id number]           Height:       67.0 in Accession #:    [id number]  Weight:       239.6 lb Date of Birth:  1966-02-23            BSA:          2.184 m Patient Age:    58 years            BP:           115/68 mmHg Patient Gender: M                   HR:           95 bpm. Exam Location:  Inpatient Procedure: 2D Echo, Cardiac Doppler and Color Doppler (Both Spectral and Color            Flow Doppler were utilized during procedure). Indications:    CHF-acute systolic  History:        Patient has no prior history of Echocardiogram examinations.                 Risk Factors:Dyslipidemia and Hypertension.  Sonographer:    Vern Claude Referring Phys: [id number] MIR M [facility] IMPRESSIONS  1. Left ventricular ejection fraction, by estimation, is 60 to 65%. The left ventricle has normal function. The left ventricle has no regional wall motion abnormalities.  2. Right ventricular systolic function is normal. The right ventricular size is mildly enlarged.  3. The mitral valve is normal in structure. Trivial mitral valve regurgitation. Mild to moderate mitral stenosis. The mean mitral valve gradient is 5.5 mmHg.  4. The aortic valve is normal in structure. Aortic valve regurgitation is not visualized. No  aortic stenosis is present.  5. The inferior vena cava is normal in size with greater than 50% respiratory variability, suggesting right atrial pressure of 3 mmHg. FINDINGS  Left Ventricle: Left ventricular ejection fraction, by estimation, is 60 to 65%. The left ventricle has normal function. The left ventricle has no regional wall motion abnormalities. The left ventricular internal cavity size was normal in size. There is  no left ventricular hypertrophy. Right Ventricle: The right ventricular size is mildly enlarged. No increase in right ventricular wall thickness. Right ventricular systolic function is normal. Left Atrium: Left atrial size was normal in size. Right Atrium: Right atrial size was normal in size. Pericardium: There is no evidence of pericardial effusion. Mitral Valve: The mitral valve is normal in structure. Trivial mitral valve regurgitation. Mild to moderate mitral valve stenosis. MV peak gradient, 11.1 mmHg. The mean mitral valve gradient is 5.5 mmHg. Tricuspid Valve: The tricuspid valve is normal in structure. Tricuspid valve regurgitation is not demonstrated. No evidence of tricuspid stenosis. Aortic Valve: The aortic valve is normal in structure. Aortic valve regurgitation is not visualized. No aortic stenosis is present. Aortic valve mean gradient measures 5.0 mmHg. Aortic valve peak gradient measures 8.9 mmHg. Aortic valve area, by VTI measures 3.17 cm. Pulmonic Valve: The pulmonic valve was normal in structure. Pulmonic valve regurgitation is not visualized. No evidence of pulmonic stenosis. Aorta: The aortic root is normal in size and structure. Venous: The inferior vena cava is normal in size with greater than 50% respiratory variability, suggesting right atrial pressure of 3 mmHg. IAS/Shunts: No atrial level shunt detected by color flow Doppler.  LEFT VENTRICLE PLAX 2D LVIDd:         4.00 cm      Diastology LVIDs:         2.50 cm      LV e' medial:    7.18 cm/s  LV PW:         1.00 cm       LV E/e' medial:  19.8 LV IVS:        0.90 cm      LV e' lateral:   8.59 cm/s LVOT diam:     2.00 cm      LV E/e' lateral: 16.5 LV SV:         79 LV SV Index:   36 LVOT Area:     3.14 cm  LV Volumes (MOD) LV vol d, MOD A2C: 136.0 ml LV vol d, MOD A4C: 122.0 ml LV vol s, MOD A2C: 41.7 ml LV vol s, MOD A4C: 36.0 ml LV SV MOD A2C:     94.3 ml LV SV MOD A4C:     122.0 ml LV SV MOD BP:      86.9 ml RIGHT VENTRICLE RV Basal diam:  4.00 cm RV Mid diam:    2.10 cm RV S prime:     13.30 cm/s TAPSE (M-mode): 2.7 cm LEFT ATRIUM             Index        RIGHT ATRIUM           Index LA diam:        4.20 cm 1.92 cm/m   RA Area:     12.20 cm LA Vol (A2C):   52.1 ml 23.86 ml/m  RA Volume:   28.50 ml  13.05 ml/m LA Vol (A4C):   43.2 ml 19.78 ml/m LA Biplane Vol: 48.1 ml 22.03 ml/m  AORTIC VALVE                     PULMONIC VALVE AV Area (Vmax):    3.29 cm      PV Vmax:       1.05 m/s AV Area (Vmean):   3.35 cm      PV Peak grad:  4.4 mmHg AV Area (VTI):     3.17 cm AV Vmax:           149.00 cm/s AV Vmean:          96.700 cm/s AV VTI:            0.250 m AV Peak Grad:      8.9 mmHg AV Mean Grad:      5.0 mmHg LVOT Vmax:         156.00 cm/s LVOT Vmean:        103.000 cm/s LVOT VTI:          0.252 m LVOT/AV VTI ratio: 1.01  AORTA Ao Root diam: 3.50 cm Ao Asc diam:  3.30 cm MITRAL VALVE MV Area (PHT): 4.10 cm     SHUNTS MV Area VTI:   2.31 cm     Systemic VTI:  0.25 m MV Peak grad:  11.1 mmHg    Systemic Diam: 2.00 cm MV Mean grad:  5.5 mmHg MV Vmax:       1.66 m/s MV Vmean:      111.5 cm/s MV Decel Time: 185 msec MR Peak grad: 86.9 mmHg MR Vmax:      466.00 cm/s MV E velocity: 142.00 cm/s MV A velocity: 138.00 cm/s MV E/A ratio:  1.03 Donato Schultz MD Electronically signed by Donato Schultz MD Signature Date/Time: 05/21/2023/10:36:34 AM    Final    US Abdomen Complete Result Date: 05/20/2023 CLINICAL DATA:  Cirrhosis EXAM: ABDOMEN ULTRASOUND COMPLETE COMPARISON:  05/19/2023  CT scan FINDINGS: Gallbladder: Unable to visualize,  probably due to being full of gallstones (as shown on yesterday's CT examination). Common bile duct: Diameter: 0.4 cm Liver: Hepatic cirrhosis with nodular contour and exaggerated echogenicity probably from steatosis. No focal lesion identified. Portal vein is patent on color Doppler imaging with normal direction of blood flow towards the liver. IVC: No abnormality visualized. Pancreas: Not visualized Spleen: Size and appearance within normal limits. Right Kidney: Length: 9.9 cm. Echogenicity within normal limits. No mass or hydronephrosis visualized. The patient has known right renal calculi were surprisingly difficult to visualize on ultrasound. Left Kidney: Length: 11.9 cm. Echogenicity within normal limits. No mass or hydronephrosis visualized. The patient has known left renal calculi are surprisingly difficult to visualize on ultrasound. Abdominal aorta: No aneurysm visualized. Other findings: Moderate ascites. Limitation: Body habitus IMPRESSION: 1. Hepatic cirrhosis with nodular contour and exaggerated echogenicity probably from steatosis. No focal lesion identified. 2. Moderate ascites. 3. The gallbladder is unable to be visualized, probably due to being full of gallstones (as shown on yesterday's CT examination). 4. The patient has known bilateral renal calculi which were not well characterized on today's ultrasound. No hydronephrosis observed. Electronically Signed   By: Gaylyn Rong M.D.   On: 05/20/2023 19:50   US Venous Img Lower Bilateral Result Date: 05/19/2023 CLINICAL DATA:  Bilateral lower extremity swelling. EXAM: BILATERAL LOWER EXTREMITY VENOUS DOPPLER ULTRASOUND TECHNIQUE: Gray-scale sonography with graded compression, as well as color Doppler and duplex ultrasound were performed to evaluate the lower extremity deep venous systems from the level of the common femoral vein and including the common femoral, femoral, profunda femoral, popliteal and calf veins including the posterior  tibial, peroneal and gastrocnemius veins when visible. The superficial great saphenous vein was also interrogated. Spectral Doppler was utilized to evaluate flow at rest and with distal augmentation maneuvers in the common femoral, femoral and popliteal veins. COMPARISON:  December 10, 2018 FINDINGS: RIGHT LOWER EXTREMITY Common Femoral Vein: No evidence of thrombus. Normal compressibility, respiratory phasicity and response to augmentation. Saphenofemoral Junction: No evidence of thrombus. Normal compressibility and flow on color Doppler imaging. Profunda Femoral Vein: No evidence of thrombus. Normal compressibility and flow on color Doppler imaging. Femoral Vein: No evidence of thrombus. Normal compressibility, respiratory phasicity and response to augmentation. Popliteal Vein: No evidence of thrombus. Normal compressibility, respiratory phasicity and response to augmentation. Calf Veins: The RIGHT posterior tibial vein and RIGHT peroneal vein are limited in visualization secondary to calf edema. Superficial Great Saphenous Vein: No evidence of thrombus. Normal compressibility. Venous Reflux:  None. Other Findings:  None. LEFT LOWER EXTREMITY Common Femoral Vein: No evidence of thrombus. Normal compressibility, respiratory phasicity and response to augmentation. Saphenofemoral Junction: No evidence of thrombus. Normal compressibility and flow on color Doppler imaging. Profunda Femoral Vein: No evidence of thrombus. Normal compressibility and flow on color Doppler imaging. Femoral Vein: No evidence of thrombus. Normal compressibility, respiratory phasicity and response to augmentation. Popliteal Vein: No evidence of thrombus. Normal compressibility, respiratory phasicity and response to augmentation. Calf Veins: The LEFT posterior tibial vein and LEFT peroneal vein are limited in visualization secondary to calf edema. Superficial Great Saphenous Vein: No evidence of thrombus. Normal compressibility. Venous Reflux:   None. Other Findings:  None. IMPRESSION: Limited visualization of the BILATERAL posterior tibial veins and BILATERAL peroneal veins, without evidence of deep venous thrombosis in either lower extremity. Electronically Signed   By: Aram Candela M.D.   On: 05/19/2023 21:42   CT ABDOMEN PELVIS W CONTRAST  Result Date: 05/19/2023 CLINICAL DATA:  Positive D-dimer with shortness of breath. Mid abdominal pain and bloating. EXAM: CT ANGIOGRAPHY CHEST CT ABDOMEN AND PELVIS WITH CONTRAST TECHNIQUE: Multidetector CT imaging of the chest was performed using the standard protocol during bolus administration of intravenous contrast. Multiplanar CT image reconstructions and MIPs were obtained to evaluate the vascular anatomy. Multidetector CT imaging of the abdomen and pelvis was performed using the standard protocol during bolus administration of intravenous contrast. RADIATION DOSE REDUCTION: This exam was performed according to the departmental dose-optimization program which includes automated exposure control, adjustment of the mA and/or kV according to patient size and/or use of iterative reconstruction technique. CONTRAST:  OMNIPAQUE IOHEXOL 350 MG/ML SOLN COMPARISON:  CT abdomen and pelvis 04/16/2020.  PET-CT 08/30/2019. FINDINGS: CTA CHEST FINDINGS Cardiovascular: Satisfactory opacification of the pulmonary arteries to the segmental level. No evidence of pulmonary embolism. Normal heart size. No pericardial effusion. There are atherosclerotic calcifications of the aorta and coronary arteries. Mediastinum/Nodes: There is an enlarged subcarinal lymph node measuring 12 mm similar to prior. No new enlarged lymph nodes are seen. Visualized esophagus and thyroid gland are within normal limits. Lungs/Pleura: There is a calcified granuloma in the right upper lobe. The lungs are otherwise clear. There is no pleural effusion or pneumothorax. Musculoskeletal: No acute fracture or focal osseous lesion. Left  hemilaminectomy defects are seen at multiple lower thoracic levels, unchanged. Review of the MIP images confirms the above findings. CT ABDOMEN and PELVIS FINDINGS Hepatobiliary: The liver is diffusely heterogeneous with nodular liver contour compatible with cirrhosis. Gallstones are present. There is no biliary ductal dilatation. Pancreas: Unremarkable. No pancreatic ductal dilatation or surrounding inflammatory changes. Spleen: The spleen is mildly enlarged, unchanged. Adrenals/Urinary Tract: .Multiple punctate bilateral renal calculi are again noted. There is no hydronephrosis or perinephric fluid. There is a 9 mm calculus in the left renal pelvis. The adrenal glands and bladder are within normal limits. Stomach/Bowel: There is wall thickening of the ascending colon and hepatic flexure. No dilated bowel loops are seen. The appendix is within normal limits. The stomach is decompressed. Vascular/Lymphatic: Aorta and IVC are normal in size. There are atherosclerotic calcifications of the aorta. Portal venous system grossly patent. There are numerous nonenlarged and prominent retroperitoneal lymph nodes. There is an enlarged portacaval lymph node measuring 2 cm short axis, unchanged. Reproductive: Prostate is unremarkable. Other: There is a moderate to large amount of ascites throughout the abdomen and pelvis. There is no free intraperitoneal air. There is body wall edema diffusely. Musculoskeletal: No acute or significant osseous findings. Review of the MIP images confirms the above findings. IMPRESSION: 1. No evidence for pulmonary embolism. 2. Stable enlarged subcarinal lymph node. 3. Stable cirrhotic liver with splenomegaly and moderate to large amount of ascites. 4. Wall thickening of the ascending colon and hepatic flexure worrisome for colitis. 5. Cholelithiasis. 6. Nonobstructing bilateral renal calculi. 7. Body wall edema. 8. Aortic atherosclerosis. Aortic Atherosclerosis (ICD10-I70.0). Electronically Signed    By: Darliss Cheney M.D.   On: 05/19/2023 21:22   CT Angio Chest PE W and/or Wo Contrast Result Date: 05/19/2023 CLINICAL DATA:  Positive D-dimer with shortness of breath. Mid abdominal pain and bloating. EXAM: CT ANGIOGRAPHY CHEST CT ABDOMEN AND PELVIS WITH CONTRAST TECHNIQUE: Multidetector CT imaging of the chest was performed using the standard protocol during bolus administration of intravenous contrast. Multiplanar CT image reconstructions and MIPs were obtained to evaluate the vascular anatomy. Multidetector CT imaging of the abdomen and pelvis was performed using the standard  protocol during bolus administration of intravenous contrast. RADIATION DOSE REDUCTION: This exam was performed according to the departmental dose-optimization program which includes automated exposure control, adjustment of the mA and/or kV according to patient size and/or use of iterative reconstruction technique. CONTRAST:  OMNIPAQUE IOHEXOL 350 MG/ML SOLN COMPARISON:  CT abdomen and pelvis 04/16/2020.  PET-CT 08/30/2019. FINDINGS: CTA CHEST FINDINGS Cardiovascular: Satisfactory opacification of the pulmonary arteries to the segmental level. No evidence of pulmonary embolism. Normal heart size. No pericardial effusion. There are atherosclerotic calcifications of the aorta and coronary arteries. Mediastinum/Nodes: There is an enlarged subcarinal lymph node measuring 12 mm similar to prior. No new enlarged lymph nodes are seen. Visualized esophagus and thyroid gland are within normal limits. Lungs/Pleura: There is a calcified granuloma in the right upper lobe. The lungs are otherwise clear. There is no pleural effusion or pneumothorax. Musculoskeletal: No acute fracture or focal osseous lesion. Left hemilaminectomy defects are seen at multiple lower thoracic levels, unchanged. Review of the MIP images confirms the above findings. CT ABDOMEN and PELVIS FINDINGS Hepatobiliary: The liver is diffusely heterogeneous with nodular  liver contour compatible with cirrhosis. Gallstones are present. There is no biliary ductal dilatation. Pancreas: Unremarkable. No pancreatic ductal dilatation or surrounding inflammatory changes. Spleen: The spleen is mildly enlarged, unchanged. Adrenals/Urinary Tract: .Multiple punctate bilateral renal calculi are again noted. There is no hydronephrosis or perinephric fluid. There is a 9 mm calculus in the left renal pelvis. The adrenal glands and bladder are within normal limits. Stomach/Bowel: There is wall thickening of the ascending colon and hepatic flexure. No dilated bowel loops are seen. The appendix is within normal limits. The stomach is decompressed. Vascular/Lymphatic: Aorta and IVC are normal in size. There are atherosclerotic calcifications of the aorta. Portal venous system grossly patent. There are numerous nonenlarged and prominent retroperitoneal lymph nodes. There is an enlarged portacaval lymph node measuring 2 cm short axis, unchanged. Reproductive: Prostate is unremarkable. Other: There is a moderate to large amount of ascites throughout the abdomen and pelvis. There is no free intraperitoneal air. There is body wall edema diffusely. Musculoskeletal: No acute or significant osseous findings. Review of the MIP images confirms the above findings. IMPRESSION: 1. No evidence for pulmonary embolism. 2. Stable enlarged subcarinal lymph node. 3. Stable cirrhotic liver with splenomegaly and moderate to large amount of ascites. 4. Wall thickening of the ascending colon and hepatic flexure worrisome for colitis. 5. Cholelithiasis. 6. Nonobstructing bilateral renal calculi. 7. Body wall edema. 8. Aortic atherosclerosis. Aortic Atherosclerosis (ICD10-I70.0). Electronically Signed   By: Darliss Cheney M.D.   On: 05/19/2023 21:22   DG Chest 2 View Result Date: 05/19/2023 CLINICAL DATA:  Shortness of breath.  Rash.  Rectal bleeding. EXAM: CHEST - 2 VIEW COMPARISON:  10/19/2022 FINDINGS: Shallow  inspiration. Heart size and pulmonary vascularity are normal. Mild atelectasis in the lung bases. No airspace disease or consolidation. No pleural effusion or pneumothorax. Mediastinal contours appear intact. Calcification of the aorta. Degenerative changes in the spine. IMPRESSION: Shallow inspiration with atelectasis in the lung bases. Electronically Signed   By: Burman Nieves M.D.   On: 05/19/2023 20:13     The results of significant diagnostics from this hospitalization (including imaging, microbiology, ancillary and laboratory) are listed below for reference.     Microbiology: Recent Results (from the past 240 hours)  Resp panel by RT-PCR (RSV, Flu A&B, Covid) Anterior Nasal Swab     Status: None   Collection Time: 05/19/23  7:52 PM   Specimen:  Anterior Nasal Swab  Result Value Ref Range Status   SARS Coronavirus 2 by RT PCR NEGATIVE NEGATIVE Final    Comment: (NOTE) SARS-CoV-2 target nucleic acids are NOT DETECTED.  The SARS-CoV-2 RNA is generally detectable in upper respiratory specimens during the acute phase of infection. The lowest concentration of SARS-CoV-2 viral copies this assay can detect is 138 copies/mL. A negative result does not preclude SARS-Cov-2 infection and should not be used as the sole basis for treatment or other patient management decisions. A negative result may occur with  improper specimen collection/handling, submission of specimen other than nasopharyngeal swab, presence of viral mutation(s) within the areas targeted by this assay, and inadequate number of viral copies(<138 copies/mL). A negative result must be combined with clinical observations, patient history, and epidemiological information. The expected result is Negative.  Fact Sheet for Patients:  BloggerCourse.com  Fact Sheet for Healthcare Providers:  SeriousBroker.it  This test is no t yet approved or cleared by the [place] FDA  and  has been authorized for detection and/or diagnosis of SARS-CoV-2 by FDA under an Emergency Use Authorization (EUA). This EUA will remain  in effect (meaning this test can be used) for the duration of the COVID-19 declaration under Section 564(b)(1) of the Act, 21 U.S.C.section 360bbb-3(b)(1), unless the authorization is terminated  or revoked sooner.       Influenza A by PCR NEGATIVE NEGATIVE Final   Influenza B by PCR NEGATIVE NEGATIVE Final    Comment: (NOTE) The Xpert Xpress SARS-CoV-2/FLU/RSV plus assay is intended as an aid in the diagnosis of influenza from Nasopharyngeal swab specimens and should not be used as a sole basis for treatment. Nasal washings and aspirates are unacceptable for Xpert Xpress SARS-CoV-2/FLU/RSV testing.  Fact Sheet for Patients: BloggerCourse.com  Fact Sheet for Healthcare Providers: SeriousBroker.it  This test is not yet approved or cleared by the [place] FDA and has been authorized for detection and/or diagnosis of SARS-CoV-2 by FDA under an Emergency Use Authorization (EUA). This EUA will remain in effect (meaning this test can be used) for the duration of the COVID-19 declaration under Section 564(b)(1) of the Act, 21 U.S.C. section 360bbb-3(b)(1), unless the authorization is terminated or revoked.     Resp Syncytial Virus by PCR NEGATIVE NEGATIVE Final    Comment: (NOTE) Fact Sheet for Patients: BloggerCourse.com  Fact Sheet for Healthcare Providers: SeriousBroker.it  This test is not yet approved or cleared by the [place] FDA and has been authorized for detection and/or diagnosis of SARS-CoV-2 by FDA under an Emergency Use Authorization (EUA). This EUA will remain in effect (meaning this test can be used) for the duration of the COVID-19 declaration under Section 564(b)(1) of the Act, 21 U.S.C. section 360bbb-3(b)(1),  unless the authorization is terminated or revoked.  Performed at [facility], [address] [place], [place] [address]   Aerobic/Anaerobic Culture w Gram Stain (surgical/deep wound)     Status: None (Preliminary result)   Collection Time: 05/21/23 10:43 AM   Specimen: PATH Cytology Peritoneal fluid  Result Value Ref Range Status   Specimen Description   Final    PERITONEAL Performed at [facility], [address], [place], [place] [address]    Special Requests   Final    PERITONEAL Performed at [facility], [address], [place], [place] [address]    Gram Stain   Final    RARE WBC PRESENT, PREDOMINANTLY MONONUCLEAR NO ORGANISMS SEEN  Culture   Final    NO GROWTH < 24 HOURS Performed at [facility], [address] [place], [place] [address]    Report Status PENDING  Incomplete  Body fluid culture w Gram Stain     Status: None (Preliminary result)   Collection Time: 05/21/23 10:43 AM   Specimen: PATH Cytology Peritoneal fluid  Result Value Ref Range Status   Specimen Description   Final    PERITONEAL Performed at [facility], [address] [place], [place] [address]    Special Requests   Final    PERITONEAL Performed at [facility], [address], [place], [place] [address]    Gram Stain NO WBC SEEN NO ORGANISMS SEEN   Final   Culture   Final    NO GROWTH < 24 HOURS Performed at [facility], [address], [place], [place] [address]    Report Status PENDING  Incomplete     Labs: BNP (last 3 results) Recent Labs    05/19/23 1952  BNP 56.5   Basic Metabolic Panel: Recent Labs  Lab 05/19/23 1952 05/21/23 0356 05/22/23 0331  NA 137 138 136  K 3.7 3.5 3.6  CL 107 110 109  CO2 20* 21* 21*  GLUCOSE 98 85 78  BUN 13 14 15   CREATININE 1.44* 1.35* 1.46*  CALCIUM 8.2* 8.0* 7.6*  MG  --   --  2.0   Liver Function Tests: Recent Labs  Lab  05/19/23 1952 05/21/23 0356  AST 109* 80*  ALT 77* 58*  ALKPHOS 98 78  BILITOT 3.9* 3.6*  PROT 7.3 5.6*  ALBUMIN 2.6* 2.1*   Recent Labs  Lab 05/19/23 1952  LIPASE 43   Recent Labs  Lab 05/20/23 1401  AMMONIA 71*   CBC: Recent Labs  Lab 05/19/23 1952 05/21/23 0356 05/22/23 0331  WBC 5.9 5.4 4.6  NEUTROABS 4.1  --   --   HGB 13.0 11.1* 10.7*  HCT 38.3* 34.3* 31.9*  MCV 102.4* 106.2* 105.3*  PLT 97* 89* 87*   Cardiac Enzymes: No results for input(s): "CKTOTAL", "CKMB", "CKMBINDEX", "TROPONINI" in the last 168 hours. BNP: Invalid input(s): "POCBNP" CBG: No results for input(s): "GLUCAP" in the last 168 hours. D-Dimer Recent Labs    05/19/23 1952  DDIMER 3.08*   Hgb A1c No results for input(s): "HGBA1C" in the last 72 hours. Lipid Profile No results for input(s): "CHOL", "HDL", "LDLCALC", "TRIG", "CHOLHDL", "LDLDIRECT" in the last 72 hours. Thyroid function studies No results for input(s): "TSH", "T4TOTAL", "T3FREE", "THYROIDAB" in the last 72 hours.  Invalid input(s): "FREET3" Anemia work up Recent Labs    05/20/23 1550  FERRITIN 387*   Urinalysis    Component Value Date/Time   COLORURINE YELLOW 05/19/2023 2006   APPEARANCEUR CLEAR 05/19/2023 2006   LABSPEC 1.025 05/19/2023 2006   PHURINE 5.5 05/19/2023 2006   GLUCOSEU NEGATIVE 05/19/2023 2006   GLUCOSEU NEGATIVE 10/16/2006 1115   HGBUR MODERATE (A) 05/19/2023 2006   BILIRUBINUR SMALL (A) 05/19/2023 2006   KETONESUR NEGATIVE 05/19/2023 2006   PROTEINUR NEGATIVE 05/19/2023 2006   UROBILINOGEN 1.0 04/09/2009 1914   NITRITE NEGATIVE 05/19/2023 2006   LEUKOCYTESUR TRACE (A) 05/19/2023 2006   Sepsis Labs Recent Labs  Lab 05/19/23 1952 05/21/23 0356 05/22/23 0331  WBC 5.9 5.4 4.6   Microbiology Recent Results (from the past 240 hours)  Resp panel by RT-PCR (RSV, Flu A&B, Covid) Anterior Nasal Swab     Status: None  Collection Time: 05/19/23  7:52 PM   Specimen: Anterior Nasal Swab   Result Value Ref Range Status   SARS Coronavirus 2 by RT PCR NEGATIVE NEGATIVE Final    Comment: (NOTE) SARS-CoV-2 target nucleic acids are NOT DETECTED.  The SARS-CoV-2 RNA is generally detectable in upper respiratory specimens during the acute phase of infection. The lowest concentration of SARS-CoV-2 viral copies this assay can detect is 138 copies/mL. A negative result does not preclude SARS-Cov-2 infection and should not be used as the sole basis for treatment or other patient management decisions. A negative result may occur with  improper specimen collection/handling, submission of specimen other than nasopharyngeal swab, presence of viral mutation(s) within the areas targeted by this assay, and inadequate number of viral copies(<138 copies/mL). A negative result must be combined with clinical observations, patient history, and epidemiological information. The expected result is Negative.  Fact Sheet for Patients:  BloggerCourse.com  Fact Sheet for Healthcare Providers:  SeriousBroker.it  This test is no t yet approved or cleared by the [place] FDA and  has been authorized for detection and/or diagnosis of SARS-CoV-2 by FDA under an Emergency Use Authorization (EUA). This EUA will remain  in effect (meaning this test can be used) for the duration of the COVID-19 declaration under Section 564(b)(1) of the Act, 21 U.S.C.section 360bbb-3(b)(1), unless the authorization is terminated  or revoked sooner.       Influenza A by PCR NEGATIVE NEGATIVE Final   Influenza B by PCR NEGATIVE NEGATIVE Final    Comment: (NOTE) The Xpert Xpress SARS-CoV-2/FLU/RSV plus assay is intended as an aid in the diagnosis of influenza from Nasopharyngeal swab specimens and should not be used as a sole basis for treatment. Nasal washings and aspirates are unacceptable for Xpert Xpress SARS-CoV-2/FLU/RSV testing.  Fact Sheet for  Patients: BloggerCourse.com  Fact Sheet for Healthcare Providers: SeriousBroker.it  This test is not yet approved or cleared by the [place] FDA and has been authorized for detection and/or diagnosis of SARS-CoV-2 by FDA under an Emergency Use Authorization (EUA). This EUA will remain in effect (meaning this test can be used) for the duration of the COVID-19 declaration under Section 564(b)(1) of the Act, 21 U.S.C. section 360bbb-3(b)(1), unless the authorization is terminated or revoked.     Resp Syncytial Virus by PCR NEGATIVE NEGATIVE Final    Comment: (NOTE) Fact Sheet for Patients: BloggerCourse.com  Fact Sheet for Healthcare Providers: SeriousBroker.it  This test is not yet approved or cleared by the [place] FDA and has been authorized for detection and/or diagnosis of SARS-CoV-2 by FDA under an Emergency Use Authorization (EUA). This EUA will remain in effect (meaning this test can be used) for the duration of the COVID-19 declaration under Section 564(b)(1) of the Act, 21 U.S.C. section 360bbb-3(b)(1), unless the authorization is terminated or revoked.  Performed at [facility], [address] [place], [place] [address]   Aerobic/Anaerobic Culture w Gram Stain (surgical/deep wound)     Status: None (Preliminary result)   Collection Time: 05/21/23 10:43 AM   Specimen: PATH Cytology Peritoneal fluid  Result Value Ref Range Status   Specimen Description   Final    PERITONEAL Performed at [facility], [address], [place], [place] [address]    Special Requests   Final    PERITONEAL Performed at [facility], [address], [place], [place] [address]    Gram Stain   Final    RARE WBC  PRESENT, PREDOMINANTLY MONONUCLEAR NO ORGANISMS SEEN    Culture   Final    NO GROWTH < 24 HOURS Performed at [facility], [address] [place], [place] [address]    Report Status PENDING  Incomplete  Body fluid culture w Gram Stain     Status: None (Preliminary result)   Collection Time: 05/21/23 10:43 AM   Specimen: PATH Cytology Peritoneal fluid  Result Value Ref Range Status   Specimen Description   Final    PERITONEAL Performed at [facility], [address], [place], [place] [address]    Special Requests   Final    PERITONEAL Performed at [facility], [address] [place], [place] [address]    Gram Stain NO WBC SEEN NO ORGANISMS SEEN   Final   Culture   Final    NO GROWTH < 24 HOURS Performed at [facility], [address] [place], [place] [address]    Report Status PENDING  Incomplete     Time coordinating discharge:  I have spent 35 minutes face to face with the patient and on the ward discussing the patients care, assessment, plan and disposition with other care givers. >50% of the time was devoted counseling the patient about the risks and benefits of treatment/Discharge disposition and coordinating care.   SIGNED:   Miguel Rota, MD  [facility] 05/22/2023, 10:12 AM   If 7PM-7AM, please contact night-coverage

## 2023-05-22 NOTE — Plan of Care (Signed)
  Problem: Health Behavior/Discharge Planning: Goal: Ability to manage health-related needs will improve Outcome: Adequate for Discharge   Problem: Clinical Measurements: Goal: Ability to maintain clinical measurements within normal limits will improve Outcome: Adequate for Discharge Goal: Will remain free from infection Outcome: Adequate for Discharge Goal: Diagnostic test results will improve Outcome: Adequate for Discharge Goal: Respiratory complications will improve Outcome: Adequate for Discharge Goal: Cardiovascular complication will be avoided Outcome: Adequate for Discharge   Problem: Activity: Goal: Risk for activity intolerance will decrease Outcome: Adequate for Discharge   Problem: Nutrition: Goal: Adequate nutrition will be maintained Outcome: Adequate for Discharge   Problem: Coping: Goal: Level of anxiety will decrease Outcome: Adequate for Discharge   Problem: Elimination: Goal: Will not experience complications related to bowel motility Outcome: Adequate for Discharge Goal: Will not experience complications related to urinary retention Outcome: Adequate for Discharge   Problem: Pain Managment: Goal: General experience of comfort will improve and/or be controlled Outcome: Adequate for Discharge   Problem: Safety: Goal: Ability to remain free from injury will improve Outcome: Adequate for Discharge   Problem: Skin Integrity: Goal: Risk for impaired skin integrity will decrease Outcome: Adequate for Discharge

## 2023-05-22 NOTE — Progress Notes (Signed)
 Discharge meds delivered to the patient s bedside D Susann Givens RN

## 2023-05-24 LAB — BODY FLUID CULTURE W GRAM STAIN
Culture: NO GROWTH
Gram Stain: NONE SEEN

## 2023-05-24 LAB — TOTAL BILIRUBIN, BODY FLUID: Total bilirubin, fluid: 0.3 mg/dL

## 2023-05-24 LAB — LIPASE, FLUID: Lipase-Fluid: 14 U/L

## 2023-05-25 DIAGNOSIS — K7460 Unspecified cirrhosis of liver: Secondary | ICD-10-CM | POA: Diagnosis not present

## 2023-05-25 DIAGNOSIS — R188 Other ascites: Secondary | ICD-10-CM | POA: Diagnosis not present

## 2023-05-25 LAB — CYTOLOGY - NON PAP

## 2023-05-26 LAB — PH, BODY FLUID: pH, Body Fluid: 7.5

## 2023-05-27 LAB — AEROBIC/ANAEROBIC CULTURE W GRAM STAIN (SURGICAL/DEEP WOUND): Culture: NO GROWTH

## 2023-06-01 DIAGNOSIS — R188 Other ascites: Secondary | ICD-10-CM | POA: Diagnosis not present

## 2023-06-18 LAB — FUNGAL ORGANISM REFLEX

## 2023-06-18 LAB — FUNGUS CULTURE WITH STAIN

## 2023-06-18 LAB — FUNGUS CULTURE RESULT

## 2023-07-13 DIAGNOSIS — Z860100 Personal history of colon polyps, unspecified: Secondary | ICD-10-CM | POA: Diagnosis not present

## 2023-07-13 DIAGNOSIS — K625 Hemorrhage of anus and rectum: Secondary | ICD-10-CM | POA: Diagnosis not present

## 2023-07-13 DIAGNOSIS — R188 Other ascites: Secondary | ICD-10-CM | POA: Diagnosis not present

## 2023-07-13 DIAGNOSIS — K7460 Unspecified cirrhosis of liver: Secondary | ICD-10-CM | POA: Diagnosis not present

## 2023-07-17 ENCOUNTER — Ambulatory Visit: Admitting: Nurse Practitioner

## 2023-07-30 DIAGNOSIS — Z860100 Personal history of colon polyps, unspecified: Secondary | ICD-10-CM | POA: Diagnosis not present

## 2023-07-30 DIAGNOSIS — I8500 Esophageal varices without bleeding: Secondary | ICD-10-CM | POA: Diagnosis not present

## 2023-07-30 DIAGNOSIS — K259 Gastric ulcer, unspecified as acute or chronic, without hemorrhage or perforation: Secondary | ICD-10-CM | POA: Diagnosis not present

## 2023-07-30 DIAGNOSIS — K3189 Other diseases of stomach and duodenum: Secondary | ICD-10-CM | POA: Diagnosis not present

## 2023-07-30 DIAGNOSIS — Z1211 Encounter for screening for malignant neoplasm of colon: Secondary | ICD-10-CM | POA: Diagnosis not present

## 2023-07-30 DIAGNOSIS — K2980 Duodenitis without bleeding: Secondary | ICD-10-CM | POA: Diagnosis not present

## 2023-07-30 DIAGNOSIS — K766 Portal hypertension: Secondary | ICD-10-CM | POA: Diagnosis not present

## 2023-07-30 DIAGNOSIS — K562 Volvulus: Secondary | ICD-10-CM | POA: Diagnosis not present

## 2023-08-11 ENCOUNTER — Other Ambulatory Visit: Payer: Self-pay

## 2023-08-11 ENCOUNTER — Emergency Department (HOSPITAL_BASED_OUTPATIENT_CLINIC_OR_DEPARTMENT_OTHER): Admission: EM | Admit: 2023-08-11 | Discharge: 2023-08-11 | Disposition: A | Discharge status: Home / Self Care

## 2023-08-11 ENCOUNTER — Emergency Department (HOSPITAL_BASED_OUTPATIENT_CLINIC_OR_DEPARTMENT_OTHER)

## 2023-08-11 ENCOUNTER — Encounter (HOSPITAL_BASED_OUTPATIENT_CLINIC_OR_DEPARTMENT_OTHER): Payer: Self-pay

## 2023-08-11 DIAGNOSIS — R4182 Altered mental status, unspecified: Secondary | ICD-10-CM | POA: Diagnosis not present

## 2023-08-11 DIAGNOSIS — E7220 Disorder of urea cycle metabolism, unspecified: Secondary | ICD-10-CM | POA: Diagnosis not present

## 2023-08-11 DIAGNOSIS — R410 Disorientation, unspecified: Secondary | ICD-10-CM | POA: Diagnosis present

## 2023-08-11 LAB — URINALYSIS, ROUTINE W REFLEX MICROSCOPIC
Glucose, UA: NEGATIVE mg/dL
Ketones, ur: NEGATIVE mg/dL
Leukocytes,Ua: NEGATIVE
Nitrite: NEGATIVE
Protein, ur: NEGATIVE mg/dL
Specific Gravity, Urine: 1.02 (ref 1.005–1.030)
pH: 5.5 (ref 5.0–8.0)

## 2023-08-11 LAB — COMPREHENSIVE METABOLIC PANEL WITH GFR
ALT: 52 U/L — ABNORMAL HIGH (ref 0–44)
AST: 73 U/L — ABNORMAL HIGH (ref 15–41)
Albumin: 3 g/dL — ABNORMAL LOW (ref 3.5–5.0)
Alkaline Phosphatase: 98 U/L (ref 38–126)
Anion gap: 11 (ref 5–15)
BUN: 22 mg/dL — ABNORMAL HIGH (ref 6–20)
CO2: 21 mmol/L — ABNORMAL LOW (ref 22–32)
Calcium: 8.7 mg/dL — ABNORMAL LOW (ref 8.9–10.3)
Chloride: 107 mmol/L (ref 98–111)
Creatinine, Ser: 1.56 mg/dL — ABNORMAL HIGH (ref 0.61–1.24)
GFR, Estimated: 51 mL/min — ABNORMAL LOW (ref >60)
Glucose, Bld: 97 mg/dL (ref 70–99)
Potassium: 4 mmol/L (ref 3.5–5.1)
Sodium: 139 mmol/L (ref 135–145)
Total Bilirubin: 3.6 mg/dL — ABNORMAL HIGH (ref 0.0–1.2)
Total Protein: 6.6 g/dL (ref 6.5–8.1)

## 2023-08-11 LAB — CBC WITH DIFFERENTIAL/PLATELET
Abs Immature Granulocytes: 0.02 10*3/uL (ref 0.00–0.07)
Basophils Absolute: 0 10*3/uL (ref 0.0–0.1)
Basophils Relative: 0 %
Eosinophils Absolute: 0 10*3/uL (ref 0.0–0.5)
Eosinophils Relative: 0 %
HCT: 37.9 % — ABNORMAL LOW (ref 39.0–52.0)
Hemoglobin: 12.8 g/dL — ABNORMAL LOW (ref 13.0–17.0)
Immature Granulocytes: 0 %
Lymphocytes Relative: 22 %
Lymphs Abs: 1.5 10*3/uL (ref 0.7–4.0)
MCH: 33.5 pg (ref 26.0–34.0)
MCHC: 33.8 g/dL (ref 30.0–36.0)
MCV: 99.2 fL (ref 80.0–100.0)
Monocytes Absolute: 0.7 10*3/uL (ref 0.1–1.0)
Monocytes Relative: 10 %
Neutro Abs: 4.5 10*3/uL (ref 1.7–7.7)
Neutrophils Relative %: 68 %
Platelets: 90 10*3/uL — ABNORMAL LOW (ref 150–400)
RBC: 3.82 MIL/uL — ABNORMAL LOW (ref 4.22–5.81)
RDW: 14.3 % (ref 11.5–15.5)
WBC: 6.8 10*3/uL (ref 4.0–10.5)
nRBC: 0 % (ref 0.0–0.2)

## 2023-08-11 LAB — URINALYSIS, MICROSCOPIC (REFLEX): RBC / HPF: >50 RBC/hpf (ref 0–5)

## 2023-08-11 LAB — AMMONIA: Ammonia: 70 umol/L — ABNORMAL HIGH (ref 9–35)

## 2023-08-11 MED ORDER — LACTULOSE 10 GM/15ML PO SOLN
20.0000 g | Freq: Once | ORAL | Status: AC
  Administered 2023-08-11: 20 g via ORAL
  Filled 2023-08-11: qty 30

## 2023-08-11 MED ORDER — LACTATED RINGERS IV BOLUS
250.0000 mL | Freq: Once | INTRAVENOUS | Status: AC
  Administered 2023-08-11: 250 mL via INTRAVENOUS

## 2023-08-11 MED ORDER — LACTULOSE 10 GM/15ML PO SOLN: 20.0000 g | Freq: Two times a day (BID) | ORAL | 0 refills | Status: DC

## 2023-08-11 NOTE — ED Provider Notes (Signed)
  EMERGENCY DEPARTMENT AT [facility] Provider Note   CSN: [id number] Arrival date & time: 08/11/23  1247     Patient presents with: Altered Mental Status   James Downs is a 58 y.o. male.   58 year old male with cirrhosis presenting to the emergency department today with intermittent lightheadedness as well as some intermittent confusion.  The patient states that this has been going on now over the past few days.  He states that he has only been taking his Lasix  and carvedilol  and is stopped this over the past day or 2 to see if this would help with his symptoms.  He denies any blood in stool or dark stools.  Has had some intermittent confusion.  He is brought in today for further evaluation regarding this.  He denies any fevers.   Altered Mental Status Presenting symptoms: confusion   Associated symptoms: light-headedness        Prior to Admission medications   Medication Sig Start Date End Date Taking? Authorizing Provider  acetaminophen  (TYLENOL ) 500 MG tablet Take 1,500 mg by mouth at bedtime as needed for moderate pain (pain score 4-6).    [provider]  albuterol  (VENTOLIN  HFA) 108 (90 Base) MCG/ACT inhaler Inhale 1-2 puffs into the lungs every 6 (six) hours as needed for wheezing or shortness of breath. Patient not taking: Reported on 05/20/2023 02/22/20   Roberts Ching, MD  carvedilol  (COREG ) 3.125 MG tablet Take 1 tablet (3.125 mg total) by mouth daily in the afternoon. 05/22/23 06/21/23  Amin, Ankit C, MD  furosemide  (LASIX ) 40 MG tablet Take 1 tablet (40 mg total) by mouth daily. 05/26/23 05/25/24  Amin, Ankit C, MD  lactulose  (CHRONULAC ) 10 GM/15ML solution Take 30 mLs (20 g total) by mouth 2 (two) times daily. 08/11/23   Carin Charleston, MD  spironolactone  (ALDACTONE ) 100 MG tablet Take 1 tablet (100 mg total) by mouth daily. 05/22/23   Maggie Schooner, MD    Allergies: Other    Review of Systems  Neurological:  Positive for  light-headedness.  Psychiatric/Behavioral:  Positive for confusion.   All other systems reviewed and are negative.   Updated Vital Signs BP 137/75 (BP Location: Right Arm)   Pulse 70   Temp 98.8 F (37.1 C) (Oral)   Resp 18   Ht 5' 7 (1.702 m)   Wt 94.8 kg   SpO2 100%   BMI 32.73 kg/m   Physical Exam Vitals and nursing note reviewed.   Gen: NAD, chronically ill-appearing Eyes: PERRL, EOMI HEENT: no oropharyngeal swelling Neck: trachea midline Resp: clear to auscultation bilaterally Card: RRR, no murmurs, rubs, or gallops Abd: nontender, nondistended Extremities: no calf tenderness, no edema Vascular: 2+ radial pulses bilaterally, 2+ DP pulses bilaterally Neuro: Cranial nerve intact, equal strength and sensation throughout bilateral upper and lower extremities Skin: no rashes Psyc: acting appropriately   (all labs ordered are listed, but only abnormal results are displayed) Labs Reviewed  CBC WITH DIFFERENTIAL/PLATELET - Abnormal; Notable for the following components:      Result Value   RBC 3.82 (*)    Hemoglobin 12.8 (*)    HCT 37.9 (*)    Platelets 90 (*)    All other components within normal limits  COMPREHENSIVE METABOLIC PANEL WITH GFR - Abnormal; Notable for the following components:   CO2 21 (*)    BUN 22 (*)    Creatinine, Ser 1.56 (*)    Calcium 8.7 (*)    Albumin   3.0 (*)    AST 73 (*)    ALT 52 (*)    Total Bilirubin 3.6 (*)    GFR, Estimated 51 (*)    All other components within normal limits  AMMONIA - Abnormal; Notable for the following components:   Ammonia 70 (*)    All other components within normal limits  URINALYSIS, ROUTINE W REFLEX MICROSCOPIC    EKG: EKG Interpretation Date/Time:  Tuesday August 11 2023 13:19:58 EDT Ventricular Rate:  73 PR Interval:  154 QRS Duration:  102 QT Interval:  407 QTC Calculation: 449 R Axis:   37  Text Interpretation: Sinus rhythm Abnormal R-wave progression, early transition Borderline T  abnormalities, inferior leads Confirmed by Abner Hoffman [phone]) on 08/11/2023 1:35:25 PM  Radiology: No results found.   Procedures   Medications Ordered in the ED  lactulose  (CHRONULAC ) 10 GM/15ML solution 20 g (has no administration in time range)  lactated ringers  bolus 250 mL (250 mLs Intravenous New Bag/Given 08/11/23 1402)                                    Medical Decision Making 58 year old male with past medical history of cirrhosis presenting to the emergency department today with intermittent confusion and intermittent lightheadedness with some fluctuating blood pressures at home.  Will further evaluate the patient here with basic labs to evaluate for anemia or electrolyte abnormalities.  Will obtain an ammonia level and head CT as well as a urinalysis in regards to the altered mental status.  He is alert and oriented x 3 here today.  Suspect that the intermittent nature may be due to some hepatic encephalopathy as the patient is not currently on lactulose  and has been prescribed this in the past.  I will reevaluate for ultimate disposition.  Will give him a small fluid bolus here to see if this helps with his symptoms.  The patient CT scan and urinalysis are pending at time of signout.  Plan is for likely DC after with Rx for lactulose .  The patient's ammonia level is elevated here.  Will give him lactulose .  Amount and/or Complexity of Data Reviewed Labs: ordered. Radiology: ordered.  Risk Prescription drug management.        Final diagnoses:  Hyperammonemia [facility])    ED Discharge Orders          Ordered    lactulose  (CHRONULAC ) 10 GM/15ML solution  2 times daily        08/11/23 1504               Carin Charleston, MD 08/11/23 1505

## 2023-08-11 NOTE — ED Triage Notes (Signed)
 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 Was started on medications in February. Since Friday, has had fluctuations in BP. Has had confusion intermittently.

## 2023-08-11 NOTE — Discharge Instructions (Addendum)
 Your ammonia level was a little high today.  I think this may be causing the intermittent confusion.  Please take the lactulose  2-4 times a day as needed with the goal being to have 2-3 soft stools per day.  Please follow-up with your GI doctor and return to the ER for worsening symptoms.

## 2023-08-11 NOTE — ED Provider Notes (Signed)
 CT signout pending UA and CT scan and likely discharge home if negative.  See morning team's note for full HPI.  UA and CT scan reviewed no significant findings.  Patient feeling improved.  He is alert and oriented x 3.  Agree with Dr. Miranda America assessment that symptoms are likely secondary to elevated ammonia.  Discharged with lactulose  and GI follow-up as planned with TEE.   Rolinda Climes, DO 08/11/23 1550

## 2023-08-17 ENCOUNTER — Telehealth: Payer: Self-pay | Admitting: Gastroenterology

## 2023-08-17 NOTE — Telephone Encounter (Signed)
 Patient called and stated that he is wanting a second opinion from one of our doctors regarding his non alcoholic cirrhosis of the liver. Patient stated that he has not liked his care and treatment plan with Dr. Rollin. Patient also stated that he was diagnosed recently with H-pylori and was needing a dietician as well due to him being hospitalized for high ammonia levels due to him not being able to eat much under Dr. Curtis care. Please advise.

## 2023-10-22 ENCOUNTER — Telehealth: Payer: Self-pay | Admitting: *Deleted

## 2023-10-22 DIAGNOSIS — I10 Essential (primary) hypertension: Secondary | ICD-10-CM

## 2023-10-22 NOTE — Progress Notes (Signed)
 Complex Care Management Note Care Guide Note  10/22/2023 Name: AJANI RINEER MRN: [id number] DOB: [birth year]   Complex Care Management Outreach Attempts: An unsuccessful telephone outreach was attempted today to offer the patient information about available complex care management services.  Follow Up Plan:  Additional outreach attempts will be made to offer the patient complex care management information and services.   Encounter Outcome:  No Answer  Harlene Satterfield  [facility]  [facility], [facility] Guide  Direct Dial: [phone]  Fax [phone]

## 2023-10-28 NOTE — Progress Notes (Unsigned)
 Complex Care Management Note Care Guide Note  10/28/2023 Name: SILER MAVIS MRN: [id number] DOB: 1966/02/22   Complex Care Management Outreach Attempts: A second unsuccessful outreach was attempted today to offer the patient with information about available complex care management services.  Follow Up Plan:  Additional outreach attempts will be made to offer the patient complex care management information and services.   Encounter Outcome:  No Answer  Harlene Satterfield  [facility]  [facility], [facility] Guide  Direct Dial: [phone]  Fax [phone]

## 2023-10-29 NOTE — Progress Notes (Signed)
 Complex Care Management Note Care Guide Note  10/29/2023 Name: James Downs MRN: [id number] DOB: 23-Jun-1965   Complex Care Management Outreach Attempts: A third unsuccessful outreach was attempted today to offer the patient with information about available complex care management services.  Follow Up Plan:  No further outreach attempts will be made at this time. We have been unable to contact the patient to offer or enroll patient in complex care management services.  Encounter Outcome:  No Answer  Harlene Satterfield  [facility]  [facility], [facility] Guide  Direct Dial: [phone]  Fax [phone]

## 2023-11-23 ENCOUNTER — Emergency Department (HOSPITAL_COMMUNITY)

## 2023-11-23 ENCOUNTER — Inpatient Hospital Stay (HOSPITAL_COMMUNITY)
Admission: EM | Admit: 2023-11-23 | Discharge: 2023-12-11 | DRG: 432 | Disposition: A | Discharge status: Skilled Nursing Facility | Attending: Family Medicine | Admitting: Family Medicine

## 2023-11-23 ENCOUNTER — Inpatient Hospital Stay (HOSPITAL_COMMUNITY)

## 2023-11-23 ENCOUNTER — Other Ambulatory Visit: Payer: Self-pay

## 2023-11-23 DIAGNOSIS — K529 Noninfective gastroenteritis and colitis, unspecified: Secondary | ICD-10-CM | POA: Diagnosis present

## 2023-11-23 DIAGNOSIS — K652 Spontaneous bacterial peritonitis: Secondary | ICD-10-CM | POA: Diagnosis present

## 2023-11-23 DIAGNOSIS — D631 Anemia in chronic kidney disease: Secondary | ICD-10-CM | POA: Diagnosis present

## 2023-11-23 DIAGNOSIS — R17 Unspecified jaundice: Secondary | ICD-10-CM | POA: Diagnosis not present

## 2023-11-23 DIAGNOSIS — F17210 Nicotine dependence, cigarettes, uncomplicated: Secondary | ICD-10-CM | POA: Diagnosis present

## 2023-11-23 DIAGNOSIS — R933 Abnormal findings on diagnostic imaging of other parts of digestive tract: Secondary | ICD-10-CM | POA: Diagnosis not present

## 2023-11-23 DIAGNOSIS — K767 Hepatorenal syndrome: Secondary | ICD-10-CM | POA: Diagnosis present

## 2023-11-23 DIAGNOSIS — G479 Sleep disorder, unspecified: Secondary | ICD-10-CM | POA: Insufficient documentation

## 2023-11-23 DIAGNOSIS — K7682 Hepatic encephalopathy: Secondary | ICD-10-CM | POA: Diagnosis not present

## 2023-11-23 DIAGNOSIS — F32A Depression, unspecified: Secondary | ICD-10-CM | POA: Insufficient documentation

## 2023-11-23 DIAGNOSIS — Z8711 Personal history of peptic ulcer disease: Secondary | ICD-10-CM

## 2023-11-23 DIAGNOSIS — L871 Reactive perforating collagenosis: Secondary | ICD-10-CM | POA: Insufficient documentation

## 2023-11-23 DIAGNOSIS — Z5971 Insufficient health insurance coverage: Secondary | ICD-10-CM

## 2023-11-23 DIAGNOSIS — Z66 Do not resuscitate: Secondary | ICD-10-CM | POA: Diagnosis not present

## 2023-11-23 DIAGNOSIS — K259 Gastric ulcer, unspecified as acute or chronic, without hemorrhage or perforation: Secondary | ICD-10-CM | POA: Diagnosis not present

## 2023-11-23 DIAGNOSIS — I129 Hypertensive chronic kidney disease with stage 1 through stage 4 chronic kidney disease, or unspecified chronic kidney disease: Secondary | ICD-10-CM | POA: Diagnosis not present

## 2023-11-23 DIAGNOSIS — K8020 Calculus of gallbladder without cholecystitis without obstruction: Secondary | ICD-10-CM | POA: Diagnosis present

## 2023-11-23 DIAGNOSIS — Z79899 Other long term (current) drug therapy: Secondary | ICD-10-CM

## 2023-11-23 DIAGNOSIS — Z515 Encounter for palliative care: Secondary | ICD-10-CM

## 2023-11-23 DIAGNOSIS — R000 Tachycardia, unspecified: Secondary | ICD-10-CM | POA: Diagnosis not present

## 2023-11-23 DIAGNOSIS — F329 Major depressive disorder, single episode, unspecified: Secondary | ICD-10-CM | POA: Diagnosis not present

## 2023-11-23 DIAGNOSIS — E872 Acidosis, unspecified: Secondary | ICD-10-CM | POA: Diagnosis not present

## 2023-11-23 DIAGNOSIS — Z833 Family history of diabetes mellitus: Secondary | ICD-10-CM

## 2023-11-23 DIAGNOSIS — E1122 Type 2 diabetes mellitus with diabetic chronic kidney disease: Secondary | ICD-10-CM | POA: Diagnosis not present

## 2023-11-23 DIAGNOSIS — Z7189 Other specified counseling: Secondary | ICD-10-CM

## 2023-11-23 DIAGNOSIS — Z6829 Body mass index (BMI) 29.0-29.9, adult: Secondary | ICD-10-CM

## 2023-11-23 DIAGNOSIS — K766 Portal hypertension: Secondary | ICD-10-CM | POA: Diagnosis not present

## 2023-11-23 DIAGNOSIS — K7290 Hepatic failure, unspecified without coma: Secondary | ICD-10-CM | POA: Diagnosis not present

## 2023-11-23 DIAGNOSIS — E669 Obesity, unspecified: Secondary | ICD-10-CM | POA: Diagnosis present

## 2023-11-23 DIAGNOSIS — K921 Melena: Secondary | ICD-10-CM | POA: Insufficient documentation

## 2023-11-23 DIAGNOSIS — R0682 Tachypnea, not elsewhere classified: Secondary | ICD-10-CM | POA: Diagnosis not present

## 2023-11-23 DIAGNOSIS — R4182 Altered mental status, unspecified: Secondary | ICD-10-CM | POA: Diagnosis present

## 2023-11-23 DIAGNOSIS — Z1152 Encounter for screening for COVID-19: Secondary | ICD-10-CM

## 2023-11-23 DIAGNOSIS — Z7982 Long term (current) use of aspirin: Secondary | ICD-10-CM

## 2023-11-23 DIAGNOSIS — E119 Type 2 diabetes mellitus without complications: Secondary | ICD-10-CM

## 2023-11-23 DIAGNOSIS — N390 Urinary tract infection, site not specified: Secondary | ICD-10-CM | POA: Diagnosis not present

## 2023-11-23 DIAGNOSIS — K56609 Unspecified intestinal obstruction, unspecified as to partial versus complete obstruction: Secondary | ICD-10-CM | POA: Diagnosis present

## 2023-11-23 DIAGNOSIS — G9349 Other encephalopathy: Secondary | ICD-10-CM | POA: Diagnosis not present

## 2023-11-23 DIAGNOSIS — R188 Other ascites: Secondary | ICD-10-CM | POA: Diagnosis present

## 2023-11-23 DIAGNOSIS — N189 Chronic kidney disease, unspecified: Secondary | ICD-10-CM | POA: Diagnosis not present

## 2023-11-23 DIAGNOSIS — K649 Unspecified hemorrhoids: Secondary | ICD-10-CM | POA: Diagnosis present

## 2023-11-23 DIAGNOSIS — Z87442 Personal history of urinary calculi: Secondary | ICD-10-CM

## 2023-11-23 DIAGNOSIS — E7800 Pure hypercholesterolemia, unspecified: Secondary | ICD-10-CM | POA: Diagnosis present

## 2023-11-23 DIAGNOSIS — K7460 Unspecified cirrhosis of liver: Secondary | ICD-10-CM | POA: Diagnosis not present

## 2023-11-23 DIAGNOSIS — Z560 Unemployment, unspecified: Secondary | ICD-10-CM

## 2023-11-23 DIAGNOSIS — I8510 Secondary esophageal varices without bleeding: Secondary | ICD-10-CM | POA: Diagnosis not present

## 2023-11-23 DIAGNOSIS — R410 Disorientation, unspecified: Secondary | ICD-10-CM | POA: Diagnosis not present

## 2023-11-23 DIAGNOSIS — E871 Hypo-osmolality and hyponatremia: Secondary | ICD-10-CM | POA: Diagnosis not present

## 2023-11-23 DIAGNOSIS — E440 Moderate protein-calorie malnutrition: Secondary | ICD-10-CM | POA: Diagnosis present

## 2023-11-23 DIAGNOSIS — G4733 Obstructive sleep apnea (adult) (pediatric): Secondary | ICD-10-CM | POA: Diagnosis present

## 2023-11-23 DIAGNOSIS — M25562 Pain in left knee: Secondary | ICD-10-CM

## 2023-11-23 DIAGNOSIS — M6284 Sarcopenia: Secondary | ICD-10-CM | POA: Diagnosis present

## 2023-11-23 DIAGNOSIS — L4050 Arthropathic psoriasis, unspecified: Secondary | ICD-10-CM | POA: Diagnosis present

## 2023-11-23 DIAGNOSIS — Z789 Other specified health status: Secondary | ICD-10-CM

## 2023-11-23 DIAGNOSIS — N179 Acute kidney failure, unspecified: Secondary | ICD-10-CM | POA: Diagnosis not present

## 2023-11-23 DIAGNOSIS — R531 Weakness: Secondary | ICD-10-CM | POA: Diagnosis not present

## 2023-11-23 DIAGNOSIS — N200 Calculus of kidney: Secondary | ICD-10-CM | POA: Diagnosis present

## 2023-11-23 DIAGNOSIS — Z8249 Family history of ischemic heart disease and other diseases of the circulatory system: Secondary | ICD-10-CM

## 2023-11-23 DIAGNOSIS — E8770 Fluid overload, unspecified: Secondary | ICD-10-CM | POA: Diagnosis present

## 2023-11-23 DIAGNOSIS — Z91148 Patient's other noncompliance with medication regimen for other reason: Secondary | ICD-10-CM

## 2023-11-23 DIAGNOSIS — K7581 Nonalcoholic steatohepatitis (NASH): Secondary | ICD-10-CM | POA: Diagnosis present

## 2023-11-23 LAB — URINALYSIS, W/ REFLEX TO CULTURE (INFECTION SUSPECTED)
Glucose, UA: NEGATIVE mg/dL
Ketones, ur: NEGATIVE mg/dL
Nitrite: NEGATIVE
Protein, ur: 30 mg/dL — AB
Specific Gravity, Urine: 1.017 (ref 1.005–1.030)
WBC, UA: >50 WBC/hpf (ref 0–5)
pH: 5 (ref 5.0–8.0)

## 2023-11-23 LAB — BODY FLUID CELL COUNT WITH DIFFERENTIAL
Eos, Fluid: 0 %
Lymphs, Fluid: 45 %
Monocyte-Macrophage-Serous Fluid: 37 % — ABNORMAL LOW (ref 50–90)
Neutrophil Count, Fluid: 18 % (ref 0–25)
Total Nucleated Cell Count, Fluid: 29 uL (ref 0–1000)

## 2023-11-23 LAB — HEMOGLOBIN A1C
Hgb A1c MFr Bld: 3.7 % — ABNORMAL LOW (ref 4.8–5.6)
Mean Plasma Glucose: 59.49 mg/dL

## 2023-11-23 LAB — CBC WITH DIFFERENTIAL/PLATELET
Abs Immature Granulocytes: 0.11 K/uL — ABNORMAL HIGH (ref 0.00–0.07)
Basophils Absolute: 0 K/uL (ref 0.0–0.1)
Basophils Relative: 0 %
Eosinophils Absolute: 0 K/uL (ref 0.0–0.5)
Eosinophils Relative: 0 %
HCT: 37 % — ABNORMAL LOW (ref 39.0–52.0)
Hemoglobin: 12.2 g/dL — ABNORMAL LOW (ref 13.0–17.0)
Immature Granulocytes: 1 %
Lymphocytes Relative: 11 %
Lymphs Abs: 1.5 K/uL (ref 0.7–4.0)
MCH: 31.9 pg (ref 26.0–34.0)
MCHC: 33 g/dL (ref 30.0–36.0)
MCV: 96.6 fL (ref 80.0–100.0)
Monocytes Absolute: 1.1 K/uL — ABNORMAL HIGH (ref 0.1–1.0)
Monocytes Relative: 8 %
Neutro Abs: 10.3 K/uL — ABNORMAL HIGH (ref 1.7–7.7)
Neutrophils Relative %: 80 %
Platelets: 198 K/uL (ref 150–400)
RBC: 3.83 MIL/uL — ABNORMAL LOW (ref 4.22–5.81)
RDW: 16 % — ABNORMAL HIGH (ref 11.5–15.5)
WBC: 13 K/uL — ABNORMAL HIGH (ref 4.0–10.5)
nRBC: 0 % (ref 0.0–0.2)

## 2023-11-23 LAB — CBC
HCT: 31.4 % — ABNORMAL LOW (ref 39.0–52.0)
Hemoglobin: 10.7 g/dL — ABNORMAL LOW (ref 13.0–17.0)
MCH: 32.7 pg (ref 26.0–34.0)
MCHC: 34.1 g/dL (ref 30.0–36.0)
MCV: 96 fL (ref 80.0–100.0)
Platelets: 151 K/uL (ref 150–400)
RBC: 3.27 MIL/uL — ABNORMAL LOW (ref 4.22–5.81)
RDW: 15.9 % — ABNORMAL HIGH (ref 11.5–15.5)
WBC: 10.8 K/uL — ABNORMAL HIGH (ref 4.0–10.5)
nRBC: 0 % (ref 0.0–0.2)

## 2023-11-23 LAB — I-STAT CHEM 8, ED
BUN: 33 mg/dL — ABNORMAL HIGH (ref 6–20)
Calcium, Ion: 0.99 mmol/L — ABNORMAL LOW (ref 1.15–1.40)
Chloride: 106 mmol/L (ref 98–111)
Creatinine, Ser: 2.3 mg/dL — ABNORMAL HIGH (ref 0.61–1.24)
Glucose, Bld: 92 mg/dL (ref 70–99)
HCT: 36 % — ABNORMAL LOW (ref 39.0–52.0)
Hemoglobin: 12.2 g/dL — ABNORMAL LOW (ref 13.0–17.0)
Potassium: 4.2 mmol/L (ref 3.5–5.1)
Sodium: 137 mmol/L (ref 135–145)
TCO2: 20 mmol/L — ABNORMAL LOW (ref 22–32)

## 2023-11-23 LAB — I-STAT CG4 LACTIC ACID, ED
Lactic Acid, Venous: 3.8 mmol/L (ref 0.5–1.9)
Lactic Acid, Venous: 3.7 mmol/L (ref 0.5–1.9)
Lactic Acid, Venous: 3 mmol/L (ref 0.5–1.9)
Lactic Acid, Venous: 2.8 mmol/L (ref 0.5–1.9)

## 2023-11-23 LAB — PROTEIN, PLEURAL OR PERITONEAL FLUID: Total protein, fluid: <3 g/dL

## 2023-11-23 LAB — COMPREHENSIVE METABOLIC PANEL WITH GFR
ALT: 23 U/L (ref 0–44)
AST: 49 U/L — ABNORMAL HIGH (ref 15–41)
Albumin: 2.5 g/dL — ABNORMAL LOW (ref 3.5–5.0)
Alkaline Phosphatase: 59 U/L (ref 38–126)
Anion gap: 15 (ref 5–15)
BUN: 30 mg/dL — ABNORMAL HIGH (ref 6–20)
CO2: 18 mmol/L — ABNORMAL LOW (ref 22–32)
Calcium: 8.6 mg/dL — ABNORMAL LOW (ref 8.9–10.3)
Chloride: 102 mmol/L (ref 98–111)
Creatinine, Ser: 2.23 mg/dL — ABNORMAL HIGH (ref 0.61–1.24)
GFR, Estimated: 33 mL/min — ABNORMAL LOW (ref >60)
Glucose, Bld: 98 mg/dL (ref 70–99)
Potassium: 4.2 mmol/L (ref 3.5–5.1)
Sodium: 135 mmol/L (ref 135–145)
Total Bilirubin: 6.5 mg/dL — ABNORMAL HIGH (ref 0.0–1.2)
Total Protein: 7.2 g/dL (ref 6.5–8.1)

## 2023-11-23 LAB — RESP PANEL BY RT-PCR (RSV, FLU A&B, COVID)  RVPGX2
Influenza A by PCR: NEGATIVE
Influenza B by PCR: NEGATIVE
Resp Syncytial Virus by PCR: NEGATIVE
SARS Coronavirus 2 by RT PCR: NEGATIVE

## 2023-11-23 LAB — SALICYLATE LEVEL: Salicylate Lvl: <7 mg/dL — ABNORMAL LOW (ref 7.0–30.0)

## 2023-11-23 LAB — ETHANOL: Alcohol, Ethyl (B): <15 mg/dL (ref <15)

## 2023-11-23 LAB — POC OCCULT BLOOD, ED: Fecal Occult Bld: POSITIVE — AB

## 2023-11-23 LAB — LIPASE, BLOOD: Lipase: 41 U/L (ref 11–51)

## 2023-11-23 LAB — AMMONIA: Ammonia: 76 umol/L — ABNORMAL HIGH (ref 9–35)

## 2023-11-23 LAB — PROTIME-INR
INR: 1.4 — ABNORMAL HIGH (ref 0.8–1.2)
Prothrombin Time: 17.8 s — ABNORMAL HIGH (ref 11.4–15.2)

## 2023-11-23 LAB — ACETAMINOPHEN LEVEL: Acetaminophen (Tylenol), Serum: <10 ug/mL — ABNORMAL LOW (ref 10–30)

## 2023-11-23 MED ORDER — LIDOCAINE-EPINEPHRINE 1 %-1:100000 IJ SOLN
20.0000 mL | Freq: Once | INTRAMUSCULAR | Status: AC
  Administered 2023-11-23: 10 mL via INTRADERMAL

## 2023-11-23 MED ORDER — MIDODRINE HCL 5 MG PO TABS
7.5000 mg | ORAL_TABLET | Freq: Three times a day (TID) | ORAL | Status: DC
  Administered 2023-11-24 (×3): 7.5 mg via ORAL
  Filled 2023-11-23 (×3): qty 2

## 2023-11-23 MED ORDER — ACETAMINOPHEN 325 MG PO TABS
650.0000 mg | ORAL_TABLET | Freq: Four times a day (QID) | ORAL | Status: DC | PRN
  Administered 2023-11-25 – 2023-12-10 (×3): 650 mg via ORAL
  Filled 2023-11-23 (×5): qty 2

## 2023-11-23 MED ORDER — LACTULOSE ENEMA
300.0000 mL | ORAL | Status: DC
  Administered 2023-11-23 – 2023-11-24 (×5): 300 mL via RECTAL
  Filled 2023-11-23 (×6): qty 300

## 2023-11-23 MED ORDER — OCTREOTIDE ACETATE 100 MCG/ML IJ SOLN
200.0000 ug | Freq: Three times a day (TID) | INTRAMUSCULAR | Status: DC
  Administered 2023-11-23 – 2023-12-09 (×49): 200 ug via SUBCUTANEOUS
  Filled 2023-11-23 (×53): qty 2

## 2023-11-23 MED ORDER — IOHEXOL 350 MG/ML SOLN
75.0000 mL | Freq: Once | INTRAVENOUS | Status: AC | PRN
  Administered 2023-11-23: 75 mL via INTRAVENOUS

## 2023-11-23 MED ORDER — FUROSEMIDE 10 MG/ML IJ SOLN: 40.0000 mg | Freq: Every day | INTRAMUSCULAR | Status: DC

## 2023-11-23 MED ORDER — SODIUM CHLORIDE 0.9 % IV SOLN: 2.0000 g | INTRAVENOUS | Status: DC

## 2023-11-23 MED ORDER — LACTATED RINGERS IV BOLUS (SEPSIS)
1000.0000 mL | Freq: Once | INTRAVENOUS | Status: AC
  Administered 2023-11-23: 1000 mL via INTRAVENOUS

## 2023-11-23 MED ORDER — LACTATED RINGERS IV SOLN: INTRAVENOUS | Status: DC

## 2023-11-23 MED ORDER — SODIUM CHLORIDE 0.9 % IV SOLN
1.0000 g | Freq: Two times a day (BID) | INTRAVENOUS | Status: DC
  Administered 2023-11-23 – 2023-11-24 (×3): 1 g via INTRAVENOUS
  Filled 2023-11-23 (×3): qty 20

## 2023-11-23 MED ORDER — LACTULOSE ENEMA
300.0000 mL | Freq: Once | ORAL | Status: DC
  Filled 2023-11-23: qty 300

## 2023-11-23 MED ORDER — MIDAZOLAM HCL 2 MG/2ML IJ SOLN
2.0000 mg | Freq: Once | INTRAMUSCULAR | Status: AC
  Administered 2023-11-23: 2 mg via INTRAVENOUS
  Filled 2023-11-23: qty 2

## 2023-11-23 MED ORDER — ALBUMIN HUMAN 25 % IV SOLN
100.0000 g | Freq: Once | INTRAVENOUS | Status: AC
  Administered 2023-11-23: 100 g via INTRAVENOUS
  Filled 2023-11-23: qty 400

## 2023-11-23 MED ORDER — ALBUMIN HUMAN 25 % IV SOLN
40.0000 g | Freq: Every day | INTRAVENOUS | Status: DC
  Administered 2023-11-24 – 2023-11-25 (×2): 12.5 g via INTRAVENOUS
  Administered 2023-11-26 – 2023-11-30 (×5): 37.5 g via INTRAVENOUS
  Filled 2023-11-23 (×7): qty 150

## 2023-11-23 MED ORDER — MIDAZOLAM HCL 2 MG/2ML IJ SOLN
1.0000 mg | Freq: Once | INTRAMUSCULAR | Status: AC
  Administered 2023-11-23: 1 mg via INTRAVENOUS
  Filled 2023-11-23: qty 2

## 2023-11-23 MED ORDER — METRONIDAZOLE 500 MG/100ML IV SOLN
500.0000 mg | Freq: Once | INTRAVENOUS | Status: AC
  Administered 2023-11-23: 500 mg via INTRAVENOUS
  Filled 2023-11-23: qty 100

## 2023-11-23 MED ORDER — ACETAMINOPHEN 650 MG RE SUPP: 650.0000 mg | Freq: Four times a day (QID) | RECTAL | Status: DC | PRN

## 2023-11-23 MED ORDER — SODIUM CHLORIDE 0.9 % IV SOLN
2.0000 g | Freq: Once | INTRAVENOUS | Status: AC
  Administered 2023-11-23: 2 g via INTRAVENOUS
  Filled 2023-11-23: qty 12.5

## 2023-11-23 MED ORDER — LACTATED RINGERS IV BOLUS
500.0000 mL | Freq: Once | INTRAVENOUS | Status: AC
  Administered 2023-11-23: 500 mL via INTRAVENOUS

## 2023-11-23 MED ORDER — PANTOPRAZOLE SODIUM 40 MG IV SOLR
40.0000 mg | Freq: Two times a day (BID) | INTRAVENOUS | Status: DC
Start: 2023-11-23 — End: 2023-11-24
  Administered 2023-11-23 – 2023-11-24 (×3): 40 mg via INTRAVENOUS
  Filled 2023-11-23 (×3): qty 10

## 2023-11-23 NOTE — ED Notes (Signed)
 Pt returned from CT

## 2023-11-23 NOTE — ED Notes (Signed)
 James Downs (son) wants to be updated if anything changes. Phone # [phone]

## 2023-11-23 NOTE — Sepsis Progress Note (Signed)
 Elink monitoring for the code sepsis protocol.

## 2023-11-23 NOTE — ED Notes (Signed)
 Lactulose  not available at this time, from main pharm

## 2023-11-23 NOTE — ED Notes (Signed)
 Pt pended for paracentesis in IR. Will hold lactulose  until after procedure

## 2023-11-23 NOTE — ED Triage Notes (Signed)
 Pt BIB [facility] from home. AMS since waking up this am. Weakness, abd pain, and diarrhea for past week. Hx of cirrhosis of liver and kidney stones.  250 NS 20 LAC   VS EMS 140/80 120 HR 30 RR 104 CBG 96 2L

## 2023-11-23 NOTE — ED Notes (Signed)
 James Downs (daughter in law)-- contact info [phone]

## 2023-11-23 NOTE — ED Notes (Signed)
MD notified of rectal temp.

## 2023-11-23 NOTE — ED Notes (Signed)
 Awaiting verification for 1415 lactulose  admin

## 2023-11-23 NOTE — ED Notes (Signed)
 Got patient on the monitor into a gown did EKG shown to er provider

## 2023-11-23 NOTE — ED Provider Notes (Signed)
 [place] [facility] AT [facility] Provider Note   CSN: [id number] Arrival date & time: 11/23/23  9147  Patient did not participate during evaluation, HPI limited due to patient's mental status.  Patient presents with: Weakness and Altered Mental Status  James Downs is a 58 y.o. male with a history of non-alcoholic cirrhosis, HTN, HLD, and nephrolithiasis who was brought in by [facility] from home for evaluation of weakness, abdominal pain, and diarrhea for the past week. When asked where his pain is, patient states all over but does not elaborate further.  Per chart review, the patient has spironolactone , lactulose , carvedilol , and furosemide  on med list, however these prescriptions have not been filled in the past few months.  Unlikely that he is taking these.  Patient was previously admitted to the internal medicine service on 05/20/2023 for decompensated MASH cirrhosis. GI was consulted at that time who started carvedilol  and diuresed the patient. Patient had paracentesis on 05/21/2023 with 1.8 L removed.  There was no evidence of SBP at that time.   Talked with patient's son and daughter-in-law who state the patient has been out of it for the past few days. The patient told his daughter-in-law yesterday that he saw his son at their house but his son was not physically present. The patient was also weak and lying in bed for past few days. He lost his job 1 month ago so also lost his health insurance.   Prior to Admission medications   Medication Sig Start Date End Date Taking? Authorizing Provider  acetaminophen  (TYLENOL ) 500 MG tablet Take 1,500 mg by mouth at bedtime as needed for moderate pain (pain score 4-6).    [provider]  albuterol  (VENTOLIN  HFA) 108 (90 Base) MCG/ACT inhaler Inhale 1-2 puffs into the lungs every 6 (six) hours as needed for wheezing or shortness of breath. Patient not taking: Reported on 05/20/2023 02/22/20   Darra Fonda MATSU, MD   carvedilol  (COREG ) 3.125 MG tablet Take 1 tablet (3.125 mg total) by mouth daily in the afternoon. 05/22/23 06/21/23  Amin, Burgess BROCKS, MD  furosemide  (LASIX ) 40 MG tablet Take 1 tablet (40 mg total) by mouth daily. 05/26/23 05/25/24  Amin, Ankit C, MD  lactulose  (CHRONULAC ) 10 GM/15ML solution Take 30 mLs (20 g total) by mouth 2 (two) times daily. 08/11/23   Ula Prentice SAUNDERS, MD  spironolactone  (ALDACTONE ) 100 MG tablet Take 1 tablet (100 mg total) by mouth daily. 05/22/23   Caleen Burgess BROCKS, MD    Allergies: Other    Review of Systems  Reason unable to perform ROS: due to mental status.  Gastrointestinal:  Positive for abdominal distention and abdominal pain.  Neurological:  Positive for weakness.  Psychiatric/Behavioral:  Positive for confusion.     Updated Vital Signs BP 106/80   Pulse (!) 102   Temp 97.6 F (36.4 C) (Rectal)   Resp (!) 21   Ht 5' 7 (1.702 m)   Wt 81.6 kg   SpO2 100%   BMI 28.19 kg/m   Physical Exam Vitals reviewed.  Constitutional:      General: He is in acute distress.     Appearance: He is ill-appearing. He is not diaphoretic.  HENT:     Mouth/Throat:     Mouth: Mucous membranes are dry.  Eyes:     General: Scleral icterus present.  Cardiovascular:     Rate and Rhythm: Regular rhythm. Tachycardia present.  Abdominal:     General: There is distension (severely  distended).     Palpations: There is no mass.     Tenderness: There is no guarding or rebound.  Skin:    General: Skin is dry.     Coloration: Skin is jaundiced.     Findings: Rash (multiple erythematous papules noted to bilateral lower extremities overlying shins) present.  Neurological:     Mental Status: He is disoriented.     Comments: Patient is awake, able to answer yes/no questions but not oriented. Does not follow commands on exam. Frequent groaning with eyes closed.    (all labs ordered are listed, but only abnormal results are displayed) Labs Reviewed  COMPREHENSIVE METABOLIC PANEL WITH  GFR - Abnormal; Notable for the following components:      Result Value   CO2 18 (*)    BUN 30 (*)    Creatinine, Ser 2.23 (*)    Calcium 8.6 (*)    Albumin  2.5 (*)    AST 49 (*)    Total Bilirubin 6.5 (*)    GFR, Estimated 33 (*)    All other components within normal limits  CBC WITH DIFFERENTIAL/PLATELET - Abnormal; Notable for the following components:   WBC 13.0 (*)    RBC 3.83 (*)    Hemoglobin 12.2 (*)    HCT 37.0 (*)    RDW 16.0 (*)    Neutro Abs 10.3 (*)    Monocytes Absolute 1.1 (*)    Abs Immature Granulocytes 0.11 (*)    All other components within normal limits  PROTIME-INR - Abnormal; Notable for the following components:   Prothrombin Time 17.8 (*)    INR 1.4 (*)    All other components within normal limits  URINALYSIS, W/ REFLEX TO CULTURE (INFECTION SUSPECTED) - Abnormal; Notable for the following components:   Color, Urine AMBER (*)    APPearance CLOUDY (*)    Hgb urine dipstick MODERATE (*)    Bilirubin Urine SMALL (*)    Protein, ur 30 (*)    Leukocytes,Ua MODERATE (*)    Bacteria, UA FEW (*)    Non Squamous Epithelial 0-5 (*)    All other components within normal limits  I-STAT CG4 LACTIC ACID, ED - Abnormal; Notable for the following components:   Lactic Acid, Venous 3.8 (*)    All other components within normal limits  I-STAT CHEM 8, ED - Abnormal; Notable for the following components:   BUN 33 (*)    Creatinine, Ser 2.30 (*)    Calcium, Ion 0.99 (*)    TCO2 20 (*)    Hemoglobin 12.2 (*)    HCT 36.0 (*)    All other components within normal limits  I-STAT CG4 LACTIC ACID, ED - Abnormal; Notable for the following components:   Lactic Acid, Venous 3.7 (*)    All other components within normal limits  RESP PANEL BY RT-PCR (RSV, FLU A&B, COVID)  RVPGX2  CULTURE, BLOOD (ROUTINE X 2)  CULTURE, BLOOD (ROUTINE X 2)  URINE CULTURE  LIPASE, BLOOD  AMMONIA  CBC WITH DIFFERENTIAL/PLATELET    EKG: Sinus tachycardia without ST/T wave abnormalities;  significant artifact present EKG Interpretation Date/Time:  Monday November 23 2023 09:01:54 EDT Ventricular Rate:  111 PR Interval:  134 QRS Duration:  101 QT Interval:  345 QTC Calculation: 469 R Axis:   54  Text Interpretation: Sinus tachycardia Abnormal R-wave progression, early transition Borderline T wave abnormalities Confirmed by Pamella Sharper [phone]) on 11/23/2023 11:37:31 AM  Radiology: CT ABDOMEN PELVIS W CONTRAST Result Date:  11/23/2023 CLINICAL DATA:  58 year old male with jaundice, sepsis, cirrhosis. Altered mental status, weakness, abdominal pain. EXAM: CT ABDOMEN AND PELVIS WITH CONTRAST TECHNIQUE: Multidetector CT imaging of the abdomen and pelvis was performed using the standard protocol following bolus administration of intravenous contrast. RADIATION DOSE REDUCTION: This exam was performed according to the departmental dose-optimization program which includes automated exposure control, adjustment of the mA and/or kV according to patient size and/or use of iterative reconstruction technique. CONTRAST:  75mL OMNIPAQUE  IOHEXOL  350 MG/ML SOLN COMPARISON:  CT Abdomen and Pelvis 05/19/2023. FINDINGS: Lower chest: No pericardial or pleural effusion. Minor dependent lung base atelectasis. Hepatobiliary: Small, cirrhotic liver. Liver is highly nodular. No discrete mass. Contracted gallbladder with cholelithiasis. Pancreas: Negative. Spleen: Spleen size relatively normal, stable to mildly decreased in size since March. Adrenals/Urinary Tract: Extensive chronic nephrolithiasis. Kidneys appear nonobstructed despite the bilateral stones including in the left renal pelvis. Symmetric renal enhancement. But little to no renal contrast excretion on the delayed images (2 minute delay). Diminutive ureters. Negative adrenal glands. Diminutive urinary bladder, compressed from pelvic ascites and best seen on sagittal image 73. Stomach/Bowel: Large volume ascites, progressed since March. Circumferential  large bowel wall thickening intermittently noted, especially at the hepatic flexure, splenic flexure, descending colon. Relatively decompressed stomach, duodenum and small bowel. No pneumoperitoneum. Vascular/Lymphatic: Aortoiliac calcified atherosclerosis. Normal caliber abdominal aorta. Portal venous system and other major vascular structures in the abdomen and pelvis appear patent. No lymphadenopathy. Reproductive: Relatively large volume ascites tracking in both inguinal canals is new (series 3, image 100), simple fluid density. Other: Progressed pelvic ascites, large volume. Simple fluid density. Musculoskeletal: Previous left lower thoracic laminectomies, stable. Lumbar spine degeneration with stable mild spondylolisthesis. No acute osseous abnormality identified. IMPRESSION: 1. Cirrhosis with a Large volume of Ascites, progressed since March, and fluid newly distending the bilateral inguinal canals. 2. Little to no renal contrast excretion on the delayed images raising the possibility of renal insufficiency, hepatorenal syndrome. Underlying extensive nephrolithiasis but no evidence of obstructive uropathy. 3. Intermittent circumferential large bowel wall thickening, consider Portal Colopathy versus Acute Colitis. Bowel obstruction. 4. Cholelithiasis.  Aortic Atherosclerosis (ICD10-I70.0). Electronically Signed   By: VEAR Hurst M.D.   On: 11/23/2023 11:20   DG Chest Port 1 View Result Date: 11/23/2023 CLINICAL DATA:  Questionable sepsis - evaluate for abnormality EXAM: PORTABLE CHEST - 1 VIEW COMPARISON:  May 19, 2023 FINDINGS: Markedly low lung volumes. No focal airspace consolidation, pleural effusion, or pneumothorax. No cardiomegaly. Aortic atherosclerosis. No acute fracture or destructive lesions. Multilevel thoracic osteophytosis. IMPRESSION: No acute cardiopulmonary abnormality. Electronically Signed   By: Rogelia Myers M.D.   On: 11/23/2023 10:17    Medications Ordered in the ED  lactated  ringers  infusion ( Intravenous New Bag/Given 11/23/23 0925)  lactulose  (CHRONULAC ) enema 200 gm (has no administration in time range)  lactated ringers  bolus 1,000 mL (0 mLs Intravenous Stopped 11/23/23 1026)  ceFEPIme (MAXIPIME) 2 g in sodium chloride  0.9 % 100 mL IVPB (0 g Intravenous Stopped 11/23/23 1026)  metroNIDAZOLE (FLAGYL) IVPB 500 mg (0 mg Intravenous Stopped 11/23/23 1127)  midazolam  (VERSED ) injection 1 mg (1 mg Intravenous Given 11/23/23 1047)  iohexol  (OMNIPAQUE ) 350 MG/ML injection 75 mL (75 mLs Intravenous Contrast Given 11/23/23 1058)  lactated ringers  bolus 500 mL (500 mLs Intravenous New Bag/Given 11/23/23 1139)  midazolam  (VERSED ) injection 2 mg (2 mg Intravenous Given 11/23/23 1139)  lidocaine -EPINEPHrine (XYLOCAINE  W/EPI) 1 %-1:100000 (with pres) injection 20 mL (10 mLs Intradermal Given 11/23/23 1209)    Clinical  Course as of 11/23/23 1224  Mon Nov 23, 2023  9061 Okay to bypass renal function for CT [MP]  1130 CT abdomen pelvis shows large volume ascites.  Ordered IR paracentesis.  Patient has remained confused and intermittently agitated.  1 mg Versed  did not provide much relief of his agitation.  Will try 2 mg of Versed .  Will write for lactulose  enema given concern for hepatic encephalopathy.  Awaiting ammonia level.  UA concerning for UTI.  Repeat lactic acid 3.7 marginally downtrended from earlier.  Will give another IV fluid bolus and repeat lactic.  Remains normotensive at this time and do not want to volume overload with his history of large ascites [MP]  1144 Discussed with admitting hospitalist who accepts patient for admission. [MP]  1223 Discussed case with general surgery PA on-call who is aware of patient.  No need for emergent surgical intervention at this time [MP]    Clinical Course User Index [MP] Pamella Ozell LABOR, DO   Medical Decision Making Patient with his cirrhosis now presenting with AMS, abdominal pain, diarrhea for the past week.  On exam, the patient  is severely jaundiced with extensive abdominal distention.  He is not following commands.  He is tachycardic and tachypneic.  Given history and presentation, concern for sepsis, source unknown at this time. Likely intra-abdominal given significant distention on exam. Differential includes sepsis, spontaneous bacterial peritonitis, decompensated liver cirrhosis, hepatic encephalopathy, pancreatitis, partial SBO, gastroenteritis, subhepatic abscess, diverticulitis. ECG without ST/T wave abnormalities, low concern for ACS. Further workup to include CBC, CMP, INR, ammonia, lipase, blood cultures, i-STAT Chem-8, urinalysis, respiratory panel. Will activate sepsis protocol and start empiric antibiotics with cefepime + Flagyl as well as 1L LR bolus.  Will order CT abdomen pelvis with contrast for further evaluation of patient's abdomen.  9062 Lactic acid elevated at 3.8 with mild leukocytosis of 13.0. I-stat showing elevated Cr at 2.3, likely pre-renal in the setting of sepsis/hypoperfusion.  1127 Repeat lactic acid of 3.7 after fluids and initiation of empiric antibiotics.  The patient has been agitated and pulling at lines even despite 1 mg Versed .  CT abdomen pelvis showing cirrhosis with large volume ascites which has progressed since March.  Extensive nephrolithiasis but no obstructive uropathy.  There is also evidence of large bowel wall thickening which may represent colitis, however this was present on previous CT imaging in March.  The patient has cholelithiasis. CMP showing NAGMA and AKI. Ordering additional 2mg  of Versed  given patient's agitation. Will consult IR for diagnostic and therapeutic paracentesis. Ammonia is still pending, will order lactulose .  Given concern for sepsis and decompensated cirrhosis, will consult for unassigned admission.  1144 Discussed patient case with family medicine residents who agree to come evaluate the patient for admission. Given imaging findings of bowel  obstruction will also consult general surgery.  1223 Discussed case with general surgery PA. No need for surgical intervention at this time.   Final diagnoses:  Hepatic encephalopathy (HCC)  AKI (acute kidney injury)  Cirrhosis of liver with ascites, unspecified hepatic cirrhosis type [facility])    ED Discharge Orders     None         Waymond Cart, MD 11/23/23 1225    Pamella Ozell LABOR, DO 11/27/23 0018

## 2023-11-23 NOTE — ED Notes (Signed)
 Daughter Jassiel Flye [phone] would like to talk to the doctor immediately, she has questions

## 2023-11-23 NOTE — Assessment & Plan Note (Addendum)
 This is the leading diagnosis for bilateral lower extremity lesions, given presentation with BUN of 33, apparent chronic presentation, signs of excoriations.  Could also be related to uremic pruritus, or other infectious cause but less likely given sparing soles of feet and no apparent infectious prodrome. - Continue to monitor at this time and treating decompensated cirrhosis and hepatorenal syndrome

## 2023-11-23 NOTE — Procedures (Signed)
 PROCEDURE SUMMARY:  Successful image-guided paracentesis from the right lower abdomen.  Yielded 7.9 liters of hazy yellow fluid.  No immediate complications.  EBL: trace Patient tolerated well.   Specimen was sent for labs.  Please see imaging section of Epic for full dictation.  Kimble DEL Monta Police PA-C 11/23/2023 12:23 PM

## 2023-11-23 NOTE — ED Provider Notes (Signed)
 Okay to bypass labs for CT with IV contrast   Pamella Ozell LABOR, DO 11/23/23 9055

## 2023-11-23 NOTE — Consult Note (Addendum)
 Reason for Consult:HRS Referring Physician: Weakness and altered mental status  James Downs is an 58 y.o. male.   HPI:   James Downs is a 58 year old male with history of non-alcoholic cirrhosis, HTN, HLD, and nephrolithiasis, presenting with weakness, abdominal pain, diarrhea, and several days of altered mental status. History limited; wife reports medication non-adherence due to loss of insurance. No urinary symptoms.  Nephrology was consulted due to concern for hepatorenal syndrome from decompensated cirrhosis. Trend in Creatinine: Creatinine  Date/Time Value Ref Range Status  08/22/2019 12:10 PM 1.42 (H) 0.61 - 1.24 mg/dL Final   Creatinine, Ser  Date/Time Value Ref Range Status  11/23/2023 09:22 AM 2.30 (H) 0.61 - 1.24 mg/dL Final  90/70/7974 90:96 AM 2.23 (H) 0.61 - 1.24 mg/dL Final  93/82/7974 98:91 PM 1.56 (H) 0.61 - 1.24 mg/dL Final  96/71/7974 96:68 AM 1.46 (H) 0.61 - 1.24 mg/dL Final  96/72/7974 96:43 AM 1.35 (H) 0.61 - 1.24 mg/dL Final  96/74/7974 92:47 PM 1.44 (H) 0.61 - 1.24 mg/dL Final  91/74/7975 87:57 AM 1.34 (H) 0.61 - 1.24 mg/dL Final  92/75/7977 94:56 PM 1.26 (H) 0.61 - 1.24 mg/dL Final  97/78/7977 95:68 PM 1.39 (H) 0.61 - 1.24 mg/dL Final  97/84/7977 96:45 PM 1.19 0.61 - 1.24 mg/dL Final  93/77/7978 96:74 PM 1.47 (H) 0.61 - 1.24 mg/dL Final  93/84/7978 98:51 PM 1.51 (H) 0.61 - 1.24 mg/dL Final  87/91/7979 91:45 PM 1.51 (H) 0.61 - 1.24 mg/dL Final  93/77/7979 91:76 PM 1.58 (H) 0.61 - 1.24 mg/dL Final  95/80/7979 93:62 PM 2.08 (H) 0.61 - 1.24 mg/dL Final  89/94/7983 91:69 AM 1.20 0.61 - 1.24 mg/dL Final  89/93/7984 89:84 PM 1.22 0.50 - 1.35 mg/dL Final  89/94/7984 98:54 PM 1.36 (H) 0.50 - 1.35 mg/dL Final  90/88/7984 98:94 PM 1.57 (H) 0.76 - 1.27 mg/dL Final  97/85/7988 90:81 PM 1.48 0.4 - 1.5 mg/dL Final  98/85/7988 87:59 PM 1.26 0.4 - 1.5 mg/dL Final  90/69/7989 91:69 PM 1.47 0.4 - 1.5 mg/dL Final  87/74/7991 91:69 AM 1.32  Final   11/27/2006 10:20 AM 1.2 0.4 - 1.5 mg/dL Final  91/89/7991 98:51 AM 1.50  Final  09/04/2006 10:21 AM 1.4 0.4 - 1.5 mg/dL Final    PMH:   Past Medical History:  Diagnosis Date   Arthritis    Depression    Gout    Hypercholesterolemia    Hypertension    on meds   Kidney damage    Kidney stone    Obesity    Sleep apnea    uses CPAP    PSH:   Past Surgical History:  Procedure Laterality Date   FOOT SURGERY Right 1991   HAND SURGERY Right 1985   POSTERIOR LUMBAR FUSION 4 LEVEL N/A 11/29/2013   Procedure: Thoracic Eight, Thoracic Nine, Costotransversectomy   Thoracic Seven-Eight Thoracic Eight-Nine diskectomy;  Surgeon: Gerldine Maizes, MD;  Location: [facility];  Service: Neurosurgery;  Laterality: N/A;  Thoracic Eight, Thoracic Nine, Costotransversectomy   Thoracic Seven-Eight Thoracic Eight-Nine diskectomy    Allergies: No Known Allergies  Medications:   Prior to Admission medications   Medication Sig Start Date End Date Taking? Authorizing Provider  aspirin EC 81 MG tablet Take 81 mg by mouth daily. Swallow whole.   Yes [provider]  carvedilol  (COREG ) 6.25 MG tablet Take 6.25 mg by mouth 2 (two) times daily. Patient not taking: Reported on 11/23/2023    [provider]  furosemide  (LASIX ) 40 MG tablet Take 1  tablet (40 mg total) by mouth daily. Patient not taking: Reported on 11/23/2023 05/26/23 05/25/24  Caleen Burgess BROCKS, MD  lactulose  (CHRONULAC ) 10 GM/15ML solution Take 30 mLs (20 g total) by mouth 2 (two) times daily. Patient not taking: Reported on 11/23/2023 08/11/23   Ula Prentice SAUNDERS, MD  spironolactone  (ALDACTONE ) 100 MG tablet Take 1 tablet (100 mg total) by mouth daily. Patient not taking: Reported on 11/23/2023 05/22/23   Caleen Burgess BROCKS, MD    Inpatient medications:  lactulose   300 mL Rectal Q4H   pantoprazole (PROTONIX) IV  40 mg Intravenous Q12H    Discontinued Meds:   Medications Discontinued During This Encounter  Medication Reason    lactated ringers  infusion    albuterol  (VENTOLIN  HFA) 108 (90 Base) MCG/ACT inhaler Expired Prescription   carvedilol  (COREG ) 3.125 MG tablet Dose change   acetaminophen  (TYLENOL ) 500 MG tablet Patient has not taken in last 30 days   furosemide  (LASIX ) injection 40 mg    lactulose  (CHRONULAC ) enema 200 gm     Social History:  reports that he has been smoking cigarettes. He has a 30 pack-year smoking history. He has quit using smokeless tobacco. He reports that he does not currently use alcohol. He reports that he does not use drugs.  Family History:   Family History  Problem Relation Age of Onset   Cancer Father        BRAIN/PROSTATE/SKIN   Hypertension Father    Cancer Paternal Grandfather    Heart failure Maternal Grandmother    Diabetes Maternal Grandmother    Hypertension Maternal Grandmother    Hypertension Maternal Grandfather     Weight change:  No intake or output data in the 24 hours ending 11/23/23 1442 BP 92/81   Pulse (!) 109   Temp 97.6 F (36.4 C) (Rectal)   Resp 19   Ht 5' 7 (1.702 m)   Wt 81.6 kg   SpO2 100%   BMI 28.19 kg/m  Vitals:   11/23/23 1251 11/23/23 1310 11/23/23 1330 11/23/23 1345  BP: 116/77 103/71 (!) 117/97 92/81  Pulse:  (!) 101 (!) 107 (!) 109  Resp:  20 (!) 22 19  Temp:      TempSrc:      SpO2:  100% 100% 100%  Weight:      Height:       Physical exam Ill-appearing, lying in bed. Jaundiced skin RRR, no murmurs. Abdomen distended, nontender. Lethargic, somnolent unable to follow commands. Extremities without edema, noted hyperkeratotic rash on lower extremities  Labs: Pertinent vitals WBC 13.0, Hb 12.2 Lactic acid 3.7 > 3.0 SCr 2.33, GFR 33.  Previous baseline 1.3-1.5 UA moderate leukocyte, with moderate hemoglobinuria and small protein.   Basic Metabolic Panel: Recent Labs  Lab 11/23/23 0903 11/23/23 0922  NA 135 137  K 4.2 4.2  CL 102 106  CO2 18*  --   GLUCOSE 98 92  BUN 30* 33*  CREATININE 2.23* 2.30*   ALBUMIN  2.5*  --   CALCIUM 8.6*  --    Liver Function Tests: Recent Labs  Lab 11/23/23 0903  AST 49*  ALT 23  ALKPHOS 59  BILITOT 6.5*  PROT 7.2  ALBUMIN  2.5*   No results for input(s): LIPASE, AMYLASE in the last 168 hours. No results for input(s): AMMONIA in the last 168 hours. CBC: Recent Labs  Lab 11/23/23 0903 11/23/23 0922  WBC 13.0*  --   NEUTROABS 10.3*  --   HGB 12.2* 12.2*  HCT 37.0* 36.0*  MCV 96.6  --   PLT 198  --    PT/INR: @LABRCNTIP (inr:5) Cardiac Enzymes: )No results for input(s): CKTOTAL, CKMB, CKMBINDEX, TROPONINI in the last 168 hours. CBG: No results for input(s): GLUCAP in the last 168 hours.  Iron Studies: No results for input(s): IRON, TIBC, TRANSFERRIN, FERRITIN in the last 168 hours.  Xrays/Other Studies: CT ABDOMEN PELVIS W CONTRAST Result Date: 11/23/2023 CLINICAL DATA:  58 year old male with jaundice, sepsis, cirrhosis. Altered mental status, weakness, abdominal pain. EXAM: CT ABDOMEN AND PELVIS WITH CONTRAST TECHNIQUE: Multidetector CT imaging of the abdomen and pelvis was performed using the standard protocol following bolus administration of intravenous contrast. RADIATION DOSE REDUCTION: This exam was performed according to the departmental dose-optimization program which includes automated exposure control, adjustment of the mA and/or kV according to patient size and/or use of iterative reconstruction technique. CONTRAST:  75mL OMNIPAQUE  IOHEXOL  350 MG/ML SOLN COMPARISON:  CT Abdomen and Pelvis 05/19/2023. FINDINGS: Lower chest: No pericardial or pleural effusion. Minor dependent lung base atelectasis. Hepatobiliary: Small, cirrhotic liver. Liver is highly nodular. No discrete mass. Contracted gallbladder with cholelithiasis. Pancreas: Negative. Spleen: Spleen size relatively normal, stable to mildly decreased in size since March. Adrenals/Urinary Tract: Extensive chronic nephrolithiasis. Kidneys appear nonobstructed  despite the bilateral stones including in the left renal pelvis. Symmetric renal enhancement. But little to no renal contrast excretion on the delayed images (2 minute delay). Diminutive ureters. Negative adrenal glands. Diminutive urinary bladder, compressed from pelvic ascites and best seen on sagittal image 73. Stomach/Bowel: Large volume ascites, progressed since March. Circumferential large bowel wall thickening intermittently noted, especially at the hepatic flexure, splenic flexure, descending colon. Relatively decompressed stomach, duodenum and small bowel. No pneumoperitoneum. Vascular/Lymphatic: Aortoiliac calcified atherosclerosis. Normal caliber abdominal aorta. Portal venous system and other major vascular structures in the abdomen and pelvis appear patent. No lymphadenopathy. Reproductive: Relatively large volume ascites tracking in both inguinal canals is new (series 3, image 100), simple fluid density. Other: Progressed pelvic ascites, large volume. Simple fluid density. Musculoskeletal: Previous left lower thoracic laminectomies, stable. Lumbar spine degeneration with stable mild spondylolisthesis. No acute osseous abnormality identified. IMPRESSION: 1. Cirrhosis with a Large volume of Ascites, progressed since March, and fluid newly distending the bilateral inguinal canals. 2. Little to no renal contrast excretion on the delayed images raising the possibility of renal insufficiency, hepatorenal syndrome. Underlying extensive nephrolithiasis but no evidence of obstructive uropathy. 3. Intermittent circumferential large bowel wall thickening, consider Portal Colopathy versus Acute Colitis. Bowel obstruction. 4. Cholelithiasis.  Aortic Atherosclerosis (ICD10-I70.0). Electronically Signed   By: VEAR Hurst M.D.   On: 11/23/2023 11:20   DG Chest Port 1 View Result Date: 11/23/2023 CLINICAL DATA:  Questionable sepsis - evaluate for abnormality EXAM: PORTABLE CHEST - 1 VIEW COMPARISON:  May 19, 2023  FINDINGS: Markedly low lung volumes. No focal airspace consolidation, pleural effusion, or pneumothorax. No cardiomegaly. Aortic atherosclerosis. No acute fracture or destructive lesions. Multilevel thoracic osteophytosis. IMPRESSION: No acute cardiopulmonary abnormality. Electronically Signed   By: Rogelia Myers M.D.   On: 11/23/2023 10:17     Assessment/Plan: Acute Kidney Injury Concern for HRS  Patient with decompensated non alcoholic cirrhosis presenting with AMS and weakness, now with AKI (SCr 2.23 from baseline 1.3-1.5). Concern for hepatorenal syndrome, likely type II. Other causes of AKI excluded (no hypovolemia, no nephrotoxin exposure, CT A/P with large volume ascites, extensive non-obstructive nephrolithiasis). S/p 0.9 L paracentesis by IR. On broad coverage--initially ceftriaxone /Flagyl, transitioned to meropenem.  -Strict I's and O's, daily weight, BMP. -Avoid  nephrotoxic medicines;  - IV albumin  1 g/kilogram -Start vasoconstrictor therapy with midodrine plus octreotide.  Missy Sandhoff 11/23/2023, 2:42 PM

## 2023-11-23 NOTE — Plan of Care (Signed)
 Myself and Dr. Howell spoke with daughter, Tailor Westfall, alongside son, Danna Casella, and daughter-in-law via phone.  They had questions about patient's prognosis.  Explained to family that we cannot give an exact timeline for the course of his disease, either improvement or worsening, but we did explain that by our prognostic measures (Child-Pugh score and MELD score), the patient is very sick.  Explained to family that we are treating patient with IV medications to help improve blood pressure and get rid of toxins in the body.  Emphasized that we are unsure of the timeline for these medications to work.  Also explained that the patient's cirrhosis is not something that can be reversed.  Family wanted to know nuclear options for if things do not improve, and explained to family that if patient worsens overnight, he could end up in the ICU.  Also explained to family patient's DNR status, which they confirmed.  Family additionally wanted to know about possibility for liver transplant, which we informed them that this would be a discussion to be held outpatient with the patient's hepatology/gastroenterology team.  Family wanted to know about whether dialysis or further nephrology interventions would be needed, and explained to family that this is a question better answered by the nephrology team.  Did explain that dialysis may or may not be an option for the patient depending on the progression of his disease.  Informed family that both gastroenterology and nephrology are following along during this patient's hospitalization.  Relayed daytime team's rounding and group rounds schedule, and encouraged family to be present in the afternoon to have further discussions with the daytime team.  In total, spent 30 minutes answering all of family's current questions.

## 2023-11-23 NOTE — Assessment & Plan Note (Addendum)
 Last A1c 6.5 from 10 years ago, not documented in chart, no medication treatment. Possible diagnosis given no confirmatory A1c value since then. Glucose on CMP's normal. - Repeat A1c, will consider adding on CBGs and SSI pending result and patient's glucose on daily labs

## 2023-11-23 NOTE — ED Notes (Signed)
Awaiting albumin from pharmacy

## 2023-11-23 NOTE — Consult Note (Signed)
 Reason for Consult: Cirrhosis with ascites and hepatic encephalopathy. Referring Physician: Family practice teaching service.  James Downs is an 58 y.o. male.  HPI: James Downs is a 58 year old white male with multiple medical problems below with NASH cirrhosis who presented to the emergency room with a 3-day history of altered mental status with diarrhea and increasing abdominal distention. Patient is unable to give me any details on his history but according to his wife he has been out of his medications for the last 6 months since he lost his job. He has seen Dr. Belvie Just in the office and was prescribed diuretics and carvedilol  but according to his wife he has not taken these medications in about 6 months. About 8 L of ascitic fluid has been tapped by radiology.  On admission he was noted to have a lactic acid level of 3.8 with creatinine of 2.3 increased from a baseline of 1.5 in June of this year with INR 1.4 and more than 50 WBC ulcer on the UA CT of the abdomen pelvis revealed large volume ascites extensive nephrolithiasis and thickening of the bowel consistent with portal colopathy versus acute colitis.  Patient is unable to give any details on his history and seems incoherent and disoriented.  When the nurses were trying to give lactulose  enemas the patient started having rectal bleeding and therefore Flexi-Seal tube was inserted.  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    Depression    Gastric ulcer    Gout    Hypercholesterolemia    Hypertension    on meds   Kidney damage    Kidney stone    Obesity    Sleep apnea    uses CPAP   Past Surgical History:  Procedure Laterality Date   FOOT SURGERY Right 1991   HAND SURGERY Right 1985   POSTERIOR LUMBAR FUSION 4 LEVEL N/A 11/29/2013   Procedure: Thoracic Eight, Thoracic Nine, Costotransversectomy   Thoracic  Seven-Eight Thoracic Eight-Nine diskectomy;  Surgeon: Gerldine Maizes, MD;  Location: [facility];  Service: Neurosurgery;  Laterality: N/A;  Thoracic Eight, Thoracic Nine, Costotransversectomy   Thoracic Seven-Eight Thoracic Eight-Nine diskectomy   Family History  Problem Relation Age of Onset   Cancer Father        BRAIN/PROSTATE/SKIN   Hypertension Father    Cancer Paternal Grandfather    Heart failure Maternal Grandmother    Diabetes Maternal Grandmother    Hypertension Maternal Grandmother    Hypertension Maternal Grandfather    Social History:  reports that he has been smoking cigarettes. He has a 30 pack-year smoking history. He has quit using smokeless tobacco. He reports that he does not currently use alcohol. He reports that he does not use drugs.  Allergies: No Known Allergies  Medications: I have reviewed the patient's current medications. Prior to Admission: (Not in a hospital admission)  Scheduled:  lactulose   300 mL Rectal Q4H   pantoprazole (PROTONIX) IV  40 mg Intravenous Q12H   Continuous:  meropenem (MERREM) IV Stopped (11/23/23 1652)   PRN:acetaminophen  **OR** acetaminophen   Results for orders placed or performed during the hospital encounter of 11/23/23 (from the past 48 hours)  Comprehensive metabolic panel     Status: Abnormal   Collection Time: 11/23/23  9:03 AM  Result Value Ref Range   Sodium 135 135 - 145 mmol/L   Potassium 4.2 3.5 - 5.1 mmol/L  Chloride 102 98 - 111 mmol/L   CO2 18 (L) 22 - 32 mmol/L   Glucose, Bld 98 70 - 99 mg/dL    Comment: Glucose reference range applies only to samples taken after fasting for at least 8 hours.   BUN 30 (H) 6 - 20 mg/dL   Creatinine, Ser 7.76 (H) 0.61 - 1.24 mg/dL   Calcium 8.6 (L) 8.9 - 10.3 mg/dL   Total Protein 7.2 6.5 - 8.1 g/dL   Albumin  2.5 (L) 3.5 - 5.0 g/dL   AST 49 (H) 15 - 41 U/L   ALT 23 0 - 44 U/L   Alkaline Phosphatase 59 38 - 126 U/L   Total Bilirubin 6.5 (H) 0.0 - 1.2 mg/dL   GFR,  Estimated 33 (L) >60 mL/min    Comment: (NOTE) Calculated using the CKD-EPI Creatinine Equation (2021)    Anion gap 15 5 - 15    Comment: Performed at [facility], [address], [place], [place] [address]  CBC with Differential     Status: Abnormal   Collection Time: 11/23/23  9:03 AM  Result Value Ref Range   WBC 13.0 (H) 4.0 - 10.5 K/uL   RBC 3.83 (L) 4.22 - 5.81 MIL/uL   Hemoglobin 12.2 (L) 13.0 - 17.0 g/dL   HCT 62.9 (L) 60.9 - 47.9 %   MCV 96.6 80.0 - 100.0 fL   MCH 31.9 26.0 - 34.0 pg   MCHC 33.0 30.0 - 36.0 g/dL   RDW 83.9 (H) 88.4 - 84.4 %   Platelets 198 150 - 400 K/uL   nRBC 0.0 0.0 - 0.2 %   Neutrophils Relative % 80 %   Neutro Abs 10.3 (H) 1.7 - 7.7 K/uL   Lymphocytes Relative 11 %   Lymphs Abs 1.5 0.7 - 4.0 K/uL   Monocytes Relative 8 %   Monocytes Absolute 1.1 (H) 0.1 - 1.0 K/uL   Eosinophils Relative 0 %   Eosinophils Absolute 0.0 0.0 - 0.5 K/uL   Basophils Relative 0 %   Basophils Absolute 0.0 0.0 - 0.1 K/uL   Immature Granulocytes 1 %   Abs Immature Granulocytes 0.11 (H) 0.00 - 0.07 K/uL    Comment: Performed at [facility], [address] [place], [place] [address]  Protime-INR     Status: Abnormal   Collection Time: 11/23/23  9:03 AM  Result Value Ref Range   Prothrombin Time 17.8 (H) 11.4 - 15.2 seconds   INR 1.4 (H) 0.8 - 1.2    Comment: (NOTE) INR goal varies based on device and disease states. Performed at [facility], [address], [place], [place] [address]   Resp panel by RT-PCR (RSV, Flu A&B, Covid) Anterior Nasal Swab     Status: None   Collection Time: 11/23/23  9:04 AM   Specimen: Anterior Nasal Swab  Result Value Ref Range   SARS Coronavirus 2 by RT PCR NEGATIVE NEGATIVE   Influenza A by PCR NEGATIVE NEGATIVE   Influenza B by PCR NEGATIVE NEGATIVE    Comment: (NOTE) The Xpert Xpress SARS-CoV-2/FLU/RSV plus assay is intended as an aid in the diagnosis of influenza from Nasopharyngeal swab specimens and should  not be used as a sole basis for treatment. Nasal washings and aspirates are unacceptable for Xpert Xpress SARS-CoV-2/FLU/RSV testing.  Fact Sheet for Patients: BloggerCourse.com  Fact Sheet for Healthcare Providers: SeriousBroker.it  This test is not yet approved or cleared by the [place]  FDA and has been  authorized for detection and/or diagnosis of SARS-CoV-2 by FDA under an Emergency Use Authorization (EUA). This EUA will remain in effect (meaning this test can be used) for the duration of the COVID-19 declaration under Section 564(b)(1) of the Act, 21 U.S.C. section 360bbb-3(b)(1), unless the authorization is terminated or revoked.     Resp Syncytial Virus by PCR NEGATIVE NEGATIVE    Comment: (NOTE) Fact Sheet for Patients: BloggerCourse.com  Fact Sheet for Healthcare Providers: SeriousBroker.it  This test is not yet approved or cleared by the [place]  FDA and has been authorized for detection and/or diagnosis of SARS-CoV-2 by FDA under an Emergency Use Authorization (EUA). This EUA will remain in effect (meaning this test can be used) for the duration of the COVID-19 declaration under Section 564(b)(1) of the Act, 21 U.S.C. section 360bbb-3(b)(1), unless the authorization is terminated or revoked.  Performed at [facility], [address] [place], [place] [address]   Urinalysis, w/ Reflex to Culture (Infection Suspected) -Urine, Clean Catch     Status: Abnormal   Collection Time: 11/23/23  9:05 AM  Result Value Ref Range   Specimen Source URINE, CLEAN CATCH    Color, Urine AMBER (A) YELLOW    Comment: BIOCHEMICALS MAY BE AFFECTED BY COLOR   APPearance CLOUDY (A) CLEAR   Specific Gravity, Urine 1.017 1.005 - 1.030   pH 5.0 5.0 - 8.0   Glucose, UA NEGATIVE NEGATIVE mg/dL   Hgb urine dipstick MODERATE (A) NEGATIVE   Bilirubin Urine SMALL (A) NEGATIVE    Ketones, ur NEGATIVE NEGATIVE mg/dL   Protein, ur 30 (A) NEGATIVE mg/dL   Nitrite NEGATIVE NEGATIVE   Leukocytes,Ua MODERATE (A) NEGATIVE   RBC / HPF 21-50 0 - 5 RBC/hpf   WBC, UA >50 0 - 5 WBC/hpf    Comment:        Reflex urine culture not performed if WBC <=10, OR if Squamous epithelial cells >5. If Squamous epithelial cells >5 suggest recollection.    Bacteria, UA FEW (A) NONE SEEN   Squamous Epithelial / HPF 0-5 0 - 5 /HPF   WBC Clumps PRESENT    Mucus PRESENT    Granular Casts, UA PRESENT    Non Squamous Epithelial 0-5 (A) NONE SEEN    Comment: Performed at [facility], [address], [place], [place] [address]  I-Stat Lactic Acid, ED     Status: Abnormal   Collection Time: 11/23/23  9:22 AM  Result Value Ref Range   Lactic Acid, Venous 3.8 (HH) 0.5 - 1.9 mmol/L   Comment NOTIFIED PHYSICIAN   I-Stat Chem 8, ED     Status: Abnormal   Collection Time: 11/23/23  9:22 AM  Result Value Ref Range   Sodium 137 135 - 145 mmol/L   Potassium 4.2 3.5 - 5.1 mmol/L   Chloride 106 98 - 111 mmol/L   BUN 33 (H) 6 - 20 mg/dL   Creatinine, Ser 7.69 (H) 0.61 - 1.24 mg/dL   Glucose, Bld 92 70 - 99 mg/dL    Comment: Glucose reference range applies only to samples taken after fasting for at least 8 hours.   Calcium, Ion 0.99 (L) 1.15 - 1.40 mmol/L   TCO2 20 (L) 22 - 32 mmol/L   Hemoglobin 12.2 (L) 13.0 - 17.0 g/dL   HCT 63.9 (L) 60.9 - 47.9 %  Body fluid cell count with differential     Status: Abnormal   Collection Time: 11/23/23 10:19 AM  Result Value Ref  Range   Fluid Type-FCT PERITONEAL     Comment: ABDOMEN CORRECTED ON 09/29 AT 1332: PREVIOUSLY REPORTED AS ABDOMEN    Color, Fluid YELLOW (A) YELLOW   Appearance, Fluid CLEAR CLEAR   Total Nucleated Cell Count, Fluid 29 0 - 1,000 cu mm   Neutrophil Count, Fluid 18 0 - 25 %   Lymphs, Fluid 45 %   Monocyte-Macrophage-Serous Fluid 37 (L) 50 - 90 %   Eos, Fluid 0 %   Other Cells, Fluid OTHER CELLS IDENTIFIED AS  MESOTHELIAL CELLS %    Comment: Performed at [facility], [address], [place], [place] [address]  Protein, pleural or peritoneal fluid     Status: None   Collection Time: 11/23/23 10:19 AM  Result Value Ref Range   Total protein, fluid <3.0 g/dL    Comment: (NOTE) No normal range established for this test Results should be evaluated in conjunction with serum values    Fluid Type-FTP PERITONEAL     Comment: ABDOMEN Performed at [facility], [address], [place], [place] [address] CORRECTED ON 09/29 AT 1332: PREVIOUSLY REPORTED AS ABDOMEN   I-Stat Lactic Acid, ED     Status: Abnormal   Collection Time: 11/23/23 11:16 AM  Result Value Ref Range   Lactic Acid, Venous 3.7 (HH) 0.5 - 1.9 mmol/L   Comment NOTIFIED PHYSICIAN   I-Stat Lactic Acid     Status: Abnormal   Collection Time: 11/23/23  2:10 PM  Result Value Ref Range   Lactic Acid, Venous 3.0 (HH) 0.5 - 1.9 mmol/L   Comment NOTIFIED PHYSICIAN     CT ABDOMEN PELVIS W CONTRAST Result Date: 11/23/2023 CLINICAL DATA:  58 year old male with jaundice, sepsis, cirrhosis. Altered mental status, weakness, abdominal pain. EXAM: CT ABDOMEN AND PELVIS WITH CONTRAST TECHNIQUE: Multidetector CT imaging of the abdomen and pelvis was performed using the standard protocol following bolus administration of intravenous contrast. RADIATION DOSE REDUCTION: This exam was performed according to the departmental dose-optimization program which includes automated exposure control, adjustment of the mA and/or kV according to patient size and/or use of iterative reconstruction technique. CONTRAST:  75mL OMNIPAQUE  IOHEXOL  350 MG/ML SOLN COMPARISON:  CT Abdomen and Pelvis 05/19/2023. FINDINGS: Lower chest: No pericardial or pleural effusion. Minor dependent lung base atelectasis. Hepatobiliary: Small, cirrhotic liver. Liver is highly nodular. No discrete mass. Contracted gallbladder with cholelithiasis. Pancreas: Negative. Spleen: Spleen size  relatively normal, stable to mildly decreased in size since March. Adrenals/Urinary Tract: Extensive chronic nephrolithiasis. Kidneys appear nonobstructed despite the bilateral stones including in the left renal pelvis. Symmetric renal enhancement. But little to no renal contrast excretion on the delayed images (2 minute delay). Diminutive ureters. Negative adrenal glands. Diminutive urinary bladder, compressed from pelvic ascites and best seen on sagittal image 73. Stomach/Bowel: Large volume ascites, progressed since March. Circumferential large bowel wall thickening intermittently noted, especially at the hepatic flexure, splenic flexure, descending colon. Relatively decompressed stomach, duodenum and small bowel. No pneumoperitoneum. Vascular/Lymphatic: Aortoiliac calcified atherosclerosis. Normal caliber abdominal aorta. Portal venous system and other major vascular structures in the abdomen and pelvis appear patent. No lymphadenopathy. Reproductive: Relatively large volume ascites tracking in both inguinal canals is new (series 3, image 100), simple fluid density. Other: Progressed pelvic ascites, large volume. Simple fluid density. Musculoskeletal: Previous left lower thoracic laminectomies, stable. Lumbar spine degeneration with stable mild spondylolisthesis. No acute osseous abnormality identified. IMPRESSION: 1. Cirrhosis with a Large volume of Ascites, progressed since March, and fluid newly distending the bilateral  inguinal canals. 2. Little to no renal contrast excretion on the delayed images raising the possibility of renal insufficiency, hepatorenal syndrome. Underlying extensive nephrolithiasis but no evidence of obstructive uropathy. 3. Intermittent circumferential large bowel wall thickening, consider Portal Colopathy versus Acute Colitis. Bowel obstruction. 4. Cholelithiasis.  Aortic Atherosclerosis (ICD10-I70.0). Electronically Signed   By: VEAR Hurst M.D.   On: 11/23/2023 11:20   DG Chest Port 1  View Result Date: 11/23/2023 CLINICAL DATA:  Questionable sepsis - evaluate for abnormality EXAM: PORTABLE CHEST - 1 VIEW COMPARISON:  May 19, 2023 FINDINGS: Markedly low lung volumes. No focal airspace consolidation, pleural effusion, or pneumothorax. No cardiomegaly. Aortic atherosclerosis. No acute fracture or destructive lesions. Multilevel thoracic osteophytosis. IMPRESSION: No acute cardiopulmonary abnormality. Electronically Signed   By: Rogelia Myers M.D.   On: 11/23/2023 10:17   Review of Systems Blood pressure 112/72, pulse (!) 110, temperature 97.6 F (36.4 C), temperature source Oral, resp. rate (!) 28, height 5' 7 (1.702 m), weight 81.6 kg, SpO2 100%. Physical Exam Vitals reviewed.  Constitutional:      General: He is in acute distress.     Appearance: He is obese. He is ill-appearing and toxic-appearing. He is not diaphoretic.  HENT:     Head: Normocephalic and atraumatic.     Mouth/Throat:     Mouth: Mucous membranes are dry.  Eyes:     Extraocular Movements: Extraocular movements intact.     Pupils: Pupils are equal, round, and reactive to light.  Cardiovascular:     Rate and Rhythm: Normal rate and regular rhythm.     Pulses: Normal pulses.     Heart sounds: Normal heart sounds.  Pulmonary:     Effort: Pulmonary effort is normal.     Breath sounds: Normal breath sounds.  Abdominal:     General: There is distension.     Palpations: There is no mass.     Tenderness: There is abdominal tenderness. There is no guarding or rebound.     Hernia: No hernia is present.  Musculoskeletal:     Cervical back: Neck supple.  Neurological:     Mental Status: He is disoriented.   Assessment/Plan: 1) Acute decompensated nonalcoholic cirrhosis with possible hepatorenal syndrome versus acute kidney injury appreciate input from the nephrology service. Agree with Octreotide and midodrine along with albumin  as mentioned above. He is a Karolynn Morones C class with a MELD Na score is  20.7. 2) Hepatic encephalopathy on lactulose  enemas. As the patient is encephalopathic it is not possible to give him Rifaximin or lactulose  orally 3) Ascites s/p paracentesis on antibiotics. 4) Lactic acidosis. 5) Rectal bleeding with portal colopathy noted on CT scan. 6) Extensive nephrolithiasis without any evidence of obstructive uropathy 7) Skin rash over lower legs. 8) Contracted gallbladder with cholelithiasis 9) Limited code; patient does not desire intubation if need arises in the future [as per the patient's wife and son].  Renaye Sous 11/23/2023, 3:12 PM

## 2023-11-23 NOTE — ED Notes (Signed)
 MD at bedside.

## 2023-11-23 NOTE — Assessment & Plan Note (Addendum)
 Will continue below workup for cause though our leading differential is hepatic encephalopathy. - CT head without contrast - GI consulted, appreciate recommendations, continue lactulose  enemas given patient cannot take p.o. to target stools of 3 to 4/day - Status post 1 dose of cefepime and metronidazole in the ED, continue ceftriaxone  as below - BCx collected x 2, UCX collected and pending - EtOH, acetaminophen , salicylate, UDS - AM CBC, CMP

## 2023-11-23 NOTE — Plan of Care (Signed)
 Spoke again with PCCM who stated patient does not need ICU level care at this time but recommended reconsulting if patient's blood pressure continues to be soft after completion of albumin  infusion.  Greatly appreciate recommendations.

## 2023-11-23 NOTE — Assessment & Plan Note (Addendum)
 Tenuous, though stable, vital signs.  7.9 L of fluid removed by IR paracentesis, appreciate assistance.  CT abdomen pelvis with findings of fluid overload and HRS.  Patient unfortunately with GFR of 51 to 33 at this admission.  MELD score 28 with estimated mortality of 20% in 90 days.  CLIF-SOFA score of 10.  - Admit to FMTS with Dr. Donzetta, progressive unit, continuous telemetry and vitals per floor - Consulted nephrology for evaluation of worsening kidney function and hepatorenal syndrome, appreciate all their recommendations  - Will hold Lasix , Coreg , and spironolactone  for now given softer BP - Consulted GI, appreciate their recommendations, will likely need octreotide for blood pressure support and questionable bleeding esophageal varices - Start pantoprazole 40 mg IV BID, pending GI recs - Lactulose  as below - Received 1 dose of 2 g cefepime and metronidazole in ED, will start Meropenem 1g q8hours given elevated CLIF-SOFA score, may de-escalate to ceftriaxone  pending GI recommendations - Follow-up paracentesis studies - AM CMP, PT/INR - Will need TOC consult for medication assistance and PCP needs as well as PT/OT when more alert - Low threshold to contact PCCM if status worsens despite above interventions

## 2023-11-23 NOTE — ED Notes (Signed)
 Patient transported to IR

## 2023-11-23 NOTE — ED Notes (Signed)
 Admitting at bedside

## 2023-11-23 NOTE — ED Notes (Signed)
 [employer] operational officer requested to order rectal balloon catheter.

## 2023-11-23 NOTE — ED Notes (Signed)
 Pt transported to IR

## 2023-11-23 NOTE — Assessment & Plan Note (Addendum)
 Ongoing for at least 1 week with 4-8 black stools per day. Previous colonoscopy with tubular adenomas and hemorrhoids. Esophageal varices seen on endoscopy in March 2025, and with current decompensated state, feel these could be bleeding.  Likely start octreotide as above; hold off on beta-blockers given blood pressure.  Consulted general surgery in the ED about potential findings of bowel obstruction on CT abdomen pelvis, though they felt this was most likely due to his volume overload versus true obstruction. - Pending FOBT - GI consulted as above - PM CBC to monitor trend - Low threshold to reconsult general surgery if clinically with signs of bowel obstruction

## 2023-11-23 NOTE — ED Notes (Signed)
 Awaiting verification for 1415 lactulose  dose

## 2023-11-23 NOTE — Hospital Course (Addendum)
 James Downs is a 58 y.o. male with history of HLD, GAD, tobacco use, OSA, HTN who was admitted for decompensated liver cirrhosis with hepatorenal syndrome, esophageal varices, and hepatic encephalopathy.  Decompensated liver cirrhosis Hepatorenal syndrome Hepatic encephalopathy Presented in a tenuous state, hypotensive and tachycardic-however initially maintaining MAPs greater than 60 and ICU admission was not pursued.  MELD score of 28 (estimated 20% mortality in 90 days).  CLIF-SOFA score of 10. 8 L of fluid was removed via paracentesis, and patient was started on albumin  which did improve blood pressure. Patient was also started on antibiotics for SBP prophylaxis which were discontinued 10/2 since patient remained afebrile with no concern for SBP. Nephrology was consulted and recommended octreotide and midodrine for blood pressure support with goal MAP of 105-110. He was also started on Rifaximin 550 mg PO BID on 9/30. GI consulted, and recommended continuing lactulose . Repeat paracentesis with 8.3L removed due to continued discomfort. Palliative care was consulted and family declined comfort-based care at the time. A third paracentesis removed 4.8L. Octreotide was discontinued prior to discharge. [name] MAPs were in the 70s at time of discharge. [facility] was called this admission about patient's eligibility for liver transplant and potential transfer. Patient is not currently a candidate due to physical deconditioning and inconsistent follow-up.   AKI vs. CKD 1.81 on admission, increased to 2.25 with a peak at 2.32 on day of discharge. Previously 2.3 back in September. 2.0-2.3 appears to be new baseline. Patient also has become more oliguric during this admission. After discussion with [facility] about potential transfer, they believe patient may have component of CKD on top of hepatorenal syndrome.   Esophageal varices Black stool Reported at least 1 week of 4-8 black stools per day.  Known  esophageal varices, and hemorrhoids.  Started octreotide as above, held beta-blockers until blood pressure improved.  Additionally, general surgery was consulted for concern of bowel obstruction on CT abdomen pelvis-however after discussion, this was felt to be likely a finding related to volume overload versus true obstruction.    Reactive perforating collagenosis Extensive rash over bilateral lower extremities.  Rash spares palms and soles.This is a chronic presentation, with signs of excoriation.  Ultimately given chronic disease, thought to be reactive perforating collagenosis.  Major depressive disorder Concern that MDD may be a contributing factor to current presentation. Started Lexapro 5mg  daily during hospitalization. Will take 4-6 weeks for full effect and recommend outpatient follow up for medication management and CBT.  Other chronic conditions were medically managed with home medications and formulary alternatives as necessary (NASH, gastric ulcer, portal HTN, HLD, HTN, GAD, tobacco use, OSA, nephrolithiasis)  Follow-up recommendations: Recommend outpatient CBT Patient should not be Aspirin due to multiple bleeding sources  Consider increasing Lexapro 11/1 if still depressed, max dose 10mg  daily Needs to be able to complete 6 minute walk test and show consistent follow-up/adherence to be considered for transplant.

## 2023-11-23 NOTE — Plan of Care (Addendum)
 FMTS Brief Progress Note  S: Went to evaluate patient at bedside with Dr. Karin.  Patient remains encephalopathic.  Wife at bedside wife confirms that she spoke with GI, and that he would want DNR/DNI status.  We discussed extensively what DNR/DNI means, and wife claims this is what he wants.  O: BP 111/64   Pulse (!) 103   Temp 98.9 F (37.2 C)   Resp 16   Ht 5' 7 (1.702 m)   Wt 81.6 kg   SpO2 100%   BMI 28.19 kg/m    General: NAD, chronically ill-appearing, mumbling Cardio: Tachycardic Respiratory: CTAB, normal wob on RA GI: Ascites, fluid wave present, does not appear tender (but patient is encephalopathic).  A/P: Decompensated liver cirrhosis Encephalopathy -FMTS team was told at signout, that lactulose  was leaking around Flexi-Seal.  We evaluated Flexi-Seal, confirmed adequate filling of balloon and appropriate positioning.  Recommend continuing lactulose  enemas, if leakage continues to occur recommend irrigation with 45 mL sterile saline to dislodge any potential blockages.  If significantly which still occurs, consider replacement. -Additionally, unable to receive midodrine at this time given inability to p.o. from encephalopathy.  As above, will need to continue lactulose  enemas and monitor for improving encephalopathy.  His goal MAP per nephrology is 105-110. -We will monitor blood pressures closely overnight.  If he is consistently MAP less than 60, we will consult with CCM for consideration of ICU transfer - Orders reviewed. Labs for AM ordered, which was adjusted as needed.  - If condition changes, plan includes page for medicine teaching service.  CODE STATUS - CODE STATUS was changed at the request of spouse at bedside, stating he would not want the interventions of a full code.  Reviewed GI note from Dr. Kristie, signed at 7:02 PM which also stated patient wanted a limited code.  Although with further discussion with wife, she also does not want CPR measures in addition to  DO NOT INTUBATE.  They do want pressors/ICU admission if necessary.  Therefore, patient was changed to DNR limited.  They are aware that they can revoke/change CODE STATUS at any time.  Howell Lunger, DO 11/23/2023, 8:54 PM PGY-3, [facility] Night Resident  Please page [phone] with questions.

## 2023-11-23 NOTE — H&P (Addendum)
 Hospital Admission History and Physical Service Pager: [phone]  Patient name: James Downs Medical record number: [id number] Date of Birth: [birth year] Age: 58 y.o. Gender: male  Primary Care Provider: No PCP Consultants: GI, GS, IR, nephrology Code Status: FULL Preferred Emergency Contact:  Contact Information     Name Relation Home Work Mobile   [place] Spouse [phone]  [phone]      Other Contacts     Name Relation Home Work Mobile   Florido,Courtney Daughter   [phone]      Chief Complaint: Altered mental status  Differential and Medical Decision Making:  James Downs is a 58 y.o. male presenting with weakness and altered mental status.  Differential for this patient's presentation of this includes: Hepatic encephalopathy: Likely given AMS, history of decompensated cirrhosis, anasarca on exam, medication noncompliance Uremic encephalopathy: BUN 33, questionable leg lesions related to uremia, CKD, anasarca Complicated UTI: UA and microscopy consistent with infection, AMS, less likely given ascites and anasarca Sepsis, possibly secondary to UTI or SBP: Tachycardic, and tachypneic, AMS, with mild white count Intoxication: Acute onset persistent over the last 2 days, less likely low given reportedly no history of substance use from wife except for smoking cigarettes. CVA: Given acute change in mental status this morning, less likely given anasarca and history of cirrhosis. Assessment & Plan Decompensated cirrhosis (HCC) Hepatorenal syndrome (HCC) Esophageal varices in cirrhosis (HCC) Tenuous, though stable, vital signs.  7.9 L of fluid removed by IR paracentesis, appreciate assistance.  CT abdomen pelvis with findings of fluid overload and HRS.  Patient unfortunately with GFR of 51 to 33 at this admission.  MELD score 28 with estimated mortality of 20% in 90 days.  CLIF-SOFA score of 10.  - Admit to FMTS with Dr. Donzetta, progressive unit,  continuous telemetry and vitals per floor - Consulted nephrology for evaluation of worsening kidney function and hepatorenal syndrome, appreciate all their recommendations  - Will hold Lasix , Coreg , and spironolactone  for now given softer BP - Consulted GI, appreciate their recommendations, will likely need octreotide for blood pressure support and questionable bleeding esophageal varices - Start pantoprazole 40 mg IV BID, pending GI recs - Lactulose  as below - Received 1 dose of 2 g cefepime and metronidazole in ED, will start Meropenem 1g q8hours given elevated CLIF-SOFA score, may de-escalate to ceftriaxone  pending GI recommendations - Follow-up paracentesis studies - AM CMP, PT/INR - Will need TOC consult for medication assistance and PCP needs as well as PT/OT when more alert - Low threshold to contact PCCM if status worsens despite above interventions Altered mental state Will continue below workup for cause though our leading differential is hepatic encephalopathy. - CT head without contrast - GI consulted, appreciate recommendations, continue lactulose  enemas given patient cannot take p.o. to target stools of 3 to 4/day - Status post 1 dose of cefepime and metronidazole in the ED, continue ceftriaxone  as below - BCx collected x 2, UCX collected and pending - EtOH, acetaminophen , salicylate, UDS - AM CBC, CMP Black stool Ongoing for at least 1 week with 4-8 black stools per day. Previous colonoscopy with tubular adenomas and hemorrhoids. Esophageal varices seen on endoscopy in March 2025, and with current decompensated state, feel these could be bleeding.  Likely start octreotide as above; hold off on beta-blockers given blood pressure.  Consulted general surgery in the ED about potential findings of bowel obstruction on CT abdomen pelvis, though they felt this was most likely due to his volume overload versus  true obstruction. - Pending FOBT - GI consulted as above - PM CBC to monitor  trend - Low threshold to reconsult general surgery if clinically with signs of bowel obstruction Reactive perforating collagenosis This is the leading diagnosis for bilateral lower extremity lesions, given presentation with BUN of 33, apparent chronic presentation, signs of excoriations.  Could also be related to uremic pruritus, or other infectious cause but less likely given sparing soles of feet and no apparent infectious prodrome. - Continue to monitor at this time and treating decompensated cirrhosis and hepatorenal syndrome T2DM (type 2 diabetes mellitus) (HCC) Last A1c 6.5 from 10 years ago, not documented in chart, no medication treatment. Possible diagnosis given no confirmatory A1c value since then. Glucose on CMP's normal. - Repeat A1c, will consider adding on CBGs and SSI pending result and patient's glucose on daily labs  FEN/GI: NPO given altered mental status VTE Prophylaxis: SCDs for now given high bleed risk  Disposition: Progressive  History of Present Illness:  James Downs is a 58 y.o. male history of HLD, GAD, tobacco use, HTN, OSA, presenting with altered mental status and weakness. Patient is somnolent on exam and cannot participate in interview.  Started acting more altered 2 days ago after not feeling well. This has been worsening. He has been saying things like God save my house and other odd phrases. He also claimed he saw his mother in the house, but she has passed away. Seems more lethargic and less responsive. He has not improved much in the ED so far. No other sick symptoms.  He has also had pain in the groin and stomach. No nausea or vomiting. No fevers or sweating that wife knows of. He has more diarrhea recently, about 4 or more BM per day, for about a week now. No new foods. The stool looks like it has blood in it and is almost black in color. No tums or iron supplementation. He has had this before, and he was told it was due to his diet.  He has not  been taking any medications that he was discharged with back in March 2025 per wife.  In the ED, was confused, tachycardic, and tachypneic.  Mildly elevated white count with left shift, PT 17.8, INR 1.4, UA with moderate hemoglobin and leuks, few bacteria and greater than 50 WBC, BUN 33.  Lactic acid 3.8.  Started on a dose of cefepime 2 g in the morning and started sepsis workup.  CXR unremarkable, CTAP found large volume ascites, with possible hepatorenal syndrome and circumferential large bowel wall thickening.  Review Of Systems: Per HPI  Pertinent Past Medical History: HLD GAD Tobacco use OSA HTN  Possible T2DM, last A1c 10 years ago 6.5 (though only one value meeting DM criteria) Remainder reviewed in history tab.   Pertinent Past Surgical History: Posterior lumbar fusion Remainder reviewed in history tab.   Pertinent Social History: Tobacco use: 0.5 ppd cigarettes for at least 40 years, sometimes more cigarettes if agitated Alcohol use: Never Other Substance use: Never Lives with wife  Pertinent Family History: Father, brain/prostate cancer Paternal grandfather, colon cancer  Maternal grandmother, heart failure, diabetes, hypertension  Important Outpatient Medications: Albuterol  1 to 2 puffs every 6 hours as needed (not taking) Carvedilol  3.125 p.o. daily (not taking) Lasix  40 mg p.o. daily (not taking) Lactulose  20 g p.o. twice daily (not taking) Spironolactone  100 mg daily (not taking)  Objective: BP 112/72   Pulse (!) 110   Temp 97.6 F (36.4 C) (  Rectal)   Resp (!) 28   Ht 5' 7 (1.702 m)   Wt 81.6 kg   SpO2 100%   BMI 28.19 kg/m  Exam: General: Lethargic, lying in bed, intermittently groaning Eyes: PERRLA, pupils 3 mm bilaterally and reactive, some jaundice of medial sclera conjunctivae on the L ENTM: Could barely open mouth, difficult to examine Cardiovascular: Regular rate rhythm, no murmurs rubs or gallops, cap refill between 2 and 3 seconds of upper  and lower extremities Respiratory: Significant upper airway sounds, CTA bilaterally Gastrointestinal: Mild abdominal tenderness diffusely, no rebound or guarding, hypoactive bowel sounds.  Bandage over right flank from paracentesis. Derm: Diffuse raised hyperkeratotic papules on bilateral legs, some excoriations of lesions noted. Jaundice of hands and feet Neuro: A&O x 0, bilateral upper extremity strength 2 out of 5, grip strength 1 out of 5 bilaterally, though difficult to fully assess given patient's difficulty with following instructions, intermittently responding to very simple commands and questions Psych: Obtunded  Labs:  CBC BMET  Recent Labs  Lab 11/23/23 0903 11/23/23 0922  WBC 13.0*  --   HGB 12.2* 12.2*  HCT 37.0* 36.0*  PLT 198  --    Recent Labs  Lab 11/23/23 0903 11/23/23 0922  NA 135 137  K 4.2 4.2  CL 102 106  CO2 18*  --   BUN 30* 33*  CREATININE 2.23* 2.30*  GLUCOSE 98 92  CALCIUM 8.6*  --         Latest Ref Rng & Units 11/23/2023    9:03 AM 08/11/2023    1:08 PM 05/21/2023    3:56 AM  Hepatic Function  Total Protein 6.5 - 8.1 g/dL 7.2  6.6  5.6   Albumin  3.5 - 5.0 g/dL 2.5  3.0  2.1   AST 15 - 41 U/L 49  73  80   ALT 0 - 44 U/L 23  52  58   Alk Phosphatase 38 - 126 U/L 59  98  78   Total Bilirubin 0.0 - 1.2 mg/dL 6.5  3.6  3.6     Pertinent additional labs PT 17.8, INR 1.4, UA with moderate hemoglobin and leuks, few bacteria and greater than 50 WBC.  Lactic acid 3.8.   EKG: Sinus tachycardia, no acute ST-T abnormalities  Imaging Studies Performed:  CXR IMPRESSION: No acute cardiopulmonary abnormality.  CT abdomen and pelvis with contrast IMPRESSION: 1. Cirrhosis with a Large volume of Ascites, progressed since March, and fluid newly distending the bilateral inguinal canals.  2. Little to no renal contrast excretion on the delayed images raising the possibility of renal insufficiency, hepatorenal syndrome. Underlying extensive  nephrolithiasis but no evidence of obstructive uropathy.  3. Intermittent circumferential large bowel wall thickening, consider Portal Colopathy versus Acute Colitis. Bowel obstruction.  4. Cholelithiasis.  Aortic Atherosclerosis (ICD10-I70.0).  Lorrane Pac, MD 11/23/2023, 2:57 PM PGY-1, [facility]  FPTS Intern pager: [phone], text pages welcome Secure chat group [facility] Teaching Service   I agree with the assessment and plan as documented above.  Stuart Redo, MD PGY-3, [facility]

## 2023-11-23 NOTE — Progress Notes (Signed)
 MD team notified of bright red blood per rectum after flexiseal insertion. MD aware and will continue to monitor.

## 2023-11-24 DIAGNOSIS — K7290 Hepatic failure, unspecified without coma: Secondary | ICD-10-CM | POA: Diagnosis not present

## 2023-11-24 DIAGNOSIS — K652 Spontaneous bacterial peritonitis: Secondary | ICD-10-CM | POA: Insufficient documentation

## 2023-11-24 DIAGNOSIS — R933 Abnormal findings on diagnostic imaging of other parts of digestive tract: Secondary | ICD-10-CM | POA: Diagnosis not present

## 2023-11-24 DIAGNOSIS — K7682 Hepatic encephalopathy: Secondary | ICD-10-CM | POA: Diagnosis not present

## 2023-11-24 DIAGNOSIS — R188 Other ascites: Secondary | ICD-10-CM | POA: Diagnosis not present

## 2023-11-24 DIAGNOSIS — K7460 Unspecified cirrhosis of liver: Secondary | ICD-10-CM | POA: Diagnosis not present

## 2023-11-24 LAB — COMPREHENSIVE METABOLIC PANEL WITH GFR
ALT: 17 U/L (ref 0–44)
AST: 34 U/L (ref 15–41)
Albumin: 2.9 g/dL — ABNORMAL LOW (ref 3.5–5.0)
Alkaline Phosphatase: 41 U/L (ref 38–126)
Anion gap: 14 (ref 5–15)
BUN: 29 mg/dL — ABNORMAL HIGH (ref 6–20)
CO2: 18 mmol/L — ABNORMAL LOW (ref 22–32)
Calcium: 8.6 mg/dL — ABNORMAL LOW (ref 8.9–10.3)
Chloride: 106 mmol/L (ref 98–111)
Creatinine, Ser: 2.09 mg/dL — ABNORMAL HIGH (ref 0.61–1.24)
GFR, Estimated: 36 mL/min — ABNORMAL LOW (ref >60)
Glucose, Bld: 89 mg/dL (ref 70–99)
Potassium: 3.9 mmol/L (ref 3.5–5.1)
Sodium: 138 mmol/L (ref 135–145)
Total Bilirubin: 5 mg/dL — ABNORMAL HIGH (ref 0.0–1.2)
Total Protein: 5.9 g/dL — ABNORMAL LOW (ref 6.5–8.1)

## 2023-11-24 LAB — CBC
HCT: 28.1 % — ABNORMAL LOW (ref 39.0–52.0)
HCT: 28.8 % — ABNORMAL LOW (ref 39.0–52.0)
Hemoglobin: 9.5 g/dL — ABNORMAL LOW (ref 13.0–17.0)
Hemoglobin: 9.6 g/dL — ABNORMAL LOW (ref 13.0–17.0)
MCH: 32.9 pg (ref 26.0–34.0)
MCH: 32 pg (ref 26.0–34.0)
MCHC: 33.8 g/dL (ref 30.0–36.0)
MCHC: 33.3 g/dL (ref 30.0–36.0)
MCV: 97.2 fL (ref 80.0–100.0)
MCV: 96 fL (ref 80.0–100.0)
Platelets: 111 K/uL — ABNORMAL LOW (ref 150–400)
Platelets: 129 K/uL — ABNORMAL LOW (ref 150–400)
RBC: 2.89 MIL/uL — ABNORMAL LOW (ref 4.22–5.81)
RBC: 3 MIL/uL — ABNORMAL LOW (ref 4.22–5.81)
RDW: 16.2 % — ABNORMAL HIGH (ref 11.5–15.5)
RDW: 16.4 % — ABNORMAL HIGH (ref 11.5–15.5)
WBC: 8.6 K/uL (ref 4.0–10.5)
WBC: 9.9 K/uL (ref 4.0–10.5)
nRBC: 0 % (ref 0.0–0.2)
nRBC: 0 % (ref 0.0–0.2)

## 2023-11-24 LAB — LACTIC ACID, PLASMA: Lactic Acid, Venous: 1.7 mmol/L (ref 0.5–1.9)

## 2023-11-24 LAB — URINE CULTURE: Culture: NO GROWTH

## 2023-11-24 LAB — PATHOLOGIST SMEAR REVIEW

## 2023-11-24 LAB — PROTIME-INR
INR: 2 — ABNORMAL HIGH (ref 0.8–1.2)
Prothrombin Time: 24 s — ABNORMAL HIGH (ref 11.4–15.2)

## 2023-11-24 MED ORDER — LACTULOSE 10 GM/15ML PO SOLN
20.0000 g | Freq: Two times a day (BID) | ORAL | Status: DC
  Administered 2023-11-24 – 2023-11-25 (×3): 20 g via ORAL
  Filled 2023-11-24 (×3): qty 30

## 2023-11-24 MED ORDER — SODIUM CHLORIDE 0.9 % IV SOLN
2.0000 g | INTRAVENOUS | Status: DC
  Administered 2023-11-24 – 2023-11-25 (×2): 2 g via INTRAVENOUS
  Filled 2023-11-24 (×2): qty 20

## 2023-11-24 MED ORDER — MIDODRINE HCL 5 MG PO TABS: 2.5000 mg | ORAL_TABLET | Freq: Once | ORAL | Status: DC

## 2023-11-24 MED ORDER — MIDODRINE HCL 5 MG PO TABS
2.5000 mg | ORAL_TABLET | Freq: Once | ORAL | Status: AC
  Administered 2023-11-24: 2.5 mg via ORAL
  Filled 2023-11-24: qty 1

## 2023-11-24 MED ORDER — MIDODRINE HCL 5 MG PO TABS: 10.0000 mg | ORAL_TABLET | Freq: Three times a day (TID) | ORAL | Status: DC

## 2023-11-24 MED ORDER — PANTOPRAZOLE SODIUM 40 MG PO TBEC
40.0000 mg | DELAYED_RELEASE_TABLET | Freq: Two times a day (BID) | ORAL | Status: DC
  Administered 2023-11-24 – 2023-12-11 (×33): 40 mg via ORAL
  Filled 2023-11-24 (×6): qty 1
  Filled 2023-11-24: qty 2
  Filled 2023-11-24 (×15): qty 1
  Filled 2023-11-24: qty 2
  Filled 2023-11-24 (×11): qty 1

## 2023-11-24 MED ORDER — RIFAXIMIN 550 MG PO TABS
550.0000 mg | ORAL_TABLET | Freq: Two times a day (BID) | ORAL | Status: DC
  Administered 2023-11-24 – 2023-12-11 (×35): 550 mg via ORAL
  Filled 2023-11-24 (×35): qty 1

## 2023-11-24 MED ORDER — MIDODRINE HCL 5 MG PO TABS
15.0000 mg | ORAL_TABLET | Freq: Three times a day (TID) | ORAL | Status: DC
  Administered 2023-11-25 (×3): 15 mg via ORAL
  Filled 2023-11-24 (×3): qty 3

## 2023-11-24 NOTE — TOC Initial Note (Addendum)
 Transition of Care [facility]) - Initial/Assessment Note    Patient Details  Name: James Downs MRN: [id number] Date of Birth: [birth year]  Transition of [facility]) CM/SW Contact:    Andrez JULIANNA George, RN Phone Number: 11/24/2023, 2:57 PM  Clinical Narrative:                  Mr Trotta is a 58 yo M with a PMH of non-alcoholic cirrhosis, esophageal varices, gastric ulcer, portal HTN, HLD, nephrolithiasis who presents with 2-3 days of weakness, abdominal pain, and diarrhea.   Pt is from home with his spouse. They are together all the time.  Spouse or daughter in law assists with his medications. DIL or son provides needed transportation when the patient is unable to drive.  No PCP--CM has asked [facility] to pick up in their clinic.   CM consulted for medication assistance. Pt has [employer]. CM unable to assist with the cost of meds since he has insurance. Cm has updated the patients spouse.  IP Care management following.  Expected Discharge Plan: Home/Self Care Barriers to Discharge: Continued Medical Work up   Patient Goals and CMS Choice            Expected Discharge Plan and Services   Discharge Planning Services: CM Consult   Living arrangements for the past 2 months: Single Family Home                                      Prior Living Arrangements/Services Living arrangements for the past 2 months: Single Family Home Lives with:: Spouse Patient language and need for interpreter reviewed:: Yes Do you feel safe going back to the place where you live?: Yes        Care giver support system in place?: Yes (comment) Current home services: DME (cane/ walker/ shower seat/ BSC) Criminal Activity/Legal Involvement Pertinent to Current Situation/Hospitalization: No - Comment as needed  Activities of Daily Living      Permission Sought/Granted                  Emotional Assessment Appearance:: Appears older than stated age   Affect  (typically observed): Quiet Orientation: : Oriented to Self, Oriented to Place   Psych Involvement: No (comment)  Admission diagnosis:  Hepatic encephalopathy (HCC) [K76.82] AKI (acute kidney injury) [N17.9] Cirrhosis of liver with ascites, unspecified hepatic cirrhosis type (HCC) [K74.60, R18.8] Decompensated hepatic cirrhosis (HCC) [K72.90, K74.60] Patient Active Problem List   Diagnosis Date Noted   SBP (spontaneous bacterial peritonitis) (HCC) 11/24/2023   Altered mental state 11/23/2023   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   T2DM (type 2 diabetes mellitus) (HCC) 11/23/2023   Decompensated cirrhosis (HCC) 11/23/2023   Hepatorenal syndrome (HCC) 11/23/2023   Black stool 11/23/2023   Esophageal varices in cirrhosis (HCC) 11/23/2023   Anasarca 05/19/2023   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   Weakness of left leg 11/04/2013   Paresthesias 11/04/2013   Lumbar radiculopathy 11/04/2013   Degenerative disc disease, lumbar 11/04/2013   ONYCHOMYCOSIS 12/23/2006   HYPERLIPIDEMIA 12/23/2006   ANXIETY DISORDER, GENERALIZED 12/23/2006   TOBACCO ABUSE 12/23/2006   SYNDROME, CARPAL TUNNEL 12/23/2006   HYPERTENSION 12/23/2006   NEPHROLITHIASIS, HX OF 12/23/2006   PCP:  Patient, No Pcp Per Pharmacy:   [id number] [place], [place] - [address] AT [facility] OF [address] [place] [address] Phone: [phone] Fax: [phone]  [facility] #[id number] - [place], [place] - [address] AT [facility] OF [address] [place] [address] Phone: [phone] Fax: [phone]  [place] - [facility] [address] [place] [address] Phone: [phone] Fax: [phone]     Social Drivers of Health (SDOH) Social History: SDOH Screenings   Food Insecurity: No Food Insecurity (11/24/2023)  Housing: Unknown  (11/24/2023)  Transportation Needs: No Transportation Needs (11/24/2023)  Utilities: Not At Risk (11/24/2023)  Tobacco Use: High Risk (08/11/2023)   SDOH Interventions:     Readmission Risk Interventions     No data to display

## 2023-11-24 NOTE — Assessment & Plan Note (Signed)
 Improved mental status this morning. CT head without contrast shows No acute intracranial abnormality. He was initially started lactulose  enemas d/o NPO status now transition to PO. Received 1 dose of cefepime and metronidazole in the ED,  - Switch back from Meropenem to Rocephin    - BCx collected x 2, UCX collected and pending - UDS pending - AM CBC, CMP - continue Delirium precaution

## 2023-11-24 NOTE — Plan of Care (Signed)
 Spoke with his son Yanky, [place] at [phone] and his wife.  Son requested information to communicate through him. I updated his son about patient status including current diet and answered questions on treatments. Notified pt's son that he is clinically improving but we still keep an eyes on his HgB which will repeat this afternoon. I also mentioned patient may be appropriate to be d/c to SNF or rehab pending PT evaluation

## 2023-11-24 NOTE — Assessment & Plan Note (Signed)
 Hemodynamically stable. 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  CT abdomen pelvis with findings of fluid overload and HRS.  Patient unfortunately with GFR of 51 to 33 at this admission.  MELD score 28 with estimated mortality of 20% in 90 days.  CLIF-SOFA score of 10. His mental status is improving.  - GI consulted appreciated recommendations  Continue midodrine 7.5 mg TIDAC will plan on Titrate medication to meat goal MAP of 105-110 Continue Albumin  37.5 g Daily Hepatic encephalopathy continue lactulose  PO 20 mg BID.  Starting Rifaximin 550 mg PO BID Ascites s/p paracentesis on antibiotics.   - Nephrology consult for evaluation of worsening kidney function and hepatorenal syndrome, pending recommendations - Continue to hold Lasix , Coreg , and spironolactone  for now given softer BP - Continue octreotide 200 mcg SQ TID - AM CMP, PT/INR - PT/OT

## 2023-11-24 NOTE — Progress Notes (Signed)
 Daily Progress Note Intern Pager: [phone]  Patient name: James Downs Medical record number: [id number] Date of birth: April 07, 1965 Age: 58 y.o. Gender: male  Primary Care Provider: Patient, No Pcp Per Consultants: GI, GS, IR, nephrology  Code Status: Full  Pt Overview and Major Events to Date:  9/29 : Admitted   James Downs is a 58 yo M who presented with AMS d/t , and hepatic encephalopathy. Patient with a PMH of non-alcoholic cirrhosis(NASH), esophageal varices, gastric ulcer, portal HTN, HLD, nephrolithiasis who presents with 2-3 days of weakness, abdominal pain, and diarrhea.  Assessment & Plan Decompensated cirrhosis (HCC) Hepatorenal syndrome (HCC) Esophageal varices in cirrhosis (HCC) Hemodynamically stable. 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  CT abdomen pelvis with findings of fluid overload and HRS.  Patient unfortunately with GFR of 51 to 33 at this admission.  MELD score 28 with estimated mortality of 20% in 90 days.  CLIF-SOFA score of 10. His mental status is improving.  - GI consulted appreciated recommendations  Continue midodrine 7.5 mg TIDAC will plan on Titrate medication to meat goal MAP of 105-110 Continue Albumin  37.5 g Daily Hepatic encephalopathy continue lactulose  PO 20 mg BID.  Starting Rifaximin 550 mg PO BID Ascites s/p paracentesis on antibiotics.   - Nephrology consult for evaluation of worsening kidney function and hepatorenal syndrome, pending recommendations - Continue to hold Lasix , Coreg , and spironolactone  for now given softer BP - Continue octreotide 200 mcg SQ TID - AM CMP, PT/INR - PT/OT Altered mental state Improved mental status this morning. CT head without contrast shows No acute intracranial abnormality. He was initially started lactulose  enemas d/o NPO status now transition to PO. Received 1 dose of cefepime and metronidazole in the  ED,  - Switch back from Meropenem to Rocephin    - BCx collected x 2, UCX collected and pending - UDS pending - AM CBC, CMP - continue Delirium precaution  Black stool Ongoing for at least 1 week with 4-8 black stools per day. Previous colonoscopy with tubular adenomas and hemorrhoids. Esophageal varices seen on endoscopy in March 2025,  - Continue Octreotide 200 mcg SQ TID - Continue  pantoprazole 40 mg PO BID - General surgery consulted and s/o c/o bowel obstruction VS underline volume overloaded on CTAP  - Pending FOBT - AM CBC Reactive perforating collagenosis This is the leading diagnosis for bilateral lower extremity lesions, given presentation with BUN of 33, apparent chronic presentation, signs of excoriations.  Could also be related to uremic pruritus, or other infectious cause but less likely given sparing soles of feet and no apparent infectious prodrome. - Continue to monitor at this time and treating decompensated cirrhosis and hepatorenal syndrome SBP (spontaneous bacterial peritonitis) (HCC) Normal WBC, no fever last 24 hrs - Pending body fluid culture but no growth to date - Continue Rocephin  2 g IV Q24H ( 11/23/23) for SBP prophylaxis   FEN/GI: Regular PPx: SCD Dispo:Pending PT recommendations  .   Subjective:  Patient mental status significantly improved this morning. His wife at bedside. Patient states that he has been depressed since he lose his job during COVID and was unable to mentally get out of bed d/t depression which has not been treated in the past. Patient was appropriately conversed that he need water as his mouth dried. C/o generalized abdominal pain  Objective: Temp:  [97.6 F (36.4 C)-99.6 F (37.6 C)] 99.6 F (37.6 C) (09/30 9157)  Pulse Rate:  [100-114] 105 (09/30 0842) Resp:  [15-31] 16 (09/30 0842) BP: (82-133)/(60-97) 103/62 (09/30 0842) SpO2:  [98 %-100 %] 100 % (09/30 0842)  Physical Exam Cardiovascular:     Pulses:          Dorsalis  pedis pulses are 2+ on the right side and 2+ on the left side.       Posterior tibial pulses are 2+ on the right side and 2+ on the left side.  Pulmonary:     Effort: Pulmonary effort is normal.     Breath sounds: Normal breath sounds.  Abdominal:     Palpations: Abdomen is soft.     Tenderness: There is abdominal tenderness.  Musculoskeletal:     Left lower leg: 2+ Edema present.  Skin:    General: Skin is warm and dry.     Capillary Refill: Capillary refill takes less than 2 seconds.  Neurological:     General: No focal deficit present.     Mental Status: He is oriented to person, place, and time. Mental status is at baseline.  Psychiatric:        Mood and Affect: Mood normal.        Behavior: Behavior normal.        Thought Content: Thought content normal.      Laboratory: Most recent CBC Lab Results  Component Value Date   WBC 8.6 11/24/2023   HGB 9.5 (L) 11/24/2023   HCT 28.1 (L) 11/24/2023   MCV 97.2 11/24/2023   PLT 111 (L) 11/24/2023   Most recent BMP    Latest Ref Rng & Units 11/24/2023    5:46 AM  BMP  Glucose 70 - 99 mg/dL 89   BUN 6 - 20 mg/dL 29   Creatinine 9.38 - 1.24 mg/dL 7.90   Sodium 864 - 854 mmol/L 138   Potassium 3.5 - 5.1 mmol/L 3.9   Chloride 98 - 111 mmol/L 106   CO2 22 - 32 mmol/L 18   Calcium 8.9 - 10.3 mg/dL 8.6     James Elder B, DO 11/24/2023, 10:16 AM  PGY-1, [facility] FPTS Intern pager: [phone], text pages welcome Secure chat group [facility] Teaching Service

## 2023-11-24 NOTE — Progress Notes (Signed)
 Reason for Consult:HRS Referring Physician: Weakness and altered mental status  James Downs is an 58 y.o. male.   HPI:   James Downs is a 58 year old male with history of non-alcoholic cirrhosis, HTN, HLD, and nephrolithiasis, presenting with weakness, abdominal pain, diarrhea, and several days of altered mental status.  Nephrology was consulted due to concern for hepatorenal syndrome.  Subjective:  Mental status improved, awake and asking when he can go home. Per chart review, yet to receive dose of midodrine, get albumin .   Trend in Creatinine:  Objective:  BP 100/61 (BP Location: Left Arm)   Pulse 87   Temp 98.9 F (37.2 C) (Oral)   Resp 16   Ht 5' 7 (1.702 m)   Wt 81.6 kg   SpO2 99%   BMI 28.19 kg/m  Vitals:   11/24/23 0315 11/24/23 0603 11/24/23 0842 11/24/23 1158  BP: 103/65 106/63 103/62 100/61  Pulse: (!) 102 (!) 105 (!) 105 87  Resp: (!) 24 18 16 16   Temp:  98.6 F (37 C) 99.6 F (37.6 C) 98.9 F (37.2 C)  TempSrc:  Oral Oral Oral  SpO2: 100% 100% 100% 99%  Weight:      Height:       Physical exam Ill-appearing, lying in bed. Jaundiced skin RRR, no murmurs. Abdomen distended, nontender. Awake, follows commands. Extremities without edema, noted hyperkeratotic rash on lower extremities  Labs: Basic Metabolic Panel: Recent Labs  Lab 11/23/23 0903 11/23/23 0922 11/24/23 0546  NA 135 137 138  K 4.2 4.2 3.9  CL 102 106 106  CO2 18*  --  18*  GLUCOSE 98 92 89  BUN 30* 33* 29*  CREATININE 2.23* 2.30* 2.09*  ALBUMIN  2.5*  --  2.9*  CALCIUM 8.6*  --  8.6*   Liver Function Tests: Recent Labs  Lab 11/23/23 0903 11/24/23 0546  AST 49* 34  ALT 23 17  ALKPHOS 59 41  BILITOT 6.5* 5.0*  PROT 7.2 5.9*  ALBUMIN  2.5* 2.9*   Recent Labs  Lab 11/23/23 0903  LIPASE 41   Recent Labs  Lab 11/23/23 1529  AMMONIA 76*   CBC: Recent Labs  Lab 11/23/23 0903 11/23/23 0922 11/23/23 1526 11/24/23 0803  WBC 13.0*  --  10.8* 8.6   NEUTROABS 10.3*  --   --   --   HGB 12.2* 12.2* 10.7* 9.5*  HCT 37.0* 36.0* 31.4* 28.1*  MCV 96.6  --  96.0 97.2  PLT 198  --  151 111*   PT/INR: @LABRCNTIP (inr:5) Cardiac Enzymes: )No results for input(s): CKTOTAL, CKMB, CKMBINDEX, TROPONINI in the last 168 hours. CBG: No results for input(s): GLUCAP in the last 168 hours.  Iron Studies: No results for input(s): IRON, TIBC, TRANSFERRIN, FERRITIN in the last 168 hours.  Xrays/Other Studies: CT HEAD WO CONTRAST ( ) Result Date: 11/23/2023 CLINICAL DATA:  Altered mental status. EXAM: CT HEAD WITHOUT CONTRAST TECHNIQUE: Contiguous axial images were obtained from the base of the skull through the vertex without intravenous contrast. RADIATION DOSE REDUCTION: This exam was performed according to the departmental dose-optimization program which includes automated exposure control, adjustment of the mA and/or kV according to patient size and/or use of iterative reconstruction technique. COMPARISON:  August 11, 2023 FINDINGS: Brain: No evidence of acute infarction, hemorrhage, hydrocephalus, extra-axial collection or mass lesion/mass effect. Vascular: Moderate severity bilateral cavernous carotid artery calcification is noted. Skull: Normal. Negative for fracture or focal lesion. Sinuses/Orbits: No acute finding. Other: None. IMPRESSION: No acute intracranial abnormality. Electronically Signed  By: Suzen Dials M.D.   On: 11/23/2023 18:38   IR Paracentesis Result Date: 11/23/2023 INDICATION: History of chronic renal insufficiency, cirrhosis, recurrent ascites. Request for diagnostic and therapeutic paracentesis. EXAM: ULTRASOUND GUIDED DIAGNOSTIC AND THERAPEUTIC PARACENTESIS MEDICATIONS: 6 mL 1% lidocaine  COMPLICATIONS: None immediate. PROCEDURE: Informed written consent was obtained from the patient after a discussion of the risks, benefits and alternatives to treatment. A timeout was performed prior to the initiation of the  procedure. Initial ultrasound scanning demonstrates a large amount of ascites within the right lower abdominal quadrant. The right lower abdomen was prepped and draped in the usual sterile fashion. 1% lidocaine  was used for local anesthesia. Following this, a 19 gauge, 7-cm, Yueh catheter was introduced. An ultrasound image was saved for documentation purposes. The paracentesis was performed. The catheter was removed and a dressing was applied. The patient tolerated the procedure well without immediate post procedural complication. FINDINGS: A total of approximately 7.9 liters of hazy yellow fluid was removed. Samples were sent to the laboratory as requested by the clinical team. IMPRESSION: Successful ultrasound-guided paracentesis yielding 7.9 liters of peritoneal fluid. Performed by: Wyatt Pommier, PA-C Electronically Signed   By: Juliene Balder M.D.   On: 11/23/2023 15:48   CT ABDOMEN PELVIS W CONTRAST Result Date: 11/23/2023 CLINICAL DATA:  58 year old male with jaundice, sepsis, cirrhosis. Altered mental status, weakness, abdominal pain. EXAM: CT ABDOMEN AND PELVIS WITH CONTRAST TECHNIQUE: Multidetector CT imaging of the abdomen and pelvis was performed using the standard protocol following bolus administration of intravenous contrast. RADIATION DOSE REDUCTION: This exam was performed according to the departmental dose-optimization program which includes automated exposure control, adjustment of the mA and/or kV according to patient size and/or use of iterative reconstruction technique. CONTRAST:  75mL OMNIPAQUE  IOHEXOL  350 MG/ML SOLN COMPARISON:  CT Abdomen and Pelvis 05/19/2023. FINDINGS: Lower chest: No pericardial or pleural effusion. Minor dependent lung base atelectasis. Hepatobiliary: Small, cirrhotic liver. Liver is highly nodular. No discrete mass. Contracted gallbladder with cholelithiasis. Pancreas: Negative. Spleen: Spleen size relatively normal, stable to mildly decreased in size since March.  Adrenals/Urinary Tract: Extensive chronic nephrolithiasis. Kidneys appear nonobstructed despite the bilateral stones including in the left renal pelvis. Symmetric renal enhancement. But little to no renal contrast excretion on the delayed images (2 minute delay). Diminutive ureters. Negative adrenal glands. Diminutive urinary bladder, compressed from pelvic ascites and best seen on sagittal image 73. Stomach/Bowel: Large volume ascites, progressed since March. Circumferential large bowel wall thickening intermittently noted, especially at the hepatic flexure, splenic flexure, descending colon. Relatively decompressed stomach, duodenum and small bowel. No pneumoperitoneum. Vascular/Lymphatic: Aortoiliac calcified atherosclerosis. Normal caliber abdominal aorta. Portal venous system and other major vascular structures in the abdomen and pelvis appear patent. No lymphadenopathy. Reproductive: Relatively large volume ascites tracking in both inguinal canals is new (series 3, image 100), simple fluid density. Other: Progressed pelvic ascites, large volume. Simple fluid density. Musculoskeletal: Previous left lower thoracic laminectomies, stable. Lumbar spine degeneration with stable mild spondylolisthesis. No acute osseous abnormality identified. IMPRESSION: 1. Cirrhosis with a Large volume of Ascites, progressed since March, and fluid newly distending the bilateral inguinal canals. 2. Little to no renal contrast excretion on the delayed images raising the possibility of renal insufficiency, hepatorenal syndrome. Underlying extensive nephrolithiasis but no evidence of obstructive uropathy. 3. Intermittent circumferential large bowel wall thickening, consider Portal Colopathy versus Acute Colitis. Bowel obstruction. 4. Cholelithiasis.  Aortic Atherosclerosis (ICD10-I70.0). Electronically Signed   By: VEAR Hurst M.D.   On: 11/23/2023 11:20   DG  Chest Port 1 View Result Date: 11/23/2023 CLINICAL DATA:  Questionable sepsis -  evaluate for abnormality EXAM: PORTABLE CHEST - 1 VIEW COMPARISON:  May 19, 2023 FINDINGS: Markedly low lung volumes. No focal airspace consolidation, pleural effusion, or pneumothorax. No cardiomegaly. Aortic atherosclerosis. No acute fracture or destructive lesions. Multilevel thoracic osteophytosis. IMPRESSION: No acute cardiopulmonary abnormality. Electronically Signed   By: Rogelia Myers M.D.   On: 11/23/2023 10:17     Assessment/Plan: Acute Kidney Injury Concern for HRS  SCr improving, treating HRS with albumin , midodrine and octreotide.  Unable to do terlipressin due to availability. -Strict I's and O's, daily weight, BMP. - Continue treatment as above.  Adreanna Fickel 11/24/2023, 12:56 PM

## 2023-11-24 NOTE — Assessment & Plan Note (Signed)
 Ongoing for at least 1 week with 4-8 black stools per day. Previous colonoscopy with tubular adenomas and hemorrhoids. Esophageal varices seen on endoscopy in March 2025,  - Continue Octreotide 200 mcg SQ TID - Continue  pantoprazole 40 mg PO BID - General surgery consulted and s/o c/o bowel obstruction VS underline volume overloaded on CTAP  - Pending FOBT - AM CBC

## 2023-11-24 NOTE — Progress Notes (Addendum)
 Subjective: No complaints.  Objective: Vital signs in last 24 hours: Temp:  [97.6 F (36.4 C)-99.6 F (37.6 C)] 99.6 F (37.6 C) (09/30 0842) Pulse Rate:  [100-114] 105 (09/30 0842) Resp:  [16-31] 16 (09/30 0842) BP: (82-133)/(60-97) 103/62 (09/30 0842) SpO2:  [98 %-100 %] 100 % (09/30 0842) Last BM Date : 11/24/23  Intake/Output from previous day: 09/29 0701 - 09/30 0700 In: 220 [IV Piggyback:220] Out: 1400 [Stool:1400] Intake/Output this shift: No intake/output data recorded.  General appearance: slowed mentation GI: soft, + ascites, some tenderness with palpation  Lab Results: Recent Labs    11/23/23 0903 11/23/23 0922 11/23/23 1526 11/24/23 0803  WBC 13.0*  --  10.8* 8.6  HGB 12.2* 12.2* 10.7* 9.5*  HCT 37.0* 36.0* 31.4* 28.1*  PLT 198  --  151 111*   BMET Recent Labs    11/23/23 0903 11/23/23 0922 11/24/23 0546  NA 135 137 138  K 4.2 4.2 3.9  CL 102 106 106  CO2 18*  --  18*  GLUCOSE 98 92 89  BUN 30* 33* 29*  CREATININE 2.23* 2.30* 2.09*  CALCIUM 8.6*  --  8.6*   LFT Recent Labs    11/24/23 0546  PROT 5.9*  ALBUMIN  2.9*  AST 34  ALT 17  ALKPHOS 41  BILITOT 5.0*   PT/INR Recent Labs    11/23/23 0903 11/24/23 0546  LABPROT 17.8* 24.0*  INR 1.4* 2.0*   Hepatitis Panel No results for input(s): HEPBSAG, HCVAB, HEPAIGM, HEPBIGM in the last 72 hours. C-Diff No results for input(s): CDIFFTOX in the last 72 hours. Fecal Lactopherrin No results for input(s): FECLLACTOFRN in the last 72 hours.  Studies/Results: CT HEAD WO CONTRAST ( ) Result Date: 11/23/2023 CLINICAL DATA:  Altered mental status. EXAM: CT HEAD WITHOUT CONTRAST TECHNIQUE: Contiguous axial images were obtained from the base of the skull through the vertex without intravenous contrast. RADIATION DOSE REDUCTION: This exam was performed according to the departmental dose-optimization program which includes automated exposure control, adjustment of the mA and/or kV  according to patient size and/or use of iterative reconstruction technique. COMPARISON:  August 11, 2023 FINDINGS: Brain: No evidence of acute infarction, hemorrhage, hydrocephalus, extra-axial collection or mass lesion/mass effect. Vascular: Moderate severity bilateral cavernous carotid artery calcification is noted. Skull: Normal. Negative for fracture or focal lesion. Sinuses/Orbits: No acute finding. Other: None. IMPRESSION: No acute intracranial abnormality. Electronically Signed   By: Suzen Dials M.D.   On: 11/23/2023 18:38   IR Paracentesis Result Date: 11/23/2023 INDICATION: History of chronic renal insufficiency, cirrhosis, recurrent ascites. Request for diagnostic and therapeutic paracentesis. EXAM: ULTRASOUND GUIDED DIAGNOSTIC AND THERAPEUTIC PARACENTESIS MEDICATIONS: 6 mL 1% lidocaine  COMPLICATIONS: None immediate. PROCEDURE: Informed written consent was obtained from the patient after a discussion of the risks, benefits and alternatives to treatment. A timeout was performed prior to the initiation of the procedure. Initial ultrasound scanning demonstrates a large amount of ascites within the right lower abdominal quadrant. The right lower abdomen was prepped and draped in the usual sterile fashion. 1% lidocaine  was used for local anesthesia. Following this, a 19 gauge, 7-cm, Yueh catheter was introduced. An ultrasound image was saved for documentation purposes. The paracentesis was performed. The catheter was removed and a dressing was applied. The patient tolerated the procedure well without immediate post procedural complication. FINDINGS: A total of approximately 7.9 liters of hazy yellow fluid was removed. Samples were sent to the laboratory as requested by the clinical team. IMPRESSION: Successful ultrasound-guided paracentesis yielding 7.9 liters of  peritoneal fluid. Performed by: Wyatt Pommier, PA-C Electronically Signed   By: Juliene Balder M.D.   On: 11/23/2023 15:48   CT ABDOMEN PELVIS W  CONTRAST Result Date: 11/23/2023 CLINICAL DATA:  58 year old male with jaundice, sepsis, cirrhosis. Altered mental status, weakness, abdominal pain. EXAM: CT ABDOMEN AND PELVIS WITH CONTRAST TECHNIQUE: Multidetector CT imaging of the abdomen and pelvis was performed using the standard protocol following bolus administration of intravenous contrast. RADIATION DOSE REDUCTION: This exam was performed according to the departmental dose-optimization program which includes automated exposure control, adjustment of the mA and/or kV according to patient size and/or use of iterative reconstruction technique. CONTRAST:  75mL OMNIPAQUE  IOHEXOL  350 MG/ML SOLN COMPARISON:  CT Abdomen and Pelvis 05/19/2023. FINDINGS: Lower chest: No pericardial or pleural effusion. Minor dependent lung base atelectasis. Hepatobiliary: Small, cirrhotic liver. Liver is highly nodular. No discrete mass. Contracted gallbladder with cholelithiasis. Pancreas: Negative. Spleen: Spleen size relatively normal, stable to mildly decreased in size since March. Adrenals/Urinary Tract: Extensive chronic nephrolithiasis. Kidneys appear nonobstructed despite the bilateral stones including in the left renal pelvis. Symmetric renal enhancement. But little to no renal contrast excretion on the delayed images (2 minute delay). Diminutive ureters. Negative adrenal glands. Diminutive urinary bladder, compressed from pelvic ascites and best seen on sagittal image 73. Stomach/Bowel: Large volume ascites, progressed since March. Circumferential large bowel wall thickening intermittently noted, especially at the hepatic flexure, splenic flexure, descending colon. Relatively decompressed stomach, duodenum and small bowel. No pneumoperitoneum. Vascular/Lymphatic: Aortoiliac calcified atherosclerosis. Normal caliber abdominal aorta. Portal venous system and other major vascular structures in the abdomen and pelvis appear patent. No lymphadenopathy. Reproductive: Relatively  large volume ascites tracking in both inguinal canals is new (series 3, image 100), simple fluid density. Other: Progressed pelvic ascites, large volume. Simple fluid density. Musculoskeletal: Previous left lower thoracic laminectomies, stable. Lumbar spine degeneration with stable mild spondylolisthesis. No acute osseous abnormality identified. IMPRESSION: 1. Cirrhosis with a Large volume of Ascites, progressed since March, and fluid newly distending the bilateral inguinal canals. 2. Little to no renal contrast excretion on the delayed images raising the possibility of renal insufficiency, hepatorenal syndrome. Underlying extensive nephrolithiasis but no evidence of obstructive uropathy. 3. Intermittent circumferential large bowel wall thickening, consider Portal Colopathy versus Acute Colitis. Bowel obstruction. 4. Cholelithiasis.  Aortic Atherosclerosis (ICD10-I70.0). Electronically Signed   By: VEAR Hurst M.D.   On: 11/23/2023 11:20   DG Chest Port 1 View Result Date: 11/23/2023 CLINICAL DATA:  Questionable sepsis - evaluate for abnormality EXAM: PORTABLE CHEST - 1 VIEW COMPARISON:  May 19, 2023 FINDINGS: Markedly low lung volumes. No focal airspace consolidation, pleural effusion, or pneumothorax. No cardiomegaly. Aortic atherosclerosis. No acute fracture or destructive lesions. Multilevel thoracic osteophytosis. IMPRESSION: No acute cardiopulmonary abnormality. Electronically Signed   By: Rogelia Myers M.D.   On: 11/23/2023 10:17    Medications: Scheduled:  lactulose   300 mL Rectal Q4H   midodrine  7.5 mg Oral TID WC   octreotide  200 mcg Subcutaneous TID   pantoprazole (PROTONIX) IV  40 mg Intravenous Q12H   Continuous:  albumin  human 12.5 g (11/24/23 0941)   meropenem (MERREM) IV 1 g (11/24/23 0958)    Assessment/Plan: 1) Hepatic encephalopathy. 2) Decompensated MASH cirrhosis. 3) Ascites. 4) AKI with CKD.   The patient's mentation is improved today, but he is still very slow with  responding to questions.  AKI is improving with albumin , midodrine, and IV hydration.  He did express some mild tenderness/tightness with palpation of his abdomen.  His anemia is relatively stable.  The INR did increase compared to yesterday.  Plan: 1) Repeat paracentesis for comfort in he near future if diuretics cannot be started with his renal issues. 2) Maintain lactulose  and add on Xifaxan 550 mg BID. 3) Maintain albumin , midodrine, and gentle IV hydration. 4) No need to fluid restrict as he is not hyponatremic. 5) 2 gram sodium diet.  LOS: 1 day   Taraneh Metheney D 11/24/2023, 11:06 AM

## 2023-11-24 NOTE — Assessment & Plan Note (Signed)
 Normal WBC, no fever last 24 hrs - Pending body fluid culture but no growth to date - Continue Rocephin  2 g IV Q24H ( 11/23/23) for SBP prophylaxis

## 2023-11-24 NOTE — Evaluation (Signed)
 Clinical/Bedside Swallow Evaluation Patient Details  Name: James Downs MRN: [id number] Date of Birth: 11-06-1965  Today's Date: 11/24/2023 Time: SLP Start Time (ACUTE ONLY): 0920 SLP Stop Time (ACUTE ONLY): 0935 SLP Time Calculation (min) (ACUTE ONLY): 15 min  Past Medical History:  Past Medical History:  Diagnosis Date   Arthritis    Depression    Gout    Hypercholesterolemia    Hypertension    on meds   Kidney damage    Kidney stone    Obesity    Sleep apnea    uses CPAP   Past Surgical History:  Past Surgical History:  Procedure Laterality Date   FOOT SURGERY Right 1991   HAND SURGERY Right 1985   IR PARACENTESIS  11/23/2023   POSTERIOR LUMBAR FUSION 4 LEVEL N/A 11/29/2013   Procedure: Thoracic Eight, Thoracic Nine, Costotransversectomy   Thoracic Seven-Eight Thoracic Eight-Nine diskectomy;  Surgeon: Gerldine Maizes, MD;  Location: [facility];  Service: Neurosurgery;  Laterality: N/A;  Thoracic Eight, Thoracic Nine, Costotransversectomy   Thoracic Seven-Eight Thoracic Eight-Nine diskectomy   HPI:  James Downs is a 58 year old male with history of non-alcoholic cirrhosis, HTN, HLD, and nephrolithiasis, presenting with weakness, abdominal pain, diarrhea, and several days of altered mental status.  Dx with acute decompensated non-alcoholic cirrhosis with concern for hepatorenal syndrome    Assessment / Plan / Recommendation  Clinical Impression  Patient presents with a functional oropharyngeal swallow with intact and efficient oral phase despite missing dentures (at home) and no overt s/s of aspiration across consistencies during exam. Post exam he did have one episode of coughing while consuming a small pill, whole with large consecutive sips of thin liquid. Subsequent pills given whole in applesauce without overt indication of aspiration. Suspect that with a slower rate, patient would tolerate meds with liquid well. No h/o swallowing difficulty indicated by  patient or spouse. No SLP f/u indicated. Nursing to provided pills as tolerated, whole in puree if s/s of aspiration persist with liquids although do not suspect this will be the case. SLP Visit Diagnosis: Dysphagia, unspecified (R13.10)    Aspiration Risk  Mild aspiration risk    Diet Recommendation Regular;Thin liquid    Liquid Administration via: Cup;Straw Medication Administration: Whole meds with liquid (or whole in puree if needed) Supervision: Patient able to self feed Compensations: Slow rate;Small sips/bites Postural Changes: Seated upright at 90 degrees    Other  Recommendations Oral Care Recommendations: Oral care BID        Functional Status Assessment Patient has not had a recent decline in their functional status    Swallow Study   General HPI: James Downs is a 58 year old male with history of non-alcoholic cirrhosis, HTN, HLD, and nephrolithiasis, presenting with weakness, abdominal pain, diarrhea, and several days of altered mental status.  Dx with acute decompensated non-alcoholic cirrhosis with concern for hepatorenal syndrome Type of Study: Bedside Swallow Evaluation Previous Swallow Assessment: none Diet Prior to this Study: NPO Temperature Spikes Noted: No Respiratory Status: Room air History of Recent Intubation: No Behavior/Cognition: Alert;Cooperative;Pleasant mood Oral Cavity Assessment: Within Functional Limits Oral Care Completed by SLP: No Oral Cavity - Dentition: Dentures, not available Vision: Functional for self-feeding Self-Feeding Abilities: Able to feed self Patient Positioning: Upright in bed Baseline Vocal Quality: Normal Volitional Cough: Weak Volitional Swallow: Able to elicit    Oral/Motor/Sensory Function Overall Oral Motor/Sensory Function: Within functional limits   Ice Chips Ice chips: Not tested   Thin Liquid Thin Liquid: Within functional  limits Presentation: Cup;Self Fed;Straw    Nectar Thick Nectar Thick Liquid: Not  tested   Honey Thick Honey Thick Liquid: Not tested   Puree Puree: Within functional limits Presentation: Self Fed;Spoon   Solid     Solid: Within functional limits Presentation: Self Fed     Shantavia Jha MA, CCC-SLP  Rodel Glaspy Meryl 11/24/2023,10:31 AM

## 2023-11-24 NOTE — Assessment & Plan Note (Signed)
 This is the leading diagnosis for bilateral lower extremity lesions, given presentation with BUN of 33, apparent chronic presentation, signs of excoriations.  Could also be related to uremic pruritus, or other infectious cause but less likely given sparing soles of feet and no apparent infectious prodrome. - Continue to monitor at this time and treating decompensated cirrhosis and hepatorenal syndrome

## 2023-11-24 NOTE — Evaluation (Signed)
 Physical Therapy Evaluation Patient Details Name: James Downs MRN: [id number] DOB: Apr 20, 1965 Today's Date: 11/24/2023  History of Present Illness  58 year old male presenting 9/29 with weakness, abdominal pain, diarrhea, and several days of altered mental status. Dx Hepatic encephalopathy, Decompensated NASH cirrhosis, Ascites, AKI with CKD. PMHof non-alcoholic cirrhosis, HTN, HLD, and nephrolithiasis,  Clinical Impression  Pt admitted with above diagnosis. Previously independent, using a rolling walker at home per spouse. Pt very lethargic during exam but agreeable to participate as tolerated. Required min assist for bed mobility and transfer. Able to stand twice from edge of bed with RW to steady but fatigues rapidly and needs to sit for recovery. Took a few lateral steps along bed with min assist and RW. Deferred gait due to keeping eyes closed and lethargy. Pt has decent potential to improve function but will depend on his alertness level. Current suggest continued inpatient follow up therapy, <3 hours/day. If he improves quickly, may be a good candidate for HHPT. Pt currently with functional limitations due to the deficits listed below (see PT Problem List). Pt will benefit from acute skilled PT to increase their independence and safety with mobility to allow discharge.           If plan is discharge home, recommend the following: A little help with bathing/dressing/bathroom;A lot of help with walking and/or transfers;Assistance with cooking/housework;Direct supervision/assist for medications management;Direct supervision/assist for financial management;Assist for transportation   Can travel by private vehicle   No (likely soon)    Equipment Recommendations None recommended by PT  Recommendations for Other Services       Functional Status Assessment Patient has had a recent decline in their functional status and demonstrates the ability to make significant improvements in function  in a reasonable and predictable amount of time.     Precautions / Restrictions Precautions Precautions: Fall Recall of Precautions/Restrictions: Impaired Restrictions Weight Bearing Restrictions Per Provider Order: No      Mobility  Bed Mobility Overal bed mobility: Needs Assistance Bed Mobility: Rolling, Sidelying to Sit, Sit to Sidelying Rolling: Min assist Sidelying to sit: Min assist, HOB elevated, Used rails     Sit to sidelying: Min assist, Used rails General bed mobility comments: Min assist to facilitate and sequence to EOB and back . Cues throughout.    Transfers Overall transfer level: Needs assistance Equipment used: Rolling walker (2 wheels) Transfers: Sit to/from Stand Sit to Stand: Min assist           General transfer comment: Min assist for boost to stand x2 from edge of bed. Cues for hand placement, to keep eyes open. Able to steady with RW for support. Fatigues quickly and needs to sit again. Progressed with a few lateral steps along bed to reposition.    Ambulation/Gait               General Gait Details: deferred due to lethargy  Stairs            Wheelchair Mobility     Tilt Bed    Modified Rankin (Stroke Patients Only)       Balance Overall balance assessment: Needs assistance Sitting-balance support: Bilateral upper extremity supported Sitting balance-Leahy Scale: Poor Sitting balance - Comments: CGA   Standing balance support: Bilateral upper extremity supported, Reliant on assistive device for balance Standing balance-Leahy Scale: Poor  Pertinent Vitals/Pain Pain Assessment Pain Assessment: PAINAD Breathing: normal Negative Vocalization: none Facial Expression: smiling or inexpressive Body Language: relaxed Consolability: no need to console PAINAD Score: 0 Pain Intervention(s): Monitored during session    Home Living Family/patient expects to be discharged to::  Private residence Living Arrangements: Spouse/significant other Available Help at Discharge: Family;Available 24 hours/day Type of Home: House Home Access: Level entry (small threshold)       Home Layout: One level Home Equipment: Agricultural consultant (2 wheels);Cane - single point;Grab bars - toilet;Grab bars - tub/shower      Prior Function Prior Level of Function : Independent/Modified Independent             Mobility Comments: Spouse reports ind, no falls, using RW for household distances ADLs Comments: Spouse reports pt independent with ADLs.     Extremity/Trunk Assessment   Upper Extremity Assessment Upper Extremity Assessment: Defer to OT evaluation    Lower Extremity Assessment Lower Extremity Assessment: Generalized weakness;Difficult to assess due to impaired cognition       Communication   Communication Communication: No apparent difficulties    Cognition Arousal: Lethargic Behavior During Therapy: Flat affect   PT - Cognitive impairments: Difficult to assess, Attention, Sequencing, Problem solving Difficult to assess due to: Level of arousal                     PT - Cognition Comments: Quite lethargic but answering questions intermittently Following commands: Impaired Following commands impaired: Follows one step commands with increased time, Follows one step commands inconsistently     Cueing Cueing Techniques: Verbal cues, Gestural cues, Tactile cues, Visual cues     General Comments      Exercises General Exercises - Lower Extremity Ankle Circles/Pumps: AROM, Both, 5 reps, Supine Quad Sets: Strengthening, Both, 5 reps, Supine   Assessment/Plan    PT Assessment Patient needs continued PT services  PT Problem List Decreased strength;Decreased activity tolerance;Decreased mobility;Decreased balance;Decreased cognition;Decreased coordination;Decreased knowledge of use of DME;Decreased safety awareness;Decreased knowledge of precautions        PT Treatment Interventions DME instruction;Gait training;Functional mobility training;Therapeutic activities;Therapeutic exercise;Balance training;Neuromuscular re-education;Cognitive remediation;Patient/family education    PT Goals (Current goals can be found in the Care Plan section)  Acute Rehab PT Goals Patient Stated Goal: none stated - wife would like for patient to be stronger before coming home PT Goal Formulation: With patient/family Time For Goal Achievement: 12/08/23 Potential to Achieve Goals: Good    Frequency Min 2X/week     Co-evaluation               AM-PAC PT 6 Clicks Mobility  Outcome Measure Help needed turning from your back to your side while in a flat bed without using bedrails?: A Little Help needed moving from lying on your back to sitting on the side of a flat bed without using bedrails?: A Little Help needed moving to and from a bed to a chair (including a wheelchair)?: A Little Help needed standing up from a chair using your arms (e.g., wheelchair or bedside chair)?: A Little Help needed to walk in hospital room?: A Lot Help needed climbing 3-5 steps with a railing? : Total 6 Click Score: 15    End of Session Equipment Utilized During Treatment: Gait belt Activity Tolerance: Patient limited by lethargy;Patient limited by fatigue Patient left: in bed;with call bell/phone within reach;with bed alarm set;with family/visitor present   PT Visit Diagnosis: Unsteadiness on feet (R26.81);Other abnormalities of gait and mobility (  R26.89);Muscle weakness (generalized) (M62.81);Difficulty in walking, not elsewhere classified (R26.2);Other symptoms and signs involving the nervous system (R29.898)    Time: 8482-8466 PT Time Calculation (min) (ACUTE ONLY): 16 min   Charges:   PT Evaluation $PT Eval Moderate Complexity: 1 Mod   PT General Charges $$ ACUTE PT VISIT: 1 Visit         Leontine Roads, PT, DPT [facility]  Re[facility]  Services Physical Therapist Office: [phone] Website: [email or web address]   Leontine GORMAN Roads 11/24/2023, 4:57 PM

## 2023-11-24 NOTE — Progress Notes (Incomplete)
 Pt arrived to the unit via stretcher. Pt is alert and oriented

## 2023-11-24 NOTE — Plan of Care (Signed)
 Called daughter, Ricahrd Schwager, at her request to answer questions she had.  Daughter was concerned that information was being given to her mother, who daughter does not believe is able to comprehend and then relay information and updates given about the patient's status.  Explained to daughter that our team had been told that Arthea Gander, patient's son, was the best person to reach out to about updates, and so our day team had spoken with him earlier today.  Courtney relayed that Zachary's work would make it difficult for him to continue to receive such updates.  Informed [name] that, as a family, they should figure out the best person and the best time to receive updates.  Also explained that our schedule as the inpatient team is varied, and not always conducive to providing updates and a set time.  Informed her we would try her best to continue to update the family, but we might not always be able to do so, especially if patient is stable.  [name] also had questions concerning any updates of the patient's MELD score, which I informed her generally is updated over a span of 1 to 3 months and not over a span of days.  She was also again asking about prognosis/timeline, and explained to her that the MELD score suggests a potential mortality risk, but is not a definitive.  She wanted to know what had caused this episode, and I informed her that, although we do not have a definitive reason, our working theory is that the patient experienced hepatic encephalopathy.  She was wondering about where patient would go after the hospital, to which I replied that he would most likely not be able to go home, though unsure at this time if he would need short or long-term care.  Finally, she wondered about whether patient had experienced more GI bleeding, and I informed her that we are keeping an eye on patient's blood count (which has been stable) and that we would investigate this more once he is stable from a  blood pressure and AMS perspective.  Emphasized to Waller that the day team would be the best source of updates on the patient's condition moving forward as they are the ones more actively managing the plan, night team is here to react to changes should they arise.  Encouraged her to keep a journal of questions so as to have everything organized in 1 place.  Again encouraged her to speak with family and designate 1 point person to whom our day team can relay updates.

## 2023-11-24 NOTE — ED Notes (Signed)
 Report attempted

## 2023-11-25 ENCOUNTER — Telehealth (HOSPITAL_COMMUNITY): Payer: Self-pay | Admitting: Pharmacy Technician

## 2023-11-25 ENCOUNTER — Other Ambulatory Visit (HOSPITAL_COMMUNITY): Payer: Self-pay

## 2023-11-25 ENCOUNTER — Inpatient Hospital Stay (HOSPITAL_COMMUNITY)

## 2023-11-25 DIAGNOSIS — K7460 Unspecified cirrhosis of liver: Secondary | ICD-10-CM

## 2023-11-25 DIAGNOSIS — K7290 Hepatic failure, unspecified without coma: Secondary | ICD-10-CM

## 2023-11-25 DIAGNOSIS — F32A Depression, unspecified: Secondary | ICD-10-CM | POA: Insufficient documentation

## 2023-11-25 LAB — COMPREHENSIVE METABOLIC PANEL WITH GFR
ALT: 14 U/L (ref 0–44)
AST: 28 U/L (ref 15–41)
Albumin: 2.6 g/dL — ABNORMAL LOW (ref 3.5–5.0)
Alkaline Phosphatase: 37 U/L — ABNORMAL LOW (ref 38–126)
Anion gap: 8 (ref 5–15)
BUN: 30 mg/dL — ABNORMAL HIGH (ref 6–20)
CO2: 19 mmol/L — ABNORMAL LOW (ref 22–32)
Calcium: 8.1 mg/dL — ABNORMAL LOW (ref 8.9–10.3)
Chloride: 106 mmol/L (ref 98–111)
Creatinine, Ser: 2.12 mg/dL — ABNORMAL HIGH (ref 0.61–1.24)
GFR, Estimated: 35 mL/min — ABNORMAL LOW (ref >60)
Glucose, Bld: 132 mg/dL — ABNORMAL HIGH (ref 70–99)
Potassium: 3.5 mmol/L (ref 3.5–5.1)
Sodium: 133 mmol/L — ABNORMAL LOW (ref 135–145)
Total Bilirubin: 3.5 mg/dL — ABNORMAL HIGH (ref 0.0–1.2)
Total Protein: 5.3 g/dL — ABNORMAL LOW (ref 6.5–8.1)

## 2023-11-25 LAB — CBC
HCT: 32.4 % — ABNORMAL LOW (ref 39.0–52.0)
Hemoglobin: 10.9 g/dL — ABNORMAL LOW (ref 13.0–17.0)
MCH: 32.1 pg (ref 26.0–34.0)
MCHC: 33.6 g/dL (ref 30.0–36.0)
MCV: 95.3 fL (ref 80.0–100.0)
Platelets: 130 K/uL — ABNORMAL LOW (ref 150–400)
RBC: 3.4 MIL/uL — ABNORMAL LOW (ref 4.22–5.81)
RDW: 16.3 % — ABNORMAL HIGH (ref 11.5–15.5)
WBC: 10.3 K/uL (ref 4.0–10.5)
nRBC: 0 % (ref 0.0–0.2)

## 2023-11-25 LAB — PROTIME-INR
INR: 2.1 — ABNORMAL HIGH (ref 0.8–1.2)
Prothrombin Time: 24.3 s — ABNORMAL HIGH (ref 11.4–15.2)

## 2023-11-25 MED ORDER — ESCITALOPRAM OXALATE 10 MG PO TABS
5.0000 mg | ORAL_TABLET | Freq: Every day | ORAL | Status: DC
  Administered 2023-11-25 – 2023-12-11 (×17): 5 mg via ORAL
  Filled 2023-11-25 (×17): qty 1

## 2023-11-25 MED ORDER — ENSURE PLUS HIGH PROTEIN PO LIQD
237.0000 mL | Freq: Two times a day (BID) | ORAL | Status: DC
Start: 2023-11-26 — End: 2023-12-05
  Administered 2023-11-26 – 2023-12-05 (×14): 237 mL via ORAL

## 2023-11-25 MED ORDER — LACTULOSE 10 GM/15ML PO SOLN
20.0000 g | Freq: Three times a day (TID) | ORAL | Status: DC
  Administered 2023-11-25 – 2023-11-26 (×3): 20 g via ORAL
  Filled 2023-11-25 (×3): qty 30

## 2023-11-25 MED ORDER — LIDOCAINE-EPINEPHRINE 1 %-1:100000 IJ SOLN
10.0000 mL | Freq: Once | INTRAMUSCULAR | Status: AC
  Administered 2023-11-25: 10 mL via INTRADERMAL

## 2023-11-25 MED ORDER — LIDOCAINE-EPINEPHRINE 1 %-1:100000 IJ SOLN
INTRAMUSCULAR | Status: AC
  Filled 2023-11-25: qty 1

## 2023-11-25 MED ORDER — MIDODRINE HCL 5 MG PO TABS: 20.0000 mg | ORAL_TABLET | Freq: Three times a day (TID) | ORAL | Status: DC

## 2023-11-25 NOTE — Plan of Care (Signed)

## 2023-11-25 NOTE — Evaluation (Signed)
 Occupational Therapy Evaluation Patient Details Name: James Downs MRN: [id number] DOB: 02-21-1966 Today's Date: 11/25/2023   History of Present Illness   58 year old male presenting 9/29 with weakness, abdominal pain, diarrhea, and several days of altered mental status. Dx Hepatic encephalopathy, Decompensated NASH cirrhosis, Ascites, AKI with CKD. PMHof non-alcoholic cirrhosis, HTN, HLD, and nephrolithiasis,     Clinical Impressions Pt admitted for above details. PTA, he was indep with ADLs, IADLs and mobility via RW. + Driving and managing his medications. Pt greeted in supine, asleep but wakefulness improved with stim and mobility. He presented today with generalized weakness, reduced attention/problem solving, and decreased activity tolerance. Functionally, he required up to mod A for bed mobility, min A +2 for sit<>stand transfers, and no more than CGA for functional ambulation via RW. Anticipated max-total A for LB ADLs; CGA for standing UB grooming. Returned to bed at end of session.  Pt is currently functioning below baseline and would benefit from ongoing acute OT services to progress towards safe discharge and to facilitate return to prior level of function. Current recommendation is home with home health OT.      If plan is discharge home, recommend the following:   A little help with walking and/or transfers;A little help with bathing/dressing/bathroom;Assistance with cooking/housework;Direct supervision/assist for medications management;Supervision due to cognitive status;Direct supervision/assist for financial management;Assist for transportation     Functional Status Assessment   Patient has had a recent decline in their functional status and demonstrates the ability to make significant improvements in function in a reasonable and predictable amount of time.     Equipment Recommendations   None recommended by OT     Recommendations for Other Services          Precautions/Restrictions   Precautions Precautions: Fall Recall of Precautions/Restrictions: Impaired Precaution/Restrictions Comments: delirium prevention Restrictions Weight Bearing Restrictions Per Provider Order: No     Mobility Bed Mobility Overal bed mobility: Needs Assistance Bed Mobility: Rolling, Sidelying to Sit, Sit to Supine Rolling: Mod assist, Used rails Sidelying to sit: Mod assist, Used rails, HOB elevated   Sit to supine: Mod assist   General bed mobility comments: cues for sequencing, HOB assist    Transfers Overall transfer level: Needs assistance Equipment used: Rolling walker (2 wheels) Transfers: Sit to/from Stand Sit to Stand: Min assist, +2 physical assistance           General transfer comment: Cues for hand placement & assist for powering up      Balance Overall balance assessment: Needs assistance Sitting-balance support: Feet supported, Bilateral upper extremity supported Sitting balance-Leahy Scale: Fair Sitting balance - Comments: seated EOB - intermittent CGA   Standing balance support: No upper extremity supported Standing balance-Leahy Scale: Fair Standing balance comment: CGA for stability while engaged in UB ADLs at the sink with BUE removed from support                           ADL either performed or assessed with clinical judgement   ADL Overall ADL's : Needs assistance/impaired     Grooming: Contact guard assist;Wash/dry hands;Standing               Lower Body Dressing: Maximal assistance;Sitting/lateral leans   Toilet Transfer: Total assistance Toilet Transfer Details (indicate cue type and reason): flexiseal & foley intact         Functional mobility during ADLs: Contact guard assist;Rolling walker (2 wheels)  Vision Baseline Vision/History: 1 Wears glasses Ability to See in Adequate Light: 1 Impaired Patient Visual Report: No change from baseline Vision Assessment?: No  apparent visual deficits (wears glasses full time)     Perception         Praxis         Pertinent Vitals/Pain Pain Assessment Pain Assessment: No/denies pain Breathing: normal     Extremity/Trunk Assessment Upper Extremity Assessment Upper Extremity Assessment: Generalized weakness           Communication Communication Communication: No apparent difficulties   Cognition Arousal: Alert (initially asleep and drowsy, inc time to wake up) Behavior During Therapy: Flat affect Cognition: Cognition impaired     Awareness: Online awareness impaired Memory impairment (select all impairments): Short-term memory Attention impairment (select first level of impairment): Selective attention Executive functioning impairment (select all impairments): Organization, Sequencing, Problem solving OT - Cognition Comments: wife reporting a recent decline in pt's cognition                 Following commands: Impaired Following commands impaired: Follows one step commands with increased time, Follows multi-step commands inconsistently     Cueing  General Comments   Cueing Techniques: Verbal cues;Tactile cues;Visual cues  supportive spouse present at bedside   Exercises     Shoulder Instructions      Home Living Family/patient expects to be discharged to:: Private residence Living Arrangements: Spouse/significant other Available Help at Discharge: Family;Available 24 hours/day Type of Home: House Home Access: Level entry (small threshold step)     Home Layout: One level     Bathroom Shower/Tub: Chief Strategy Officer: Standard     Home Equipment: Agricultural consultant (2 wheels);Cane - single point;Grab bars - toilet;Grab bars - tub/shower          Prior Functioning/Environment Prior Level of Function : Independent/Modified Independent             Mobility Comments: RW for household distances ADLs Comments: indep with ADLs/IADLs PTA    OT Problem  List: Decreased strength;Decreased activity tolerance;Impaired balance (sitting and/or standing);Decreased cognition   OT Treatment/Interventions: Self-care/ADL training;Energy conservation;DME and/or AE instruction;Therapeutic activities;Patient/family education;Balance training      OT Goals(Current goals can be found in the care plan section)   Acute Rehab OT Goals Patient Stated Goal: to go home OT Goal Formulation: With patient/family Time For Goal Achievement: 12/09/23 Potential to Achieve Goals: Good   OT Frequency:  Min 2X/week    Co-evaluation PT/OT/SLP Co-Evaluation/Treatment: Yes Reason for Co-Treatment: For patient/therapist safety;To address functional/ADL transfers PT goals addressed during session: Mobility/safety with mobility;Balance;Proper use of DME OT goals addressed during session: ADL's and self-care      AM-PAC OT 6 Clicks Daily Activity     Outcome Measure Help from another person eating meals?: None Help from another person taking care of personal grooming?: A Little Help from another person toileting, which includes using toliet, bedpan, or urinal?: Total Help from another person bathing (including washing, rinsing, drying)?: A Lot Help from another person to put on and taking off regular upper body clothing?: A Little Help from another person to put on and taking off regular lower body clothing?: A Lot 6 Click Score: 15   End of Session Equipment Utilized During Treatment: Gait belt;Rolling walker (2 wheels) Nurse Communication: Mobility status  Activity Tolerance: Patient tolerated treatment well Patient left: in bed;with bed alarm set;with family/visitor present  OT Visit Diagnosis: Unsteadiness on feet (R26.81);Muscle weakness (generalized) (M62.81)  Time: 8868-8847 OT Time Calculation (min): 21 min Charges:  OT General Charges $OT Visit: 1 Visit OT Evaluation $OT Eval Moderate Complexity: 1 Mod  Rikki BIRCH., MSOT,  OTR/L Acute Rehabilitation Services [phone] Secure Chat Preferred  [name] 11/25/2023, 1:16 PM

## 2023-11-25 NOTE — Progress Notes (Signed)
 Daily Progress Note Intern Pager: [phone]  Patient name: James Downs Medical record number: [id number] Date of birth: 09-Sep-1965 Age: 58 y.o. Gender: male  Primary Care Provider: Patient, No Pcp Per Consultants: GI, nephrology, general surgery, IR Code Status: Full  Pt Overview and Major Events to Date:  9/29: Admitted  Assessment and Plan:  James Downs is a 58 y.o.male who presented with AMS found to be 2/2 decompensated liver cirrhosis with hepatorenal syndrome, esophageal varices, and hepatic encephalopathy. Pertinent PMH/PSH includes NASH, esophageal varices, gastric ulcer, portal HTN, HLD, HTN, GAD, tobacco use, OSA, nephrolithiasis. Mentation improving. AKI stable. Increased abdominal fullness Assessment & Plan Decompensated cirrhosis (HCC) Hepatorenal syndrome (HCC) Esophageal varices in cirrhosis (HCC) Altered mental state Hemodynamically stable with MAP 90 this AM. Cr 2.09 > 2.12, GFR 36 > 35. Mental status improving. Increased abdominal fullness today with tenderness to palpation. 2 documented bowel movements in 24hrs so will increase lactulose  today. Low concern for SBP at this time given patient remains afebrile with normal WBC but will continue prophylaxis given presentation. Final Ucx with no growth - GI consulted appreciated recommendations  - Continue midodrine 15mg  TIDAC - continue to monitor MAPs and titrate to meet goal MAP 105-110. - Continue albumin  37.5 g daily - Increase Lactulose  20mg  from BID to TID.  - Continue Rifaximin 550 mg PO BID - S/p paracentesis with 7.9 L removed - cultures with NG @ 2 days - Repeat therapeutic paracentesis given increased abdominal fullness. Cannot start diuretics at this time 2/2 AKI. - Nephrology consulted, appreciate recs - Continue medications as above - Strict I/Os, daily weights - Continue to hold Lasix , Coreg , and spironolactone  for now given softer BP - Continue octreotide 200 mcg SQ TID - Continue  Rocephin  2 g IV Q24H ( 11/23/23) for SBP prophylaxis - AM CMP, PT/INR, CBC - PT/OT following - Continue delirium precautions - BCx pending but NG @ 2 days - UDS pending Black stool Ongoing for at least 1 week with 4-8 black stools per day. Previous colonoscopy with tubular adenomas and hemorrhoids. Esophageal varices seen on endoscopy in March 2025. FOBT positive. 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  Could also be related to uremic pruritus, or other infectious cause but less likely given sparing soles of feet and no apparent infectious prodrome. - Continue to monitor at this time and treating decompensated cirrhosis and hepatorenal syndrome Depression Concern for depression being a component of current patient presentation. Patient reports depressive state since losing job. Other stressors include family issues. After discussing with pharmacy, escitalopram is appropriate to begin at this time at a low dose and titrate up slowly as needed. - Start Escitalopram 5mg  daily  FEN/GI: Regular PPx: SCD Dispo:Home with home health pending clinical improvement .   Subjective:  Patient asleep but awoke to me speaking to wife at bedside. States he is feeling okay today. Discussed stressors which include family and states he has been struggling with his mood since losing his job. Patient is improving per wife at bedside.  Objective: Temp:  [98.4 F (36.9 C)-99.6 F (37.6 C)] 98.6 F (37 C) (10/01 0720) Pulse Rate:  [79-105] 79 (10/01 0720) Resp:  [16-19]  16 (10/01 0720) BP: (100-115)/(61-79) 115/79 (10/01 0720) SpO2:  [95 %-100 %] 98 % (10/01 0720)  Physical  Exam: General: Alert, male in NAD.  HEENT: No sign of trauma, EOM grossly intact. Neck: Supple, normal ROM Cardiovascular: RRR, no m/r/g appreciated. Pulmonary: Normal WOB. CTAB with no w/c/r present. Abdomen: Distended, TTP Extremities: Warm, 2+ edema in bilateral LE. Neurologic: No focal deficits Skin: Warm and dry.  Laboratory: Most recent CBC Lab Results  Component Value Date   WBC 10.3 11/25/2023   HGB 10.9 (L) 11/25/2023   HCT 32.4 (L) 11/25/2023   MCV 95.3 11/25/2023   PLT 130 (L) 11/25/2023   Most recent BMP    Latest Ref Rng & Units 11/25/2023    4:24 AM  BMP  Glucose 70 - 99 mg/dL 867   BUN 6 - 20 mg/dL 30   Creatinine 9.38 - 1.24 mg/dL 7.87   Sodium 864 - 854 mmol/L 133   Potassium 3.5 - 5.1 mmol/L 3.5   Chloride 98 - 111 mmol/L 106   CO2 22 - 32 mmol/L 19   Calcium 8.9 - 10.3 mg/dL 8.1     Other pertinent labs: Prothrombin 24.3, INR 2.1   [name], DO 11/25/2023, 7:58 AM  PGY-1, [facility] FPTS Intern pager: [phone], text pages welcome Secure chat group [facility] Teaching Service

## 2023-11-25 NOTE — Progress Notes (Signed)
 Reason for Consult:HRS Referring Physician: Weakness and altered mental status  James Downs is an 58 y.o. male.   HPI:   James Downs is a 58 year old male with history of non-alcoholic cirrhosis, HTN, HLD, and nephrolithiasis, presenting with weakness, abdominal pain, diarrhea, and several days of altered mental status.  Nephrology was consulted due to concern for hepatorenal syndrome.  Subjective:  Creatinine stable.  On midodrine 15 3 times daily.  Record UOP 350; stool .  Mental status significantly improved.  Likely near baseline.  Conversational.  Ambulating more.   Trend in Creatinine:  Objective:  BP 115/79 (BP Location: Left Arm)   Pulse 79   Temp 98.6 F (37 C) (Oral)   Resp 16   Ht 5' 7 (1.702 m)   Wt 81.6 kg   SpO2 98%   BMI 28.19 kg/m  Vitals:   11/24/23 1925 11/24/23 2330 11/25/23 0357 11/25/23 0720  BP: 110/72 109/72 110/70 115/79  Pulse: 85 89 83 79  Resp: 19 19 19 16   Temp: 98.6 F (37 C) 98.4 F (36.9 C) 98.7 F (37.1 C) 98.6 F (37 C)  TempSrc: Oral  Oral Oral  SpO2: 98% 98% 97% 98%  Weight:      Height:       Physical exam Chronically ill-appearing, lying in bed. Jaundiced skin RRR, no murmurs. Abdomen distended, tender. Awake, follows commands.  No baseline Extremities without edema, noted hyperkeratotic rash on lower extremities  Labs: Basic Metabolic Panel: Recent Labs  Lab 11/23/23 0903 11/23/23 0922 11/24/23 0546 11/25/23 0424  NA 135 137 138 133*  K 4.2 4.2 3.9 3.5  CL 102 106 106 106  CO2 18*  --  18* 19*  GLUCOSE 98 92 89 132*  BUN 30* 33* 29* 30*  CREATININE 2.23* 2.30* 2.09* 2.12*  ALBUMIN  2.5*  --  2.9* 2.6*  CALCIUM 8.6*  --  8.6* 8.1*   Liver Function Tests: Recent Labs  Lab 11/23/23 0903 11/24/23 0546 11/25/23 0424  AST 49* 34 28  ALT 23 17 14   ALKPHOS 59 41 37*  BILITOT 6.5* 5.0* 3.5*  PROT 7.2 5.9* 5.3*  ALBUMIN  2.5* 2.9* 2.6*   Recent Labs  Lab 11/23/23 0903  LIPASE 41    Recent Labs  Lab 11/23/23 1529  AMMONIA 76*   CBC: Recent Labs  Lab 11/23/23 0903 11/23/23 0922 11/23/23 1526 11/24/23 0803 11/24/23 1954 11/25/23 0424  WBC 13.0*  --  10.8* 8.6 9.9 10.3  NEUTROABS 10.3*  --   --   --   --   --   HGB 12.2*   < > 10.7* 9.5* 9.6* 10.9*  HCT 37.0*   < > 31.4* 28.1* 28.8* 32.4*  MCV 96.6  --  96.0 97.2 96.0 95.3  PLT 198  --  151 111* 129* 130*   < > = values in this interval not displayed.   PT/INR: @LABRCNTIP (inr:5) Cardiac Enzymes: )No results for input(s): CKTOTAL, CKMB, CKMBINDEX, TROPONINI in the last 168 hours. CBG: No results for input(s): GLUCAP in the last 168 hours.  Iron Studies: No results for input(s): IRON, TIBC, TRANSFERRIN, FERRITIN in the last 168 hours.  Xrays/Other Studies: CT HEAD WO CONTRAST ( ) Result Date: 11/23/2023 CLINICAL DATA:  Altered mental status. EXAM: CT HEAD WITHOUT CONTRAST TECHNIQUE: Contiguous axial images were obtained from the base of the skull through the vertex without intravenous contrast. RADIATION DOSE REDUCTION: This exam was performed according to the departmental dose-optimization program which includes automated exposure control, adjustment  of the mA and/or kV according to patient size and/or use of iterative reconstruction technique. COMPARISON:  August 11, 2023 FINDINGS: Brain: No evidence of acute infarction, hemorrhage, hydrocephalus, extra-axial collection or mass lesion/mass effect. Vascular: Moderate severity bilateral cavernous carotid artery calcification is noted. Skull: Normal. Negative for fracture or focal lesion. Sinuses/Orbits: No acute finding. Other: None. IMPRESSION: No acute intracranial abnormality. Electronically Signed   By: Suzen Dials M.D.   On: 11/23/2023 18:38   IR Paracentesis Result Date: 11/23/2023 INDICATION: History of chronic renal insufficiency, cirrhosis, recurrent ascites. Request for diagnostic and therapeutic paracentesis. EXAM: ULTRASOUND  GUIDED DIAGNOSTIC AND THERAPEUTIC PARACENTESIS MEDICATIONS: 6 mL 1% lidocaine  COMPLICATIONS: None immediate. PROCEDURE: Informed written consent was obtained from the patient after a discussion of the risks, benefits and alternatives to treatment. A timeout was performed prior to the initiation of the procedure. Initial ultrasound scanning demonstrates a large amount of ascites within the right lower abdominal quadrant. The right lower abdomen was prepped and draped in the usual sterile fashion. 1% lidocaine  was used for local anesthesia. Following this, a 19 gauge, 7-cm, Yueh catheter was introduced. An ultrasound image was saved for documentation purposes. The paracentesis was performed. The catheter was removed and a dressing was applied. The patient tolerated the procedure well without immediate post procedural complication. FINDINGS: A total of approximately 7.9 liters of hazy yellow fluid was removed. Samples were sent to the laboratory as requested by the clinical team. IMPRESSION: Successful ultrasound-guided paracentesis yielding 7.9 liters of peritoneal fluid. Performed by: Wyatt Pommier, PA-C Electronically Signed   By: Juliene Balder M.D.   On: 11/23/2023 15:48   CT ABDOMEN PELVIS W CONTRAST Result Date: 11/23/2023 CLINICAL DATA:  58 year old male with jaundice, sepsis, cirrhosis. Altered mental status, weakness, abdominal pain. EXAM: CT ABDOMEN AND PELVIS WITH CONTRAST TECHNIQUE: Multidetector CT imaging of the abdomen and pelvis was performed using the standard protocol following bolus administration of intravenous contrast. RADIATION DOSE REDUCTION: This exam was performed according to the departmental dose-optimization program which includes automated exposure control, adjustment of the mA and/or kV according to patient size and/or use of iterative reconstruction technique. CONTRAST:  75mL OMNIPAQUE  IOHEXOL  350 MG/ML SOLN COMPARISON:  CT Abdomen and Pelvis 05/19/2023. FINDINGS: Lower chest: No  pericardial or pleural effusion. Minor dependent lung base atelectasis. Hepatobiliary: Small, cirrhotic liver. Liver is highly nodular. No discrete mass. Contracted gallbladder with cholelithiasis. Pancreas: Negative. Spleen: Spleen size relatively normal, stable to mildly decreased in size since March. Adrenals/Urinary Tract: Extensive chronic nephrolithiasis. Kidneys appear nonobstructed despite the bilateral stones including in the left renal pelvis. Symmetric renal enhancement. But little to no renal contrast excretion on the delayed images (2 minute delay). Diminutive ureters. Negative adrenal glands. Diminutive urinary bladder, compressed from pelvic ascites and best seen on sagittal image 73. Stomach/Bowel: Large volume ascites, progressed since March. Circumferential large bowel wall thickening intermittently noted, especially at the hepatic flexure, splenic flexure, descending colon. Relatively decompressed stomach, duodenum and small bowel. No pneumoperitoneum. Vascular/Lymphatic: Aortoiliac calcified atherosclerosis. Normal caliber abdominal aorta. Portal venous system and other major vascular structures in the abdomen and pelvis appear patent. No lymphadenopathy. Reproductive: Relatively large volume ascites tracking in both inguinal canals is new (series 3, image 100), simple fluid density. Other: Progressed pelvic ascites, large volume. Simple fluid density. Musculoskeletal: Previous left lower thoracic laminectomies, stable. Lumbar spine degeneration with stable mild spondylolisthesis. No acute osseous abnormality identified. IMPRESSION: 1. Cirrhosis with a Large volume of Ascites, progressed since March, and fluid newly distending the bilateral  inguinal canals. 2. Little to no renal contrast excretion on the delayed images raising the possibility of renal insufficiency, hepatorenal syndrome. Underlying extensive nephrolithiasis but no evidence of obstructive uropathy. 3. Intermittent circumferential  large bowel wall thickening, consider Portal Colopathy versus Acute Colitis. Bowel obstruction. 4. Cholelithiasis.  Aortic Atherosclerosis (ICD10-I70.0). Electronically Signed   By: VEAR Hurst M.D.   On: 11/23/2023 11:20   DG Chest Port 1 View Result Date: 11/23/2023 CLINICAL DATA:  Questionable sepsis - evaluate for abnormality EXAM: PORTABLE CHEST - 1 VIEW COMPARISON:  May 19, 2023 FINDINGS: Markedly low lung volumes. No focal airspace consolidation, pleural effusion, or pneumothorax. No cardiomegaly. Aortic atherosclerosis. No acute fracture or destructive lesions. Multilevel thoracic osteophytosis. IMPRESSION: No acute cardiopulmonary abnormality. Electronically Signed   By: Rogelia Myers M.D.   On: 11/23/2023 10:17    Assessment/Plan: Acute Kidney Injury Concern for HRS  SCr stable, treating HRS with albumin  40 g daily, midodrine 15 TID and octreotide 200 mcg TID.  Unable to do terlipressin due to availability. -Strict I's and O's, daily weight, BMP. - Continue treatment as above.  Non Gap Metabolic acidosis: Mild: Improved Likely combination of AKI and diarrhea. Monitor for now  Evalene CHRISTELLA Lanes 11/25/2023, 8:37 AM

## 2023-11-25 NOTE — Assessment & Plan Note (Signed)
 Concern for depression being a component of current patient presentation. Patient reports depressive state since losing job. Other stressors include family issues. After discussing with pharmacy, escitalopram is appropriate to begin at this time at a low dose and titrate up slowly as needed. - Start Escitalopram 5mg  daily

## 2023-11-25 NOTE — Telephone Encounter (Signed)
 Pharmacy Patient Advocate Encounter  Received notification from [employer] that Prior Authorization for Xifaxan 550 mg has been APPROVED from 11/25/2023 to 11/24/2024. Ran test claim, Copay is $4.00. This test claim was processed through [facility]- copay amounts may vary at other pharmacies due to pharmacy/plan contracts, or as the patient moves through the different stages of their insurance plan.   PA #/Case ID/Reference #: [id number] KEY: [id number]

## 2023-11-25 NOTE — Assessment & Plan Note (Deleted)
 Normal WBC, no fever last 24 hrs - Pending body fluid culture but NG @ 2 days - Continue Rocephin  2 g IV Q24H ( 11/23/23) for SBP prophylaxis

## 2023-11-25 NOTE — Assessment & Plan Note (Signed)
 This is the leading diagnosis for bilateral lower extremity lesions, given presentation with BUN of 33, apparent chronic presentation, signs of excoriations.  Could also be related to uremic pruritus, or other infectious cause but less likely given sparing soles of feet and no apparent infectious prodrome. - Continue to monitor at this time and treating decompensated cirrhosis and hepatorenal syndrome

## 2023-11-25 NOTE — Assessment & Plan Note (Addendum)
 Hemodynamically stable with MAP 90 this AM. Cr 2.09 > 2.12, GFR 36 > 35. Mental status improving. Increased abdominal fullness today with tenderness to palpation. 2 documented bowel movements in 24hrs so will increase lactulose  today. Low concern for SBP at this time given patient remains afebrile with normal WBC but will continue prophylaxis given presentation. Final Ucx with no growth - GI consulted appreciated recommendations  - Continue midodrine 15mg  TIDAC - continue to monitor MAPs and titrate to meet goal MAP 105-110. - Continue albumin  37.5 g daily - Increase Lactulose  20mg  from BID to TID.  - Continue Rifaximin 550 mg PO BID - S/p paracentesis with 7.9 L removed - cultures with NG @ 2 days - Repeat therapeutic paracentesis given increased abdominal fullness. Cannot start diuretics at this time 2/2 AKI. - Nephrology consulted, appreciate recs - Continue medications as above - Strict I/Os, daily weights - Continue to hold Lasix , Coreg , and spironolactone  for now given softer BP - Continue octreotide 200 mcg SQ TID - Continue Rocephin  2 g IV Q24H ( 11/23/23) for SBP prophylaxis - AM CMP, PT/INR, CBC - PT/OT following - Continue delirium precautions - BCx pending but NG @ 2 days - UDS pending

## 2023-11-25 NOTE — Plan of Care (Signed)
 FMTS Interim Progress Note  S: Night time rounds. Doing well. Reports stomach pain has improved.   O: BP 116/66 (BP Location: Left Arm)   Pulse 73   Temp 98.6 F (37 C) (Oral)   Resp 18   Ht 5' 7 (1.702 m)   Wt 81.6 kg   SpO2 96%   BMI 28.19 kg/m   General: Chronically ill-appearing, no acute distress Cardio: RRR, no murmur on exam Pulm: clear, No increased work of breathing Abdomen: Soft, bowel sounds present, mildly distended, nontender  A/P: Decompensated MASH Cirrhosis:  Abdominal pain and distention improved s/p para  Has not had great appetite, dinner tray left uneaten  Goal MAP 105-110 per day team  Recent increase of midodrine to 20 mg TID starting tomorrow If MAPs begin to drop, will give an additional midodrine to avoid IV pressor support.  Per day team and pharmacy, can increase midodrine to 50 mg TID if needed.   Cleotilde Perkins, DO 11/25/2023, 9:33 PM PGY-3, [address] [facility] Family Medicine Service pager [phone]

## 2023-11-25 NOTE — Procedures (Signed)
 PROCEDURE SUMMARY:  Successful image-guided paracentesis from the left abdomen.  Yielded 8.3 liters of clear yellow fluid.  No immediate complications.  EBL: zero Patient tolerated well.   Specimen not sent for labs.  Please see imaging section of Epic for full dictation.   Alandis Bluemel B Samanthan Dugo NP 11/25/2023 3:29 PM

## 2023-11-25 NOTE — Telephone Encounter (Signed)
 Patient Product/process development scientist completed.    The patient is insured through [facility].     Ran test claim for Xifaxan 550 mg and Requires Prior Authorization    This test claim was processed through [employer]- copay amounts may vary at other pharmacies due to [employer], or as the patient moves through the different stages of their insurance plan.     James Downs, CPHT Pharmacy Technician III Certified Patient Advocate [facility] Pharmacy Patient Advocate Team Direct Number: [phone]  Fax: [phone]

## 2023-11-25 NOTE — Plan of Care (Signed)
 Went to see patient to discuss preferred point of contact. Patient asleep but wife at bedside. She stated she would prefer we not discuss the detail's of patient condition with him. Acknowledged her concern and reminded her that patient does have capacity and has final say in medical decisions. Awoke patient and he prefers daughter James Downs to be main point of contact.

## 2023-11-25 NOTE — Progress Notes (Addendum)
 Subjective: Patient seems to be more coherent today and seems to be responding appropriately to all the questions where he has been asked.  He has had another 8.3 L of ascitic fluid tapped today.  He denies having any abdominal pain nausea or vomiting. He had bowel movement earlier today.  Total bili 3.5 today improved from 5.0 on admission.  Objective: Vital signs in last 24 hours: Temp:  [98.4 F (36.9 C)-98.7 F (37.1 C)] 98.6 F (37 C) (10/01 1524) Pulse Rate:  [73-89] 73 (10/01 1524) Resp:  [16-19] 18 (10/01 1524) BP: (89-115)/(54-79) 99/68 (10/01 1524) SpO2:  [95 %-98 %] 95 % (10/01 1524) Last BM Date : 11/25/23  Intake/Output from previous day: 09/30 0701 - 10/01 0700 In: 478.6 [P.O.:250; IV Piggyback:228.6] Out: 1050 [Urine:350; Stool:700] Intake/Output this shift: Total I/O In: -  Out: 150 [Urine:150]  General appearance: cooperative, appears older than stated age, fatigued, icteric, no distress, and pale Resp: clear to auscultation bilaterally Cardio: regular rate and rhythm, S1, S2 normal, no murmur, click, rub or gallop GI: soft, non-tender; bowel sounds normal; no masses,  no organomegaly  Lab Results: Recent Labs    11/24/23 0803 11/24/23 1954 11/25/23 0424  WBC 8.6 9.9 10.3  HGB 9.5* 9.6* 10.9*  HCT 28.1* 28.8* 32.4*  PLT 111* 129* 130*   BMET Recent Labs    11/23/23 0903 11/23/23 0922 11/24/23 0546 11/25/23 0424  NA 135 137 138 133*  K 4.2 4.2 3.9 3.5  CL 102 106 106 106  CO2 18*  --  18* 19*  GLUCOSE 98 92 89 132*  BUN 30* 33* 29* 30*  CREATININE 2.23* 2.30* 2.09* 2.12*  CALCIUM 8.6*  --  8.6* 8.1*   LFT Recent Labs    11/25/23 0424  PROT 5.3*  ALBUMIN  2.6*  AST 28  ALT 14  ALKPHOS 37*  BILITOT 3.5*   PT/INR Recent Labs    11/24/23 0546 11/25/23 0424  LABPROT 24.0* 24.3*  INR 2.0* 2.1*    Studies/Results: IR Paracentesis Result Date: 11/25/2023 INDICATION: 58 year old male with history of decompensated liver cirrhosis,  ascites. Request for therapeutic paracentesis. EXAM: ULTRASOUND GUIDED THERAPEUTIC PARACENTESIS MEDICATIONS: 10mL 1% Lidocaine  with epi. COMPLICATIONS: None immediate. PROCEDURE: Informed written consent was obtained from the patient's daughter in law after a discussion of the risks, benefits and alternatives to treatment. A timeout was performed prior to the initiation of the procedure. Initial ultrasound scanning demonstrates a large amount of ascites within the left lower abdominal quadrant. The left lower abdomen was prepped and draped in the usual sterile fashion. 1% lidocaine  with epi was used for local anesthesia. Following this, a 5 Fr centesis catheter was introduced. An ultrasound image was saved for documentation purposes. The paracentesis was performed. The catheter was removed and a dressing was applied. The patient tolerated the procedure well without immediate post procedural complication. FINDINGS: A total of approximately 8.3 L of clear yellow fluid was removed. IMPRESSION: Successful ultrasound-guided paracentesis yielding 8.3 liters of peritoneal fluid. Performed by: Kristi Davenport, NP Electronically Signed   By: Wilkie Lent M.D.   On: 11/25/2023 15:55   CT HEAD WO CONTRAST ( ) Result Date: 11/23/2023 CLINICAL DATA:  Altered mental status. EXAM: CT HEAD WITHOUT CONTRAST TECHNIQUE: Contiguous axial images were obtained from the base of the skull through the vertex without intravenous contrast. RADIATION DOSE REDUCTION: This exam was performed according to the departmental dose-optimization program which includes automated exposure control, adjustment of the mA and/or kV according to  patient size and/or use of iterative reconstruction technique. COMPARISON:  August 11, 2023 FINDINGS: Brain: No evidence of acute infarction, hemorrhage, hydrocephalus, extra-axial collection or mass lesion/mass effect. Vascular: Moderate severity bilateral cavernous carotid artery calcification is noted.  Skull: Normal. Negative for fracture or focal lesion. Sinuses/Orbits: No acute finding. Other: None. IMPRESSION: No acute intracranial abnormality. Electronically Signed   By: Suzen Dials M.D.   On: 11/23/2023 18:38    Medications: I have reviewed the patient's current medications. Prior to Admission:  Medications Prior to Admission  Medication Sig Dispense Refill Last Dose/Taking   aspirin EC 81 MG tablet Take 81 mg by mouth daily. Swallow whole.   11/22/2023 Morning   carvedilol  (COREG ) 6.25 MG tablet Take 6.25 mg by mouth 2 (two) times daily. (Patient not taking: Reported on 11/23/2023)   Not Taking   furosemide  (LASIX ) 40 MG tablet Take 1 tablet (40 mg total) by mouth daily. (Patient not taking: Reported on 11/23/2023) 30 tablet 0 Not Taking   lactulose  (CHRONULAC ) 10 GM/15ML solution Take 30 mLs (20 g total) by mouth 2 (two) times daily. (Patient not taking: Reported on 11/23/2023) 1800 mL 0 Not Taking   spironolactone  (ALDACTONE ) 100 MG tablet Take 1 tablet (100 mg total) by mouth daily. 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 Patient will require help with his medications at the time of discharge due to financial limitations-social worker consult might be helpful. 2) Acute kidney injury superimposed on chronic kidney disease. 3) Rectal bleeding with portal colopathy noted on CT scan-improved. 4) Extensive nephrolithiasis without  any evidence of obstructive uropathy 5) Skin rash over lower legs. 6) Contracted gallbladder with cholelithiasis 7) Limited code; patient does not desire intubation if need arises in the future.  LOS: 2 days   Renaye Sous 11/25/2023, 4:26 PM

## 2023-11-25 NOTE — Progress Notes (Signed)
 Physical Therapy Treatment Patient Details Name: James Downs MRN: [id number] DOB: [birth year] Today's Date: 11/25/2023   History of Present Illness 58 year old male presenting 9/29 with weakness, abdominal pain, diarrhea, and several days of altered mental status. Dx Hepatic encephalopathy, Decompensated NASH cirrhosis, Ascites, AKI with CKD. PMHof non-alcoholic cirrhosis, HTN, HLD, and nephrolithiasis,   PT Comments  Pt improved in today's session by being able to ambulate 58ft with CGA and RW. Pt continues to need assist for bed mobility and to stand with RW. Wife was present during session and reported feeling comfortable providing 24/7 assist at home. Further session limited by fatigue with pt wanting to return to bed. Discussed performing LE exercises in supine and continuing to ambulate with staff with pt/wife verbalizing understanding. Updated recommendations to home with HHPT and 24/7 assist. Acute PT to continue to follow.    If plan is discharge home, recommend the following: A little help with bathing/dressing/bathroom;A lot of help with walking and/or transfers;Assistance with cooking/housework;Direct supervision/assist for medications management;Direct supervision/assist for financial management;Assist for transportation   Can travel by private vehicle     Yes  Equipment Recommendations  None recommended by PT       Precautions / Restrictions Precautions Precautions: Fall Recall of Precautions/Restrictions: Impaired Restrictions Weight Bearing Restrictions Per Provider Order: No     Mobility  Bed Mobility Overal bed mobility: Needs Assistance Bed Mobility: Rolling, Sidelying to Sit, Sit to Supine Rolling: Mod assist, Used rails Sidelying to sit: Mod assist, Used rails, HOB elevated   Sit to supine: Mod assist   General bed mobility comments: step by step cueing with ModA to roll and raise trunk. Increased time and effort to scoot hips forwards. ModA for LE  management to return to supine    Transfers Overall transfer level: Needs assistance Equipment used: Rolling walker (2 wheels) Transfers: Sit to/from Stand Sit to Stand: Min assist, +2 physical assistance  General transfer comment: MinA x2 for boost-up with cues for hand placement    Ambulation/Gait Ambulation/Gait assistance: Contact guard assist Gait Distance (Feet): 40 Feet Assistive device: Rolling walker (2 wheels) Gait Pattern/deviations: Step-through pattern, Decreased stride length, Trunk flexed Gait velocity: decr     General Gait Details: forward flexed trunk with short and steady steps. Pt limiting gait distance due to feeling weak. Cues for RW proximity and upright posture    Balance Overall balance assessment: Needs assistance Sitting-balance support: Feet supported, Bilateral upper extremity supported Sitting balance-Leahy Scale: Fair     Standing balance support: No upper extremity supported Standing balance-Leahy Scale: Fair Standing balance comment: able to stand statically at the sink with no UE support, RW for gait       Communication Communication Communication: No apparent difficulties  Cognition Arousal: Alert Behavior During Therapy: Flat affect   PT - Cognitive impairments: Sequencing, Problem solving    PT - Cognition Comments: increased time to process and respond to commands. Step by step sequencing for mobility Following commands: Impaired Following commands impaired: Follows one step commands with increased time, Follows multi-step commands inconsistently    Cueing Cueing Techniques: Verbal cues, Tactile cues, Visual cues     General Comments General comments (skin integrity, edema, etc.): Discussed performing SLR, ankle pumps, and hip ABD/ADD later in the day. Wife verbalized understanding      Pertinent Vitals/Pain Pain Assessment Pain Assessment: No/denies pain     PT Goals (current goals can now be found in the care plan  section) Acute Rehab PT Goals PT Goal  Formulation: With patient/family Time For Goal Achievement: 12/08/23 Potential to Achieve Goals: Good Progress towards PT goals: Progressing toward goals    Frequency    Min 2X/week           Co-evaluation   Reason for Co-Treatment: For patient/therapist safety;To address functional/ADL transfers PT goals addressed during session: Mobility/safety with mobility;Balance;Proper use of DME        AM-PAC PT 6 Clicks Mobility   Outcome Measure  Help needed turning from your back to your side while in a flat bed without using bedrails?: A Lot Help needed moving from lying on your back to sitting on the side of a flat bed without using bedrails?: A Lot Help needed moving to and from a bed to a chair (including a wheelchair)?: A Little Help needed standing up from a chair using your arms (e.g., wheelchair or bedside chair)?: A Little Help needed to walk in hospital room?: A Little Help needed climbing 3-5 steps with a railing? : A Lot 6 Click Score: 15    End of Session Equipment Utilized During Treatment: Gait belt Activity Tolerance: Patient limited by lethargy;Patient limited by fatigue Patient left: in bed;with call bell/phone within reach;with bed alarm set;with family/visitor present Nurse Communication: Mobility status PT Visit Diagnosis: Unsteadiness on feet (R26.81);Other abnormalities of gait and mobility (R26.89);Muscle weakness (generalized) (M62.81);Difficulty in walking, not elsewhere classified (R26.2);Other symptoms and signs involving the nervous system (R29.898)     Time: 8870-8846 PT Time Calculation (min) (ACUTE ONLY): 24 min  Charges:    $Gait Training: 8-22 mins PT General Charges $$ ACUTE PT VISIT: 1 Visit                    Kate ORN, PT, DPT Secure Chat Preferred  Rehab Office [phone]   Kate BRAVO [name] 11/25/2023, 12:24 PM

## 2023-11-25 NOTE — Assessment & Plan Note (Addendum)
 Ongoing for at least 1 week with 4-8 black stools per day. Previous colonoscopy with tubular adenomas and hemorrhoids. Esophageal varices seen on endoscopy in March 2025. FOBT positive. - Continue Octreotide 200 mcg SQ TID - Continue pantoprazole 40 mg PO BID - General surgery consulted and s/o c/o bowel obstruction VS underline volume overloaded on CTAP  - AM CBC

## 2023-11-26 DIAGNOSIS — K7290 Hepatic failure, unspecified without coma: Secondary | ICD-10-CM | POA: Diagnosis not present

## 2023-11-26 DIAGNOSIS — K7460 Unspecified cirrhosis of liver: Secondary | ICD-10-CM | POA: Diagnosis not present

## 2023-11-26 LAB — RAPID URINE DRUG SCREEN, HOSP PERFORMED
Amphetamines: NOT DETECTED
Barbiturates: NOT DETECTED
Benzodiazepines: POSITIVE — AB
Cocaine: NOT DETECTED
Opiates: NOT DETECTED
Tetrahydrocannabinol: NOT DETECTED

## 2023-11-26 LAB — COMPREHENSIVE METABOLIC PANEL WITH GFR
ALT: 13 U/L (ref 0–44)
AST: 25 U/L (ref 15–41)
Albumin: 2.5 g/dL — ABNORMAL LOW (ref 3.5–5.0)
Alkaline Phosphatase: 35 U/L — ABNORMAL LOW (ref 38–126)
Anion gap: 8 (ref 5–15)
BUN: 33 mg/dL — ABNORMAL HIGH (ref 6–20)
CO2: 19 mmol/L — ABNORMAL LOW (ref 22–32)
Calcium: 8.1 mg/dL — ABNORMAL LOW (ref 8.9–10.3)
Chloride: 104 mmol/L (ref 98–111)
Creatinine, Ser: 2.21 mg/dL — ABNORMAL HIGH (ref 0.61–1.24)
GFR, Estimated: 33 mL/min — ABNORMAL LOW (ref >60)
Glucose, Bld: 122 mg/dL — ABNORMAL HIGH (ref 70–99)
Potassium: 3.7 mmol/L (ref 3.5–5.1)
Sodium: 131 mmol/L — ABNORMAL LOW (ref 135–145)
Total Bilirubin: 3.9 mg/dL — ABNORMAL HIGH (ref 0.0–1.2)
Total Protein: 5 g/dL — ABNORMAL LOW (ref 6.5–8.1)

## 2023-11-26 LAB — CBC
HCT: 34.6 % — ABNORMAL LOW (ref 39.0–52.0)
Hemoglobin: 11.8 g/dL — ABNORMAL LOW (ref 13.0–17.0)
MCH: 32.3 pg (ref 26.0–34.0)
MCHC: 34.1 g/dL (ref 30.0–36.0)
MCV: 94.8 fL (ref 80.0–100.0)
Platelets: 154 K/uL (ref 150–400)
RBC: 3.65 MIL/uL — ABNORMAL LOW (ref 4.22–5.81)
RDW: 15.9 % — ABNORMAL HIGH (ref 11.5–15.5)
WBC: 13.7 K/uL — ABNORMAL HIGH (ref 4.0–10.5)
nRBC: 0 % (ref 0.0–0.2)

## 2023-11-26 LAB — BODY FLUID CULTURE W GRAM STAIN: Culture: NO GROWTH

## 2023-11-26 LAB — CREATININE, URINE, RANDOM: Creatinine, Urine: 254 mg/dL

## 2023-11-26 LAB — PROTIME-INR
INR: 2.1 — ABNORMAL HIGH (ref 0.8–1.2)
Prothrombin Time: 24.8 s — ABNORMAL HIGH (ref 11.4–15.2)

## 2023-11-26 LAB — SODIUM, URINE, RANDOM: Sodium, Ur: <30 mmol/L

## 2023-11-26 MED ORDER — MIDODRINE HCL 5 MG PO TABS
30.0000 mg | ORAL_TABLET | Freq: Three times a day (TID) | ORAL | Status: DC
  Administered 2023-11-27 – 2023-11-28 (×4): 30 mg via ORAL
  Filled 2023-11-26 (×4): qty 6

## 2023-11-26 MED ORDER — LACTULOSE 10 GM/15ML PO SOLN
30.0000 g | Freq: Three times a day (TID) | ORAL | Status: DC
  Administered 2023-11-26 – 2023-12-03 (×21): 30 g via ORAL
  Filled 2023-11-26 (×21): qty 60

## 2023-11-26 MED ORDER — MIDODRINE HCL 5 MG PO TABS
20.0000 mg | ORAL_TABLET | Freq: Three times a day (TID) | ORAL | Status: DC
Start: 2023-11-26 — End: 2023-11-26
  Administered 2023-11-26 (×2): 20 mg via ORAL
  Filled 2023-11-26 (×2): qty 4

## 2023-11-26 NOTE — Progress Notes (Signed)
 Daily Progress Note Intern Pager: [phone]  Patient name: James Downs Medical record number: [id number] Date of birth: 06/19/1965 Age: 58 y.o. Gender: male  Primary Care Provider: Patient, No Pcp Per Consultants: GI, nephrology, general surgery, IR Code Status: DNR/DNI  Pt Overview and Major Events to Date:  9/29: Admitted  Assessment and Plan: James Downs is a 58 y.o.male who presented with AMS found to be 2/2 decompensated liver cirrhosis with hepatorenal syndrome, esophageal varices, and hepatic encephalopathy. Pertinent PMH/PSH includes NASH, esophageal varices, gastric ulcer, portal HTN, HLD, HTN, GAD, tobacco use, OSA, nephrolithiasis. Mentation continues to improve. AKI not improved.  Assessment & Plan Decompensated cirrhosis (HCC) Hepatorenal syndrome (HCC) Esophageal varices in cirrhosis (HCC) Altered mental state Hemodynamically stable with MAP 82 this AM, room to increase midodrine to meet goals. Cr 2.12 > 2.21, GFR 35 > 33, AKI likely from hepatorenal syndrome. Mental status continues to improve. Abdominal fullness improved after paracentesis yesterday but still some TTP. Only 1 documented BM yesterday despite increasing lactulose  but may 2/2 decreased PO intake. Increased WBC (13.7) today but patient remains afebrile with NG on cultures so low concern for SBP and will d/c prophylaxis. - GI consulted, appreciate recommendations  - Increase Lactulose  to 30mg  TID - Care order to document % meals eaten - Continue Rifaximin 550 mg PO BID - S/p paracentesis with 7.9 L removed 9/29 and 8.3 L 10/1 - Repeat therapeutic paracentesis as needed given cannot start diuretics at this time 2/2 AKI. - Nephrology consulted, appreciate recs - Increase Midodrine to 20mg  TIDAC - Can titrate up q8hrs up to 50mg  TID to meet MAP goal of 105-110 - Continue Albumin  37.5 g daily - Continue Octreotide 200 mcg SQ TID - Strict I/Os, daily weights - Continue to hold Lasix , Coreg ,  and spironolactone  for now given softer BP - Discontinue Rocephin  2 g IV Q24H (11/23/23) for SBP prophylaxis - AM CMP, CBC, PT/INR - PT/OT following - Continue delirium precautions - BCx pending but NG @ 3 days Black stool Ongoing for at least 1 week with 4-8 black stools per day. Previous colonoscopy with tubular adenomas and hemorrhoids. Esophageal varices seen on endoscopy in March 2025. FOBT positive. 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  Could also be related to uremic pruritus, or other infectious cause but less likely given sparing soles of feet and no apparent infectious prodrome. - Continue to monitor at this time and treating decompensated cirrhosis and hepatorenal syndrome Depression Concern for depression being a component of current patient presentation. Patient reports depressive state since losing job. Other stressors include family issues. After discussing with pharmacy, escitalopram is appropriate to begin at this time at a low dose and titrate up slowly as needed. - Start Escitalopram 5mg  daily  FEN/GI: Regular PPx: SCD Dispo:Home with home health pending clinical improvement .    Subjective:  Patient asleep but awoke to his name. Wife at bedside. Patient states he still isn't feeling like himself but is better today compared to on admission. Clarified code status with patient who desires to be DNI/DNR.  Objective: Temp:  [98.6 F (37 C)-98.9 F (37.2 C)] 98.9 F (37.2 C) (10/02 0357) Pulse Rate:  [  70-75] 70 (10/02 0357) Resp:  [16-18] 18 (10/02 0357) BP: (89-119)/(54-71) 118/71 (10/02 0357) SpO2:  [95 %-97 %] 97 % (10/02 0357) Weight:   [84.9 kg] 84.9 kg (10/02 0431)  Physical Exam: General: Alert, male in NAD. More conversational today.  HEENT: No sign of trauma, EOM grossly intact. Neck: Supple, normal ROM Cardiovascular: RRR, no m/r/g appreciated. Pulmonary: Normal WOB. CTAB with no w/c/r present. Abdomen: Soft, non-distended, TTP. Extremities: Warm, 2+ edema in bilateral LE but seems improved from yesterday. Neurologic: No focal deficits Skin: Warm and dry. Lesions present on bilateral LE  Laboratory: Most recent CBC Lab Results  Component Value Date   WBC 13.7 (H) 11/26/2023   HGB 11.8 (L) 11/26/2023   HCT 34.6 (L) 11/26/2023   MCV 94.8 11/26/2023   PLT 154 11/26/2023   Most recent BMP    Latest Ref Rng & Units 11/26/2023    4:26 AM  BMP  Glucose 70 - 99 mg/dL 877   BUN 6 - 20 mg/dL 33   Creatinine 9.38 - 1.24 mg/dL 7.78   Sodium 864 - 854 mmol/L 131   Potassium 3.5 - 5.1 mmol/L 3.7   Chloride 98 - 111 mmol/L 104   CO2 22 - 32 mmol/L 19   Calcium 8.9 - 10.3 mg/dL 8.1     Other pertinent labs: Prothrombin time 24.8, INR 2.1  UDS positive for benzodiazepine  Laiylah Roettger, DO 11/26/2023, 7:25 AM  PGY-1, [facility] FPTS Intern pager: [phone], text pages welcome Secure chat group [facility] Teaching Service

## 2023-11-26 NOTE — Progress Notes (Signed)
 Subjective: Fatigued, no abdominal pain.  Objective: Vital signs in last 24 hours: Temp:  [97.9 F (36.6 C)-98.9 F (37.2 C)] 98.2 F (36.8 C) (10/02 1556) Pulse Rate:  [61-73] 61 (10/02 1556) Resp:  [14-18] 16 (10/02 1556) BP: (106-119)/(58-71) 118/58 (10/02 1556) SpO2:  [96 %-97 %] 97 % (10/02 1556) Weight:  [84.9 kg] 84.9 kg (10/02 0431) Last BM Date : 11/25/23  Intake/Output from previous day: 10/01 0701 - 10/02 0700 In: 360 [P.O.:360] Out: 350 [Urine:250; Stool:100] Intake/Output this shift: Total I/O In: 490 [P.O.:340; IV Piggyback:150] Out: 350 [Urine:350]  General appearance: slowed mentation Resp: clear to auscultation bilaterally Cardio: regular rate and rhythm GI: soft, + ascites Extremities: extremities normal, atraumatic, no cyanosis or edema  Lab Results: Recent Labs    11/24/23 1954 11/25/23 0424 11/26/23 0426  WBC 9.9 10.3 13.7*  HGB 9.6* 10.9* 11.8*  HCT 28.8* 32.4* 34.6*  PLT 129* 130* 154   BMET Recent Labs    11/24/23 0546 11/25/23 0424 11/26/23 0426  NA 138 133* 131*  K 3.9 3.5 3.7  CL 106 106 104  CO2 18* 19* 19*  GLUCOSE 89 132* 122*  BUN 29* 30* 33*  CREATININE 2.09* 2.12* 2.21*  CALCIUM 8.6* 8.1* 8.1*   LFT Recent Labs    11/26/23 0426  PROT 5.0*  ALBUMIN  2.5*  AST 25  ALT 13  ALKPHOS 35*  BILITOT 3.9*   PT/INR Recent Labs    11/25/23 0424 11/26/23 0426  LABPROT 24.3* 24.8*  INR 2.1* 2.1*   Hepatitis Panel No results for input(s): HEPBSAG, HCVAB, HEPAIGM, HEPBIGM in the last 72 hours. C-Diff No results for input(s): CDIFFTOX in the last 72 hours. Fecal Lactopherrin No results for input(s): FECLLACTOFRN in the last 72 hours.  Studies/Results: IR Paracentesis Result Date: 11/25/2023 INDICATION: 58 year old male with history of decompensated liver cirrhosis, ascites. Request for therapeutic paracentesis. EXAM: ULTRASOUND GUIDED THERAPEUTIC PARACENTESIS MEDICATIONS: 10mL 1% Lidocaine  with epi.  COMPLICATIONS: None immediate. PROCEDURE: Informed written consent was obtained from the patient's daughter in law after a discussion of the risks, benefits and alternatives to treatment. A timeout was performed prior to the initiation of the procedure. Initial ultrasound scanning demonstrates a large amount of ascites within the left lower abdominal quadrant. The left lower abdomen was prepped and draped in the usual sterile fashion. 1% lidocaine  with epi was used for local anesthesia. Following this, a 5 Fr centesis catheter was introduced. An ultrasound image was saved for documentation purposes. The paracentesis was performed. The catheter was removed and a dressing was applied. The patient tolerated the procedure well without immediate post procedural complication. FINDINGS: A total of approximately 8.3 L of clear yellow fluid was removed. IMPRESSION: Successful ultrasound-guided paracentesis yielding 8.3 liters of peritoneal fluid. Performed by: Kristi Davenport, NP Electronically Signed   By: Wilkie Lent M.D.   On: 11/25/2023 15:55    Medications: Scheduled:  escitalopram  5 mg Oral Daily   feeding supplement  237 mL Oral BID BM   lactulose   30 g Oral TID   midodrine  20 mg Oral TID WC   octreotide  200 mcg Subcutaneous TID   pantoprazole  40 mg Oral BID AC   rifaximin  550 mg Oral BID   Continuous:  albumin  human 37.5 g (11/26/23 0900)    Assessment/Plan: 1) Decompensated cirrhosis. 2) Ascites. 3) AKI - ? HRS versus prerenal. 4) Hepatic encephalopathy. 5) Hyponatremia.   He feels better after the paracentesis, but his creatinine continues  to increase.  It is difficult to know if this is truly HRS or a prerenal issue.  Regardless, his fluid balance is a significant issue.  I agree with family medicine in that his prognosis is very poor.  Even with maximal treatment for HRS he is not improving.  Even if he survives this hospitalization his overall health will continue to decline.   I agree with the Palliative Care consultation.  I also openly discussed the issue of his very poor prognosis with him and his wife.  His wife was tearful and I was able to help her get in contact with her daughter [name].  Plan: 1) Continue with rifaximin, lactulose , octreotide, midodrine, and albumin . 2) Palliative Care consultation. 3) Continue to monitor creatinine and sodium. 4) Maintain a low sodium diet with fluid restriction.  LOS: 3 days   Cortny Bambach D 11/26/2023, 5:14 PM

## 2023-11-26 NOTE — Assessment & Plan Note (Addendum)
 Hemodynamically stable with MAP 82 this AM, room to increase midodrine to meet goals. Cr 2.12 > 2.21, GFR 35 > 33, AKI likely from hepatorenal syndrome. Mental status continues to improve. Abdominal fullness improved after paracentesis yesterday but still some TTP. Only 1 documented BM yesterday despite increasing lactulose  but may 2/2 decreased PO intake. Increased WBC (13.7) today but patient remains afebrile with NG on cultures so low concern for SBP and will d/c prophylaxis. - GI consulted, appreciate recommendations  - Increase Lactulose  to 30mg  TID - Care order to document % meals eaten - Continue Rifaximin 550 mg PO BID - S/p paracentesis with 7.9 L removed 9/29 and 8.3 L 10/1 - Repeat therapeutic paracentesis as needed given cannot start diuretics at this time 2/2 AKI. - Nephrology consulted, appreciate recs - Increase Midodrine to 20mg  TIDAC - Can titrate up q8hrs up to 50mg  TID to meet MAP goal of 105-110 - Continue Albumin  37.5 g daily - Continue Octreotide 200 mcg SQ TID - Strict I/Os, daily weights - Continue to hold Lasix , Coreg , and spironolactone  for now given softer BP - Discontinue Rocephin  2 g IV Q24H (11/23/23) for SBP prophylaxis - AM CMP, CBC, PT/INR - PT/OT following - Continue delirium precautions - BCx pending but NG @ 3 days

## 2023-11-26 NOTE — Assessment & Plan Note (Signed)
 Ongoing for at least 1 week with 4-8 black stools per day. Previous colonoscopy with tubular adenomas and hemorrhoids. Esophageal varices seen on endoscopy in March 2025. FOBT positive. - Continue Octreotide 200 mcg SQ TID - Continue pantoprazole 40 mg PO BID - General surgery consulted and s/o c/o bowel obstruction VS underline volume overloaded on CTAP  - AM CBC

## 2023-11-26 NOTE — Plan of Care (Signed)

## 2023-11-26 NOTE — Plan of Care (Addendum)
 FMTS Interim Progress Note  Spoke with patient's daughter-in-law [name] who is the preferred point of contact for the family as specified by the patient and his wife.  Discussed patient's overall stable clinical status in the hospital and continued blood pressure management with the assistance of GI and nephrology.  Discussed patient's overall long-term poor prognosis in the setting of his liver failure.  Recommended palliative care consultation for continued support and resources long-term, [name] was agreeable.  Discussed possible placement options upon discharge including home care versus need for inpatient facility and communicated patient will likely need assistance over time.  [name] communicated that she is planning to discuss our conversation with the remainder of the family and compile a list of questions and the team will call back tomorrow to discuss those concerns and questions with her further.  Palliative care consult placed.   James Pagan, MD 11/26/2023, 3:46 PM PGY-3, [facility] Service pager [phone]

## 2023-11-26 NOTE — Progress Notes (Addendum)
 Reason for Consult:HRS Referring Physician: Weakness and altered mental status  James Downs is an 58 y.o. male.   HPI:   James Downs is a 58 year old male with history of non-alcoholic cirrhosis, HTN, HLD, and nephrolithiasis, presenting with weakness, abdominal pain, diarrhea, and several days of altered mental status.  Nephrology was consulted due to concern for hepatorenal syndrome.  Subjective:  He had additional 8.3 L of fluid removed yesterday, awake and alert. T. bili mildly up trended to 3.9, SCr plateaued at 2.20.   Objective:  BP 107/71 (BP Location: Left Arm)   Pulse 72   Temp 98.1 F (36.7 C) (Oral)   Resp 18   Ht 5' 7 (1.702 m)   Wt 84.9 kg   SpO2 96%   BMI 29.30 kg/m  Vitals:   11/26/23 0023 11/26/23 0357 11/26/23 0431 11/26/23 0729  BP: 119/70 118/71  107/71  Pulse: 71 70  72  Resp: 18 18  18   Temp: 98.6 F (37 C) 98.9 F (37.2 C)  98.1 F (36.7 C)  TempSrc: Oral Oral  Oral  SpO2: 96% 97%  96%  Weight:   84.9 kg   Height:       Physical exam Chronically ill-appearing, lying in bed. Jaundiced skin RRR, no murmurs. Abdomen distended, tender. Awake, follows commands.  No baseline Extremities without edema, noted hyperkeratotic rash on lower extremities  Labs: Basic Metabolic Panel: Recent Labs  Lab 11/23/23 0903 11/23/23 0922 11/24/23 0546 11/25/23 0424 11/26/23 0426  NA 135 137 138 133* 131*  K 4.2 4.2 3.9 3.5 3.7  CL 102 106 106 106 104  CO2 18*  --  18* 19* 19*  GLUCOSE 98 92 89 132* 122*  BUN 30* 33* 29* 30* 33*  CREATININE 2.23* 2.30* 2.09* 2.12* 2.21*  ALBUMIN  2.5*  --  2.9* 2.6* 2.5*  CALCIUM 8.6*  --  8.6* 8.1* 8.1*   Liver Function Tests: Recent Labs  Lab 11/24/23 0546 11/25/23 0424 11/26/23 0426  AST 34 28 25  ALT 17 14 13   ALKPHOS 41 37* 35*  BILITOT 5.0* 3.5* 3.9*  PROT 5.9* 5.3* 5.0*  ALBUMIN  2.9* 2.6* 2.5*   Recent Labs  Lab 11/23/23 0903  LIPASE 41   Recent Labs  Lab 11/23/23 1529   AMMONIA 76*   CBC: Recent Labs  Lab 11/23/23 0903 11/23/23 0922 11/24/23 0803 11/24/23 1954 11/25/23 0424 11/26/23 0426  WBC 13.0*   < > 8.6 9.9 10.3 13.7*  NEUTROABS 10.3*  --   --   --   --   --   HGB 12.2*   < > 9.5* 9.6* 10.9* 11.8*  HCT 37.0*   < > 28.1* 28.8* 32.4* 34.6*  MCV 96.6   < > 97.2 96.0 95.3 94.8  PLT 198   < > 111* 129* 130* 154   < > = values in this interval not displayed.   PT/INR: @LABRCNTIP (inr:5) Cardiac Enzymes: )No results for input(s): CKTOTAL, CKMB, CKMBINDEX, TROPONINI in the last 168 hours. CBG: No results for input(s): GLUCAP in the last 168 hours.  Iron Studies: No results for input(s): IRON, TIBC, TRANSFERRIN, FERRITIN in the last 168 hours.  Xrays/Other Studies: IR Paracentesis Result Date: 11/25/2023 INDICATION: 58 year old male with history of decompensated liver cirrhosis, ascites. Request for therapeutic paracentesis. EXAM: ULTRASOUND GUIDED THERAPEUTIC PARACENTESIS MEDICATIONS: 10mL 1% Lidocaine  with epi. COMPLICATIONS: None immediate. PROCEDURE: Informed written consent was obtained from the patient's daughter in law after a discussion of the risks, benefits  and alternatives to treatment. A timeout was performed prior to the initiation of the procedure. Initial ultrasound scanning demonstrates a large amount of ascites within the left lower abdominal quadrant. The left lower abdomen was prepped and draped in the usual sterile fashion. 1% lidocaine  with epi was used for local anesthesia. Following this, a 5 Fr centesis catheter was introduced. An ultrasound image was saved for documentation purposes. The paracentesis was performed. The catheter was removed and a dressing was applied. The patient tolerated the procedure well without immediate post procedural complication. FINDINGS: A total of approximately 8.3 L of clear yellow fluid was removed. IMPRESSION: Successful ultrasound-guided paracentesis yielding 8.3 liters of  peritoneal fluid. Performed by: Kristi Davenport, NP Electronically Signed   By: Wilkie Lent M.D.   On: 11/25/2023 15:55    Assessment/Plan: Acute Kidney Injury Concern for HRS  SCr has plateaued with albumin  40 mg daily, midodrine 15 mg TID and octreotide 200 mg TID.  I query if all of this is HRS versus other etiologies such as abdominal compartment syndrome.  For now we will continue albumin  and vasoconstrictors. -Strict I's and O's, daily weight, BMP. - Increase Midodrine to 20 mg TID - Continue treatment as above.  Non Gap Metabolic acidosis: Mild: Improved Likely combination of AKI and diarrhea. Monitor for now  Missy Sandhoff 11/26/2023, 10:50 AM

## 2023-11-26 NOTE — Assessment & Plan Note (Signed)
 Concern for depression being a component of current patient presentation. Patient reports depressive state since losing job. Other stressors include family issues. After discussing with pharmacy, escitalopram is appropriate to begin at this time at a low dose and titrate up slowly as needed. - Start Escitalopram 5mg  daily

## 2023-11-26 NOTE — TOC Progression Note (Signed)
 Transition of Care [facility]) - Progression Note    Patient Details  Name: SHLOMIE ROMIG MRN: [id number] Date of Birth: January 01, 1966  [facility] CM/SW Contact  Andrez JULIANNA George, RN Phone Number: 11/26/2023, 1:55 PM  Clinical Narrative:     IP care management following for d/c needs. Due to his insurance home health may not be an option. Will continue to follow therapy recommendations and see how pt progresses closer to discharge.   Expected Discharge Plan: Home w Home Health Services Barriers to Discharge: Continued Medical Work up               Expected Discharge Plan and Services   Discharge Planning Services: CM Consult   Living arrangements for the past 2 months: Single Family Home                                       Social Drivers of Health (SDOH) Interventions SDOH Screenings   Food Insecurity: No Food Insecurity (11/24/2023)  Housing: Unknown (11/24/2023)  Transportation Needs: No Transportation Needs (11/24/2023)  Utilities: Not At Risk (11/24/2023)  Tobacco Use: High Risk (08/11/2023)    Readmission Risk Interventions     No data to display

## 2023-11-26 NOTE — Assessment & Plan Note (Signed)
 This is the leading diagnosis for bilateral lower extremity lesions, given presentation with BUN of 33, apparent chronic presentation, signs of excoriations.  Could also be related to uremic pruritus, or other infectious cause but less likely given sparing soles of feet and no apparent infectious prodrome. - Continue to monitor at this time and treating decompensated cirrhosis and hepatorenal syndrome

## 2023-11-26 NOTE — Progress Notes (Signed)
 Occupational Therapy Treatment Patient Details Name: James Downs MRN: [id number] DOB: June 21, 1965 Today's Date: 11/26/2023   History of present illness 58 year old male presenting 9/29 with weakness, abdominal pain, diarrhea, and several days of altered mental status. Dx Hepatic encephalopathy, Decompensated NASH cirrhosis, Ascites, AKI with CKD. PMHof non-alcoholic cirrhosis, HTN, HLD, and nephrolithiasis,   OT comments  Pt greeted in supine, agreeable for OT session. Wife at bedside. Pt progressed to bathroom mobility this date, needing no more than CGA for functional ambulation via RW. Completed toilet transfer with min A for controlled descent, used grab bars to assist. Toileting task simulation with CGA for stability. Overall, limited by reduced volition and activity tolerance, but was participatory. Re-educated on general safety/falls prevention. OT to continue to follow acutely.       If plan is discharge home, recommend the following:  A little help with walking and/or transfers;A little help with bathing/dressing/bathroom;Assistance with cooking/housework;Direct supervision/assist for medications management;Supervision due to cognitive status;Direct supervision/assist for financial management;Assist for transportation   Equipment Recommendations  None recommended by OT    Recommendations for Other Services      Precautions / Restrictions Precautions Precautions: Fall Recall of Precautions/Restrictions: Impaired Precaution/Restrictions Comments: delirium prevention Restrictions Weight Bearing Restrictions Per Provider Order: No       Mobility Bed Mobility Overal bed mobility: Needs Assistance Bed Mobility: Supine to Sit, Sit to Supine     Supine to sit: Min assist Sit to supine: Contact guard assist   General bed mobility comments: HOB mostly flat, no use of rails to simulate home environment; needs A for trunk elevation to come into sitting at EOB     Transfers Overall transfer level: Needs assistance Equipment used: Rolling walker (2 wheels) Transfers: Sit to/from Stand Sit to Stand: Min assist           General transfer comment: Multimodal cues for hand placement & powering up from EOB     Balance Overall balance assessment: Needs assistance Sitting-balance support: No upper extremity supported, Feet supported Sitting balance-Leahy Scale: Good Sitting balance - Comments: seated EOB   Standing balance support: Bilateral upper extremity supported, During functional activity, Reliant on assistive device for balance Standing balance-Leahy Scale: Fair Standing balance comment: CGA for stability, reliant on RW during mobility                           ADL either performed or assessed with clinical judgement   ADL Overall ADL's : Needs assistance/impaired                         Toilet Transfer: Minimal assistance;Ambulation;Regular Toilet;Rolling walker (2 wheels)     Toileting - Clothing Manipulation Details (indicate cue type and reason): male external purewick and flexiseal intact; simulated clothing mgmt with CGA for stability            Extremity/Trunk Assessment              Vision       Perception     Praxis     Communication Communication Communication: No apparent difficulties   Cognition Arousal: Alert Behavior During Therapy: Flat affect Cognition: Cognition impaired     Awareness: Online awareness impaired Memory impairment (select all impairments): Short-term memory Attention impairment (select first level of impairment): Selective attention Executive functioning impairment (select all impairments): Organization, Sequencing, Problem solving OT - Cognition Comments: reduced volition, although overall participatory with OT  Following commands: Impaired Following commands impaired: Follows multi-step commands with increased time      Cueing    Cueing Techniques: Verbal cues, Tactile cues, Visual cues  Exercises      Shoulder Instructions       General Comments BP seated EOB d/t transient dizziness: 109/77 (87); supportive wife present throughout session    Pertinent Vitals/ Pain       Pain Assessment Pain Assessment: No/denies pain Breathing: normal  Home Living                                          Prior Functioning/Environment              Frequency  Min 2X/week        Progress Toward Goals  OT Goals(current goals can now be found in the care plan section)  Progress towards OT goals: Progressing toward goals     Plan      Co-evaluation                 AM-PAC OT 6 Clicks Daily Activity     Outcome Measure   Help from another person eating meals?: None Help from another person taking care of personal grooming?: A Little Help from another person toileting, which includes using toliet, bedpan, or urinal?: A Lot Help from another person bathing (including washing, rinsing, drying)?: A Lot Help from another person to put on and taking off regular upper body clothing?: None Help from another person to put on and taking off regular lower body clothing?: A Lot 6 Click Score: 17    End of Session Equipment Utilized During Treatment: Gait belt;Rolling walker (2 wheels)  OT Visit Diagnosis: Unsteadiness on feet (R26.81);Muscle weakness (generalized) (M62.81)   Activity Tolerance Patient tolerated treatment well   Patient Left in bed;with bed alarm set;with family/visitor present;with call bell/phone within reach   Nurse Communication          Time: 8599-8573 OT Time Calculation (min): 26 min  Charges: OT General Charges $OT Visit: 1 Visit OT Treatments $Self Care/Home Management : 23-37 mins  Ritchie Klee D., MSOT, OTR/L Acute Rehabilitation Services [phone] Secure Chat Preferred  [name] 11/26/2023, 3:36 PM

## 2023-11-27 DIAGNOSIS — K7290 Hepatic failure, unspecified without coma: Secondary | ICD-10-CM | POA: Diagnosis not present

## 2023-11-27 DIAGNOSIS — Z515 Encounter for palliative care: Secondary | ICD-10-CM | POA: Diagnosis not present

## 2023-11-27 DIAGNOSIS — K7682 Hepatic encephalopathy: Secondary | ICD-10-CM | POA: Diagnosis not present

## 2023-11-27 DIAGNOSIS — N179 Acute kidney failure, unspecified: Secondary | ICD-10-CM | POA: Diagnosis not present

## 2023-11-27 DIAGNOSIS — K7460 Unspecified cirrhosis of liver: Secondary | ICD-10-CM | POA: Diagnosis not present

## 2023-11-27 LAB — CBC
HCT: 33 % — ABNORMAL LOW (ref 39.0–52.0)
Hemoglobin: 11 g/dL — ABNORMAL LOW (ref 13.0–17.0)
MCH: 31.9 pg (ref 26.0–34.0)
MCHC: 33.3 g/dL (ref 30.0–36.0)
MCV: 95.7 fL (ref 80.0–100.0)
Platelets: 114 K/uL — ABNORMAL LOW (ref 150–400)
RBC: 3.45 MIL/uL — ABNORMAL LOW (ref 4.22–5.81)
RDW: 15.6 % — ABNORMAL HIGH (ref 11.5–15.5)
WBC: 10.5 K/uL (ref 4.0–10.5)
nRBC: 0 % (ref 0.0–0.2)

## 2023-11-27 LAB — COMPREHENSIVE METABOLIC PANEL WITH GFR
ALT: 13 U/L (ref 0–44)
AST: 24 U/L (ref 15–41)
Albumin: 2.7 g/dL — ABNORMAL LOW (ref 3.5–5.0)
Alkaline Phosphatase: 30 U/L — ABNORMAL LOW (ref 38–126)
Anion gap: 9 (ref 5–15)
BUN: 34 mg/dL — ABNORMAL HIGH (ref 6–20)
CO2: 20 mmol/L — ABNORMAL LOW (ref 22–32)
Calcium: 8.3 mg/dL — ABNORMAL LOW (ref 8.9–10.3)
Chloride: 103 mmol/L (ref 98–111)
Creatinine, Ser: 1.99 mg/dL — ABNORMAL HIGH (ref 0.61–1.24)
GFR, Estimated: 38 mL/min — ABNORMAL LOW (ref >60)
Glucose, Bld: 141 mg/dL — ABNORMAL HIGH (ref 70–99)
Potassium: 3.8 mmol/L (ref 3.5–5.1)
Sodium: 132 mmol/L — ABNORMAL LOW (ref 135–145)
Total Bilirubin: 3.9 mg/dL — ABNORMAL HIGH (ref 0.0–1.2)
Total Protein: 5 g/dL — ABNORMAL LOW (ref 6.5–8.1)

## 2023-11-27 LAB — PROTIME-INR
INR: 2.2 — ABNORMAL HIGH (ref 0.8–1.2)
Prothrombin Time: 25.7 s — ABNORMAL HIGH (ref 11.4–15.2)

## 2023-11-27 LAB — UREA NITROGEN, URINE: Urea Nitrogen, Ur: 904 mg/dL

## 2023-11-27 NOTE — Consult Note (Signed)
 Consultation Note Date: 11/27/2023   Patient Name: James Downs  DOB: [birth year]  MRN: [id number]  Age / Sex: 58 y.o., male  PCP: Patient, No Pcp Per Referring Physician: Madelon Donald CHRISTELLA, DO  Reason for Consultation: Establishing goals of care  HPI/Patient Profile: 58 y.o. male  with past medical history of non-alcoholic cirrhosis, esophageal varices, gastric ulcer, portal HTN, HLD, nephrolithiasis  admitted on 11/23/2023 with 2-3 days of weakness, abdominal pain, and diarrhea.   11/23/2023: Paracentesis done, 7.9 L  of ascitic fluid removed 11/25/2023: Paracentesis done, 8.3 L of ascitic fluid removed   PMT has been consulted to assist with goals of care conversation. Patient/Family face treatment option decisions, advanced directive decisions and anticipatory care needs.   Family face treatment option decision, advance directive decisions and anticipatory care needs.   Clinical Assessment and Goals of Care:  I have reviewed medical records including EPIC notes, labs and imaging, assessed the patient and then met with patient and his wife Scotland Korver  to discuss diagnosis prognosis, GOC, EOL wishes, disposition and options.  I introduced Palliative Medicine as specialized medical care for people living with serious illness. It focuses on providing relief from the symptoms and stress of a serious illness. The goal is to improve quality of life for both the patient and the family.  Created space and opportunity for family to explore thoughts and feelings regarding patient's current medical condition.   The patient was observed resting comfortably in bed, appearing chronically ill but without signs of acute distress. He was alert and oriented x4, able to communicate effectively and express his needs. His wife, Deagan Sevin, was present at the bedside. I reviewed the patient's current medical condition and treatment plan with both the patient and his  wife. They demonstrated a clear understanding of his acute illness, underlying chronic conditions, and the reasons for his hospitalization. The patient acknowledged that he is hospitalized due to liver cirrhosis, while his wife reported that he had experienced increasing weakness and altered mental status for 3-4 days prior to admission.  We discussed the seriousness of his decompensated liver cirrhosis and the associated symptom burden, including fatigue, dyspnea, diarrhea, and intermittent confusion. I explained the potential progression of his disease, including the risk of multi-organ involvement and continued decline despite ongoing treatment. When asked about paracentesis, I explained that due to the advanced stage of his cirrhosis, this may become a recurring need. I also provided a detailed explanation of the current interventions and the rationale behind each component of his care plan.  We explored his goals of care, including his values and preferences. The patient expressed that he would not want resuscitative efforts in the event of cardiopulmonary arrest. He acknowledged the severity of his illness but stated, "I want to fight, best as I can," expressing hope for improvement and a desire to return home with his wife. I shared concerns about the challenges of managing his symptoms at home as the disease progresses and discussed the likely need for placement in a facility that can meet his care needs. He was accepting of this possibility. We also reviewed the differences between aggressive medical interventions and comfort-focused care in the context of his goals. Outpatient Palliative and Hospice Care services were introduced and offered.  Following this discussion, I contacted Ulrick Methot (daughter-in-law) to coordinate a family meeting to further discuss goals of care. We agreed to meet today, 11/27/2023, at 3:30 PM in the patient's room.  At 1530, a family meeting was  held in the  patient's room. Present were the patient, his wife [name], daughters-in-law [name] and [place], and sons Arthea and Toribio. I explored their understanding of the patient's current medical condition. The family expressed awareness of the seriousness of his illness and the limited available medical interventions. They shared that, based on their understanding, the patient's time may be limited. The conversation was emotional, with several family members tearful as they processed the situation. Despite the emotional weight, they demonstrated realistic expectations and a desire to honor the patient's wishes.  We discussed the progression of the patient's illness, multi-organ failure, including the likelihood of needing more frequent paracentesis, which may result in increased hospital visits and symptom burden. In light of this, we explored what quality of life means to the patient, whether to prioritize comfort or continue life-prolonging measures, recognizing that the overall outcome may not change. Both the patient and family appeared to understand this perspective. At present, the patient wishes to continue with the current treatment plan, focusing on treating what is treatable and remaining hopeful for improvement. He stated, "miracles happen." I reviewed available treatment options, including the philosophy of comfort-focused and hospice care. The patient and family confirmed they are not yet ready to pursue hospice or comfort-based care.  The patient expressed interest in completing an advance directive. I offered the assistance of our Chaplain to support the creation of this document. A brief overview of the advance directive was provided, and the patient was given time to review it.  Additionally, I discussed the MOST form with the patient and family members. Form completed.  A hard copy of the MOST form was placed in the patient's chart for reference.  Regarding disposition after hospitalization,  the patient expressed a desire to return home, even if only briefly. However, the family voiced concerns about their ability to support him at home, citing work schedules and anticipated increased needs for assistance with ADLs and overall care. They expressed interest in discharging the patient to a skilled nursing facility (SNF) for 24/7 care and supervision.  Questions and concerns were addressed.  Hard Choices booklet left for review. The family was encouraged to call with questions or concerns.  PMT will continue to support holistically.  Social History: Patient lives home with wife. They've been married 40 years. He has two children living close-by. 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 He loves watching television on his free time.   Functional and Nutritional State: He is ambulatory at baseline, reports progressive weakness over the last few weeks. He also reports unintentional weight loss (unable to account how many pounds lost) due to decrease in appetite.   Palliative Symptoms: Generalized weakness, shortness of breath, intermittent confusion, diarrhea, abdominal discomfort due to ascites.   Advance Directives: Patient shares that he currently does not have an advance directive document and wishes to complete one. I shared that we can assist with creation of advance document through our chaplain services.   Code Status: DNR-Limited   PATIENT Mahkai Fangman (wife)    SUMMARY OF RECOMMENDATIONS   Code Status: Maintain DNR/DNI status MOST form completed Goal of care currently is medical stabilization, allowing time for outcomes Discussed the importance of continued conversation with family and the medical providers regarding overall plan of care and treatment options, ensuring decisions are within the context of the patient's values and GOCs.  Spiritual care consult to assist with advance directive creation (Consult placed 11/27/2023) Continue to provide psycho-social and  emotional support to patient and family  Palliative medicine team will continue to follow.   Symptom Management: Per Primary team Palliative medicine is available to assist as needed.    Palliative Prophylaxis:  Aspiration, Bowel Regimen, Frequent Pain Assessment, and Turn Reposition  Psycho-social/Spiritual:  Desire for further Chaplaincy support:yes  Prognosis:  His prognosis is very poor given the high disease burden.    Discharge Planning: Family expressed interest with discharging patient to SNF versus home with home health.      Primary Diagnoses: Present on Admission:  Altered mental state  Decompensated cirrhosis (HCC)  Hepatorenal syndrome (HCC)   Physical Exam: General: Alert, chronically-ill appearing HEENT: No sign of trauma, EOM grossly intact. Cardiovascular: RRR, HR 75 BPM Pulmonary: Normal WOB. CTAB with no w/c/r present. Abdomen: Distended, TTP Extremities: Warm, 2+ edema in bilateral LE. Neurologic: No focal deficits Skin: Warm and dry.   Vital Signs: BP 118/68 (BP Location: Right Arm)   Pulse 75   Temp 98.1 F (36.7 C) (Oral)   Resp 18   Ht 5' 7 (1.702 m)   Wt 85 kg   SpO2 97%   BMI 29.34 kg/m  Pain Scale: 0-10 POSS *See Group Information*: S-Acceptable,Sleep, easy to arouse Pain Score: 0-No pain   SpO2: SpO2: 97 % O2 Device:SpO2: 97 % O2 Flow Rate: .    Palliative Assessment/Data: 40% to 50%.    Total time: I spent 90 minutes in the care of the patient today in the above activities and documenting the encounter.   Detailed review of medical records (labs, imaging, vital signs), medically appropriate exam, discussed with treatment team, counseling and education to patient, family, & staff, documenting clinical information, coordination of care.     Kathlyne JULIANNA Tracie Mickey, NP  Palliative Medicine Team Team phone # [phone]  Thank you for allowing the Palliative Medicine Team to assist in the care of this patient. Please  utilize secure chat with additional questions, if there is no response within 30 minutes please call the above phone number.  Palliative Medicine Team providers are available by phone from 7am to 7pm daily and can be reached through the team cell phone.  Should this patient require assistance outside of these hours, please call the patient's attending physician.

## 2023-11-27 NOTE — Plan of Care (Signed)

## 2023-11-27 NOTE — Plan of Care (Signed)
 Spoke with patient's daughter-in-law [name] who is the preferred point of contact for the family as specified by the patient and his wife.  Confirmed patient's name and date of birth  Discussed patient's clinical status today and ongoing BP management as well as need for possible paracentesis which would be the 3rd this week. Informed patient palliative care consult was placed yesterday and they will see the patient soon. Concerns about costs if needing inpatient facility which I explained care management can help with. Answered all questions and encouraged family to compile a list of questions and team can call back tomorrow if needed.  Darren Jernigan, DO [facility], PGY-1 11/27/2023 12:56 PM Service pager [phone]

## 2023-11-27 NOTE — Progress Notes (Signed)
 Subjective: No complaints.  Objective: Vital signs in last 24 hours: Temp:  [98 F (36.7 C)-98.8 F (37.1 C)] 98.3 F (36.8 C) (10/03 1104) Pulse Rate:  [61-75] 64 (10/03 1104) Resp:  [14-20] 20 (10/03 1104) BP: (112-118)/(58-79) 118/75 (10/03 1104) SpO2:  [97 %-100 %] 97 % (10/03 1104) Weight:  [85 kg] 85 kg (10/03 0627) Last BM Date : 11/25/23  Intake/Output from previous day: 10/02 0701 - 10/03 0700 In: 610 [P.O.:460; IV Piggyback:150] Out: 650 [Urine:350; Stool:300] Intake/Output this shift: Total I/O In: 88.9 [IV Piggyback:88.9] Out: -   General appearance: alert and no distress GI: distended with ascites  Lab Results: Recent Labs    11/25/23 0424 11/26/23 0426 11/27/23 0205  WBC 10.3 13.7* 10.5  HGB 10.9* 11.8* 11.0*  HCT 32.4* 34.6* 33.0*  PLT 130* 154 114*   BMET Recent Labs    11/25/23 0424 11/26/23 0426 11/27/23 0205  NA 133* 131* 132*  K 3.5 3.7 3.8  CL 106 104 103  CO2 19* 19* 20*  GLUCOSE 132* 122* 141*  BUN 30* 33* 34*  CREATININE 2.12* 2.21* 1.99*  CALCIUM 8.1* 8.1* 8.3*   LFT Recent Labs    11/27/23 0205  PROT 5.0*  ALBUMIN  2.7*  AST 24  ALT 13  ALKPHOS 30*  BILITOT 3.9*   PT/INR Recent Labs    11/26/23 0426 11/27/23 0205  LABPROT 24.8* 25.7*  INR 2.1* 2.2*   Hepatitis Panel No results for input(s): HEPBSAG, HCVAB, HEPAIGM, HEPBIGM in the last 72 hours. C-Diff No results for input(s): CDIFFTOX in the last 72 hours. Fecal Lactopherrin No results for input(s): FECLLACTOFRN in the last 72 hours.  Studies/Results: IR Paracentesis Result Date: 11/25/2023 INDICATION: 58 year old male with history of decompensated liver cirrhosis, ascites. Request for therapeutic paracentesis. EXAM: ULTRASOUND GUIDED THERAPEUTIC PARACENTESIS MEDICATIONS: 10mL 1% Lidocaine  with epi. COMPLICATIONS: None immediate. PROCEDURE: Informed written consent was obtained from the patient's daughter in law after a discussion of the risks,  benefits and alternatives to treatment. A timeout was performed prior to the initiation of the procedure. Initial ultrasound scanning demonstrates a large amount of ascites within the left lower abdominal quadrant. The left lower abdomen was prepped and draped in the usual sterile fashion. 1% lidocaine  with epi was used for local anesthesia. Following this, a 5 Fr centesis catheter was introduced. An ultrasound image was saved for documentation purposes. The paracentesis was performed. The catheter was removed and a dressing was applied. The patient tolerated the procedure well without immediate post procedural complication. FINDINGS: A total of approximately 8.3 L of clear yellow fluid was removed. IMPRESSION: Successful ultrasound-guided paracentesis yielding 8.3 liters of peritoneal fluid. Performed by: Kristi Davenport, NP Electronically Signed   By: Wilkie Lent M.D.   On: 11/25/2023 15:55    Medications: Scheduled:  escitalopram  5 mg Oral Daily   feeding supplement  237 mL Oral BID BM   lactulose   30 g Oral TID   midodrine  30 mg Oral TID WC   octreotide  200 mcg Subcutaneous TID   pantoprazole  40 mg Oral BID AC   rifaximin  550 mg Oral BID   Continuous:  albumin  human 37.5 g (11/27/23 0939)    Assessment/Plan: 1) Ascites. 2) Hepatic encephalopathy - resolved. 3) HRS versus AKI. 4) Decompensated cirrhosis - MELD 3.0 - 30.   His mentation is much better.  The renal function also improved, but his midodrine was increased to 30 mg TID.  Today they are going  to meet with Palliative Care.  The family asked about transplantation.  The question was not unreasonable.  If he survives this hospitalization a referral will be made, but a major point of contention is is medical noncompliance.  It is questionable if he has the social support to go through with the transplantation.  Additionally, it is not known if his Medicaid will be accepted.  Plan: 1) Continue with the current  treatment. 2) Await Palliative Care assessment.  LOS: 4 days   Ciaira Natividad D 11/27/2023, 2:53 PM

## 2023-11-27 NOTE — Assessment & Plan Note (Signed)
 Concern for depression being a component of current patient presentation. Patient reports depressive state since losing job. Other stressors include family issues. After discussing with pharmacy, escitalopram is appropriate to begin at this time at a low dose and titrate up slowly as needed. - Continue Escitalopram 5mg  daily

## 2023-11-27 NOTE — Assessment & Plan Note (Signed)
 Hemodynamically stable with MAP 82 this AM, room to increase midodrine to meet goals. Cr 2.21 > 1.99, GFR 33 > 38, AKI likely from hepatorenal syndrome, improving. Mental status continues to improve. Increased abdominal fullness and patient will likely require a 3rd paracentesis over the weekend but no discomfort at time of exam. Only 1 documented BM in 24hrs despite increasing lactulose  but may 2/2 decreased PO intake. Also possible fecal managmeent system is only assessed once daily, will speak with nursing to increase checks. WBC normal today at 10.5. Given overall long-term poor prognosis in setting of liver failure, consult for palliative care placed yesterday after discussion with family. - GI consulted, appreciate recommendations  - Continue Lactulose  to 30mg  TID - Timely checks to follow BM - Continue Rifaximin 550 mg PO BID - S/p paracentesis with 7.9 L removed 9/29 and 8.3 L 10/1 - Repeat therapeutic paracentesis as needed given cannot start diuretics at this time 2/2 AKI. - Nephrology consulted, appreciate recs - Increase Midodrine to 30mg  TIDAC - Can titrate up q8hrs up to 50mg  TID to meet MAP goal of 105-110 - Continue Albumin  37.5 g daily - Continue Octreotide 200 mcg SQ TID - Strict I/Os, daily weights - Continue to hold Lasix , Coreg , and spironolactone  for now given softer BP - Discontinued Rocephin  2 g IV Q24H 10/2 for SBP prophylaxis - AM CMP, CBC, PT/INR - PT/OT following - Continue delirium precautions - BCx pending but NG @ 4 days - Palliative consulted, appreciate recommendations

## 2023-11-27 NOTE — Progress Notes (Signed)
 PT Cancellation Note  Patient Details Name: James Downs MRN: [id number] DOB: [birth year]   Cancelled Treatment:    Reason Eval/Treat Not Completed: (P) Patient declined, no reason specified (Pt reports fatigue and declines mobility at this time. Will follow up as able.)   Darryle George 11/27/2023, 12:57 PM

## 2023-11-27 NOTE — Assessment & Plan Note (Signed)
 This is the leading diagnosis for bilateral lower extremity lesions, given presentation with BUN of 33, apparent chronic presentation, signs of excoriations.  Could also be related to uremic pruritus, or other infectious cause but less likely given sparing soles of feet and no apparent infectious prodrome. - Continue to monitor at this time and treating decompensated cirrhosis and hepatorenal syndrome

## 2023-11-27 NOTE — Progress Notes (Signed)
 Reason for Consult:HRS Referring Physician: Weakness and altered mental status  James Downs is an 58 y.o. male.   HPI:   James Downs is a 58 year old male with history of non-alcoholic cirrhosis, HTN, HLD, and nephrolithiasis, presenting with weakness, abdominal pain, diarrhea, and several days of altered mental status.  Nephrology was consulted due to concern for hepatorenal syndrome.  Subjective:  No significant interval events.  Patient wife at bedside, states patient had a rough night upon hearing about the  poor prognosis from his liver disease.  SCr improved to about 2.0 from 2.2 yesterday, UOP 350cc  Objective:  BP 118/68 (BP Location: Right Arm)   Pulse 75   Temp 98.1 F (36.7 C) (Oral)   Resp 18   Ht 5' 7 (1.702 m)   Wt 85 kg   SpO2 97%   BMI 29.34 kg/m  Vitals:   11/26/23 2337 11/27/23 0350 11/27/23 0627 11/27/23 0829  BP: 112/79 117/68  118/68  Pulse: 74 71  75  Resp: 14 18  18   Temp: 98.8 F (37.1 C) 98 F (36.7 C)  98.1 F (36.7 C)  TempSrc: Oral Oral  Oral  SpO2: 100% 97%  97%  Weight:   85 kg   Height:       Physical exam Chronically ill-appearing, lying in bed. Jaundiced skin RRR, no murmurs. Abdomen distended, tender. Awake, follows commands.  No baseline Extremities without edema, noted hyperkeratotic rash on lower extremities  Labs: Basic Metabolic Panel: Recent Labs  Lab 11/23/23 0903 11/23/23 0922 11/24/23 0546 11/25/23 0424 11/26/23 0426 11/27/23 0205  NA 135 137 138 133* 131* 132*  K 4.2 4.2 3.9 3.5 3.7 3.8  CL 102 106 106 106 104 103  CO2 18*  --  18* 19* 19* 20*  GLUCOSE 98 92 89 132* 122* 141*  BUN 30* 33* 29* 30* 33* 34*  CREATININE 2.23* 2.30* 2.09* 2.12* 2.21* 1.99*  ALBUMIN  2.5*  --  2.9* 2.6* 2.5* 2.7*  CALCIUM 8.6*  --  8.6* 8.1* 8.1* 8.3*   Liver Function Tests: Recent Labs  Lab 11/25/23 0424 11/26/23 0426 11/27/23 0205  AST 28 25 24   ALT 14 13 13   ALKPHOS 37* 35* 30*  BILITOT 3.5* 3.9*  3.9*  PROT 5.3* 5.0* 5.0*  ALBUMIN  2.6* 2.5* 2.7*   Recent Labs  Lab 11/23/23 0903  LIPASE 41   Recent Labs  Lab 11/23/23 1529  AMMONIA 76*   CBC: Recent Labs  Lab 11/23/23 0903 11/23/23 0922 11/24/23 1954 11/25/23 0424 11/26/23 0426 11/27/23 0205  WBC 13.0*   < > 9.9 10.3 13.7* 10.5  NEUTROABS 10.3*  --   --   --   --   --   HGB 12.2*   < > 9.6* 10.9* 11.8* 11.0*  HCT 37.0*   < > 28.8* 32.4* 34.6* 33.0*  MCV 96.6   < > 96.0 95.3 94.8 95.7  PLT 198   < > 129* 130* 154 114*   < > = values in this interval not displayed.   PT/INR: @LABRCNTIP (inr:5) Cardiac Enzymes: )No results for input(s): CKTOTAL, CKMB, CKMBINDEX, TROPONINI in the last 168 hours. CBG: No results for input(s): GLUCAP in the last 168 hours.  Iron Studies: No results for input(s): IRON, TIBC, TRANSFERRIN, FERRITIN in the last 168 hours.  Xrays/Other Studies: IR Paracentesis Result Date: 11/25/2023 INDICATION: 58 year old male with history of decompensated liver cirrhosis, ascites. Request for therapeutic paracentesis. EXAM: ULTRASOUND GUIDED THERAPEUTIC PARACENTESIS MEDICATIONS: 10mL 1% Lidocaine   with epi. COMPLICATIONS: None immediate. PROCEDURE: Informed written consent was obtained from the patient's daughter in law after a discussion of the risks, benefits and alternatives to treatment. A timeout was performed prior to the initiation of the procedure. Initial ultrasound scanning demonstrates a large amount of ascites within the left lower abdominal quadrant. The left lower abdomen was prepped and draped in the usual sterile fashion. 1% lidocaine  with epi was used for local anesthesia. Following this, a 5 Fr centesis catheter was introduced. An ultrasound image was saved for documentation purposes. The paracentesis was performed. The catheter was removed and a dressing was applied. The patient tolerated the procedure well without immediate post procedural complication. FINDINGS: A total of  approximately 8.3 L of clear yellow fluid was removed. IMPRESSION: Successful ultrasound-guided paracentesis yielding 8.3 liters of peritoneal fluid. Performed by: Kristi Davenport, NP Electronically Signed   By: Wilkie Lent M.D.   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 -Strict I's and O's, daily weight, BMP. - Midodrine 30 mg TID, octreotide  200 mg TID  - Continue treatment as above.   Non Gap Metabolic acidosis: Mild: Improved Likely combination of AKI and diarrhea. Monitor for now  Missy Sandhoff 11/27/2023, 10:18 AM

## 2023-11-27 NOTE — Assessment & Plan Note (Signed)
 Ongoing for at least 1 week prior to admission with 4-8 black stools per day. Previous colonoscopy with tubular adenomas and hemorrhoids. Esophageal varices seen on endoscopy in March 2025. FOBT positive. - Continue Octreotide 200 mcg SQ TID - Continue pantoprazole 40 mg PO BID - General surgery consulted and s/o c/o bowel obstruction VS underline volume overloaded on CTAP  - AM CBC

## 2023-11-27 NOTE — Progress Notes (Signed)
 Daily Progress Note Intern Pager: [phone]  Patient name: James Downs Medical record number: [id number] Date of birth: 1965-10-11 Age: 58 y.o. Gender: male  Primary Care Provider: Patient, No Pcp Per Consultants: GI, nephrology, general surgery, IR Code Status: DNR/DNI  Pt Overview and Major Events to Date:  9/29: Admitted  Assessment and Plan: James Downs is a 58 y.o.male who presented with AMS found to be 2/2 decompensated liver cirrhosis with hepatorenal syndrome, esophageal varices, and hepatic encephalopathy. Pertinent PMH/PSH includes NASH, esophageal varices, gastric ulcer, portal HTN, HLD, HTN, GAD, tobacco use, OSA, nephrolithiasis. Mentation continues to improve. Cr improving.  Assessment & Plan Decompensated cirrhosis (HCC) Hepatorenal syndrome (HCC) Esophageal varices in cirrhosis (HCC) Altered mental state Hemodynamically stable with MAP 82 this AM, room to increase midodrine to meet goals. Cr 2.21 > 1.99, GFR 33 > 38, AKI likely from hepatorenal syndrome, improving. Mental status continues to improve. Increased abdominal fullness and patient will likely require a 3rd paracentesis over the weekend but no discomfort at time of exam. Only 1 documented BM in 24hrs despite increasing lactulose  but may 2/2 decreased PO intake. Also possible fecal managmeent system is only assessed once daily, will speak with nursing to increase checks. WBC normal today at 10.5. Given overall long-term poor prognosis in setting of liver failure, consult for palliative care placed yesterday after discussion with family. - GI consulted, appreciate recommendations  - Continue Lactulose  to 30mg  TID - Timely checks to follow BM - Continue Rifaximin 550 mg PO BID - S/p paracentesis with 7.9 L removed 9/29 and 8.3 L 10/1 - Repeat therapeutic paracentesis as needed given cannot start diuretics at this time 2/2 AKI. - Nephrology consulted, appreciate recs - Increase Midodrine to 30mg   TIDAC - Can titrate up q8hrs up to 50mg  TID to meet MAP goal of 105-110 - Continue Albumin  37.5 g daily - Continue Octreotide 200 mcg SQ TID - Strict I/Os, daily weights - Continue to hold Lasix , Coreg , and spironolactone  for now given softer BP - Discontinued Rocephin  2 g IV Q24H 10/2 for SBP prophylaxis - AM CMP, CBC, PT/INR - PT/OT following - Continue delirium precautions - BCx pending but NG @ 4 days - Palliative consulted, appreciate recommendations Black stool Ongoing for at least 1 week prior to admission with 4-8 black stools per day. Previous colonoscopy with tubular adenomas and hemorrhoids. Esophageal varices seen on endoscopy in March 2025. FOBT positive. 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  Could also be related to uremic pruritus, or other infectious cause but less likely given sparing soles of feet and no apparent infectious prodrome. - Continue to monitor at this time and treating decompensated cirrhosis and hepatorenal syndrome Depression Concern for depression being a component of current patient presentation. Patient reports depressive state since losing job. Other stressors include family issues. After discussing with pharmacy, escitalopram is appropriate to begin at this time at a low dose and titrate up slowly as needed. - Continue Escitalopram 5mg  daily  FEN/GI: Regular PPx: SCD Dispo:Pending palliative care consult  Subjective:  Patient easily awoke to name this AM. More alert and conversational. States he feels somewhat better today but still overall not well. Per  wife, patient is motivated to work on his health. Patient with  questions about if liver transplant is possible and what that would look like. Discussed with patient and wife that liver transplant may not be likely given patient's current status.  Objective: Temp:  [97.9 F (36.6 C)-98.8 F (37.1 C)] 98.3 F (36.8 C) (10/03 1104) Pulse Rate:  [61-75] 64 (10/03 1104) Resp:  [14-20] 20 (10/03 1104) BP: (106-118)/(58-79) 118/75 (10/03 1104) SpO2:  [97 %-100 %] 97 % (10/03 1104) Weight:  [85 kg] 85 kg (10/03 9372)  Physical Exam: General: Alert, awake, male in NAD.  HEENT: No sign of trauma, EOM grossly intact. Cardiovascular: RRR, no m/r/g appreciated. Pulmonary: Normal WOB. CTAB with no w/c/r present. Abdomen: Normoactive bowel sounds. Increased abdominal fullness with TTP, soft. Extremities: Warm, 2+ edema in bilateral LE Neurologic: No focal deficits Skin: Warm and dry. Lesions present on bilateral L  Laboratory: Most recent CBC Lab Results  Component Value Date   WBC 10.5 11/27/2023   HGB 11.0 (L) 11/27/2023   HCT 33.0 (L) 11/27/2023   MCV 95.7 11/27/2023   PLT 114 (L) 11/27/2023   Most recent BMP    Latest Ref Rng & Units 11/27/2023    2:05 AM  BMP  Glucose 70 - 99 mg/dL 858   BUN 6 - 20 mg/dL 34   Creatinine 9.38 - 1.24 mg/dL 8.00   Sodium 864 - 854 mmol/L 132   Potassium 3.5 - 5.1 mmol/L 3.8   Chloride 98 - 111 mmol/L 103   CO2 22 - 32 mmol/L 20   Calcium 8.9 - 10.3 mg/dL 8.3     Other pertinent labs: Prothrombin time 25.7, INR 2.2    [name], DO 11/27/2023, 11:21 AM  PGY-1, [facility] FPTS Intern pager: [phone], text pages welcome Secure chat group [facility] Teaching Service

## 2023-11-28 DIAGNOSIS — K767 Hepatorenal syndrome: Secondary | ICD-10-CM

## 2023-11-28 DIAGNOSIS — K7290 Hepatic failure, unspecified without coma: Secondary | ICD-10-CM | POA: Diagnosis not present

## 2023-11-28 DIAGNOSIS — N179 Acute kidney failure, unspecified: Secondary | ICD-10-CM | POA: Insufficient documentation

## 2023-11-28 DIAGNOSIS — Z515 Encounter for palliative care: Secondary | ICD-10-CM | POA: Diagnosis not present

## 2023-11-28 DIAGNOSIS — K7682 Hepatic encephalopathy: Secondary | ICD-10-CM | POA: Diagnosis not present

## 2023-11-28 LAB — COMPREHENSIVE METABOLIC PANEL WITH GFR
ALT: 13 U/L (ref 0–44)
AST: 29 U/L (ref 15–41)
Albumin: 2.9 g/dL — ABNORMAL LOW (ref 3.5–5.0)
Alkaline Phosphatase: 32 U/L — ABNORMAL LOW (ref 38–126)
Anion gap: 9 (ref 5–15)
BUN: 29 mg/dL — ABNORMAL HIGH (ref 6–20)
CO2: 21 mmol/L — ABNORMAL LOW (ref 22–32)
Calcium: 8.2 mg/dL — ABNORMAL LOW (ref 8.9–10.3)
Chloride: 105 mmol/L (ref 98–111)
Creatinine, Ser: 1.51 mg/dL — ABNORMAL HIGH (ref 0.61–1.24)
GFR, Estimated: 53 mL/min — ABNORMAL LOW (ref >60)
Glucose, Bld: 118 mg/dL — ABNORMAL HIGH (ref 70–99)
Potassium: 3.5 mmol/L (ref 3.5–5.1)
Sodium: 135 mmol/L (ref 135–145)
Total Bilirubin: 3.3 mg/dL — ABNORMAL HIGH (ref 0.0–1.2)
Total Protein: 5.3 g/dL — ABNORMAL LOW (ref 6.5–8.1)

## 2023-11-28 LAB — PROTIME-INR
INR: 2.1 — ABNORMAL HIGH (ref 0.8–1.2)
Prothrombin Time: 24.4 s — ABNORMAL HIGH (ref 11.4–15.2)

## 2023-11-28 LAB — CBC
HCT: 33.8 % — ABNORMAL LOW (ref 39.0–52.0)
Hemoglobin: 11.6 g/dL — ABNORMAL LOW (ref 13.0–17.0)
MCH: 32.8 pg (ref 26.0–34.0)
MCHC: 34.3 g/dL (ref 30.0–36.0)
MCV: 95.5 fL (ref 80.0–100.0)
Platelets: 121 K/uL — ABNORMAL LOW (ref 150–400)
RBC: 3.54 MIL/uL — ABNORMAL LOW (ref 4.22–5.81)
RDW: 15.7 % — ABNORMAL HIGH (ref 11.5–15.5)
WBC: 10.7 K/uL — ABNORMAL HIGH (ref 4.0–10.5)
nRBC: 0 % (ref 0.0–0.2)

## 2023-11-28 LAB — CULTURE, BLOOD (ROUTINE X 2)
Culture: NO GROWTH
Culture: NO GROWTH
Special Requests: ADEQUATE

## 2023-11-28 MED ORDER — POTASSIUM CHLORIDE CRYS ER 20 MEQ PO TBCR
40.0000 meq | EXTENDED_RELEASE_TABLET | Freq: Once | ORAL | Status: AC
  Administered 2023-11-28: 40 meq via ORAL
  Filled 2023-11-28: qty 2

## 2023-11-28 MED ORDER — MIDODRINE HCL 5 MG PO TABS: 40.0000 mg | ORAL_TABLET | Freq: Three times a day (TID) | ORAL | Status: DC

## 2023-11-28 MED ORDER — MIDODRINE HCL 5 MG PO TABS
30.0000 mg | ORAL_TABLET | Freq: Three times a day (TID) | ORAL | Status: DC
  Administered 2023-11-28 – 2023-12-11 (×37): 30 mg via ORAL
  Filled 2023-11-28 (×38): qty 6

## 2023-11-28 MED ORDER — ENOXAPARIN SODIUM 40 MG/0.4ML IJ SOSY
40.0000 mg | PREFILLED_SYRINGE | INTRAMUSCULAR | Status: AC
Start: 2023-11-28 — End: ?
  Administered 2023-11-28 – 2023-12-11 (×14): 40 mg via SUBCUTANEOUS
  Filled 2023-11-28 (×13): qty 0.4

## 2023-11-28 NOTE — Progress Notes (Signed)
 Daily Progress Note Intern Pager: [phone]  Patient name: James Downs Medical record number: [id number] Date of birth: 1965-12-13 Age: 58 y.o. Gender: male  Primary Care Provider: Patient, No Pcp Per Consultants: GI, nephrology, general surgery, IR  Code Status: DNR/DNI  Pt Overview and Major Events to Date:  9/29: Admitted   Assessment and Plan:  James Downs is a 58 y.o.male who presented with AMS found to be 2/2 decompensated liver cirrhosis with hepatorenal syndrome, esophageal varices, and hepatic encephalopathy. Pertinent PMH/PSH includes NASH, esophageal varices, gastric ulcer, portal HTN, HLD, HTN, GAD, tobacco use, OSA, nephrolithiasis.  At this time he clinically appears stable; however, there are ongoing conversations about the severity of his current condition and minimal options for further treatment interventions. Assessment & Plan Decompensated cirrhosis (HCC) Hepatorenal syndrome (HCC) Esophageal varices in cirrhosis (HCC) Altered mental state Fully oriented today. Unclear how often he is having a BM, but he thinks it is several per day. Clinically, has a fluid wave on exam but is comfortable so does not warrant repeat paracentesis at this time. Ongoing GOC discussion between patient/family and primary team, GI, and palliative. - GI consulted, appreciate recommendations  - Continue Lactulose  to 30mg  TID - Continue Rifaximin 550 mg PO BID - Repeat therapeutic paracentesis as needed given cannot start diuretics at this time 2/2 AKI. - AM CMP, CBC, PT/INR - can consider therapeutic paracentesis tomorrow but not need today - PT/OT following - Continue delirium precautions - BCx pending but NG @ 4 days - restart lovenox for DVT prophylaxis given stability of hgb and platelets, will reeval CBC in AM AKI (acute kidney injury) Cr continues to improve 2.21>1.99>1.51. Concern due to HRS, which is being treated. MAP remains in 80s, below goal >105.  -  nephrology following, appreciate recs - continue midodrine at 30 mg TID today given improving creatinine despite not at goal MAP - continue octreotide 200 mg TID - strict I&Os, daily weights - AM CMP Black stool Hgb stable. Does not raise this concern today.  - AM CBC Reactive perforating collagenosis No new concerns regarding lesions today.  - treating cirrhosis as above Depression Initiated SSRI during hospitalization but do not effect full medication effect until 4-6 weeks after initiation. Would recommend outpatient CBT concurrent with medication management. - Continue Escitalopram 5mg  daily  FEN/GI: sodium restriction PPx: restart lovenox Dispo:SNF pending clinical improvement .   Subjective:  Patient reports no changes in his status.  Overall he is comfortable, abdominal fluid is not distressing to him at this time  Objective: Temp:  [98 F (36.7 C)-98.9 F (37.2 C)] 98.3 F (36.8 C) (10/04 0721) Pulse Rate:  [61-75] 64 (10/04 0721) Resp:  [15-20] 15 (10/04 0721) BP: (114-123)/(68-75) 120/72 (10/04 0721) SpO2:  [95 %-97 %] 97 % (10/04 0721) Weight:  [83.2 kg] 83.2 kg (10/04 0500) Physical Exam: General: Lying awake in bed Cardiovascular: Regular rate and rhythm no murmurs appreciated Respiratory: Lungs clear to auscultation bilaterally, breathing comfortably on room air Abdomen: Moderately distended, soft, nontender, fluid wave is present Psych: Alert and oriented to person place and time  Laboratory: Most recent CBC Lab Results  Component Value Date   WBC 10.7 (H) 11/28/2023   HGB 11.6 (L) 11/28/2023   HCT 33.8 (L) 11/28/2023   MCV 95.5 11/28/2023   PLT 121 (L) 11/28/2023   Most recent BMP    Latest Ref Rng & Units 11/28/2023    6:04 AM  BMP  Glucose 70 - 99  mg/dL 881   BUN 6 - 20 mg/dL 29   Creatinine 9.38 - 1.24 mg/dL 8.48   Sodium 864 - 854 mmol/L 135   Potassium 3.5 - 5.1 mmol/L 3.5   Chloride 98 - 111 mmol/L 105   CO2 22 - 32 mmol/L 21   Calcium  8.9 - 10.3 mg/dL 8.2    AST/ALT 70/86 T bili 3.3 T protein 5.3  Imaging/Diagnostic Tests: None  Alena Morrison, Elio, MD 11/28/2023, 7:43 AM  PGY-1, [facility] FPTS Intern pager: [phone], text pages welcome Secure chat group [facility] Teaching Service

## 2023-11-28 NOTE — Progress Notes (Signed)
 Palliative Medicine Inpatient Follow Up Note   HPI: 58 y.o. male  with past medical history of non-alcoholic cirrhosis, esophageal varices, gastric ulcer, portal HTN, HLD, nephrolithiasis  admitted on 11/23/2023 with 2-3 days of weakness, abdominal pain, and diarrhea.    11/23/2023: Paracentesis done, 7.9 L  of ascitic fluid removed 11/25/2023: Paracentesis done, 8.3 L of ascitic fluid removed    PMT has been consulted to assist with goals of care conversation. Patient/Family face treatment option decisions, advanced directive decisions and anticipatory care needs.    Family face treatment option decision, advance directive decisions and anticipatory care needs.    Today's Discussion 11/28/2023  *Please note that this is a verbal dictation therefore any spelling or grammatical errors are due to the Dragon Medical One system interpretation.  Chart reviewed inclusive of vital signs, progress notes, laboratory results, and diagnostic images.   Visited patient today, he is chronically-ill appearing, not in any form of distress. He is fully oriented and able to answer questions well. He reports being rested and comfortable although shares that he is still feeling sad about what is happening with his declining health. Wife is present at bedside.   I revisited the goals of care and discussed the progression of his illness, reassessing his preferences regarding medical interventions. Given the extent of his disease and the expected increase in symptom burden, I reintroduced the concept of comfort-focused care. Both the patient and his wife, who were present at the bedside, were receptive to the discussion and expressed openness to a comfort-focused approach should his condition continue to decline despite ongoing medical treatment. I also explained that due to the advanced nature of his disease, options for further medical intervention are limited. As noted in the GI consult, TIPS is not feasible  due to his elevated MELD score. The patient shared that he remains hopeful, which I acknowledged, while gently reinforcing the reality of his situation and the potential benefits of a comfort-focused plan for both him and his family.  Received report from bedside RN, no major events happened overnight.   Created space and opportunity for patient to explore thoughts feelings and fears regarding current medical situation.  Patient and his family face treatment option decisions, advanced directive decisions and anticipatory care needs.   Questions and concerns addressed.  Palliative Support Provided.   Objective Assessment: Vital Signs Vitals:   11/28/23 0721 11/28/23 1139  BP: 120/72 137/77  Pulse: 64 61  Resp: 15 15  Temp: 98.3 F (36.8 C) 98 F (36.7 C)  SpO2: 97% 95%    Intake/Output Summary (Last 24 hours) at 11/28/2023 1223 Last data filed at 11/28/2023 0600 Gross per 24 hour  Intake 448.93 ml  Output 590 ml  Net -141.07 ml   Last Weight  Most recent update: 11/28/2023  6:25 AM    Weight  83.2 kg (183 lb 6.4 oz)             Physical Exam: General: Alert, chronically-ill appearing HEENT: No sign of trauma, EOM grossly intact. Cardiovascular: RRR, HR 61 BPM Pulmonary: Normal WOB. CTAB with no w/c/r present. Abdomen: Moderately distended, fluid wave is present. Extremities: Warm, 2+ edema in bilateral LE. Neurologic: No focal deficits, alert and oriented to person place and time. Skin: Warm and dry.    SUMMARY OF RECOMMENDATIONS   Code Status: Maintain DNR/DNI status Goal of care currently is medical stabilization, allowing time for outcomes Discussed the importance of continued conversation with family and the medical providers regarding  overall plan of care and treatment options, ensuring decisions are within the context of the patient's values and GOCs.  Spiritual care consult to assist with advance directive creation (Consult placed 11/27/2023) Continue to  provide psycho-social and emotional support to patient and family Palliative medicine team will continue to follow.    Symptom Management: Per Primary team Palliative medicine is available to assist as needed.     Time Spent: 35 minutes  Detailed review of medical records (labs, imaging, vital signs), medically appropriate exam, discussed with treatment team, counseling and education to patient, family, & staff, documenting clinical information, coordination of care.   ______________________________________________________________________________________ Kathlyne Bolder NP-C [facility] Palliative [facility] Team Team Cell Phone: [phone] Please utilize secure chat with additional questions, if there is no response within 30 minutes please call the above phone number  Palliative Medicine Team providers are available by phone from 7am to 7pm daily and can be reached through the team cell phone.  Should this patient require assistance outside of these hours, please call the patient's attending physician.

## 2023-11-28 NOTE — Progress Notes (Signed)
 Reason for Consult:HRS Referring Physician: Weakness and altered mental status  James Downs is an 58 y.o. male.   HPI:   TYWONE BEMBENEK is a 58 year old male with history of non-alcoholic cirrhosis, HTN, HLD, and nephrolithiasis, presenting with weakness, abdominal pain, diarrhea, and several days of altered mental status.  Nephrology was consulted due to concern for hepatorenal syndrome.  Subjective:  No major events reported overnight.  Vital signs stable.  UOP 890.  Creatinine 1.51.  Overall doing well.  Wife at bedside.  Objective:  BP 120/72 (BP Location: Left Arm)   Pulse 64   Temp 98.3 F (36.8 C) (Oral)   Resp 15   Ht 5' 7 (1.702 m)   Wt 83.2 kg   SpO2 97%   BMI 28.72 kg/m  Vitals:   11/28/23 0109 11/28/23 0443 11/28/23 0500 11/28/23 0721  BP: 114/70 121/74  120/72  Pulse: 61 67  64  Resp: 18 16  15   Temp: 98.9 F (37.2 C) 98 F (36.7 C)  98.3 F (36.8 C)  TempSrc: Oral Oral  Oral  SpO2: 95% 95%  97%  Weight:   83.2 kg   Height:       Physical exam Chronically ill-appearing, lying in bed. Jaundiced skin RRR, no murmurs. Abdomen mild distended, nontender. Awake, follows commands.  At baseline Extremities without edema, noted hyperkeratotic rash on lower extremities  Labs: Basic Metabolic Panel: Recent Labs  Lab 11/23/23 0903 11/23/23 0922 11/24/23 0546 11/25/23 0424 11/26/23 0426 11/27/23 0205 11/28/23 0604  NA 135 137 138 133* 131* 132* 135  K 4.2 4.2 3.9 3.5 3.7 3.8 3.5  CL 102 106 106 106 104 103 105  CO2 18*  --  18* 19* 19* 20* 21*  GLUCOSE 98 92 89 132* 122* 141* 118*  BUN 30* 33* 29* 30* 33* 34* 29*  CREATININE 2.23* 2.30* 2.09* 2.12* 2.21* 1.99* 1.51*  ALBUMIN  2.5*  --  2.9* 2.6* 2.5* 2.7* 2.9*  CALCIUM 8.6*  --  8.6* 8.1* 8.1* 8.3* 8.2*   Liver Function Tests: Recent Labs  Lab 11/26/23 0426 11/27/23 0205 11/28/23 0604  AST 25 24 29   ALT 13 13 13   ALKPHOS 35* 30* 32*  BILITOT 3.9* 3.9* 3.3*  PROT 5.0* 5.0* 5.3*   ALBUMIN  2.5* 2.7* 2.9*   Recent Labs  Lab 11/23/23 0903  LIPASE 41   Recent Labs  Lab 11/23/23 1529  AMMONIA 76*   CBC: Recent Labs  Lab 11/23/23 0903 11/23/23 0922 11/25/23 0424 11/26/23 0426 11/27/23 0205 11/28/23 0604  WBC 13.0*   < > 10.3 13.7* 10.5 10.7*  NEUTROABS 10.3*  --   --   --   --   --   HGB 12.2*   < > 10.9* 11.8* 11.0* 11.6*  HCT 37.0*   < > 32.4* 34.6* 33.0* 33.8*  MCV 96.6   < > 95.3 94.8 95.7 95.5  PLT 198   < > 130* 154 114* 121*   < > = values in this interval not displayed.   PT/INR: @LABRCNTIP (inr:5) Cardiac Enzymes: )No results for input(s): CKTOTAL, CKMB, CKMBINDEX, TROPONINI in the last 168 hours. CBG: No results for input(s): GLUCAP in the last 168 hours.  Iron Studies: No results for input(s): IRON, TIBC, TRANSFERRIN, FERRITIN in the last 168 hours.  Xrays/Other Studies: No results found.   Assessment/Plan: Acute Kidney Injury Concern for HRS  Treating HRS with midodrine, octreotide and albumin , renal labs improved today. -Strict I's and O's, daily weight, BMP. -  Continue midodrine 40 mg TID, octreotide  200 mg TID, Albumin  40g qd - Continue treatment as above.  Non Gap Metabolic acidosis: Mild: Improved Likely combination of AKI and diarrhea. Monitor for now  Decompensated cirrhosis: GI following.  Receiving repeat paracentesis.  On lactulose  and rifampin.  Per GI, TIPS not option due to high MELD.  Terilyn Sano M Jazma Pickel 11/28/2023, 7:57 AM

## 2023-11-28 NOTE — Assessment & Plan Note (Addendum)
 Cr continues to improve 2.21>1.99>1.51. Concern due to HRS, which is being treated. MAP remains in 80s, below goal >105.  - nephrology following, appreciate recs - continue midodrine at 30 mg TID today given improving creatinine despite not at goal MAP - continue octreotide 200 mg TID - strict I&Os, daily weights - AM CMP

## 2023-11-28 NOTE — Assessment & Plan Note (Addendum)
 Fully oriented today. Unclear how often he is having a BM, but he thinks it is several per day. Clinically, has a fluid wave on exam but is comfortable so does not warrant repeat paracentesis at this time. Ongoing GOC discussion between patient/family and primary team, GI, and palliative. - GI consulted, appreciate recommendations  - Continue Lactulose  to 30mg  TID - Continue Rifaximin 550 mg PO BID - Repeat therapeutic paracentesis as needed given cannot start diuretics at this time 2/2 AKI. - AM CMP, CBC, PT/INR - can consider therapeutic paracentesis tomorrow but not need today - PT/OT following - Continue delirium precautions - BCx pending but NG @ 4 days - restart lovenox for DVT prophylaxis given stability of hgb and platelets, will reeval CBC in AM

## 2023-11-28 NOTE — Assessment & Plan Note (Addendum)
 No new concerns regarding lesions today.  - treating cirrhosis as above

## 2023-11-28 NOTE — Assessment & Plan Note (Addendum)
 Hgb stable. Does not raise this concern today.  - AM CBC

## 2023-11-28 NOTE — Progress Notes (Signed)
 Subjective: No acute events.  Objective: Vital signs in last 24 hours: Temp:  [98 F (36.7 C)-98.9 F (37.2 C)] 98.3 F (36.8 C) (10/04 0721) Pulse Rate:  [61-75] 64 (10/04 0721) Resp:  [15-20] 15 (10/04 0721) BP: (114-123)/(68-75) 120/72 (10/04 0721) SpO2:  [95 %-97 %] 97 % (10/04 0721) Weight:  [83.2 kg] 83.2 kg (10/04 0500) Last BM Date : 11/27/23 ([place])  Intake/Output from previous day: 10/03 0701 - 10/04 0700 In: 448.9 [P.O.:360; IV Piggyback:88.9] Out: 890 [Urine:890] Intake/Output this shift: No intake/output data recorded.  General appearance: slowed mentation GI: soft, distended with ascites  Lab Results: Recent Labs    11/26/23 0426 11/27/23 0205 11/28/23 0604  WBC 13.7* 10.5 10.7*  HGB 11.8* 11.0* 11.6*  HCT 34.6* 33.0* 33.8*  PLT 154 114* 121*   BMET Recent Labs    11/26/23 0426 11/27/23 0205  NA 131* 132*  K 3.7 3.8  CL 104 103  CO2 19* 20*  GLUCOSE 122* 141*  BUN 33* 34*  CREATININE 2.21* 1.99*  CALCIUM 8.1* 8.3*   LFT Recent Labs    11/27/23 0205  PROT 5.0*  ALBUMIN  2.7*  AST 24  ALT 13  ALKPHOS 30*  BILITOT 3.9*   PT/INR Recent Labs    11/26/23 0426 11/27/23 0205  LABPROT 24.8* 25.7*  INR 2.1* 2.2*   Hepatitis Panel No results for input(s): HEPBSAG, HCVAB, HEPAIGM, HEPBIGM in the last 72 hours. C-Diff No results for input(s): CDIFFTOX in the last 72 hours. Fecal Lactopherrin No results for input(s): FECLLACTOFRN in the last 72 hours.  Studies/Results: No results found.  Medications: Scheduled:  escitalopram  5 mg Oral Daily   feeding supplement  237 mL Oral BID BM   lactulose   30 g Oral TID   midodrine  30 mg Oral TID WC   octreotide  200 mcg Subcutaneous TID   pantoprazole  40 mg Oral BID AC   rifaximin  550 mg Oral BID   Continuous:  albumin  human 37.5 g (11/27/23 0939)    Assessment/Plan: 1) Ascites. 2) Decompensated cirrhosis.  MELD 3.0 - 30. 3) HRS versus AKI - labs pending. 4)  Hepatic encephalopathy.   No acute events overnight.  The CMET is pending for this AM.  No note from Palliative Care in Epic.  Patient states that the plan is to press on with care.  TIPS is not an option for him as his MELD is too high.  Treatment for HRS and repeat paracentesis will need to be performed until a creatinine is low enough to try diuretics.  Plan: 1) Continue with medical management. 2) Maintain 2 gram sodium diet.   LOS: 5 days   Jayliah Benett D 11/28/2023, 7:23 AM

## 2023-11-28 NOTE — Assessment & Plan Note (Signed)
 Initiated SSRI during hospitalization but do not effect full medication effect until 4-6 weeks after initiation. Would recommend outpatient CBT concurrent with medication management. - Continue Escitalopram 5mg  daily

## 2023-11-29 DIAGNOSIS — Z7189 Other specified counseling: Secondary | ICD-10-CM | POA: Diagnosis not present

## 2023-11-29 DIAGNOSIS — K7460 Unspecified cirrhosis of liver: Secondary | ICD-10-CM | POA: Diagnosis not present

## 2023-11-29 DIAGNOSIS — R531 Weakness: Secondary | ICD-10-CM

## 2023-11-29 DIAGNOSIS — Z515 Encounter for palliative care: Secondary | ICD-10-CM | POA: Diagnosis not present

## 2023-11-29 DIAGNOSIS — K7290 Hepatic failure, unspecified without coma: Secondary | ICD-10-CM | POA: Diagnosis not present

## 2023-11-29 LAB — COMPREHENSIVE METABOLIC PANEL WITH GFR
ALT: 16 U/L (ref 0–44)
AST: 31 U/L (ref 15–41)
Albumin: 3.2 g/dL — ABNORMAL LOW (ref 3.5–5.0)
Alkaline Phosphatase: 32 U/L — ABNORMAL LOW (ref 38–126)
Anion gap: 10 (ref 5–15)
BUN: 27 mg/dL — ABNORMAL HIGH (ref 6–20)
CO2: 21 mmol/L — ABNORMAL LOW (ref 22–32)
Calcium: 8.8 mg/dL — ABNORMAL LOW (ref 8.9–10.3)
Chloride: 106 mmol/L (ref 98–111)
Creatinine, Ser: 1.61 mg/dL — ABNORMAL HIGH (ref 0.61–1.24)
GFR, Estimated: 49 mL/min — ABNORMAL LOW (ref >60)
Glucose, Bld: 135 mg/dL — ABNORMAL HIGH (ref 70–99)
Potassium: 3.7 mmol/L (ref 3.5–5.1)
Sodium: 137 mmol/L (ref 135–145)
Total Bilirubin: 3.8 mg/dL — ABNORMAL HIGH (ref 0.0–1.2)
Total Protein: 5.6 g/dL — ABNORMAL LOW (ref 6.5–8.1)

## 2023-11-29 LAB — CBC
HCT: 34.5 % — ABNORMAL LOW (ref 39.0–52.0)
Hemoglobin: 11.7 g/dL — ABNORMAL LOW (ref 13.0–17.0)
MCH: 32.3 pg (ref 26.0–34.0)
MCHC: 33.9 g/dL (ref 30.0–36.0)
MCV: 95.3 fL (ref 80.0–100.0)
Platelets: 106 K/uL — ABNORMAL LOW (ref 150–400)
RBC: 3.62 MIL/uL — ABNORMAL LOW (ref 4.22–5.81)
RDW: 15.9 % — ABNORMAL HIGH (ref 11.5–15.5)
WBC: 9.2 K/uL (ref 4.0–10.5)
nRBC: 0 % (ref 0.0–0.2)

## 2023-11-29 LAB — PROTIME-INR
INR: 2.1 — ABNORMAL HIGH (ref 0.8–1.2)
Prothrombin Time: 24.7 s — ABNORMAL HIGH (ref 11.4–15.2)

## 2023-11-29 MED ORDER — SPIRONOLACTONE 25 MG PO TABS
100.0000 mg | ORAL_TABLET | Freq: Every day | ORAL | Status: DC
Start: 2023-11-29 — End: 2023-12-01
  Administered 2023-11-29 – 2023-12-01 (×3): 100 mg via ORAL
  Filled 2023-11-29 (×3): qty 4

## 2023-11-29 NOTE — Assessment & Plan Note (Signed)
 Bump in creatinine from 1.51-1.61, will avoid any further changes to medications given initiation of spironolactone  treatment today by GI.  Continue to monitor. - nephrology following, appreciate recs - continue midodrine at 30 mg TID today given improving creatinine despite not at goal MAP - continue octreotide 200 mg TID - strict I&Os, daily weights - AM CMP

## 2023-11-29 NOTE — Assessment & Plan Note (Signed)
 Patient remains fully oriented today and conversant.  Per GI who was present in the room on my exam this morning, they will be referring him to transplant though have made no promises as to the outcomes.  Will continue with symptomatic management with goal of discharge in the next couple days - GI consulted, appreciate recommendations  - Continue Lactulose  to 30mg  TID - Continue Rifaximin 550 mg PO BID - Given spironolactone  100 mg daily -Could still consider therapeutic paracentesis if needed, but recommend limiting due to increase in risk for SBP - AM CMP, CBC, PT/INR - can consider therapeutic paracentesis tomorrow but not need today - PT/OT following - Continue delirium precautions - BCx pending but NG @ 4 days - CBC stable, continue Lovenox

## 2023-11-29 NOTE — Progress Notes (Signed)
 Palliative Medicine Inpatient Follow Up Note   HPI:  58 y.o. male  with past medical history of non-alcoholic cirrhosis, esophageal varices, gastric ulcer, portal HTN, HLD, nephrolithiasis  admitted on 11/23/2023 with 2-3 days of weakness, abdominal pain, and diarrhea.    11/23/2023: Paracentesis done, 7.9 L  of ascitic fluid removed 11/25/2023: Paracentesis done, 8.3 L of ascitic fluid removed    PMT has been consulted to assist with goals of care conversation. Patient/Family face treatment option decisions, advanced directive decisions and anticipatory care needs.    Family face treatment option decision, advance directive decisions and anticipatory care needs.   Today's Discussion 11/29/2023  *Please note that this is a verbal dictation therefore any spelling or grammatical errors are due to the Dragon Medical One system interpretation.  Chart reviewed inclusive of vital signs, progress notes, laboratory results, and diagnostic images.   I visited the patient at bedside today. He appeared chronically ill but was not in any acute distress. He was able to engage in conversation and clearly express his needs. His wife, [name], was present during the visit. The patient reported feeling well and stated that he had a restful night.  I provided an update on his treatment plan, including the improvement in renal function. Based on GI recommendations, Aldactone  may now be initiated. According to the GI consult note, a referral to transplant is being considered, though outcomes remain uncertain and no guarantees have been made.  Goals of care were revisited during the encounter. As of today, the patient's goals remain unchanged. He continues to express hope for clinical improvement.  While in the room, I contacted [name] (daughter-in-law) to provide a comprehensive update on the patient's current condition, treatment plan, and goals of care. The medical team will continue appropriate  interventions aimed at supporting potential clinical improvement. I also reintroduced the idea of outpatient palliative or hospice services should the patient's condition decline despite ongoing treatment, given the advanced and progressive nature of his liver cirrhosis. Patient is considering this pathway but as of now remains undecided. I emphasized the importance of continued family support and open communication among the patient, family, and care team as the clinical situation evolves. Courtney expressed appreciation for the update and the discussion.  Received bedside RN report. No acute events occurred overnight.  Created space and opportunity for patient to explore thoughts feelings and fears regarding current medical situation.  Patient and her family face treatment option decisions, advanced directive decisions and anticipatory care needs.   Questions and concerns addressed   Palliative Support Provided.   Objective Assessment: Vital Signs Vitals:   11/29/23 0404 11/29/23 0857  BP: 116/74 117/71  Pulse: 69 67  Resp: 16 16  Temp: 98 F (36.7 C) 97.8 F (36.6 C)  SpO2: 98% 98%    Intake/Output Summary (Last 24 hours) at 11/29/2023 0917 Last data filed at 11/29/2023 0600 Gross per 24 hour  Intake 150 ml  Output 2125 ml  Net -1975 ml   Last Weight  Most recent update: 11/29/2023  4:58 AM    Weight  81.6 kg (180 lb)             Physical Exam: General: Alert, chronically-ill appearing, pale looking HEENT: No sign of trauma, EOM grossly intact. Cardiovascular: RRR, HR 61 BPM Pulmonary: Normal WOB. CTAB with no w/c/r present. Abdomen: Moderately distended, fluid wave is present. Extremities: Warm, 2+ edema in bilateral LE. Neurologic: No focal deficits, alert and oriented to person place and time. Skin:  Warm and dry.  SUMMARY OF RECOMMENDATIONS    Code Status: Maintain DNR/DNI status Goal of care currently is medical stabilization, allowing time for  outcomes Reintroduced comfort-focused care as an option given the patient's decompensated liver cirrhosis. Patient remains undecided at this time. Discussed the importance of continued conversation with family and the medical providers regarding overall plan of care and treatment options, ensuring decisions are within the context of the patient's values and GOCs.  Spiritual care consult to assist with advance directive creation (Consult placed 11/27/2023). Continue to provide psycho-social and emotional support to patient and family. Palliative medicine team will continue to follow.    Symptom Management: Per Primary team Palliative medicine is available to assist as needed.    Time Spent: 50 minutes  Detailed review of medical records (labs, imaging, vital signs), medically appropriate exam, discussed with treatment team, counseling and education to patient, family, & staff, documenting clinical information, coordination of care.  ____________________________________________________________________ Kathlyne Bolder NP-C [facility] Palliative Medicine Team Team Cell Phone: [phone] Please utilize secure chat with additional questions, if there is no response within 30 minutes please call the above phone number  Palliative Medicine Team providers are available by phone from 7am to 7pm daily and can be reached through the team cell phone.  Should this patient require assistance outside of these hours, please call the patient's attending physician.

## 2023-11-29 NOTE — Progress Notes (Signed)
 Reason for Consult:HRS Referring Physician: Weakness and altered mental status  James Downs is an 58 y.o. male.   HPI:   James Downs is a 58 year old male with history of non-alcoholic cirrhosis, HTN, HLD, and nephrolithiasis, presenting with weakness, abdominal pain, diarrhea, and several days of altered mental status.  Nephrology was consulted due to concern for hepatorenal syndrome.  Subjective:  No major events reported overnight.  Vital signs stable.  On room air.  UOP 1.1 L.  Creatinine stable.  Seen in bed.  Wife at bedside.  Overall no major change.  Objective:  BP 116/74 (BP Location: Left Arm)   Pulse 69   Temp 98 F (36.7 C) (Oral)   Resp 16   Ht 5' 7 (1.702 m)   Wt 81.6 kg   SpO2 98%   BMI 28.19 kg/m  Vitals:   11/28/23 1955 11/28/23 2352 11/29/23 0404 11/29/23 0457  BP: 112/74 108/71 116/74   Pulse: 63 (!) 57 69   Resp: 17 17 16    Temp: (!) 97.5 F (36.4 C) (!) 97.5 F (36.4 C) 98 F (36.7 C)   TempSrc: Oral Oral Oral   SpO2: 94% 99% 98%   Weight:    81.6 kg  Height:       Physical exam Chronically ill-appearing, lying in bed. Jaundiced skin RRR, no murmurs. Abdomen mildly distended Awake, follows commands.  At baseline Extremities without edema, noted hyperkeratotic rash on lower extremities  Labs: Basic Metabolic Panel: Recent Labs  Lab 11/23/23 0903 11/23/23 0922 11/24/23 0546 11/25/23 0424 11/26/23 0426 11/27/23 0205 11/28/23 0604 11/29/23 0452  NA 135 137 138 133* 131* 132* 135 137  K 4.2 4.2 3.9 3.5 3.7 3.8 3.5 3.7  CL 102 106 106 106 104 103 105 106  CO2 18*  --  18* 19* 19* 20* 21* 21*  GLUCOSE 98 92 89 132* 122* 141* 118* 135*  BUN 30* 33* 29* 30* 33* 34* 29* 27*  CREATININE 2.23* 2.30* 2.09* 2.12* 2.21* 1.99* 1.51* 1.61*  ALBUMIN  2.5*  --  2.9* 2.6* 2.5* 2.7* 2.9* 3.2*  CALCIUM 8.6*  --  8.6* 8.1* 8.1* 8.3* 8.2* 8.8*   Liver Function Tests: Recent Labs  Lab 11/27/23 0205 11/28/23 0604 11/29/23 0452  AST  24 29 31   ALT 13 13 16   ALKPHOS 30* 32* 32*  BILITOT 3.9* 3.3* 3.8*  PROT 5.0* 5.3* 5.6*  ALBUMIN  2.7* 2.9* 3.2*   Recent Labs  Lab 11/23/23 0903  LIPASE 41   Recent Labs  Lab 11/23/23 1529  AMMONIA 76*   CBC: Recent Labs  Lab 11/23/23 0903 11/23/23 0922 11/26/23 0426 11/27/23 0205 11/28/23 0604 11/29/23 0452  WBC 13.0*   < > 13.7* 10.5 10.7* 9.2  NEUTROABS 10.3*  --   --   --   --   --   HGB 12.2*   < > 11.8* 11.0* 11.6* 11.7*  HCT 37.0*   < > 34.6* 33.0* 33.8* 34.5*  MCV 96.6   < > 94.8 95.7 95.5 95.3  PLT 198   < > 154 114* 121* 106*   < > = values in this interval not displayed.   PT/INR: @LABRCNTIP (inr:5) Cardiac Enzymes: )No results for input(s): CKTOTAL, CKMB, CKMBINDEX, TROPONINI in the last 168 hours. CBG: No results for input(s): GLUCAP in the last 168 hours.  Iron Studies: No results for input(s): IRON, TIBC, TRANSFERRIN, FERRITIN in the last 168 hours.  Xrays/Other Studies: No results found.   Assessment/Plan: Acute Kidney Injury  Concern for HRS  Treating HRS with midodrine, octreotide and albumin , renal labs stable today. -Strict I's and O's, daily weight, BMP. - Continue midodrine 30 mg TID, octreotide  200 mg TID, Albumin  40g qd - Continue treatment as above.  Non Gap Metabolic acidosis: Mild: Stable Likely combination of AKI and diarrhea. Monitor for now  Decompensated cirrhosis: GI following.  Receiving repeat paracentesis.  Possible repeat paracentesis tomorrow.  On lactulose  and rifampin.  Per GI, TIPS not option due to high MELD.  James Downs 11/29/2023, 7:56 AM

## 2023-11-29 NOTE — Assessment & Plan Note (Signed)
 Continue current medication with expectation of full effect in 4 to 6 weeks, recommend outpatient CBT. - Continue Escitalopram 5mg  daily

## 2023-11-29 NOTE — Assessment & Plan Note (Signed)
 Stable, no changes. - AM CBC

## 2023-11-29 NOTE — Progress Notes (Signed)
 Subjective: No complaints.  Objective: Vital signs in last 24 hours: Temp:  [97.5 F (36.4 C)-98 F (36.7 C)] 97.8 F (36.6 C) (10/05 0857) Pulse Rate:  [57-69] 67 (10/05 0857) Resp:  [15-17] 16 (10/05 0857) BP: (108-137)/(69-77) 117/71 (10/05 0857) SpO2:  [94 %-99 %] 98 % (10/05 0857) Weight:  [81.6 kg] 81.6 kg (10/05 0457) Last BM Date : 11/27/23 (flexecil)  Intake/Output from previous day: 10/04 0701 - 10/05 0700 In: 150 [IV Piggyback:150] Out: 2125 [Urine:1125; Stool:1000] Intake/Output this shift: No intake/output data recorded.  General appearance: alert and slowed mentation GI: distended with ascites  Lab Results: Recent Labs    11/27/23 0205 11/28/23 0604 11/29/23 0452  WBC 10.5 10.7* 9.2  HGB 11.0* 11.6* 11.7*  HCT 33.0* 33.8* 34.5*  PLT 114* 121* 106*   BMET Recent Labs    11/27/23 0205 11/28/23 0604 11/29/23 0452  NA 132* 135 137  K 3.8 3.5 3.7  CL 103 105 106  CO2 20* 21* 21*  GLUCOSE 141* 118* 135*  BUN 34* 29* 27*  CREATININE 1.99* 1.51* 1.61*  CALCIUM 8.3* 8.2* 8.8*   LFT Recent Labs    11/29/23 0452  PROT 5.6*  ALBUMIN  3.2*  AST 31  ALT 16  ALKPHOS 32*  BILITOT 3.8*   PT/INR Recent Labs    11/28/23 0604 11/29/23 0452  LABPROT 24.4* 24.7*  INR 2.1* 2.1*   Hepatitis Panel No results for input(s): HEPBSAG, HCVAB, HEPAIGM, HEPBIGM in the last 72 hours. C-Diff No results for input(s): CDIFFTOX in the last 72 hours. Fecal Lactopherrin No results for input(s): FECLLACTOFRN in the last 72 hours.  Studies/Results: No results found.  Medications: Scheduled:  enoxaparin (LOVENOX) injection  40 mg Subcutaneous Q24H   escitalopram  5 mg Oral Daily   feeding supplement  237 mL Oral BID BM   lactulose   30 g Oral TID   midodrine  30 mg Oral TID WC   octreotide  200 mcg Subcutaneous TID   pantoprazole  40 mg Oral BID AC   rifaximin  550 mg Oral BID   Continuous:  albumin  human Stopped (11/28/23 1317)     Assessment/Plan: 1) Decompensated MASH cirrhosis. 2) Ascites. 3) HRS versus AKI.   His creatinine made a significant trend towards improvement.  He is now at a value where spironolactone  can be started.  Adding furosemide  at this time may be too adverse to his renal function.  Plan: 1) Spironolactone  100 mg every day. 2) Continue to monitor creatinine. 3) Strict <2000 mg sodium diet.  He and his wife were counseled about serving size and the number of servings and how it factors into the sodium goal. 4) Continue to maintain midodrine, octreotide, lactulose , and rifaximin.  LOS: 6 days   Nezar Buckles D 11/29/2023, 9:30 AM

## 2023-11-29 NOTE — Assessment & Plan Note (Signed)
 No new concerns regarding lesions today.  - treating cirrhosis as above

## 2023-11-29 NOTE — Plan of Care (Signed)

## 2023-11-29 NOTE — Progress Notes (Signed)
 Daily Progress Note Intern Pager: [phone]  Patient name: James Downs Medical record number: [id number] Date of birth: 04/10/1965 Age: 58 y.o. Gender: male  Primary Care Provider: Patient, No Pcp Per Consultants: GI, palliative medicine Code Status: DNR limited  Pt Overview and Major Events to Date:  9/29: Admitted  Medical Decision Making:  This is a 58 year old male patient who presented with altered mental status and was found to be secondary to decompensated liver cirrhosis with hepatorenal syndrome, esophageal varices, and hepatic encephalopathy.  PMH includes NASH, varices, gastric ulcer, portal hypertension, HLD, essential hypertension, GAD, tobacco use, OSA, nephrolithiasis.  Patient is clinically stable at this time however we are continuing ongoing goals of care conversations regarding the severity of his condition and minimal options for further treatment. Assessment & Plan Decompensated cirrhosis (HCC) Hepatorenal syndrome (HCC) Esophageal varices in cirrhosis (HCC) Altered mental state Patient remains fully oriented today and conversant.  Per GI who was present in the room on my exam this morning, they will be referring him to transplant though have made no promises as to the outcomes.  Will continue with symptomatic management with goal of discharge in the next couple days - GI consulted, appreciate recommendations  - Continue Lactulose  to 30mg  TID - Continue Rifaximin 550 mg PO BID - Given spironolactone  100 mg daily -Could still consider therapeutic paracentesis if needed, but recommend limiting due to increase in risk for SBP - AM CMP, CBC, PT/INR - can consider therapeutic paracentesis tomorrow but not need today - PT/OT following - Continue delirium precautions - BCx pending but NG @ 4 days - CBC stable, continue Lovenox AKI (acute kidney injury) Bump in creatinine from 1.51-1.61, will avoid any further changes to medications given initiation of  spironolactone  treatment today by GI.  Continue to monitor. - nephrology following, appreciate recs - continue midodrine at 30 mg TID today given improving creatinine despite not at goal MAP - continue octreotide 200 mg TID - strict I&Os, daily weights - AM CMP Black stool Stable, no changes - AM CBC Reactive perforating collagenosis No new concerns regarding lesions today.  - treating cirrhosis as above Depression Continue current medication with expectation of full effect in 4 to 6 weeks, recommend outpatient CBT. - Continue Escitalopram 5mg  daily  FEN/GI: Sodium restriction PPx: Lovenox Dispo:SNF pending clinical improvement .    Subjective:  Reports feeling well other than complaining of hiccups on exam.  Overall reports mild abdominal tenderness but no significant change from yesterday.  Objective: Temp:  [97.5 F (36.4 C)-98 F (36.7 C)] 97.8 F (36.6 C) (10/05 0857) Pulse Rate:  [57-69] 67 (10/05 0857) Resp:  [15-17] 16 (10/05 0857) BP: (108-137)/(69-77) 117/71 (10/05 0857) SpO2:  [94 %-99 %] 98 % (10/05 0857) Weight:  [81.6 kg] 81.6 kg (10/05 0457) Physical Exam: General: Ill-appearing gentleman, appears older than stated age Cardiovascular: RRR, no M/R/G Respiratory: CTAB, no increased work of breathing Abdomen: Soft, mildly tender across the right upper quadrant, fluid wave present along with distention. Extremities: No evidence of pitting edema, warm and well-perfused  Laboratory: Most recent CBC Lab Results  Component Value Date   WBC 9.2 11/29/2023   HGB 11.7 (L) 11/29/2023   HCT 34.5 (L) 11/29/2023   MCV 95.3 11/29/2023   PLT 106 (L) 11/29/2023   Most recent BMP    Latest Ref Rng & Units 11/29/2023    4:52 AM  BMP  Glucose 70 - 99 mg/dL 864   BUN 6 - 20 mg/dL  27   Creatinine 0.61 - 1.24 mg/dL 8.38   Sodium 864 - 854 mmol/L 137   Potassium 3.5 - 5.1 mmol/L 3.7   Chloride 98 - 111 mmol/L 106   CO2 22 - 32 mmol/L 21   Calcium 8.9 - 10.3 mg/dL  8.8     Cleotilde Lukes, DO 11/29/2023, 9:03 AM  PGY-2, [facility] FPTS Intern pager: [phone], text pages welcome Secure chat group [facility] Teaching Service

## 2023-11-30 DIAGNOSIS — Z515 Encounter for palliative care: Secondary | ICD-10-CM | POA: Diagnosis not present

## 2023-11-30 DIAGNOSIS — K7290 Hepatic failure, unspecified without coma: Secondary | ICD-10-CM | POA: Diagnosis not present

## 2023-11-30 DIAGNOSIS — K7682 Hepatic encephalopathy: Secondary | ICD-10-CM | POA: Diagnosis not present

## 2023-11-30 DIAGNOSIS — N179 Acute kidney failure, unspecified: Secondary | ICD-10-CM | POA: Diagnosis not present

## 2023-11-30 DIAGNOSIS — K7460 Unspecified cirrhosis of liver: Secondary | ICD-10-CM | POA: Diagnosis not present

## 2023-11-30 LAB — COMPREHENSIVE METABOLIC PANEL WITH GFR
ALT: 16 U/L (ref 0–44)
AST: 30 U/L (ref 15–41)
Albumin: 3 g/dL — ABNORMAL LOW (ref 3.5–5.0)
Alkaline Phosphatase: 29 U/L — ABNORMAL LOW (ref 38–126)
Anion gap: 10 (ref 5–15)
BUN: 24 mg/dL — ABNORMAL HIGH (ref 6–20)
CO2: 19 mmol/L — ABNORMAL LOW (ref 22–32)
Calcium: 8.4 mg/dL — ABNORMAL LOW (ref 8.9–10.3)
Chloride: 106 mmol/L (ref 98–111)
Creatinine, Ser: 1.45 mg/dL — ABNORMAL HIGH (ref 0.61–1.24)
GFR, Estimated: 56 mL/min — ABNORMAL LOW (ref >60)
Glucose, Bld: 141 mg/dL — ABNORMAL HIGH (ref 70–99)
Potassium: 3.6 mmol/L (ref 3.5–5.1)
Sodium: 135 mmol/L (ref 135–145)
Total Bilirubin: 2.7 mg/dL — ABNORMAL HIGH (ref 0.0–1.2)
Total Protein: 5.2 g/dL — ABNORMAL LOW (ref 6.5–8.1)

## 2023-11-30 LAB — CBC
HCT: 31.3 % — ABNORMAL LOW (ref 39.0–52.0)
Hemoglobin: 10.7 g/dL — ABNORMAL LOW (ref 13.0–17.0)
MCH: 33.1 pg (ref 26.0–34.0)
MCHC: 34.2 g/dL (ref 30.0–36.0)
MCV: 96.9 fL (ref 80.0–100.0)
Platelets: 94 K/uL — ABNORMAL LOW (ref 150–400)
RBC: 3.23 MIL/uL — ABNORMAL LOW (ref 4.22–5.81)
RDW: 16.4 % — ABNORMAL HIGH (ref 11.5–15.5)
WBC: 9.1 K/uL (ref 4.0–10.5)
nRBC: 0 % (ref 0.0–0.2)

## 2023-11-30 LAB — PROTIME-INR
INR: 2.2 — ABNORMAL HIGH (ref 0.8–1.2)
Prothrombin Time: 25.2 s — ABNORMAL HIGH (ref 11.4–15.2)

## 2023-11-30 LAB — MAGNESIUM: Magnesium: 2.2 mg/dL (ref 1.7–2.4)

## 2023-11-30 MED ORDER — METOCLOPRAMIDE HCL 10 MG PO TABS
10.0000 mg | ORAL_TABLET | Freq: Three times a day (TID) | ORAL | Status: DC
  Administered 2023-11-30 – 2023-12-01 (×3): 10 mg via ORAL
  Filled 2023-11-30 (×3): qty 1

## 2023-11-30 NOTE — Progress Notes (Signed)
 Subjective: Patient seems more alert and oriented to time and place today; he is more conversant as well.  He denies having nausea or vomiting or abdominal pain he is urinating more frequently with the diuretics he is on.  He had a bowel movement earlier today with no evidence of blood or mucus in the stool.  Objective: Vital signs in last 24 hours: Temp:  [97.9 F (36.6 C)-98.7 F (37.1 C)] 98.7 F (37.1 C) (10/06 0817) Pulse Rate:  [60-77] 67 (10/06 0817) Resp:  [15-18] 18 (10/06 0817) BP: (105-115)/(61-72) 109/72 (10/06 0817) SpO2:  [96 %-100 %] 96 % (10/06 0817) Last BM Date : 11/29/23  Intake/Output from previous day: 10/05 0701 - 10/06 0700 In: 30  Out: 1300 [Urine:500; Stool:800] Intake/Output this shift: No intake/output data recorded.  General appearance: alert, cooperative, fatigued, icteric, and no distress Resp: clear to auscultation bilaterally Cardio: regular rate and rhythm, S1, S2 normal, no murmur, click, rub or gallop GI: soft, non-tender; bowel sounds normal; no masses,  no organomegaly  Lab Results: Recent Labs    11/28/23 0604 11/29/23 0452 11/30/23 0453  WBC 10.7* 9.2 9.1  HGB 11.6* 11.7* 10.7*  HCT 33.8* 34.5* 31.3*  PLT 121* 106* 94*   BMET Recent Labs    11/28/23 0604 11/29/23 0452 11/30/23 0453  NA 135 137 135  K 3.5 3.7 3.6  CL 105 106 106  CO2 21* 21* 19*  GLUCOSE 118* 135* 141*  BUN 29* 27* 24*  CREATININE 1.51* 1.61* 1.45*  CALCIUM 8.2* 8.8* 8.4*   LFT Recent Labs    11/30/23 0453  PROT 5.2*  ALBUMIN  3.0*  AST 30  ALT 16  ALKPHOS 29*  BILITOT 2.7*   PT/INR Recent Labs    11/29/23 0452 11/30/23 0453  LABPROT 24.7* 25.2*  INR 2.1* 2.2*   Medications: I have reviewed the patient's current medications. Prior to Admission:  Medications Prior to Admission  Medication Sig Dispense Refill Last Dose/Taking   aspirin EC 81 MG tablet Take 81 mg by mouth daily. Swallow whole.   11/22/2023 Morning   carvedilol  (COREG ) 6.25  MG tablet Take 6.25 mg by mouth 2 (two) times daily. (Patient not taking: Reported on 11/23/2023)   Not Taking   furosemide  (LASIX ) 40 MG tablet Take 1 tablet (40 mg total) by mouth daily. (Patient not taking: Reported on 11/23/2023) 30 tablet 0 Not Taking   lactulose  (CHRONULAC ) 10 GM/15ML solution Take 30 mLs (20 g total) by mouth 2 (two) times daily. (Patient not taking: Reported on 11/23/2023) 1800 mL 0 Not Taking   spironolactone  (ALDACTONE ) 100 MG tablet Take 1 tablet (100 mg total) by mouth daily. (Patient not taking: Reported on 11/23/2023) 30 tablet 0 Not Taking   Scheduled:  enoxaparin (LOVENOX) injection  40 mg Subcutaneous Q24H   escitalopram  5 mg Oral Daily   feeding supplement  237 mL Oral BID BM   lactulose   30 g Oral TID   metoCLOPramide  10 mg Oral TID AC   midodrine  30 mg Oral TID WC   octreotide  200 mcg Subcutaneous TID   pantoprazole  40 mg Oral BID AC   rifaximin  550 mg Oral BID   spironolactone   100 mg Oral Daily   Continuous:  albumin  human 37.5 g (11/30/23 0930)   PRN:acetaminophen  **OR** acetaminophen   Assessment/Plan: 1) Decompensated MASH cirrhosis with hepatic encephalopathy ascites and esophageal varices-seems to be improving on Lactulose  and Xifaxan along with Midodrine and Spironolactone . 2) Acute kidney injury  superimposed on chronic kidney disease-risk for hepatorenal syndrome. 3) Rectal bleeding with portal colopathy noted on CT scan-resolved  4) Extensive nephrolithiasis without any evidence of obstructive uropathy 5) Skin rash over lower legs. 6) Contracted gallbladder with cholelithiasis 7) Limited code; patient does not desire intubation if need arises in the future.  LOS: 2 days   LOS: 7 days   Ambriella Kitt 11/30/2023, 11:00 AM

## 2023-11-30 NOTE — Assessment & Plan Note (Signed)
 No new concerns regarding lesions today.  - treating cirrhosis as above

## 2023-11-30 NOTE — Assessment & Plan Note (Addendum)
 Creatinine 1.61--> 1.45. Continue to monitor. - nephrology following, appreciate recs - continue midodrine at 30 mg TID today given improving creatinine despite not at goal MAP - continue octreotide 200 mcg TID - strict I&Os, daily weights - AM CMP

## 2023-11-30 NOTE — Progress Notes (Signed)
  [facility] Progress Note   Subjective:   No new concerns.  Multiple family members . UOP yesterday.    Objective Vitals:   11/29/23 2008 11/30/23 0027 11/30/23 0406 11/30/23 0817  BP: 110/66 115/65 110/64 109/72  Pulse: 69 66 72 67  Resp: 18 15  18   Temp: 98.1 F (36.7 C) 98.1 F (36.7 C) 98.4 F (36.9 C) 98.7 F (37.1 C)  TempSrc: Oral Oral Oral Oral  SpO2: 98% 97% 97% 96%  Weight:      Height:       Physical Exam Chronically ill-appearing, lying in bed. Jaundiced skin RRR, no murmurs. Abdomen moderately distended Awake, follows commands.  At baseline Extremities without edema, noted hyperkeratotic rash on lower extremities  Additional Objective Labs: Basic Metabolic Panel: Recent Labs  Lab 11/28/23 0604 11/29/23 0452 11/30/23 0453  NA 135 137 135  K 3.5 3.7 3.6  CL 105 106 106  CO2 21* 21* 19*  GLUCOSE 118* 135* 141*  BUN 29* 27* 24*  CREATININE 1.51* 1.61* 1.45*  CALCIUM 8.2* 8.8* 8.4*   Liver Function Tests: Recent Labs  Lab 11/28/23 0604 11/29/23 0452 11/30/23 0453  AST 29 31 30   ALT 13 16 16   ALKPHOS 32* 32* 29*  BILITOT 3.3* 3.8* 2.7*  PROT 5.3* 5.6* 5.2*  ALBUMIN  2.9* 3.2* 3.0*   No results for input(s): LIPASE, AMYLASE in the last 168 hours. CBC: Recent Labs  Lab 11/26/23 0426 11/27/23 0205 11/28/23 0604 11/29/23 0452 11/30/23 0453  WBC 13.7* 10.5 10.7* 9.2 9.1  HGB 11.8* 11.0* 11.6* 11.7* 10.7*  HCT 34.6* 33.0* 33.8* 34.5* 31.3*  MCV 94.8 95.7 95.5 95.3 96.9  PLT 154 114* 121* 106* 94*   Blood Culture    Component Value Date/Time   SDES PERITONEAL 11/23/2023 1245   SPECREQUEST ABDOMEN 11/23/2023 1245   CULT  11/23/2023 1245    NO GROWTH 3 DAYS Performed at [facility], [address], [place], [place] [address]    REPTSTATUS 11/26/2023 FINAL 11/23/2023 1245    Cardiac Enzymes: No results for input(s): CKTOTAL, CKMB, CKMBINDEX, TROPONINI in the last 168 hours. CBG: No results  for input(s): GLUCAP in the last 168 hours. Iron Studies: No results for input(s): IRON, TIBC, TRANSFERRIN, FERRITIN in the last 72 hours. @lablastinr3 @ Studies/Results: No results found. 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  Family aware that in the setting of severe cirrhosis risk for recurrent AKI is high. -Strict I's and O's, daily weight, BMP. - Continue midodrine 30 mg TID, octreotide  200 mg TID - GI adding back spironolactone  today but holding on lasix  given AKI, very responable -no role for ongoing daily albumin  BUT if having LVP would supplement with IV albumin     Non Gap Metabolic acidosis: Mild: Stable Likely combination of AKI and diarrhea.    Decompensated cirrhosis: GI following closely and adjusting meds.  Per  notes being referred to transplant center for eval.  On lactulose  and rifampin.  Per GI, TIPS not option due to high MELD.   With kidney function improving nothing further to add, will sign off.  GI managing meds.   Manuelita Barters MD 11/30/2023, 11:16 AM  [facility] Pager: [phone]

## 2023-11-30 NOTE — Assessment & Plan Note (Addendum)
 Stable, no changes. - AM CBC

## 2023-11-30 NOTE — Assessment & Plan Note (Addendum)
 Afebrile, hemodynamically stable.  Continued GOC conversations. - GI consulted, appreciate recommendations  - Continue Lactulose  to 30mg  TID - Continue Rifaximin 550 mg PO BID - Continue spironolactone  100 mg daily -Could still consider therapeutic paracentesis if needed, but recommend limiting due to increase in risk for SBP - AM CMP, CBC - can consider therapeutic paracentesis tomorrow but not need today - PT/OT following - Continue delirium precautions - BCx pending but NG @ 5 days - CBC stable, continue Lovenox

## 2023-11-30 NOTE — Plan of Care (Signed)

## 2023-11-30 NOTE — Progress Notes (Signed)
 Physical Therapy Treatment Patient Details Name: James Downs MRN: [id number] DOB: [birth year] Today's Date: 11/30/2023   History of Present Illness 58 year old male presenting 9/29 with weakness, abdominal pain, diarrhea, and several days of altered mental status. Dx Hepatic encephalopathy, Decompensated NASH cirrhosis, Ascites, AKI with CKD. PMHof non-alcoholic cirrhosis, HTN, HLD, and nephrolithiasis,    PT Comments  Pt with some regression towards his physical therapy goals with generalized weakness and decreased cardiopulmonary endurance. Pt requiring min assist for bed mobility and transitions to standing, but is unable to ambulate due to fatigue. Performed multiple sit to stands from edge of bed and able to take lateral steps up edge of bed but unable to walk around foot of bed over to chair. In light of this, updating d/c plan to continued inpatient follow up therapy, <3 hours/day; pt and pt spouse in agreement.     If plan is discharge home, recommend the following: A little help with bathing/dressing/bathroom;A lot of help with walking and/or transfers;Assistance with cooking/housework;Direct supervision/assist for medications management;Direct supervision/assist for financial management;Assist for transportation   Can travel by private vehicle     Yes  Equipment Recommendations  None recommended by PT    Recommendations for Other Services       Precautions / Restrictions Precautions Precautions: Fall Recall of Precautions/Restrictions: Impaired Precaution/Restrictions Comments: Rectal tube Restrictions Weight Bearing Restrictions Per Provider Order: No     Mobility  Bed Mobility Overal bed mobility: Needs Assistance Bed Mobility: Supine to Sit, Sit to Supine     Supine to sit: Min assist Sit to supine: Mod assist   General bed mobility comments: Pt able to initiate progression to edge of bed, minA for trunk elevation to upright. ModA to return BLE's into bed     Transfers Overall transfer level: Needs assistance Equipment used: Rolling walker (2 wheels) Transfers: Sit to/from Stand Sit to Stand: Min assist           General transfer comment: MinA to stand from edge of bed to RW, limited standing tolerance. Able to stand x 3, take lateral steps up the side of bed    Ambulation/Gait                   Stairs             Wheelchair Mobility     Tilt Bed    Modified Rankin (Stroke Patients Only)       Balance Overall balance assessment: Needs assistance Sitting-balance support: No upper extremity supported, Feet supported Sitting balance-Leahy Scale: Good     Standing balance support: Bilateral upper extremity supported, During functional activity, Reliant on assistive device for balance Standing balance-Leahy Scale: Poor                              Communication Communication Communication: No apparent difficulties  Cognition Arousal: Alert Behavior During Therapy: Flat affect   PT - Cognitive impairments: Sequencing, Problem solving                         Following commands: Impaired Following commands impaired: Follows multi-step commands with increased time    Cueing Cueing Techniques: Verbal cues, Tactile cues, Visual cues  Exercises      General Comments        Pertinent Vitals/Pain Pain Assessment Pain Assessment: No/denies pain    Home Living  Prior Function            PT Goals (current goals can now be found in the care plan section) Acute Rehab PT Goals Patient Stated Goal: agreeable to short term rehab Potential to Achieve Goals: Fair Progress towards PT goals: Not progressing toward goals - comment    Frequency    Min 2X/week      PT Plan      Co-evaluation              AM-PAC PT 6 Clicks Mobility   Outcome Measure  Help needed turning from your back to your side while in a flat bed without using  bedrails?: A Little Help needed moving from lying on your back to sitting on the side of a flat bed without using bedrails?: A Little Help needed moving to and from a bed to a chair (including a wheelchair)?: A Little Help needed standing up from a chair using your arms (e.g., wheelchair or bedside chair)?: A Little Help needed to walk in hospital room?: Total Help needed climbing 3-5 steps with a railing? : Total 6 Click Score: 14    End of Session Equipment Utilized During Treatment: Gait belt Activity Tolerance: Patient limited by fatigue Patient left: in bed;with call bell/phone within reach;with bed alarm set Nurse Communication: Mobility status PT Visit Diagnosis: Unsteadiness on feet (R26.81);Other abnormalities of gait and mobility (R26.89);Muscle weakness (generalized) (M62.81);Difficulty in walking, not elsewhere classified (R26.2);Other symptoms and signs involving the nervous system (R29.898)     Time: 9062-8985 PT Time Calculation (min) (ACUTE ONLY): 37 min  Charges:    $Therapeutic Activity: 23-37 mins PT General Charges $$ ACUTE PT VISIT: 1 Visit                     Aleck Daring, PT, DPT Acute Rehabilitation Services Office [phone]    Aleck ONEIDA Daring 11/30/2023, 10:20 AM

## 2023-11-30 NOTE — Progress Notes (Signed)
 This chaplain responded to RN-Linh STAT consult for creating/updating the Pt. Advance Directive. The Pt. is awake and ready to participate in AD education. The Pt. family-daughter in law, wife, and son are visiting at the bedside.  The chaplain provided AD education. The Pt. answered the chaplain's clarifying questions on HCPOA and Living Will. The chaplain understands the Pt. first choice for [facility]CP[facility] is [name] and second choice is [name]. The chaplain provided education on how to fill out the AD. The Pt. will ask the RN to connect with spiritual care when he is ready to complete the AD:  HCPOA and Living Will.  Chaplain Leeroy Hummer [phone]

## 2023-11-30 NOTE — TOC Progression Note (Signed)
 Transition of Care [facility]) - Progression Note    Patient Details  Name: DIYARI CHERNE MRN: [id number] Date of Birth: 1965-10-11  [facility] CM/SW Contact  Almarie CHRISTELLA [place], [place] Phone Number: 11/30/2023, 11:59 AM  Clinical Narrative:   CSW updated by medical team that recommendation has updated to SNF, patient in agreement. Referral completed, awaiting bed offers. CSW to follow.    Expected Discharge Plan: Skilled Nursing Facility Barriers to Discharge: English as a second language teacher, Continued Medical Work up, Inadequate or no insurance               Expected Discharge Plan and Services   Discharge Planning Services: CM Consult   Living arrangements for the past 2 months: Single Family Home                                       Social Drivers of Health (SDOH) Interventions SDOH Screenings   Food Insecurity: No Food Insecurity (11/24/2023)  Housing: Unknown (11/24/2023)  Transportation Needs: No Transportation Needs (11/24/2023)  Utilities: Not At Risk (11/24/2023)  Tobacco Use: High Risk (08/11/2023)    Readmission Risk Interventions     No data to display

## 2023-11-30 NOTE — Assessment & Plan Note (Signed)
 Continue current medication with expectation of full effect in 4 to 6 weeks, recommend outpatient CBT. - Continue Escitalopram 5mg  daily

## 2023-11-30 NOTE — Progress Notes (Signed)
     Daily Progress Note Intern Pager: [phone]  Patient name: James Downs Medical record number: [id number] Date of birth: 01/14/1966 Age: 58 y.o. Gender: male  Primary Care Provider: Patient, No Pcp Per Consultants: GI, palliative  Code Status: DNR limited   Pt Overview and Major Events to Date:  9/29: admitted   Medical Decision Making:  TEREK BEE is a 58 year old male with a PMH of NASH, varices, gastric ulcer, portal hypertension, HLD, essential hypertension, GAD, tobacco use, OSA, and nephrolithiasis who presented with AMS found to be 2/2 decompensated liver cirrhosis with hepatorenal syndrome, esophageal varices, and hepatic encephalopathy.  Assessment & Plan Decompensated cirrhosis (HCC) Hepatorenal syndrome (HCC) Esophageal varices in cirrhosis (HCC) Altered mental state (Resolved: 11/30/2023) Afebrile, hemodynamically stable.  Continued GOC conversations. - GI consulted, appreciate recommendations  - Continue Lactulose  to 30mg  TID - Continue Rifaximin 550 mg PO BID - Continue spironolactone  100 mg daily -Could still consider therapeutic paracentesis if needed, but recommend limiting due to increase in risk for SBP - AM CMP, CBC - can consider therapeutic paracentesis tomorrow but not need today - PT/OT following - Continue delirium precautions - BCx pending but NG @ 5 days - CBC stable, continue Lovenox AKI (acute kidney injury) Creatinine 1.61--> 1.45. Continue to monitor. - nephrology following, appreciate recs - continue midodrine at 30 mg TID today given improving creatinine despite not at goal MAP - continue octreotide 200 mcg TID - strict I&Os, daily weights - AM CMP Black stool Stable, no changes. - AM CBC Reactive perforating collagenosis No new concerns regarding lesions today.  - treating cirrhosis as above Depression Continue current medication with expectation of full effect in 4 to 6 weeks, recommend outpatient CBT. - Continue  Escitalopram 5mg  daily   FEN/GI: Sodium restriction PPx: Lovenox  Dispo: SNF pending clinical improvement.   Subjective:  Patient states he is doing okay this morning. He denies any pain. Does not feel like his abdomen is more distended. He has no questions or concerns at this time.  Objective: Temp:  [97.8 F (36.6 C)-98.7 F (37.1 C)] 98.7 F (37.1 C) (10/06 0817) Pulse Rate:  [60-77] 67 (10/06 0817) Resp:  [15-18] 18 (10/06 0817) BP: (105-117)/(61-72) 109/72 (10/06 0817) SpO2:  [96 %-100 %] 96 % (10/06 0817) Physical Exam: General: NAD Cardiovascular: RRR, no m/r/g Respiratory: CTAB, normal work of breathing on room air Abdomen: soft, mildly tender in RUQ, no guarding, some distention present Extremities: warm, dry, no edema, SCDs in place  Laboratory: Most recent CBC Lab Results  Component Value Date   WBC 9.1 11/30/2023   HGB 10.7 (L) 11/30/2023   HCT 31.3 (L) 11/30/2023   MCV 96.9 11/30/2023   PLT 94 (L) 11/30/2023   Most recent BMP    Latest Ref Rng & Units 11/30/2023    4:53 AM  BMP  Glucose 70 - 99 mg/dL 858   BUN 6 - 20 mg/dL 24   Creatinine 9.38 - 1.24 mg/dL 8.54   Sodium 864 - 854 mmol/L 135   Potassium 3.5 - 5.1 mmol/L 3.6   Chloride 98 - 111 mmol/L 106   CO2 22 - 32 mmol/L 19   Calcium 8.9 - 10.3 mg/dL 8.4     Imaging/Diagnostic Tests: No new imaging.  Lennie Raguel MATSU, DO 11/30/2023, 8:24 AM  PGY-1, [facility] FPTS Intern pager: [phone], text pages welcome Secure chat group [facility] Teaching Service

## 2023-11-30 NOTE — NC FL2 (Signed)
 [place]  MEDICAID FL2 LEVEL OF CARE FORM     IDENTIFICATION  Patient Name: James Downs Birthdate: 04/26/1965 Sex: male Admission Date (Current Location): 11/23/2023  [facility] and [place] Number:  Producer, television/film/video and Address:  [facility], [address], [place], [place] [address]      Provider Number: [id number]  Attending Physician Name and Address:  Madelon Donald CHRISTELLA, DO  Relative Name and Phone Number:       Current Level of Care: Hospital Recommended Level of Care: Skilled Nursing Facility Prior Approval Number:    Date Approved/Denied:   PASRR Number: [id number]  Discharge Plan: SNF    Current Diagnoses: Patient Active Problem List   Diagnosis Date Noted   AKI (acute kidney injury) 11/28/2023   Depression 11/25/2023   SBP (spontaneous bacterial peritonitis) (HCC) 11/24/2023   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   T2DM (type 2 diabetes mellitus) (HCC) 11/23/2023   Decompensated cirrhosis (HCC) 11/23/2023   Hepatorenal syndrome (HCC) 11/23/2023   Black stool 11/23/2023   Esophageal varices in cirrhosis (HCC) 11/23/2023   Anasarca 05/19/2023   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   Weakness of left leg 11/04/2013   Paresthesias 11/04/2013   Lumbar radiculopathy 11/04/2013   Degenerative disc disease, lumbar 11/04/2013   ONYCHOMYCOSIS 12/23/2006   HYPERLIPIDEMIA 12/23/2006   ANXIETY DISORDER, GENERALIZED 12/23/2006   TOBACCO ABUSE 12/23/2006   SYNDROME, CARPAL TUNNEL 12/23/2006   HYPERTENSION 12/23/2006   NEPHROLITHIASIS, HX OF 12/23/2006    Orientation RESPIRATION BLADDER Height & Weight     Self, Time, Situation, Place  Normal Incontinent Weight: 180 lb (81.6 kg) Height:  5' 7 (170.2 cm)  BEHAVIORAL SYMPTOMS/MOOD NEUROLOGICAL BOWEL NUTRITION STATUS      Incontinent Diet (2 gram sodium)  AMBULATORY STATUS COMMUNICATION OF NEEDS Skin   Extensive Assist Verbally Normal                        Personal Care Assistance Level of Assistance  Bathing, Feeding, Dressing Bathing Assistance: Limited assistance Feeding assistance: Limited assistance Dressing Assistance: Limited assistance     Functional Limitations Info             SPECIAL CARE FACTORS FREQUENCY  PT (By licensed PT), OT (By licensed OT)     PT Frequency: 5x/wk OT Frequency: 5x/wk            Contractures Contractures Info: Not present    Additional Factors Info  Code Status, Allergies, Psychotropic Code Status Info: DNR Allergies Info: NKA Psychotropic Info: Lexapro 5mg  daily         Current Medications (11/30/2023):  This is the current hospital active medication list Current Facility-Administered Medications  Medication Dose Route Frequency Provider Last Rate Last Admin   acetaminophen  (TYLENOL ) tablet 650 mg  650 mg Oral Q6H PRN Lorrane Pac, MD   650 mg at 11/25/23 0932   Or   acetaminophen  (TYLENOL ) suppository 650 mg  650 mg Rectal Q6H PRN Lorrane Pac, MD       albumin  human 25 % solution 37.5 g  37.5 g Intravenous Daily Tharon Lung, MD 60 mL/hr at 11/30/23 0930 37.5 g at 11/30/23 0930   enoxaparin (LOVENOX) injection 40 mg  40 mg Subcutaneous Q24H Alena Morrison, Reagan, MD   40 mg at 11/29/23 1056   escitalopram (LEXAPRO) tablet 5 mg  5 mg Oral Daily Alena Morrison, Reagan, MD   5 mg at 11/30/23 [phone]  feeding supplement (ENSURE PLUS HIGH PROTEIN) liquid 237 mL  237 mL Oral BID BM Pruett, Kellner L, MD   237 mL at 11/29/23 1600   lactulose  (CHRONULAC ) 10 GM/15ML solution 30 g  30 g Oral TID Theophilus Pagan, MD   30 g at 11/30/23 0918   midodrine (PROAMATINE) tablet 30 mg  30 mg Oral TID WC Treadwell Smucker, Reagan, MD   30 mg at 11/30/23 0917   octreotide (SANDOSTATIN) injection 200 mcg  200 mcg Subcutaneous TID Tharon Lung, MD   200 mcg at 11/30/23 0918   pantoprazole (PROTONIX) EC tablet 40 mg  40 mg Oral BID AC Stevens, Briana N, MD   40 mg at  11/30/23 0917   rifaximin (XIFAXAN) tablet 550 mg  550 mg Oral BID Rollin Dover, MD   550 mg at 11/30/23 9082   spironolactone  (ALDACTONE ) tablet 100 mg  100 mg Oral Daily Rollin Dover, MD   100 mg at 11/30/23 9083     Discharge Medications: Please see discharge summary for a list of discharge medications.  Relevant Imaging Results:  Relevant Lab Results:   Additional Information SS#: [id number]  Almarie CHRISTELLA [place]ie, [place]

## 2023-11-30 NOTE — Progress Notes (Signed)
 Palliative Medicine Inpatient Follow Up Note   HPI:  58 y.o. male  with past medical history of non-alcoholic cirrhosis, esophageal varices, gastric ulcer, portal HTN, HLD, nephrolithiasis  admitted on 11/23/2023 with 2-3 days of weakness, abdominal pain, and diarrhea.    11/23/2023: Paracentesis done, 7.9 L  of ascitic fluid removed 11/25/2023: Paracentesis done, 8.3 L of ascitic fluid removed    PMT has been consulted to assist with goals of care conversation. Patient/Family face treatment option decisions, advanced directive decisions and anticipatory care needs.    Family face treatment option decision, advance directive decisions and anticipatory care needs.   Today's Discussion 11/30/2023  *Please note that this is a verbal dictation therefore any spelling or grammatical errors are due to the Dragon Medical One system interpretation.  Chart reviewed inclusive of vital signs, progress notes, laboratory results, and diagnostic images.   I visited the patient at bedside today. He appeared chronically ill but was not experiencing any acute distress. He was alert, communicative, and able to clearly articulate his needs. His wife, [name], was present during the encounter.  I provided an update on his treatment plan. Per GI recommendations, Aldactone  may now be initiated. The GI consult also noted that a referral to transplant is under consideration. While outcomes remain uncertain and no guarantees have been made, the patient expressed hope upon hearing this possibility and shared that it lifted his spirits. He denied any abdominal pressure and was in good spirits. I also informed him that his renal function continues to show improvement. He acknowledged this and stated he will continue to take things one day at a time.  We revisited goals of care during the visit. As of today, his goals remain unchanged--he continues to express hope for clinical improvement. I introduced the option of  outpatient palliative care, highlighting its benefits, including enhanced support for symptom management. He expressed interest in pursuing this, and I will place a TOC consult to facilitate the referral.  The patient reported experiencing intermittent hiccups, particularly around mealtimes. I observed a brief episode shortly after he took his morning medications. I discussed non-pharmacologic strategies such as breath holding and the Valsalva maneuver, and informed him of plans to initiate medication to help alleviate the hiccups. He expressed appreciation for this approach.  I encouraged the patient to maintain open communication with his medical team and family regarding his preferences, goals, and values, especially in the context of long-term planning.  Created space and opportunity for patient to explore thoughts feelings and fears regarding current medical situation.  Patient and her family face treatment option decisions, advanced directive decisions and anticipatory care needs.   Questions and concerns addressed   Palliative Support Provided.   Objective Assessment: Vital Signs Vitals:   11/30/23 0817 11/30/23 1255  BP: 109/72 (!) 126/96  Pulse: 67 73  Resp: 18 20  Temp: 98.7 F (37.1 C) 98.1 F (36.7 C)  SpO2: 96% 97%    Intake/Output Summary (Last 24 hours) at 11/30/2023 1401 Last data filed at 11/30/2023 1247 Gross per 24 hour  Intake --  Output 850 ml  Net -850 ml   Last Weight  Most recent update: 11/29/2023  4:58 AM    Weight  81.6 kg (180 lb)             Physical Exam: General: Alert, chronically-ill appearing, pale looking HEENT: No sign of trauma, EOM grossly intact. Cardiovascular: RRR, HR 61 BPM Pulmonary: Normal WOB. CTAB with no w/c/r present. Abdomen: Moderately  distended, fluid wave is present., flexiseal in place.  Extremities: Warm, 2+ edema in bilateral LE. Neurologic: No focal deficits, alert and oriented to person place and time. Skin: Warm  and dry.  SUMMARY OF RECOMMENDATIONS    Code Status: Maintain DNR/DNI status Goal of care currently is medical stabilization, allowing time for outcomes Reintroduced comfort-focused care as an option given the patient's decompensated liver cirrhosis. Patient remains undecided at this time. Discussed the importance of continued conversation with family and the medical providers regarding overall plan of care and treatment options, ensuring decisions are within the context of the patient's values and GOCs.  Patient expressed interest with outpatient Palliative Services, TOC consult placed to provide assistance.  Spiritual care consult to assist with advance directive creation (Consult placed 11/27/2023). Continue to provide psycho-social and emotional support to patient and family. Palliative medicine team will continue to follow.    Symptom Management: Per Primary team Added Metoclopramide 10mg  PO TID 30 minutes before meals for hiccups. Palliative medicine is available to assist as needed.    Time Spent: 50 minutes  Detailed review of medical records (labs, imaging, vital signs), medically appropriate exam, discussed with treatment team, counseling and education to patient, family, & staff, documenting clinical information, coordination of care.  ____________________________________________________________________ Kathlyne Bolder NP-C [place] Palliative Medicine Team Team Cell Phone: [phone] Please utilize secure chat with additional questions, if there is no response within 30 minutes please call the above phone number  Palliative Medicine Team providers are available by phone from 7am to 7pm daily and can be reached through the team cell phone.  Should this patient require assistance outside of these hours, please call the patient's attending physician.

## 2023-12-01 ENCOUNTER — Encounter (HOSPITAL_COMMUNITY): Payer: Self-pay

## 2023-12-01 DIAGNOSIS — Z515 Encounter for palliative care: Secondary | ICD-10-CM | POA: Diagnosis not present

## 2023-12-01 DIAGNOSIS — N179 Acute kidney failure, unspecified: Secondary | ICD-10-CM

## 2023-12-01 DIAGNOSIS — Z7189 Other specified counseling: Secondary | ICD-10-CM

## 2023-12-01 DIAGNOSIS — R188 Other ascites: Secondary | ICD-10-CM

## 2023-12-01 DIAGNOSIS — K7290 Hepatic failure, unspecified without coma: Secondary | ICD-10-CM | POA: Diagnosis not present

## 2023-12-01 DIAGNOSIS — K7460 Unspecified cirrhosis of liver: Secondary | ICD-10-CM | POA: Diagnosis not present

## 2023-12-01 DIAGNOSIS — K7682 Hepatic encephalopathy: Secondary | ICD-10-CM | POA: Diagnosis not present

## 2023-12-01 LAB — COMPREHENSIVE METABOLIC PANEL WITH GFR
ALT: 15 U/L (ref 0–44)
AST: 27 U/L (ref 15–41)
Albumin: 3.2 g/dL — ABNORMAL LOW (ref 3.5–5.0)
Alkaline Phosphatase: 26 U/L — ABNORMAL LOW (ref 38–126)
Anion gap: 10 (ref 5–15)
BUN: 22 mg/dL — ABNORMAL HIGH (ref 6–20)
CO2: 21 mmol/L — ABNORMAL LOW (ref 22–32)
Calcium: 8.7 mg/dL — ABNORMAL LOW (ref 8.9–10.3)
Chloride: 105 mmol/L (ref 98–111)
Creatinine, Ser: 1.54 mg/dL — ABNORMAL HIGH (ref 0.61–1.24)
GFR, Estimated: 52 mL/min — ABNORMAL LOW (ref >60)
Glucose, Bld: 118 mg/dL — ABNORMAL HIGH (ref 70–99)
Potassium: 3.5 mmol/L (ref 3.5–5.1)
Sodium: 136 mmol/L (ref 135–145)
Total Bilirubin: 3.1 mg/dL — ABNORMAL HIGH (ref 0.0–1.2)
Total Protein: 5.4 g/dL — ABNORMAL LOW (ref 6.5–8.1)

## 2023-12-01 LAB — CBC
HCT: 31.4 % — ABNORMAL LOW (ref 39.0–52.0)
Hemoglobin: 10.7 g/dL — ABNORMAL LOW (ref 13.0–17.0)
MCH: 33 pg (ref 26.0–34.0)
MCHC: 34.1 g/dL (ref 30.0–36.0)
MCV: 96.9 fL (ref 80.0–100.0)
Platelets: 93 K/uL — ABNORMAL LOW (ref 150–400)
RBC: 3.24 MIL/uL — ABNORMAL LOW (ref 4.22–5.81)
RDW: 16.6 % — ABNORMAL HIGH (ref 11.5–15.5)
WBC: 9.5 K/uL (ref 4.0–10.5)
nRBC: 0 % (ref 0.0–0.2)

## 2023-12-01 MED ORDER — MELATONIN 3 MG PO TABS
3.0000 mg | ORAL_TABLET | Freq: Every day | ORAL | Status: DC
  Administered 2023-12-01 – 2023-12-10 (×10): 3 mg via ORAL
  Filled 2023-12-01 (×10): qty 1

## 2023-12-01 MED ORDER — FUROSEMIDE 40 MG PO TABS
40.0000 mg | ORAL_TABLET | Freq: Every day | ORAL | Status: DC
Start: 2023-12-01 — End: 2023-12-02
  Administered 2023-12-01 – 2023-12-02 (×2): 40 mg via ORAL
  Filled 2023-12-01 (×2): qty 1

## 2023-12-01 MED ORDER — SPIRONOLACTONE 100 MG PO TABS
200.0000 mg | ORAL_TABLET | Freq: Every day | ORAL | Status: DC
  Administered 2023-12-02 – 2023-12-03 (×2): 200 mg via ORAL
  Filled 2023-12-01 (×2): qty 2

## 2023-12-01 NOTE — Progress Notes (Signed)
 Occupational Therapy Treatment Patient Details Name: James Downs MRN: [id number] DOB: [birth year] Today's Date: 12/01/2023   History of present illness 58 year old male presenting 9/29 with weakness, abdominal pain, diarrhea, and several days of altered mental status. Dx Hepatic encephalopathy, Decompensated NASH cirrhosis, Ascites, AKI with CKD. PMHof non-alcoholic cirrhosis, HTN, HLD, and nephrolithiasis,   OT comments  Pt greeted in supine, agreeable for OT visit. Wife asleep at bedside. Pt progressed to grooming at EOB with supervision, needing inc time to complete. Transferred with mod A and took several steps along EOB with RW and min A. Fatigues rapidly, requiring seated rest break and requesting to lay back down. Due to functional regression, updating recommendation to post-acute rehab (< 3 hours/day). Will continue to follow and progress as able.      If plan is discharge home, recommend the following:  A little help with walking and/or transfers;A lot of help with bathing/dressing/bathroom;Assistance with cooking/housework;Help with stairs or ramp for entrance;Assist for transportation;Supervision due to cognitive status   Equipment Recommendations  Other (comment) (defer)    Recommendations for Other Services      Precautions / Restrictions Precautions Precautions: Fall Recall of Precautions/Restrictions: Impaired Precaution/Restrictions Comments: rectal tube Restrictions Weight Bearing Restrictions Per Provider Order: No       Mobility Bed Mobility Overal bed mobility: Needs Assistance Bed Mobility: Supine to Sit, Sit to Supine     Supine to sit: Min assist, HOB elevated, Used rails Sit to supine: Min assist, HOB elevated, Used rails   General bed mobility comments: assist for trunk elevation, reliant on bed features    Transfers Overall transfer level: Needs assistance Equipment used: Rolling walker (2 wheels) Transfers: Sit to/from Stand Sit to Stand:  Mod assist           General transfer comment: Stood from bed height with cueing for hand placement & powering up. Took several steps towards HOB via RW with min A.     Balance Overall balance assessment: Needs assistance Sitting-balance support: No upper extremity supported, Feet supported Sitting balance-Leahy Scale: Good Sitting balance - Comments: seated EOB   Standing balance support: Bilateral upper extremity supported, During functional activity, Reliant on assistive device for balance Standing balance-Leahy Scale: Poor Standing balance comment: up to min A in stance via RW                           ADL either performed or assessed with clinical judgement   ADL Overall ADL's : Needs assistance/impaired     Grooming: Supervision/safety;Wash/dry face;Oral care;Sitting Grooming Details (indicate cue type and reason): EOB             Lower Body Dressing: Maximal assistance;Sitting/lateral leans Lower Body Dressing Details (indicate cue type and reason): adjusting B socks                    Extremity/Trunk Assessment              Vision       Perception     Praxis     Communication Communication Communication: No apparent difficulties   Cognition Arousal: Alert Behavior During Therapy: Flat affect Cognition: Cognition impaired     Awareness: Online awareness impaired Memory impairment (select all impairments): Short-term memory Attention impairment (select first level of impairment): Selective attention Executive functioning impairment (select all impairments): Organization, Sequencing, Problem solving OT - Cognition Comments: very flat affect, limited volition  Following commands: Impaired Following commands impaired: Follows multi-step commands with increased time      Cueing   Cueing Techniques: Verbal cues, Tactile cues, Visual cues  Exercises      Shoulder Instructions       General Comments  wife asleep, present throughout    Pertinent Vitals/ Pain       Pain Assessment Pain Assessment: No/denies pain  Home Living                                          Prior Functioning/Environment              Frequency  Min 2X/week        Progress Toward Goals  OT Goals(current goals can now be found in the care plan section)        Plan      Co-evaluation                 AM-PAC OT 6 Clicks Daily Activity     Outcome Measure   Help from another person eating meals?: None Help from another person taking care of personal grooming?: A Little Help from another person toileting, which includes using toliet, bedpan, or urinal?: A Lot Help from another person bathing (including washing, rinsing, drying)?: A Lot Help from another person to put on and taking off regular upper body clothing?: None Help from another person to put on and taking off regular lower body clothing?: A Lot 6 Click Score: 17    End of Session Equipment Utilized During Treatment: Gait belt;Rolling walker (2 wheels)  OT Visit Diagnosis: Unsteadiness on feet (R26.81);Muscle weakness (generalized) (M62.81)   Activity Tolerance Patient tolerated treatment well   Patient Left in bed;with bed alarm set;with family/visitor present;with call bell/phone within reach   Nurse Communication Mobility status        Time: 8844-8787 OT Time Calculation (min): 17 min  Charges: OT General Charges $OT Visit: 1 Visit OT Treatments $Self Care/Home Management : 8-22 mins  James Downs D., MSOT, OTR/L Acute Rehabilitation Services [phone] Secure Chat Preferred  James Downs 12/01/2023, 3:01 PM

## 2023-12-01 NOTE — Plan of Care (Signed)
  Problem: Health Behavior/Discharge Planning: Goal: Ability to manage health-related needs will improve 12/01/2023 0511 by Leobardo Zada Ronnald GORMAN, RN Outcome: Progressing 12/01/2023 0511 by Leobardo Zada Ronnald GORMAN, RN Outcome: Progressing   Problem: Clinical Measurements: Goal: Will remain free from infection 12/01/2023 0511 by Leobardo Zada Ronnald GORMAN, RN Outcome: Progressing 12/01/2023 0511 by Leobardo Zada Ronnald GORMAN, RN Outcome: Progressing   Problem: Clinical Measurements: Goal: Diagnostic test results will improve 12/01/2023 0511 by Leobardo Zada Ronnald GORMAN, RN Outcome: Progressing 12/01/2023 0511 by Leobardo Zada Ronnald GORMAN, RN Outcome: Progressing   Problem: Activity: Goal: Risk for activity intolerance will decrease 12/01/2023 0511 by Leobardo Zada Ronnald GORMAN, RN Outcome: Progressing 12/01/2023 0511 by Leobardo Zada Ronnald GORMAN, RN Outcome: Progressing   Problem: Nutrition: Goal: Adequate nutrition will be maintained 12/01/2023 0511 by Leobardo Zada Ronnald GORMAN, RN Outcome: Progressing 12/01/2023 0511 by Leobardo Zada Ronnald GORMAN, RN Outcome: Progressing   Problem: Elimination: Goal: Will not experience complications related to bowel motility 12/01/2023 0511 by Leobardo Zada Ronnald GORMAN, RN Outcome: Progressing 12/01/2023 0511 by Leobardo Zada Ronnald GORMAN, RN Outcome: Progressing   Problem: Pain Managment: Goal: General experience of comfort will improve and/or be controlled 12/01/2023 0511 by Leobardo Zada Ronnald GORMAN, RN Outcome: Progressing 12/01/2023 0511 by Leobardo Zada Ronnald GORMAN, RN Outcome: Progressing   Problem: Skin Integrity: Goal: Risk for impaired skin integrity will decrease 12/01/2023 0511 by Leobardo Zada Ronnald GORMAN, RN Outcome: Progressing 12/01/2023 0511 by Leobardo Zada Ronnald GORMAN, RN Outcome: Progressing   Problem: Safety: Goal: Ability to remain free from injury will improve 12/01/2023 0511 by Leobardo Zada Ronnald GORMAN, RN Outcome: Progressing 12/01/2023 0511 by Leobardo Zada Ronnald GORMAN, RN Outcome: Progressing

## 2023-12-01 NOTE — Progress Notes (Signed)
 Daily Progress Note Intern Pager: [phone]  Patient name: James Downs Medical record number: [id number] Date of birth: 1965-04-30 Age: 58 y.o. Gender: male  Primary Care Provider: Patient, No Pcp Per Consultants: GI, palliative  Code Status: DNR limited  Pt Overview and Major Events to Date:  0/29: admitted   Medical Decision Making:  DELQUAN Downs is a 58 y.o. male with a PMH of NASH, varices, gastric ulcer, portal HTN, HLD, essential HTN, GAD, tobacco use, OSA, and nephrolithiasis who presented with AMS found to be 2/2 decompensated liver cirrhosis with hepatorenal syndrome, esophageal varices, and hepatic encephalopathy.  Assessment & Plan Decompensated cirrhosis (HCC) Hepatorenal syndrome (HCC) Esophageal varices in cirrhosis (HCC) Afebrile, hemodynamically stable.   - GI consulted, appreciate recommendations  - Continue Lactulose  to 30mg  TID - Continue Rifaximin 550 mg PO BID - Continue spironolactone  100 mg daily -Could still consider therapeutic paracentesis if needed, but recommend limiting due to increase in risk for SBP - Palliative care  - Reglan 10 mg TID before meals, for hiccups  - Added back Lasix  40 mg  - AM CMP, CBC - can consider therapeutic paracentesis tomorrow but not need today - PT/OT following - Continue delirium precautions - BCx pending but NG @ 5 days - CBC stable, continue Lovenox AKI (acute kidney injury) Creatinine 1.45-->1.54. Continue to monitor. MAPs in the low 80s-70s.  - nephrology following, appreciate recs - continue midodrine at 30 mg TID today given improving creatinine despite not at goal MAP - continue octreotide 200 mcg TID - strict I&Os, daily weights - AM CMP Black stool Stable, no changes. - AM CBC Reactive perforating collagenosis No new concerns regarding lesions today.  - treating cirrhosis as above Depression Continue current medication with expectation of full effect in 4 to 6 weeks, recommend  outpatient CBT. - Continue Escitalopram 5mg  daily     FEN/GI: Sodium restriction  PPx: Lovenox  Dispo: SNF pending clinical improvement.   Subjective:  Patient is resting comfortably in bed today. He states he is so-so. He denies any pain this morning. He states he slept okay last night. He requested some melatonin this morning and was told it was too early. I told him we give it at night time and recommended he ask for some tonight.   Objective: Temp:  [97.8 F (36.6 C)-98.7 F (37.1 C)] 98.6 F (37 C) (10/07 0315) Pulse Rate:  [65-73] 66 (10/07 0315) Resp:  [15-20] 15 (10/07 0315) BP: (105-126)/(54-96) 105/54 (10/07 0315) SpO2:  [92 %-98 %] 92 % (10/07 0315) Weight:  [83.2 kg] 83.2 kg (10/07 0315) Physical Exam: General: NAD Cardiovascular: RRR, no m/r/g Respiratory: CTAB, normal work of breathing on room air  Abdomen: soft, distended, tender to palpation in RUQ Extremities: SCDs in place   Laboratory: Most recent CBC Lab Results  Component Value Date   WBC 9.5 12/01/2023   HGB 10.7 (L) 12/01/2023   HCT 31.4 (L) 12/01/2023   MCV 96.9 12/01/2023   PLT 93 (L) 12/01/2023   Most recent BMP    Latest Ref Rng & Units 12/01/2023    5:06 AM  BMP  Glucose 70 - 99 mg/dL 881   BUN 6 - 20 mg/dL 22   Creatinine 9.38 - 1.24 mg/dL 8.45   Sodium 864 - 854 mmol/L 136   Potassium 3.5 - 5.1 mmol/L 3.5   Chloride 98 - 111 mmol/L 105   CO2 22 - 32 mmol/L 21   Calcium 8.9 - 10.3 mg/dL  8.7    Imaging/Diagnostic Tests: No new imaging.   James Raguel MATSU, DO 12/01/2023, 7:43 AM  PGY-1, [facility] FPTS Intern pager: [phone], text pages welcome Secure chat group [facility] Teaching Service

## 2023-12-01 NOTE — Progress Notes (Addendum)
 Daily Progress Note   Patient Name: James Downs       Date: 12/01/2023 DOB: 04-07-1965  Age: 58 y.o. MRN#: [id number] Attending Physician: Madelon Donald CHRISTELLA, DO Primary Care Physician: Patient, No Pcp Per Admit Date: 11/23/2023  Reason for Consultation/Follow-up: Non pain symptom management  Subjective: Medical records reviewed including progress notes, labs and imaging. Patient assessed at the bedside.  He denies any disturbing symptoms, reports that his hiccups are now gone.  His wife is present visiting.  Created space and opportunity for patient and family's thoughts and feelings on patient's current illness.  He is glad to report that since starting Reglan, his hiccups are now gone.  Denies any adverse effects.  His wife is curious about discharge timing, as her initial understanding was that he was supposed to go home yesterday given that he has turned the corner.  I clarified with the patient would want to go home versus SNF given recent recommendations.  Patient would love to go home for couple of hours prior to going to SNF and we discussed that this is likely not feasible due to insurance guidelines.  He verbalizes understanding.  He also confirms that he has agreed to outpatient palliative care follow-up for further goals of care discussions pending his progress after discharge.  Questions and concerns addressed. PMT will continue to support holistically.   Length of Stay: 8   Physical Exam Vitals and nursing note reviewed.  Constitutional:      General: He is not in acute distress.    Appearance: He is ill-appearing.  HENT:     Head: Normocephalic and atraumatic.  Cardiovascular:     Rate and Rhythm: Bradycardia present.  Pulmonary:     Effort: Pulmonary effort is normal.  Neurological:     Mental Status: Mental  status is at baseline.  Psychiatric:        Mood and Affect: Mood normal.        Behavior: Behavior normal.            Vital Signs: BP (!) 109/56 (BP Location: Left Arm)   Pulse (!) 51   Temp 99 F (37.2 C) (Oral)   Resp 16   Ht 5' 7 (1.702 m)   Wt 83.2 kg   SpO2 98%   BMI 28.74 kg/m  SpO2: SpO2: 98 % O2 Device: O2 Device: Room Air O2 Flow Rate:        Palliative Assessment/Data: 40%   Palliative Care Assessment & Plan   Patient Profile: 58 y.o. male  with past medical history of non-alcoholic cirrhosis, esophageal varices, gastric ulcer, portal  HTN, HLD, nephrolithiasis  admitted on 11/23/2023 with 2-3 days of weakness, abdominal pain, and diarrhea.    11/23/2023: Paracentesis done, 7.9 L  of ascitic fluid removed 11/25/2023: Paracentesis done, 8.3 L of ascitic fluid removed    PMT has been consulted to assist with goals of care conversation. Patient/Family face treatment option decisions, advanced directive decisions and anticipatory care needs.   Assessment: Goals of care conversation Decompensated NASH cirrhosis Ascites AKI versus HRS, improved Hepatic encephalopathy, resolved  Recommendations/Plan: Continue DNR/DNI Continue current care plan.  Goal is SNF with outpatient palliative care follow-up, as much improvement as possible Psychosocial and emotional support provided After visit, noted GI discontinued Reglan due to risk of altered mental status.  Monitor for worsening of hiccups PMT will continue to follow and support as needed   Prognosis:  Unable to determine  Discharge Planning: Skilled Nursing Facility for rehab with Palliative care service follow-up   Care plan was discussed with patient, patient's wife, primary team         James Downs James Fell, PA-C  Palliative Medicine Team Team phone # [phone]  Thank you for allowing the Palliative Medicine Team to assist in the care of this patient. Please utilize secure chat with additional  questions, if there is no response within 30 minutes please call the above phone number.  Palliative Medicine Team providers are available by phone from 7am to 7pm daily and can be reached through the team cell phone.  Should this patient require assistance outside of these hours, please call the patient's attending physician.    Time Total: 35  Visit consisted of counseling and education dealing with the complex and emotionally intense issues of symptom management and palliative care in the setting of serious and potentially life-threatening illness. Greater than 50% of this time was spent counseling and coordinating care related to the above assessment and plan.  Personally spent 35 minutes in patient care including extensive chart review (labs, imaging, progress/consult notes, vital signs), medically appropraite exam, discussed with treatment team, education to patient, family, and staff, documenting clinical information, medication review and management, coordination of care, and available advanced directive documents.

## 2023-12-01 NOTE — Assessment & Plan Note (Signed)
 Stable, no changes. - AM CBC

## 2023-12-01 NOTE — Assessment & Plan Note (Signed)
 Continue current medication with expectation of full effect in 4 to 6 weeks, recommend outpatient CBT. - Continue Escitalopram 5mg  daily

## 2023-12-01 NOTE — TOC Progression Note (Signed)
 Transition of Care [facility]) - Progression Note    Patient Details  Name: James Downs MRN: [id number] Date of Birth: Jan 10, 1966  Transition of Care [facility]) CM/SW Contact  Almarie CHRISTELLA [place], [place] Phone Number: 12/01/2023, 12:17 PM  Clinical Narrative:   CSW following for disposition. Patient with no bed offers at this time. CSW to follow.    Expected Discharge Plan: Skilled Nursing Facility Barriers to Discharge: English as a second language teacher, Continued Medical Work up, Inadequate or no insurance               Expected Discharge Plan and Services   Discharge Planning Services: CM Consult   Living arrangements for the past 2 months: Single Family Home                                       Social Drivers of Health (SDOH) Interventions SDOH Screenings   Food Insecurity: No Food Insecurity (11/24/2023)  Housing: Unknown (11/24/2023)  Transportation Needs: No Transportation Needs (11/24/2023)  Utilities: Not At Risk (11/24/2023)  Tobacco Use: High Risk (08/11/2023)    Readmission Risk Interventions     No data to display

## 2023-12-01 NOTE — Progress Notes (Addendum)
 Subjective: No complaints.  He thinks his abdomen is getting tighter.  Objective: Vital signs in last 24 hours: Temp:  [97.8 F (36.6 C)-99 F (37.2 C)] 98.2 F (36.8 C) (10/07 1233) Pulse Rate:  [51-72] 68 (10/07 1233) Resp:  [15-18] 16 (10/07 1233) BP: (105-122)/(54-79) 115/68 (10/07 1233) SpO2:  [92 %-98 %] 98 % (10/07 1233) Weight:  [83.2 kg] 83.2 kg (10/07 0315) Last BM Date : 11/30/23  Intake/Output from previous day: 10/06 0701 - 10/07 0700 In: 120 [P.O.:120] Out: 2000 [Urine:1100; Stool:900] Intake/Output this shift: No intake/output data recorded.  General appearance: alert and no distress GI: soft, more firm from the ascites  Lab Results: Recent Labs    11/29/23 0452 11/30/23 0453 12/01/23 0506  WBC 9.2 9.1 9.5  HGB 11.7* 10.7* 10.7*  HCT 34.5* 31.3* 31.4*  PLT 106* 94* 93*   BMET Recent Labs    11/29/23 0452 11/30/23 0453 12/01/23 0506  NA 137 135 136  K 3.7 3.6 3.5  CL 106 106 105  CO2 21* 19* 21*  GLUCOSE 135* 141* 118*  BUN 27* 24* 22*  CREATININE 1.61* 1.45* 1.54*  CALCIUM 8.8* 8.4* 8.7*   LFT Recent Labs    12/01/23 0506  PROT 5.4*  ALBUMIN  3.2*  AST 27  ALT 15  ALKPHOS 26*  BILITOT 3.1*   PT/INR Recent Labs    11/29/23 0452 11/30/23 0453  LABPROT 24.7* 25.2*  INR 2.1* 2.2*   Hepatitis Panel No results for input(s): HEPBSAG, HCVAB, HEPAIGM, HEPBIGM in the last 72 hours. C-Diff No results for input(s): CDIFFTOX in the last 72 hours. Fecal Lactopherrin No results for input(s): FECLLACTOFRN in the last 72 hours.  Studies/Results: No results found.  Medications: Scheduled:  enoxaparin (LOVENOX) injection  40 mg Subcutaneous Q24H   escitalopram  5 mg Oral Daily   feeding supplement  237 mL Oral BID BM   furosemide   40 mg Oral Daily   lactulose   30 g Oral TID   metoCLOPramide  10 mg Oral TID AC   midodrine  30 mg Oral TID WC   octreotide  200 mcg Subcutaneous TID   pantoprazole  40 mg Oral BID AC    rifaximin  550 mg Oral BID   spironolactone   100 mg Oral Daily   Continuous:  Assessment/Plan: 1) Decompensated MASH cirrhosis. 2) Ascites - worsened. 3) AKI versus HRS - improved and now at baseline. 4) HE - resolved.   The patient's weight increased by 4.4 lbs.  This was also evident with the abdominal examination.  He is urinating well, per his report.  With the increase in the weight he will benefit with an increase in his spironolactone  to 200 mg every day.  The dosage needs to be reassessed every 3-4 days.  As for furosemide , I will defer any use to renal with regards to his creatinine and the need for midodrine.  Plan: 1) Increase spironolactone  to 200 mg every day. 2) He may need a repeat paracentesis, if he does not adequately respond. 3) Discontinue metoclopramide as he suffers HE.  Metoclopramide can cause AMS. 4) Discontinue fluid restriction as he is no longer hyponatremic.  LOS: 8 days   Tayjon Halladay D 12/01/2023, 1:35 PM

## 2023-12-01 NOTE — Assessment & Plan Note (Addendum)
 Afebrile, hemodynamically stable.   - GI consulted, appreciate recommendations  - Continue Lactulose  to 30mg  TID - Continue Rifaximin 550 mg PO BID - Continue spironolactone  100 mg daily -Could still consider therapeutic paracentesis if needed, but recommend limiting due to increase in risk for SBP - Palliative care  - Reglan 10 mg TID before meals, for hiccups  - Added back Lasix  40 mg  - AM CMP, CBC - can consider therapeutic paracentesis tomorrow but not need today - PT/OT following - Continue delirium precautions - BCx pending but NG @ 5 days - CBC stable, continue Lovenox

## 2023-12-01 NOTE — Assessment & Plan Note (Signed)
 No new concerns regarding lesions today.  - treating cirrhosis as above

## 2023-12-01 NOTE — Assessment & Plan Note (Addendum)
 Creatinine 1.45-->1.54. Continue to monitor. MAPs in the low 80s-70s.  - nephrology following, appreciate recs - continue midodrine at 30 mg TID today given improving creatinine despite not at goal MAP - continue octreotide 200 mcg TID - strict I&Os, daily weights - AM CMP

## 2023-12-02 DIAGNOSIS — K7460 Unspecified cirrhosis of liver: Secondary | ICD-10-CM | POA: Diagnosis not present

## 2023-12-02 DIAGNOSIS — K7290 Hepatic failure, unspecified without coma: Secondary | ICD-10-CM | POA: Diagnosis not present

## 2023-12-02 LAB — CBC
HCT: 33.3 % — ABNORMAL LOW (ref 39.0–52.0)
Hemoglobin: 11.2 g/dL — ABNORMAL LOW (ref 13.0–17.0)
MCH: 32.7 pg (ref 26.0–34.0)
MCHC: 33.6 g/dL (ref 30.0–36.0)
MCV: 97.4 fL (ref 80.0–100.0)
Platelets: 99 K/uL — ABNORMAL LOW (ref 150–400)
RBC: 3.42 MIL/uL — ABNORMAL LOW (ref 4.22–5.81)
RDW: 17.1 % — ABNORMAL HIGH (ref 11.5–15.5)
WBC: 11.7 K/uL — ABNORMAL HIGH (ref 4.0–10.5)
nRBC: 0 % (ref 0.0–0.2)

## 2023-12-02 LAB — COMPREHENSIVE METABOLIC PANEL WITH GFR
ALT: 15 U/L (ref 0–44)
AST: 30 U/L (ref 15–41)
Albumin: 3.1 g/dL — ABNORMAL LOW (ref 3.5–5.0)
Alkaline Phosphatase: 30 U/L — ABNORMAL LOW (ref 38–126)
Anion gap: 11 (ref 5–15)
BUN: 22 mg/dL — ABNORMAL HIGH (ref 6–20)
CO2: 20 mmol/L — ABNORMAL LOW (ref 22–32)
Calcium: 8.6 mg/dL — ABNORMAL LOW (ref 8.9–10.3)
Chloride: 104 mmol/L (ref 98–111)
Creatinine, Ser: 1.72 mg/dL — ABNORMAL HIGH (ref 0.61–1.24)
GFR, Estimated: 45 mL/min — ABNORMAL LOW (ref >60)
Glucose, Bld: 121 mg/dL — ABNORMAL HIGH (ref 70–99)
Potassium: 3.6 mmol/L (ref 3.5–5.1)
Sodium: 135 mmol/L (ref 135–145)
Total Bilirubin: 2.9 mg/dL — ABNORMAL HIGH (ref 0.0–1.2)
Total Protein: 5.5 g/dL — ABNORMAL LOW (ref 6.5–8.1)

## 2023-12-02 MED ORDER — POTASSIUM CHLORIDE CRYS ER 20 MEQ PO TBCR
40.0000 meq | EXTENDED_RELEASE_TABLET | Freq: Once | ORAL | Status: AC
  Administered 2023-12-02: 40 meq via ORAL
  Filled 2023-12-02: qty 2

## 2023-12-02 MED ORDER — TAMSULOSIN HCL 0.4 MG PO CAPS
0.4000 mg | ORAL_CAPSULE | Freq: Every day | ORAL | Status: DC
Start: 2023-12-02 — End: 2023-12-02

## 2023-12-02 NOTE — Assessment & Plan Note (Signed)
 No new concerns regarding lesions today.  - treating cirrhosis as above

## 2023-12-02 NOTE — Progress Notes (Addendum)
 This chaplain is present for F/U on notarizing the Pt. Advance Directive. The Pt. wife-Wanda is at the bedside as the Pt. shares he prefers his daughter-in-law's presence when the notary visits.  The chaplain understands the Pt. is coordinating a visit with [name].    **[id number] This chaplain revisited with Chaplain Nolan after unit page for notary services to complete Pt. AD. The Pt. is asleep at the time of the visit and [name] is no longer at the bedside. This chaplain is grateful for the unit's communication   The chaplain is available for F/U spiritual care as needed.  Chaplain Leeroy Hummer [phone]

## 2023-12-02 NOTE — Assessment & Plan Note (Addendum)
 Afebrile, hemodynamically stable. Awake, alert, and responding appropriately.  - GI consulted, appreciate recommendations  - Continue Lactulose  to 30mg  TID - Continue Rifaximin 550 mg PO BID - Increased spironolactone  200 mg daily -Could still consider therapeutic paracentesis if needed, but recommend limiting due to increase in risk for SBP - Palliative care  - Reglan 10 mg TID before meals, for hiccups  - DC'd Lasix  40 mg  - AM CMP, CBC - can consider therapeutic paracentesis tomorrow but not need today - PT/OT following - Continue delirium precautions - BCx pending but NG @ 5 days - AM BMP

## 2023-12-02 NOTE — Plan of Care (Signed)
  Problem: Health Behavior/Discharge Planning: Goal: Ability to manage health-related needs will improve Outcome: Progressing   Problem: Clinical Measurements: Goal: Will remain free from infection Outcome: Progressing   Problem: Activity: Goal: Risk for activity intolerance will decrease Outcome: Progressing   Problem: Nutrition: Goal: Adequate nutrition will be maintained Outcome: Progressing   Problem: Coping: Goal: Level of anxiety will decrease Outcome: Progressing   Problem: Elimination: Goal: Will not experience complications related to bowel motility Outcome: Progressing   Problem: Safety: Goal: Ability to remain free from injury will improve Outcome: Progressing   Problem: Skin Integrity: Goal: Risk for impaired skin integrity will decrease Outcome: Progressing

## 2023-12-02 NOTE — Assessment & Plan Note (Addendum)
 Creatinine 1.54-->1.72. Continue to monitor. MAPs in the mid 80s-70s.  - nephrology signed off  - continue midodrine at 30 mg TID today given improving creatinine despite not at goal MAP - continue octreotide 200 mcg TID - strict I&Os, daily weights - AM BMP

## 2023-12-02 NOTE — Progress Notes (Signed)
 Subjective: Since I last evaluated the patient his ascites seems to have worsened but he is not short of breath and denies having any abdominal abdominal pain.  He had a bowel movement this morning without any blood or mucus in the stool.  He seems more alert and awake and is answering questions appropriately. Objective: Vital signs in last 24 hours: Temp:  [98 F (36.7 C)-98.9 F (37.2 C)] 98 F (36.7 C) (10/08 1138) Pulse Rate:  [54-72] 66 (10/08 1138) Resp:  [16-19] 19 (10/08 1138) BP: (94-111)/(61-73) 99/61 (10/08 1138) SpO2:  [96 %-100 %] 97 % (10/08 1138) Last BM Date : 12/01/23  Intake/Output from previous day: 10/07 0701 - 10/08 0700 In: 180 [P.O.:180] Out: 825 [Urine:775; Stool:50] Intake/Output this shift: No intake/output data recorded.  General appearance: cooperative, appears stated age, fatigued, and icteric Resp: clear to auscultation bilaterally Cardio: regular rate and rhythm, S1, S2 normal, no murmur, click, rub or gallop GI: soft, distended with fluid thrill,non-tender; bowel sounds normal; no masses,  no organomegaly  Lab Results: Recent Labs    11/30/23 0453 12/01/23 0506 12/02/23 0128  WBC 9.1 9.5 11.7*  HGB 10.7* 10.7* 11.2*  HCT 31.3* 31.4* 33.3*  PLT 94* 93* 99*   BMET Recent Labs    11/30/23 0453 12/01/23 0506 12/02/23 0128  NA 135 136 135  K 3.6 3.5 3.6  CL 106 105 104  CO2 19* 21* 20*  GLUCOSE 141* 118* 121*  BUN 24* 22* 22*  CREATININE 1.45* 1.54* 1.72*  CALCIUM 8.4* 8.7* 8.6*   LFT Recent Labs    12/02/23 0128  PROT 5.5*  ALBUMIN  3.1*  AST 30  ALT 15  ALKPHOS 30*  BILITOT 2.9*   PT/INR Recent Labs    11/30/23 0453  LABPROT 25.2*  INR 2.2*   Studies/Results: No results found.  Medications: I have reviewed the patient's current medications. Prior to Admission:  Medications Prior to Admission  Medication Sig Dispense Refill Last Dose/Taking   aspirin EC 81 MG tablet Take 81 mg by mouth daily. Swallow whole.    11/22/2023 Morning   carvedilol  (COREG ) 6.25 MG tablet Take 6.25 mg by mouth 2 (two) times daily. (Patient not taking: Reported on 11/23/2023)   Not Taking   furosemide  (LASIX ) 40 MG tablet Take 1 tablet (40 mg total) by mouth daily. (Patient not taking: Reported on 11/23/2023) 30 tablet 0 Not Taking   lactulose  (CHRONULAC ) 10 GM/15ML solution Take 30 mLs (20 g total) by mouth 2 (two) times daily. (Patient not taking: Reported on 11/23/2023) 1800 mL 0 Not Taking   spironolactone  (ALDACTONE ) 100 MG tablet Take 1 tablet (100 mg total) by mouth daily. (Patient not taking: Reported on 11/23/2023) 30 tablet 0 Not Taking   Scheduled:  enoxaparin (LOVENOX) injection  40 mg Subcutaneous Q24H   escitalopram  5 mg Oral Daily   feeding supplement  237 mL Oral BID BM   lactulose   30 g Oral TID   melatonin  3 mg Oral QHS   midodrine  30 mg Oral TID WC   octreotide  200 mcg Subcutaneous TID   pantoprazole  40 mg Oral BID AC   rifaximin  550 mg Oral BID   spironolactone   200 mg Oral Daily   Continuous:  Assessment/Plan: 1) Decompensated MASH cirrhosis with hepatic encephalopathy ascites and esophageal varices-seems to be improving on Lactulose  and Xifaxan along with Midodrine and Spironolactone . 2) Acute kidney injury superimposed on chronic kidney disease-risk for hepatorenal syndrome. 3) Rectal bleeding with  portal colopathy noted on CT scan-resolved  4) Extensive nephrolithiasis without any evidence of obstructive uropathy 5) Skin rash over lower legs. 6) Contracted gallbladder with cholelithiasis 7) Limited code; patient does not desire intubation  LOS: 9 days   Renaye Sous 12/02/2023, 2:39 PM

## 2023-12-02 NOTE — Assessment & Plan Note (Signed)
 Continue current medication with expectation of full effect in 4 to 6 weeks, recommend outpatient CBT. - Continue Escitalopram 5mg  daily

## 2023-12-02 NOTE — Progress Notes (Signed)
 Physical Therapy Treatment Patient Details Name: James Downs MRN: [id number] DOB: 1965/10/06 Today's Date: 12/02/2023   History of Present Illness 58 year old male presenting 9/29 with weakness, abdominal pain, diarrhea, and several days of altered mental status. Dx Hepatic encephalopathy, Decompensated NASH cirrhosis, Ascites, AKI with CKD. PMHof non-alcoholic cirrhosis, HTN, HLD, and nephrolithiasis,    PT Comments  Pt not making progress towards his physical therapy goals, remains limited by significant debility/deconditioning. Encouraged increased po intake. Pt able to participate in bed level exercises and requiring min-mod assist for bed mobility and transfers. Unable to hold static stand or progress ambulation due to weakness. Pt also complaining of left lateral knee pain. Patient will benefit from continued inpatient follow up therapy, <3 hours/day.    If plan is discharge home, recommend the following: A lot of help with walking and/or transfers;A lot of help with bathing/dressing/bathroom;Assistance with cooking/housework;Assist for transportation;Help with stairs or ramp for entrance   Can travel by private vehicle     No  Equipment Recommendations  Wheelchair (measurements PT);BSC/3in1    Recommendations for Other Services       Precautions / Restrictions Precautions Precautions: Fall Recall of Precautions/Restrictions: Impaired Precaution/Restrictions Comments: rectal tube Restrictions Weight Bearing Restrictions Per Provider Order: No     Mobility  Bed Mobility Overal bed mobility: Needs Assistance Bed Mobility: Supine to Sit, Sit to Supine     Supine to sit: Min assist Sit to supine: Min assist   General bed mobility comments: Cues for use of bed rail, assist at trunk for elevation, minA to execute fully bringing LE's back up into bed    Transfers Overall transfer level: Needs assistance Equipment used: Rolling walker (2 wheels) Transfers: Sit  to/from Stand Sit to Stand: Mod assist           General transfer comment: Rocking to gain momentum, modA to power up to standing    Ambulation/Gait               General Gait Details: unable   Stairs             Wheelchair Mobility     Tilt Bed    Modified Rankin (Stroke Patients Only)       Balance Overall balance assessment: Needs assistance Sitting-balance support: No upper extremity supported, Feet supported Sitting balance-Leahy Scale: Good     Standing balance support: Bilateral upper extremity supported, During functional activity, Reliant on assistive device for balance Standing balance-Leahy Scale: Poor                              Communication Communication Communication: No apparent difficulties  Cognition Arousal: Alert Behavior During Therapy: WFL for tasks assessed/performed   PT - Cognitive impairments: Sequencing, Problem solving                         Following commands: Impaired Following commands impaired: Follows multi-step commands with increased time    Cueing Cueing Techniques: Verbal cues, Tactile cues, Visual cues  Exercises General Exercises - Lower Extremity Heel Slides: AROM, Both, 5 reps, Supine Hip ABduction/ADduction: Both, 5 reps, Supine, AAROM Straight Leg Raises: AAROM, Both, 5 reps, Supine    General Comments        Pertinent Vitals/Pain Pain Assessment Pain Assessment: Faces Faces Pain Scale: Hurts little more Pain Location: L lateral knee Pain Descriptors / Indicators: Sharp Pain Intervention(s): Monitored during session  Home Living                          Prior Function            PT Goals (current goals can now be found in the care plan section) Acute Rehab PT Goals Patient Stated Goal: agreeable to short term rehab Potential to Achieve Goals: Fair Progress towards PT goals: Not progressing toward goals - comment    Frequency    Min  2X/week      PT Plan      Co-evaluation              AM-PAC PT 6 Clicks Mobility   Outcome Measure  Help needed turning from your back to your side while in a flat bed without using bedrails?: A Little Help needed moving from lying on your back to sitting on the side of a flat bed without using bedrails?: A Little Help needed moving to and from a bed to a chair (including a wheelchair)?: A Lot Help needed standing up from a chair using your arms (e.g., wheelchair or bedside chair)?: A Lot Help needed to walk in hospital room?: Total Help needed climbing 3-5 steps with a railing? : Total 6 Click Score: 12    End of Session Equipment Utilized During Treatment: Gait belt Activity Tolerance: Patient limited by fatigue Patient left: in bed;with call bell/phone within reach;with bed alarm set Nurse Communication: Mobility status PT Visit Diagnosis: Unsteadiness on feet (R26.81);Other abnormalities of gait and mobility (R26.89);Muscle weakness (generalized) (M62.81);Difficulty in walking, not elsewhere classified (R26.2);Other symptoms and signs involving the nervous system (R29.898)     Time: 8595-8576 PT Time Calculation (min) (ACUTE ONLY): 19 min  Charges:    $Therapeutic Activity: 8-22 mins PT General Charges $$ ACUTE PT VISIT: 1 Visit                     Aleck Daring, PT, DPT Acute Rehabilitation Services Office [phone]    Aleck ONEIDA Daring 12/02/2023, 3:39 PM

## 2023-12-02 NOTE — Assessment & Plan Note (Addendum)
 Stable, no changes

## 2023-12-02 NOTE — Plan of Care (Signed)

## 2023-12-02 NOTE — Progress Notes (Signed)
 Daily Progress Note Intern Pager: [phone]  Patient name: James Downs Medical record number: [id number] Date of birth: 1965-10-03 Age: 58 y.o. Gender: male  Primary Care Provider: Patient, No Pcp Per Consultants: GI, palliative  Code Status: DNR limited   Pt Overview and Major Events to Date:  9/29: Admitted   Medical Decision Making:  James Downs is a 58 y.o. male with a PMH of NASH, varices, gastric ulcer, portal HTN, HLD, essential HTN, GAD, tobacoo use, OSA, and nephrolithiasis who presented with AMS found to be 2/2 decompensated liver cirrhosis with hepatorenal syndrome, esophageal varices, and hepatic encephalopathy.  Assessment & Plan Decompensated cirrhosis (HCC) Hepatorenal syndrome (HCC) Esophageal varices in cirrhosis (HCC) Afebrile, hemodynamically stable. Awake, alert, and responding appropriately.  - GI consulted, appreciate recommendations  - Continue Lactulose  to 30mg  TID - Continue Rifaximin 550 mg PO BID - Increased spironolactone  200 mg daily -Could still consider therapeutic paracentesis if needed, but recommend limiting due to increase in risk for SBP - Palliative care  - Reglan 10 mg TID before meals, for hiccups  - DC'd Lasix  40 mg  - AM CMP, CBC - can consider therapeutic paracentesis tomorrow but not need today - PT/OT following - Continue delirium precautions - BCx pending but NG @ 5 days - AM BMP AKI (acute kidney injury) Creatinine 1.54-->1.72. Continue to monitor. MAPs in the mid 80s-70s.  - nephrology signed off  - continue midodrine at 30 mg TID today given improving creatinine despite not at goal MAP - continue octreotide 200 mcg TID - strict I&Os, daily weights - AM BMP Black stool Stable, no changes. Reactive perforating collagenosis No new concerns regarding lesions today.  - treating cirrhosis as above Depression Continue current medication with expectation of full effect in 4 to 6 weeks, recommend outpatient  CBT. - Continue Escitalopram 5mg  daily  FEN/GI: Sodium restriction  PPx: Lovenox  Dispo: SNF pending clinical improvement.   Subjective:  Patient states he is doing alright this morning. Denies any pain. He continues to urinate and have bowel movements. He has no other concerns at this time.   Objective: Temp:  [98.1 F (36.7 C)-99 F (37.2 C)] 98.5 F (36.9 C) (10/08 0311) Pulse Rate:  [51-72] 54 (10/08 0311) Resp:  [16-18] 18 (10/08 0311) BP: (104-115)/(56-73) 104/63 (10/08 0311) SpO2:  [96 %-100 %] 100 % (10/08 0311) Physical Exam: General: NAD Cardiovascular: RRR, no m/r/g Respiratory: CTAB, normal work of breathing on room air  Abdomen: distended, non-tender to palpation Extremities: SCDs in place, no edema in the feet bilaterally   Laboratory: Most recent CBC Lab Results  Component Value Date   WBC 11.7 (H) 12/02/2023   HGB 11.2 (L) 12/02/2023   HCT 33.3 (L) 12/02/2023   MCV 97.4 12/02/2023   PLT 99 (L) 12/02/2023   Most recent BMP    Latest Ref Rng & Units 12/02/2023    1:28 AM  BMP  Glucose 70 - 99 mg/dL 878   BUN 6 - 20 mg/dL 22   Creatinine 9.38 - 1.24 mg/dL 8.27   Sodium 864 - 854 mmol/L 135   Potassium 3.5 - 5.1 mmol/L 3.6   Chloride 98 - 111 mmol/L 104   CO2 22 - 32 mmol/L 20   Calcium 8.9 - 10.3 mg/dL 8.6    Imaging/Diagnostic Tests: No new imaging.   James Raguel MATSU, DO 12/02/2023, 7:48 AM  PGY-1, [facility] FPTS Intern pager: [phone], text pages welcome Secure chat group [facility]  [facility] Teaching Service

## 2023-12-03 ENCOUNTER — Inpatient Hospital Stay (HOSPITAL_COMMUNITY)

## 2023-12-03 DIAGNOSIS — R188 Other ascites: Secondary | ICD-10-CM | POA: Diagnosis not present

## 2023-12-03 DIAGNOSIS — K7460 Unspecified cirrhosis of liver: Secondary | ICD-10-CM | POA: Diagnosis not present

## 2023-12-03 DIAGNOSIS — K767 Hepatorenal syndrome: Secondary | ICD-10-CM | POA: Diagnosis not present

## 2023-12-03 LAB — BASIC METABOLIC PANEL WITH GFR
Anion gap: 12 (ref 5–15)
BUN: 23 mg/dL — ABNORMAL HIGH (ref 6–20)
CO2: 19 mmol/L — ABNORMAL LOW (ref 22–32)
Calcium: 8.4 mg/dL — ABNORMAL LOW (ref 8.9–10.3)
Chloride: 103 mmol/L (ref 98–111)
Creatinine, Ser: 1.81 mg/dL — ABNORMAL HIGH (ref 0.61–1.24)
GFR, Estimated: 43 mL/min — ABNORMAL LOW (ref >60)
Glucose, Bld: 118 mg/dL — ABNORMAL HIGH (ref 70–99)
Potassium: 4.2 mmol/L (ref 3.5–5.1)
Sodium: 134 mmol/L — ABNORMAL LOW (ref 135–145)

## 2023-12-03 MED ORDER — ALBUMIN HUMAN 25 % IV SOLN
0.0000 g | Freq: Once | INTRAVENOUS | Status: DC
  Filled 2023-12-03: qty 400

## 2023-12-03 NOTE — Assessment & Plan Note (Addendum)
 Afebrile, hemodynamically stable. Awake, alert, and responding appropriately. WBC 11.7 10/8.  - GI consulted, appreciate recommendations  - Continue Lactulose  to 30mg  TID - Continue Rifaximin 550 mg PO BID - Increased spironolactone  200 mg daily - Could still consider therapeutic paracentesis if needed, but recommend limiting due to increase in risk for SBP - Palliative care  - Reglan 10 mg TID before meals, for hiccups - Potential urinary retention, will do bedside ultrasound today to evaluate further - can consider therapeutic paracentesis tomorrow but not need today - PT/OT following - Continue delirium precautions - BCx pending but NG @ 5 days - AM CBC/BMP

## 2023-12-03 NOTE — Plan of Care (Signed)
 Assessed patient at bedside with Dr. Janna and Dr. Delores for decreased urine output.  Dr. Janna performed an ultrasound of the patient's bladder and found 214 mL of urine, therefore, there is no need for a catheter at this time.    We discussed the option of liver transplant with the patient and family, they are interested in pursuing this.  We called [facility] transfer line about potential transfer and evaluation for liver transplant.  We will fax a facesheet to them as well as attempt to transfer images through powershared.  We reached out to Dr. Rollin about potential transfer and he agreed.  Dr. Rollin recommended paracentesis along with albumin  administration.  Dr. Rollin will also be discontinuing the patient's spironolactone  as his creatinine is out of range for effectiveness.  We appreciate everyone's care of James Downs.

## 2023-12-03 NOTE — Assessment & Plan Note (Signed)
 Continue current medication with expectation of full effect in 4 to 6 weeks, recommend outpatient CBT. - Continue Escitalopram 5mg  daily

## 2023-12-03 NOTE — Assessment & Plan Note (Signed)
 Creatinine 1.72--> 1.81. Continue to monitor. MAPs in the mid 70s.  - nephrology signed off  - continue midodrine at 30 mg TID today given improving creatinine despite not at goal MAP - continue octreotide 200 mcg TID - strict I&Os, daily weights - AM BMP

## 2023-12-03 NOTE — Plan of Care (Signed)
 FMTS Interim Progress Note  Discussed with Dr. Landry Quale from [facility] regarding possibility of patient being transferred to [facility] for liver transplant consideration/evaluation.  After long discussion about the patient's hospitalization and history she deemed that he would not qualify at his current physical condition to be an optimal candidate for liver transplant, especially with his possible history of medication noncompliance and medical follow-up and the fragility of his current status.  She reports that he may qualify for referral as an outpatient transplant patient if he is able to perform a 6-minute walk well, be able to do acute inpatient rehab instead of SNF, and is compliant with his medications deeming him a reliable and stable patient for liver transplant.   Otherwise shared that he may not do well with diuretics and does not see the benefit of midodrine at doses higher than 15 mg TID.  She suggested that he may require IR paracenteses weekly to manage his cirrhosis.  She recommended nephro be reconsulted at this time due to possibly worsening CKD in the setting of his oliguria and see if HD is a consideration at this time.  Believes he may eventually end up being a liver and kidney transplant patient with the way this is progressing.    Appreciate Dr. Marcos time in consideration of this patient's case along with the information needed to deem him as an appropriate liver transplant candidate.  Kathrine Melena, DO 12/03/23 8:33 PM

## 2023-12-03 NOTE — Assessment & Plan Note (Signed)
 Stable, no changes

## 2023-12-03 NOTE — Plan of Care (Signed)
  Problem: Health Behavior/Discharge Planning: Goal: Ability to manage health-related needs will improve Outcome: Progressing   Problem: Clinical Measurements: Goal: Will remain free from infection Outcome: Progressing   Problem: Activity: Goal: Risk for activity intolerance will decrease Outcome: Progressing   Problem: Nutrition: Goal: Adequate nutrition will be maintained Outcome: Progressing   Problem: Coping: Goal: Level of anxiety will decrease Outcome: Progressing   Problem: Safety: Goal: Ability to remain free from injury will improve Outcome: Progressing   Problem: Skin Integrity: Goal: Risk for impaired skin integrity will decrease Outcome: Progressing   Problem: Elimination: Goal: Will not experience complications related to bowel motility Outcome: Progressing

## 2023-12-03 NOTE — Assessment & Plan Note (Signed)
 No new concerns regarding lesions today.  - treating cirrhosis as above

## 2023-12-03 NOTE — Progress Notes (Signed)
 Daily Progress Note Intern Pager: [phone]  Patient name: FREDRICO BEEDLE Medical record number: [id number] Date of birth: 10/09/1965 Age: 58 y.o. Gender: male  Primary Care Provider: Patient, No Pcp Per Consultants: GI, palliative Code Status: DNR limited  Pt Overview and Major Events to Date:  9/29: Admitted  Medical Decision Making:  BENTLEY FISSEL is a 58 year old male with a PMH of NASH, varices, gastric ulcer, portal hypertension, HLD, essential hypertension, GAD, tobacco use, OSA, and nephrolithiasis who presented with AMS found to be 2/2 decompensated liver cirrhosis with hepatorenal syndrome.  Assessment & Plan Decompensated cirrhosis (HCC) Hepatorenal syndrome (HCC) Esophageal varices in cirrhosis (HCC) Afebrile, hemodynamically stable. Awake, alert, and responding appropriately. WBC 11.7 10/8.  - GI consulted, appreciate recommendations  - Continue Lactulose  to 30mg  TID - Continue Rifaximin 550 mg PO BID - Increased spironolactone  200 mg daily - Could still consider therapeutic paracentesis if needed, but recommend limiting due to increase in risk for SBP - Palliative care  - Reglan 10 mg TID before meals, for hiccups - Potential urinary retention, will do bedside ultrasound today to evaluate further - can consider therapeutic paracentesis tomorrow but not need today - PT/OT following - Continue delirium precautions - BCx pending but NG @ 5 days - AM CBC/BMP AKI (acute kidney injury) Creatinine 1.72--> 1.81. Continue to monitor. MAPs in the mid 70s.  - nephrology signed off  - continue midodrine at 30 mg TID today given improving creatinine despite not at goal MAP - continue octreotide 200 mcg TID - strict I&Os, daily weights - AM BMP Black stool Stable, no changes. Reactive perforating collagenosis No new concerns regarding lesions today.  - treating cirrhosis as above Depression Continue current medication with expectation of full effect in 4  to 6 weeks, recommend outpatient CBT. - Continue Escitalopram 5mg  daily   FEN/GI: Sodium restriction PPx: Lovenox Dispo: SNF pending clinical improvement.  Subjective:  Patient seen resting comfortably this morning.  He states he is okay.  States his abdomen feels well.  Denies any pain this morning.  He has no other concerns or complaints at this time.  Objective: Temp:  [97.9 F (36.6 C)-99 F (37.2 C)] 98.2 F (36.8 C) (10/09 0400) Pulse Rate:  [57-72] 59 (10/09 0400) Resp:  [19] 19 (10/09 0400) BP: (94-152)/(61-110) 102/63 (10/09 0400) SpO2:  [96 %-100 %] 100 % (10/09 0400) Weight:  [83.1 kg] 83.1 kg (10/09 0500) Physical Exam: General: NAD Cardiovascular: RRR, no M/R/G Respiratory: CTAB, normal work of breathing on room air Abdomen: Soft, mildly distended, nontender to palpation Extremities: SCDs in place  Laboratory: Most recent CBC Lab Results  Component Value Date   WBC 11.7 (H) 12/02/2023   HGB 11.2 (L) 12/02/2023   HCT 33.3 (L) 12/02/2023   MCV 97.4 12/02/2023   PLT 99 (L) 12/02/2023   Most recent BMP    Latest Ref Rng & Units 12/03/2023    1:22 AM  BMP  Glucose 70 - 99 mg/dL 881   BUN 6 - 20 mg/dL 23   Creatinine 9.38 - 1.24 mg/dL 8.18   Sodium 864 - 854 mmol/L 134   Potassium 3.5 - 5.1 mmol/L 4.2   Chloride 98 - 111 mmol/L 103   CO2 22 - 32 mmol/L 19   Calcium 8.9 - 10.3 mg/dL 8.4    Imaging/Diagnostic Tests: No new imaging.  Lennie Raguel MATSU, DO 12/03/2023, 8:09 AM  PGY-1, [facility] FPTS Intern pager: [phone], text pages welcome Secure  chat group [facility] Teaching Service

## 2023-12-03 NOTE — Progress Notes (Signed)
 Subjective: No complaints.  Abdomen is fuller.  Objective: Vital signs in last 24 hours: Temp:  [97.9 F (36.6 C)-99 F (37.2 C)] 97.9 F (36.6 C) (10/09 1521) Pulse Rate:  [59-72] 59 (10/09 1521) Resp:  [18-19] 18 (10/09 1521) BP: (100-152)/(63-110) 107/66 (10/09 1521) SpO2:  [95 %-100 %] 97 % (10/09 1521) Weight:  [83.1 kg] 83.1 kg (10/09 0500) Last BM Date : 12/02/23 (Flexiseal)  Intake/Output from previous day: 10/08 0701 - 10/09 0700 In: 120 [P.O.:120] Out: 1334 [Urine:1234; Stool:100] Intake/Output this shift: No intake/output data recorded.  General appearance: alert and fatigued Resp: clear to auscultation bilaterally Cardio: regular rate and rhythm GI: distended, + ascites, soft  Lab Results: Recent Labs    12/01/23 0506 12/02/23 0128  WBC 9.5 11.7*  HGB 10.7* 11.2*  HCT 31.4* 33.3*  PLT 93* 99*   BMET Recent Labs    12/01/23 0506 12/02/23 0128 12/03/23 0122  NA 136 135 134*  K 3.5 3.6 4.2  CL 105 104 103  CO2 21* 20* 19*  GLUCOSE 118* 121* 118*  BUN 22* 22* 23*  CREATININE 1.54* 1.72* 1.81*  CALCIUM 8.7* 8.6* 8.4*   LFT Recent Labs    12/02/23 0128  PROT 5.5*  ALBUMIN  3.1*  AST 30  ALT 15  ALKPHOS 30*  BILITOT 2.9*   PT/INR No results for input(s): LABPROT, INR in the last 72 hours. Hepatitis Panel No results for input(s): HEPBSAG, HCVAB, HEPAIGM, HEPBIGM in the last 72 hours. C-Diff No results for input(s): CDIFFTOX in the last 72 hours. Fecal Lactopherrin No results for input(s): FECLLACTOFRN in the last 72 hours.  Studies/Results: No results found.  Medications: Scheduled:  enoxaparin (LOVENOX) injection  40 mg Subcutaneous Q24H   escitalopram  5 mg Oral Daily   feeding supplement  237 mL Oral BID BM   melatonin  3 mg Oral QHS   midodrine  30 mg Oral TID WC   octreotide  200 mcg Subcutaneous TID   pantoprazole  40 mg Oral BID AC   rifaximin  550 mg Oral BID   Continuous:  Assessment/Plan: 1)  Decompensated MASH cirrhosis. 2) HRS versus AKI. 3) Hepatic encephalopathy.   The patient's creatinine increased on spironolactone  200 mg every day.  His weight remained about the same at 83 kg.  With his creatinine at 1.8, spironolactone  is ineffective (Cr 1.8-2.0).  His abdomen is not tense, but he notices the distension.  FMTS, per the the request of the family, are reaching out to [facility] to determine candidacy for liver transplant.  This is a reasonable request.  The patient reports that lactulose  takes away his appetite.  Plan: 1) D/C spironolactone . 2) Paracentesis 8 liters to be removed and he will receive 25 grams of albumin . 3) If sodium is <130, he needs to be in 2000 mL fluid restriction. 4) Await [name] evaluation. 5) D/C lactulose  and remain on rifaximin.  Monitor for any recurrence of HE. 6) Continue to monitor renal function. 7) Maintain daily weights.  LOS: 10 days   James Downs D 12/03/2023, 4:28 PM

## 2023-12-03 NOTE — TOC Progression Note (Addendum)
 Transition of Care [facility]) - Progression Note    Patient Details  Name: James Downs MRN: [id number] Date of Birth: March 21, 1965  Transition of [facility] CM/SW Contact  Almarie CHRISTELLA Goodie, [place] Phone Number: 12/03/2023, 10:54 AM  Clinical Narrative:   CSW following for disposition. CSW spoke with [facility] to answer questions and discuss referral as they were considering patient, but they have declined due to likely need for LTC and placement for the spouse, as well. Patient with no other bed offers at this time. CSW to follow.  UPDATE: CSW contacted by [facility] that they are also considering, asking for details about patient and spouse and care needs. CSW contacted patient's daughter in law, [name], to discuss, and [name] is in agreement for [employer] to call her for details. CSW relayed information to [facility], they will reach out and update CSW if they can offer. CSW to follow.    Expected Discharge Plan: Skilled Nursing Facility Barriers to Discharge: English as a second language teacher, Continued Medical Work up, Inadequate or no insurance               Expected Discharge Plan and Services   Discharge Planning Services: CM Consult   Living arrangements for the past 2 months: Single Family Home                                       Social Drivers of Health (SDOH) Interventions SDOH Screenings   Food Insecurity: No Food Insecurity (11/24/2023)  Housing: Unknown (12/01/2023)  Transportation Needs: No Transportation Needs (11/24/2023)  Utilities: Not At Risk (11/24/2023)  Tobacco Use: High Risk (12/01/2023)    Readmission Risk Interventions     No data to display

## 2023-12-04 ENCOUNTER — Inpatient Hospital Stay (HOSPITAL_COMMUNITY)

## 2023-12-04 DIAGNOSIS — Z515 Encounter for palliative care: Secondary | ICD-10-CM | POA: Diagnosis not present

## 2023-12-04 DIAGNOSIS — R188 Other ascites: Secondary | ICD-10-CM | POA: Diagnosis not present

## 2023-12-04 DIAGNOSIS — K7460 Unspecified cirrhosis of liver: Secondary | ICD-10-CM | POA: Diagnosis not present

## 2023-12-04 DIAGNOSIS — N179 Acute kidney failure, unspecified: Secondary | ICD-10-CM | POA: Diagnosis not present

## 2023-12-04 DIAGNOSIS — Z7189 Other specified counseling: Secondary | ICD-10-CM | POA: Diagnosis not present

## 2023-12-04 LAB — BASIC METABOLIC PANEL WITH GFR
Anion gap: 12 (ref 5–15)
BUN: 25 mg/dL — ABNORMAL HIGH (ref 6–20)
CO2: 18 mmol/L — ABNORMAL LOW (ref 22–32)
Calcium: 8.5 mg/dL — ABNORMAL LOW (ref 8.9–10.3)
Chloride: 103 mmol/L (ref 98–111)
Creatinine, Ser: 1.79 mg/dL — ABNORMAL HIGH (ref 0.61–1.24)
GFR, Estimated: 43 mL/min — ABNORMAL LOW (ref >60)
Glucose, Bld: 116 mg/dL — ABNORMAL HIGH (ref 70–99)
Potassium: 4.1 mmol/L (ref 3.5–5.1)
Sodium: 133 mmol/L — ABNORMAL LOW (ref 135–145)

## 2023-12-04 LAB — CBC
HCT: 31.1 % — ABNORMAL LOW (ref 39.0–52.0)
Hemoglobin: 10.5 g/dL — ABNORMAL LOW (ref 13.0–17.0)
MCH: 32.6 pg (ref 26.0–34.0)
MCHC: 33.8 g/dL (ref 30.0–36.0)
MCV: 96.6 fL (ref 80.0–100.0)
Platelets: 102 K/uL — ABNORMAL LOW (ref 150–400)
RBC: 3.22 MIL/uL — ABNORMAL LOW (ref 4.22–5.81)
RDW: 17.5 % — ABNORMAL HIGH (ref 11.5–15.5)
WBC: 10.4 K/uL (ref 4.0–10.5)
nRBC: 0 % (ref 0.0–0.2)

## 2023-12-04 LAB — IRON AND TIBC: Iron: 43 ug/dL — ABNORMAL LOW (ref 45–182)

## 2023-12-04 LAB — FERRITIN: Ferritin: 244 ng/mL (ref 24–336)

## 2023-12-04 MED ORDER — LIDOCAINE-EPINEPHRINE 1 %-1:100000 IJ SOLN
INTRAMUSCULAR | Status: AC
  Filled 2023-12-04: qty 1

## 2023-12-04 MED ORDER — LIDOCAINE-EPINEPHRINE 1 %-1:100000 IJ SOLN
20.0000 mL | Freq: Once | INTRAMUSCULAR | Status: AC
  Administered 2023-12-04: 10 mL via INTRADERMAL

## 2023-12-04 MED ORDER — ALBUMIN HUMAN 25 % IV SOLN
50.0000 g | Freq: Once | INTRAVENOUS | Status: AC
  Administered 2023-12-04: 50 g via INTRAVENOUS
  Filled 2023-12-04: qty 200

## 2023-12-04 NOTE — Assessment & Plan Note (Addendum)
 Afebrile, hemodynamically stable. Awake, alert, and responding appropriately. WBC within normal limits today. Kidney US  showed no hydronephrosis, kidneys within normal limits, cirrhotic liver with moderate ascites.  NA 133. - GI consulted, appreciate recommendations  - Dc'd Lactulose  and spironolactone   - Continue Rifaximin 550 mg PO BID  - Paracentesis 8 L to be removed and 25 g of albumin  given - PT/OT following - Continue delirium precautions - BCx pending but NG @ 5 days - AM CBC/CMP

## 2023-12-04 NOTE — Progress Notes (Signed)
 Subjective: No acute events.  He feels better after the paracentesis.  Objective: Vital signs in last 24 hours: Temp:  [97.7 F (36.5 C)-98.7 F (37.1 C)] 98.2 F (36.8 C) (10/10 1307) Pulse Rate:  [57-64] 59 (10/10 1307) Resp:  [16-18] 17 (10/10 1307) BP: (89-108)/(50-68) 105/60 (10/10 1307) SpO2:  [97 %-100 %] 100 % (10/10 1307) Weight:  [82.4 kg] 82.4 kg (10/10 0500) Last BM Date : 12/03/23  Intake/Output from previous day: 10/09 0701 - 10/10 0700 In: 60 [P.O.:60] Out: 1150 [Urine:800; Stool:350] Intake/Output this shift: No intake/output data recorded.  General appearance: alert and no distress GI: soft, distended with ascites  Lab Results: Recent Labs    12/02/23 0128 12/04/23 0150  WBC 11.7* 10.4  HGB 11.2* 10.5*  HCT 33.3* 31.1*  PLT 99* 102*   BMET Recent Labs    12/02/23 0128 12/03/23 0122 12/04/23 0150  NA 135 134* 133*  K 3.6 4.2 4.1  CL 104 103 103  CO2 20* 19* 18*  GLUCOSE 121* 118* 116*  BUN 22* 23* 25*  CREATININE 1.72* 1.81* 1.79*  CALCIUM 8.6* 8.4* 8.5*   LFT Recent Labs    12/02/23 0128  PROT 5.5*  ALBUMIN  3.1*  AST 30  ALT 15  ALKPHOS 30*  BILITOT 2.9*   PT/INR No results for input(s): LABPROT, INR in the last 72 hours. Hepatitis Panel No results for input(s): HEPBSAG, HCVAB, HEPAIGM, HEPBIGM in the last 72 hours. C-Diff No results for input(s): CDIFFTOX in the last 72 hours. Fecal Lactopherrin No results for input(s): FECLLACTOFRN in the last 72 hours.  Studies/Results: US  RENAL Result Date: 12/03/2023 CLINICAL DATA:  13870 Urinary retention [id number]. EXAM: RENAL / URINARY TRACT ULTRASOUND COMPLETE COMPARISON:  CT scan abdomen and pelvis from 11/23/2023. FINDINGS: Right Kidney: Renal measurements: 4.5 x 4.6 x 8.6 cm = volume: 92.5 mL. Echogenicity within normal limits. No mass or hydronephrosis visualized. Left Kidney: Renal measurements: 4.8 x 5.7 x 10.5 cm = volume: 150.9 mL. Echogenicity within normal limits.  No mass or hydronephrosis visualized. Bladder: Appears normal for degree of bladder distention. Other: Small portion of liver noted, which appears cirrhotic. There is moderate ascites. The technologist noted limited exam due to patient's body habitus and bowel gas. IMPRESSION: 1. Bilateral kidneys are within normal limits. No hydronephrosis. 2. Cirrhotic liver with moderate ascites. Electronically Signed   By: Ree Molt M.D.   On: 12/03/2023 19:04    Medications: Scheduled:  enoxaparin (LOVENOX) injection  40 mg Subcutaneous Q24H   escitalopram  5 mg Oral Daily   feeding supplement  237 mL Oral BID BM   melatonin  3 mg Oral QHS   midodrine  30 mg Oral TID WC   octreotide  200 mcg Subcutaneous TID   pantoprazole  40 mg Oral BID AC   rifaximin  550 mg Oral BID   Continuous:  albumin  human      Assessment/Plan: 1) Decompensated MASH cirrhosis. 2) Ascites - s/p paracentesis. 3) AKI versus HRS.   He is sable and feeling better after the paracentesis. His renal function was stable this AM.  [name] does not deem him appropriate for transfer.  It is agreed that he will need to have routine paracenteses as he does not seem to be able to respond to diuretics.  If his MELD ever improves to <17 TIPS can be considered, but this can exacerbate issues with HE.  Plan: 1) Encouraged mobility. 2) Monitor renal function. 3) Anticipate routine paracentesis. 4) Dr. Leigh will  round this weekend.   LOS: 11 days   James Downs D 12/04/2023, 1:11 PM

## 2023-12-04 NOTE — Progress Notes (Signed)
 [facility] Progress Note   58M decompensated MASH induced cirrhosis admitted 9/29 for sepsis, GIB with course complicated by AKI secondary to HRS, hypotension, recurrent large volume ascites refractory to na restriction and diuretics requiring LVPs.  Nephrology re consulted to comment on CKD and assist in general management.   Subjective:  UOP yesterday.   Had LVP 4.8L today and abd feeling improved.  Did receive IV albumin  concurrently.   No concerns expressed.   Objective Vitals:   12/04/23 0500 12/04/23 0830 12/04/23 1029 12/04/23 1307  BP:  (!) 89/50 108/68 105/60  Pulse:  63 64 (!) 59  Resp:  18  17  Temp:  98.7 F (37.1 C)  98.2 F (36.8 C)  TempSrc:  Oral  Oral  SpO2:  98% 99% 100%  Weight: 82.4 kg     Height:       Physical Exam Chronically ill-appearing, lying in bed.  Jaundiced skin RRR, no murmurs. Abdomen moderately distended Drowsy but arousable to voice and does follow commands and does answer questions. Extremities without edema  Additional Objective Labs: Basic Metabolic Panel: Recent Labs  Lab 12/02/23 0128 12/03/23 0122 12/04/23 0150  NA 135 134* 133*  K 3.6 4.2 4.1  CL 104 103 103  CO2 20* 19* 18*  GLUCOSE 121* 118* 116*  BUN 22* 23* 25*  CREATININE 1.72* 1.81* 1.79*  CALCIUM 8.6* 8.4* 8.5*   Liver Function Tests: Recent Labs  Lab 11/30/23 0453 12/01/23 0506 12/02/23 0128  AST 30 27 30   ALT 16 15 15   ALKPHOS 29* 26* 30*  BILITOT 2.7* 3.1* 2.9*  PROT 5.2* 5.4* 5.5*  ALBUMIN  3.0* 3.2* 3.1*   No results for input(s): LIPASE, AMYLASE in the last 168 hours. CBC: Recent Labs  Lab 11/29/23 0452 11/30/23 0453 12/01/23 0506 12/02/23 0128 12/04/23 0150  WBC 9.2 9.1 9.5 11.7* 10.4  HGB 11.7* 10.7* 10.7* 11.2* 10.5*  HCT 34.5* 31.3* 31.4* 33.3* 31.1*  MCV 95.3 96.9 96.9 97.4 96.6  PLT 106* 94* 93* 99* 102*   Blood Culture    Component Value Date/Time   SDES PERITONEAL 11/23/2023 1245   SPECREQUEST ABDOMEN  11/23/2023 1245   CULT  11/23/2023 1245    NO GROWTH 3 DAYS Performed at [facility], [address] [place], [place] [address]    REPTSTATUS 11/26/2023 FINAL 11/23/2023 1245    Cardiac Enzymes: No results for input(s): CKTOTAL, CKMB, CKMBINDEX, TROPONINI in the last 168 hours. CBG: No results for input(s): GLUCAP in the last 168 hours. Iron Studies:  Recent Labs    12/04/23 0150  IRON 43*  TIBC NOT CALCULATED  FERRITIN 244   @[email or web address] @ Studies/Results: IR Paracentesis Result Date: 12/04/2023 INDICATION: 58 year old male with history of decompensated cirrhosis, with recurrent ascites. IR was requested for therapeutic paracentesis. He 8 L maximum. EXAM: ULTRASOUND GUIDED THERAPEUTIC PARACENTESIS MEDICATIONS: 4 cc of 1% lidocaine  with epi. COMPLICATIONS: None immediate. PROCEDURE: Informed written consent was obtained from the patient after a discussion of the risks, benefits and alternatives to treatment. A timeout was performed prior to the initiation of the procedure. Initial ultrasound scanning demonstrates a large amount of ascites within the right lower abdominal quadrant. The right lower abdomen was prepped and draped in the usual sterile fashion. 1% lidocaine  was used for local anesthesia. Following this, a 19 gauge, 7-cm, Yueh catheter was introduced. An ultrasound image was saved for documentation purposes. The paracentesis was performed. The catheter was removed and a dressing was applied. The patient  tolerated the procedure well without immediate post procedural complication. FINDINGS: A total of approximately 4.8 L of clear, golden yellow colored peritoneal fluid was removed. Samples were sent to the laboratory as requested by the clinical team. IMPRESSION: Successful ultrasound-guided paracentesis yielding 4.8 liters of peritoneal fluid. Procedure performed by Carlin Griffon, PA-C Electronically Signed   By: Cordella Banner   On: 12/04/2023 13:25   US   RENAL Result Date: 12/03/2023 CLINICAL DATA:  13870 Urinary retention 13870. EXAM: RENAL / URINARY TRACT ULTRASOUND COMPLETE COMPARISON:  CT scan abdomen and pelvis from 11/23/2023. FINDINGS: Right Kidney: Renal measurements: 4.5 x 4.6 x 8.6 cm = volume: 92.5 mL. Echogenicity within normal limits. No mass or hydronephrosis visualized. Left Kidney: Renal measurements: 4.8 x 5.7 x 10.5 cm = volume: 150.9 mL. Echogenicity within normal limits. No mass or hydronephrosis visualized. Bladder: Appears normal for degree of bladder distention. Other: Small portion of liver noted, which appears cirrhotic. There is moderate ascites. The technologist noted limited exam due to patient's body habitus and bowel gas. IMPRESSION: 1. Bilateral kidneys are within normal limits. No hydronephrosis. 2. Cirrhotic liver with moderate ascites. Electronically Signed   By: Ree Molt M.D.   On: 12/03/2023 19:04   Medications:  albumin  human      enoxaparin (LOVENOX) injection  40 mg Subcutaneous Q24H   escitalopram  5 mg Oral Daily   feeding supplement  237 mL Oral BID BM   melatonin  3 mg Oral QHS   midodrine  30 mg Oral TID WC   octreotide  200 mcg Subcutaneous TID   pantoprazole  40 mg Oral BID AC   rifaximin  550 mg Oral BID    Assessment/Plan: Acute Kidney Injury on CKD:  noted elevation in creatinine dating back years, to at least 2021 consistent with CKD - appears baseline ~1.5, appearing more recently running 1.5-1.8 after his AKI on admission with Cr up to 2.2 that has improved some with HRS treatment.  No other obvious precipitating factors, urine sodium < 30 c/w HRS.   -Treating HRS with midodrine, octreotide and s/p albumin  -Family aware that in the setting of severe cirrhosis risk for recurrent AKI is high; reiterated to patient today -Strict I's and O's, daily weight, BMP. - Continue midodrine 30 mg TID, octreotide  200 mg TID - BP will be a limiting factor for use of MRB; avoid over aggressive use of  loop diuretic - use lowest effective dose to maintain euvolemia on a PRN basis - hasn't helped manage ascites and other than that he doesn't have need for a diuretic this week. -no role for ongoing daily albumin  BUT if having LVP would supplement with IV albumin   -no other modifiable factors noted - continue to avoid nephrotoxins and hypotension   Non Gap Metabolic acidosis: Mild: Stable, Likely secondary to AKI/CKD.  If serum bicarb running < 18 can start po bicarb, it's ok for now.   Hyponatremia:  Hypervolemic, mild at 133 today.  Fluid and sodium restriction has been implemented.    Decompensated cirrhosis: GI following closely and adjusting meds.  Per notes has been referred to transplant center [facility] - per GI notes deemed not appropriate for transfer.  His ascites has been difficult to manage requiring repeat LVP but currently not a candidate for TIPS given MELD score.    I'm unclear if he's not a transplant candidate or if [facility] just doesn't think he needs to come now -- in the long run this will be the key  question in determining ongoing therapies.  Palliative care is involved here already I see.      Kidney function is fairly stable through this week.   Nothing further to add, will sign off.  GI managing meds.   Manuelita Barters MD 12/04/2023, 1:43 PM  [facility] Pager: [phone]

## 2023-12-04 NOTE — TOC Progression Note (Addendum)
 Transition of Care [facility]) - Progression Note    Patient Details  Name: SACHIN FERENCZ MRN: [id number] Date of Birth: 1965-03-05  [facility] CM/SW Contact  Lauraine FORBES Saa, LCSWA Phone Number: 12/04/2023, 10:15 AM  Clinical Narrative:     10:17 AM Per chart review, [facility], [facility], and [facility] remain considering bed offer. Patient does not currently have any SNF bed offers. CSW expanded SNF search in efforts to obtain bed offer. Currently, patient has 30/68 SNF bed denials. CSW acknowledged [facility] consult for outpatient palliative services. CSW will continue to follow.  Expected Discharge Plan: Skilled Nursing Facility Barriers to Discharge: English as a second language teacher, Continued Medical Work up, Inadequate or no insurance               Expected Discharge Plan and Services   Discharge Planning Services: CM Consult   Living arrangements for the past 2 months: Single Family Home                                       Social Drivers of Health (SDOH) Interventions SDOH Screenings   Food Insecurity: No Food Insecurity (11/24/2023)  Housing: Unknown (12/01/2023)  Transportation Needs: No Transportation Needs (11/24/2023)  Utilities: Not At Risk (11/24/2023)  Tobacco Use: High Risk (12/01/2023)    Readmission Risk Interventions     No data to display

## 2023-12-04 NOTE — Assessment & Plan Note (Addendum)
 Stable, no changes

## 2023-12-04 NOTE — Assessment & Plan Note (Addendum)
 Continue current medication with expectation of full effect in 4 to 6 weeks, recommend outpatient CBT. - Continue Escitalopram 5mg  daily

## 2023-12-04 NOTE — Progress Notes (Signed)
 Palliative Medicine Inpatient Follow Up Note   HPI:  58 y.o. male  with past medical history of non-alcoholic cirrhosis, esophageal varices, gastric ulcer, portal HTN, HLD, nephrolithiasis  admitted on 11/23/2023 with 2-3 days of weakness, abdominal pain, and diarrhea.    11/23/2023: Paracentesis done, 7.9 L  of ascitic fluid removed 11/25/2023: Paracentesis done, 8.3 L of ascitic fluid removed  12/04/2023 Paracentesis done, 4.8 L of ascitic fluid removed   PMT has been consulted to assist with goals of care conversation. Patient/Family face treatment option decisions, advanced directive decisions and anticipatory care needs.    Family face treatment option decision, advance directive decisions and anticipatory care needs.   Today's Discussion 12/04/2023  *Please note that this is a verbal dictation therefore any spelling or grammatical errors are due to the Dragon Medical One system interpretation.  Chart reviewed inclusive of vital signs, progress notes, laboratory results, and diagnostic images.   The patient was seen today and appeared chronically ill, yet remained alert and oriented. He reported feeling improved compared to previous days, particularly following a paracentesis performed earlier today, which successfully drained 4.8 liters of ascitic fluid. He is glad that flexiseal has been discontinued. He denies intermittent hiccups.  The patient expressed hope for continued clinical improvement and prefers to focus on getting better one day at a time. I discussed that paracentesis may become routine as per GI, he does not seem to be responding well to diuretics.   He was referred to [facility] for a liver transplant evaluation; however, [facility] responded that a transfer may not be appropriate at this time given his medical state.   The bedside nurse reported no acute concerns aside from the patient's current inability to void. The patient strongly declined reinsertion of a Foley catheter  and opted to attempt voiding using a urinal instead.  The overall care plan remains unchanged at this time, with continued focus on the current treatment approach and allowing time to assess clinical outcomes. In preparation for discharge, the patient is aware of and agrees with the plan for placement at a skilled nursing facility (SNF), along with ongoing support through outpatient palliative care services.  I encouraged the patient to maintain open communication with his medical team and family regarding his preferences, goals, and values, especially in the context of long-term planning.  Created space and opportunity for patient to explore thoughts feelings and fears regarding current medical situation.  Patient and her family face treatment option decisions, advanced directive decisions and anticipatory care needs.   Questions and concerns addressed   Palliative Support Provided.   Objective Assessment: Vital Signs Vitals:   12/04/23 1029 12/04/23 1307  BP: 108/68 105/60  Pulse: 64 (!) 59  Resp:  17  Temp:  98.2 F (36.8 C)  SpO2: 99% 100%    Intake/Output Summary (Last 24 hours) at 12/04/2023 1459 Last data filed at 12/04/2023 0038 Gross per 24 hour  Intake 60 ml  Output 1150 ml  Net -1090 ml   Last Weight  Most recent update: 12/04/2023  6:30 AM    Weight  82.4 kg (181 lb 9.6 oz)             Physical Exam: General: Alert, chronically-ill appearing, pale looking HEENT: No sign of trauma, EOM grossly intact. Cardiovascular: RRR, HR 61 BPM Pulmonary: Normal WOB. CTAB with no w/c/r present. Abdomen: Moderately distended, fluid wave is present. Extremities: Warm, 2+ edema in bilateral LE. Neurologic: No focal deficits, alert and oriented to person  place and time. Skin: Warm and dry.  SUMMARY OF RECOMMENDATIONS    Code Status: Maintain DNR/DNI status Goal of care currently is medical stabilization, allowing time for outcomes Discussed the importance of  continued conversation with family and the medical providers regarding overall plan of care and treatment options, ensuring decisions are within the context of the patient's values and GOCs.   Awaiting bed offers from skilled nursing facilities. Patient is agreeable with outpatient palliative services.  Continue to provide psycho-social and emotional support to patient and family. Palliative medicine team will continue to follow.    Symptom Management: Per Primary team Paracentesis performed 12/04/2023, drained 4.8L of ascitic fluid.  Palliative medicine is available to assist as needed.    Time Spent: 35 minutes  Detailed review of medical records (labs, imaging, vital signs), medically appropriate exam, discussed with treatment team, counseling and education to patient, family, & staff, documenting clinical information, coordination of care.  ____________________________________________________________________ Kathlyne Bolder NP-C [place] Palliative Medicine Team Team Cell Phone: [phone] Please utilize secure chat with additional questions, if there is no response within 30 minutes please call the above phone number  Palliative Medicine Team providers are available by phone from 7am to 7pm daily and can be reached through the team cell phone.  Should this patient require assistance outside of these hours, please call the patient's attending physician.

## 2023-12-04 NOTE — TOC Progression Note (Signed)
 Transition of Care [facility]) - Progression Note    Patient Details  Name: James Downs MRN: [id number] Date of Birth: [birth year]  [facility] CM/SW Contact  Whitfield Campanile, Student-Social Work Phone Number: 12/04/2023, 2:33 PM  Clinical Narrative:     2:22 PM: MSW Intern called patient's daughter Carylon [phone]) to discuss SNF bed offers.   2:37 PM: CSW emailed daughter accepted SNF offer list. ([email or web address])  TOC will follow up for SNF choice and continue to assist with discharge planning.  Whitfield Campanile, MSW Intern  Expected Discharge Plan: Skilled Nursing Facility Barriers to Discharge: English as a second language teacher, Continued Medical Work up, Inadequate or no insurance               Expected Discharge Plan and Services   Discharge Planning Services: CM Consult   Living arrangements for the past 2 months: Single Family Home                                       Social Drivers of Health (SDOH) Interventions SDOH Screenings   Food Insecurity: No Food Insecurity (11/24/2023)  Housing: Unknown (12/01/2023)  Transportation Needs: No Transportation Needs (11/24/2023)  Utilities: Not At Risk (11/24/2023)  Tobacco Use: High Risk (12/01/2023)    Readmission Risk Interventions     No data to display

## 2023-12-04 NOTE — Progress Notes (Signed)
 Physical Therapy Treatment Patient Details Name: James Downs MRN: [id number] DOB: Aug 14, 1965 Today's Date: 12/04/2023   History of Present Illness 58 year old male presenting 9/29 with weakness, abdominal pain, diarrhea, and several days of altered mental status. Dx Hepatic encephalopathy, Decompensated NASH cirrhosis, Ascites, AKI with CKD. PMHof non-alcoholic cirrhosis, HTN, HLD, and nephrolithiasis,    PT Comments  The pt is making gradual progress again as he was able to stand several minutes today, allowing him to take a few steps to step pivot OOB to the recliner. However, gait was limited by bowel incontinence after ambulating ~2 ft. He requires increased time to complete all functional mobility along with rest breaks due to generalized weakness and balance deficits. He remains at risk for falls. Will continue to follow acutely.     If plan is discharge home, recommend the following: A lot of help with walking and/or transfers;A lot of help with bathing/dressing/bathroom;Assistance with cooking/housework;Assist for transportation;Help with stairs or ramp for entrance   Can travel by private vehicle     Yes  Equipment Recommendations  Wheelchair (measurements PT);BSC/3in1    Recommendations for Other Services       Precautions / Restrictions Precautions Precautions: Fall Recall of Precautions/Restrictions: Impaired Precaution/Restrictions Comments: bowel incontinence Restrictions Weight Bearing Restrictions Per Provider Order: No     Mobility  Bed Mobility Overal bed mobility: Needs Assistance Bed Mobility: Supine to Sit     Supine to sit: Min assist, HOB elevated, Used rails     General bed mobility comments: MinA needed to bring legs off R EOB, needing cues to slide them one by one until off EOB. Cues provided for pt to reach bil hands to R rail and prop up on R elbow to push up to sit up, good success noted with extra time to complete    Transfers Overall  transfer level: Needs assistance Equipment used: Rolling walker (2 wheels) Transfers: Bed to chair/wheelchair/BSC, Sit to/from Stand Sit to Stand: Mod assist   Step pivot transfers: Min assist       General transfer comment: ModA needed to power up to stand and gain balance. Once upright, pt only needed minA for balance to step pivot to R EOB to recliner with RW support. Prior to being able to sit, pt had bowel incontinence    Ambulation/Gait Ambulation/Gait assistance: Min assist Gait Distance (Feet): 2 Feet Assistive device: Rolling walker (2 wheels) Gait Pattern/deviations: Step-through pattern, Decreased stride length, Trunk flexed Gait velocity: reduced Gait velocity interpretation: <1.31 ft/sec, indicative of household ambulator   General Gait Details: Pt takes slow, small steps to step pivot to R from bed to chair with RW support and minA for balance. Deferred further gait due to bowel incontinence after x2 ft.   Stairs             Wheelchair Mobility     Tilt Bed    Modified Rankin (Stroke Patients Only)       Balance Overall balance assessment: Needs assistance Sitting-balance support: No upper extremity supported, Feet supported, Bilateral upper extremity supported Sitting balance-Leahy Scale: Good Sitting balance - Comments: Able to sit EOB with and without UE support. Sat EOB > 10 min per pt request   Standing balance support: Bilateral upper extremity supported, During functional activity, Reliant on assistive device for balance Standing balance-Leahy Scale: Poor Standing balance comment: reliant on RW and CGA-minA to stand statically. Pt stood for a few minutes before sitting  Communication Communication Communication: No apparent difficulties  Cognition Arousal: Alert Behavior During Therapy: WFL for tasks assessed/performed   PT - Cognitive impairments: Sequencing, Problem solving                        PT - Cognition Comments: Increased time to initiate and follow commands, could be related to generalized fatigue/weakness Following commands: Impaired Following commands impaired: Follows multi-step commands with increased time    Cueing Cueing Techniques: Verbal cues, Tactile cues  Exercises General Exercises - Lower Extremity Long Arc Quad: AROM, Strengthening, Both, 10 reps, Seated    General Comments        Pertinent Vitals/Pain Pain Assessment Pain Assessment: Faces Faces Pain Scale: Hurts little more Pain Location: L knee Pain Descriptors / Indicators: Discomfort, Grimacing, Sore Pain Intervention(s): Monitored during session, Limited activity within patient's tolerance, Repositioned    Home Living                          Prior Function            PT Goals (current goals can now be found in the care plan section) Acute Rehab PT Goals Patient Stated Goal: agreeable to short term rehab PT Goal Formulation: With patient Time For Goal Achievement: 12/08/23 Potential to Achieve Goals: Fair Progress towards PT goals: Progressing toward goals    Frequency    Min 2X/week      PT Plan      Co-evaluation              AM-PAC PT 6 Clicks Mobility   Outcome Measure  Help needed turning from your back to your side while in a flat bed without using bedrails?: A Little Help needed moving from lying on your back to sitting on the side of a flat bed without using bedrails?: A Little Help needed moving to and from a bed to a chair (including a wheelchair)?: A Little Help needed standing up from a chair using your arms (e.g., wheelchair or bedside chair)?: A Lot Help needed to walk in hospital room?: Total Help needed climbing 3-5 steps with a railing? : Total 6 Click Score: 13    End of Session Equipment Utilized During Treatment: Gait belt Activity Tolerance: Patient tolerated treatment well;Other (comment) (limited by bowel  incontinence) Patient left: with call bell/phone within reach;in chair;with chair alarm set Nurse Communication: Mobility status PT Visit Diagnosis: Unsteadiness on feet (R26.81);Other abnormalities of gait and mobility (R26.89);Muscle weakness (generalized) (M62.81);Difficulty in walking, not elsewhere classified (R26.2);Other symptoms and signs involving the nervous system (R29.898)     Time: 9097-9069 PT Time Calculation (min) (ACUTE ONLY): 28 min  Charges:    $Therapeutic Activity: 23-37 mins PT General Charges $$ ACUTE PT VISIT: 1 Visit                     Theo Ferretti, PT, DPT Acute Rehabilitation Services  Office: [phone]    Theo CHRISTELLA Ferretti 12/04/2023, 11:46 AM

## 2023-12-04 NOTE — Progress Notes (Addendum)
 Daily Progress Note Intern Pager: [phone]  Patient name: James Downs Medical record number: [id number] Date of birth: 03/06/1965 Age: 58 y.o. Gender: male  Primary Care Provider: Patient, No Pcp Per Consultants: GI, palliative, nephrology (signed off)  Code Status: DNR Limited   Pt Overview and Major Events to Date:  9/29: Admitted   Medical Decision Making:  James Downs is a 58 y.o. male with a PMH of MASH, varices, gastric ulcer, portal hypertension, HLD, essential HTN, GAD, tobacco use, OSA, and nephrolithiasis who presented with AMS, now resolved, 2/2 decompensated liver cirrhosis with hepatorenal syndrome.  Assessment & Plan Cirrhosis of liver with ascites (HCC) Hepatorenal syndrome (HCC) Esophageal varices in cirrhosis (HCC) Afebrile, hemodynamically stable. Awake, alert, and responding appropriately. WBC within normal limits today. Kidney US  showed no hydronephrosis, kidneys within normal limits, cirrhotic liver with moderate ascites.  NA 133. - GI consulted, appreciate recommendations  - Dc'd Lactulose  and spironolactone   - Continue Rifaximin 550 mg PO BID  - Paracentesis 8 L to be removed and 25 g of albumin  given - PT/OT following - Continue delirium precautions - BCx pending but NG @ 5 days - AM CBC/CMP AKI (acute kidney injury) Creatinine 1.81--> 1.79. Continue to monitor. MAPs 63-81. - Will reconsult nephrology today - continue midodrine at 30 mg TID today given improving creatinine despite not at goal MAP - continue octreotide 200 mcg TID - strict I&Os, daily weights - AM CMP Black stool Stable, no changes. Reactive perforating collagenosis No new concerns regarding lesions today.  - treating cirrhosis as above Depression Continue current medication with expectation of full effect in 4 to 6 weeks, recommend outpatient CBT. - Continue Escitalopram 5mg  daily  FEN/GI: Sodium restriction  PPx: Lovenox  Dispo: SNF pending clinical  improvement.   Subjective:  Patient states he is doing okay this morning.  States that he slept okay last night.  Denies any pain this morning.  He has no other concerns at this time.  Objective: Temp:  [97.7 F (36.5 C)-98.5 F (36.9 C)] 97.7 F (36.5 C) (10/10 0409) Pulse Rate:  [57-66] 60 (10/10 0409) Resp:  [16-19] 16 (10/10 0409) BP: (91-107)/(54-69) 91/55 (10/10 0409) SpO2:  [95 %-99 %] 97 % (10/10 0409) Weight:  [82.4 kg] 82.4 kg (10/10 0500) Physical Exam: General: Chronically ill, NAD Cardiovascular: RRR, no M/R/G Respiratory: CTAB, normal work of breathing on room air Abdomen: Distended, diffusely tender to palpation, no guarding or rebound tenderness Extremities: SCDs in place  Laboratory: Most recent CBC Lab Results  Component Value Date   WBC 10.4 12/04/2023   HGB 10.5 (L) 12/04/2023   HCT 31.1 (L) 12/04/2023   MCV 96.6 12/04/2023   PLT 102 (L) 12/04/2023   Most recent BMP    Latest Ref Rng & Units 12/04/2023    1:50 AM  BMP  Glucose 70 - 99 mg/dL 883   BUN 6 - 20 mg/dL 25   Creatinine 9.38 - 1.24 mg/dL 8.20   Sodium 864 - 854 mmol/L 133   Potassium 3.5 - 5.1 mmol/L 4.1   Chloride 98 - 111 mmol/L 103   CO2 22 - 32 mmol/L 18   Calcium 8.9 - 10.3 mg/dL 8.5     Other pertinent labs: Iron 43 Ferritin 244  Imaging/Diagnostic Tests: No new imaging.   James Raguel MATSU, DO 12/04/2023, 8:01 AM  PGY-1, [facility] FPTS Intern pager: [phone], text pages welcome Secure chat group [facility] Teaching Service

## 2023-12-04 NOTE — Procedures (Signed)
 PROCEDURE SUMMARY:  Successful image-guided paracentesis from the left abdomen.  Yielded 4.8 liters of clear, golden-colored peritoneal fluid.  No immediate complications.  EBL: zero Patient tolerated well.   Please see imaging section of Epic for full dictation.  Olivette Beckmann A Adem Costlow PA-C 12/04/2023 1:15 PM

## 2023-12-04 NOTE — Assessment & Plan Note (Addendum)
 Creatinine 1.81--> 1.79. Continue to monitor. MAPs 63-81. - Will reconsult nephrology today - continue midodrine at 30 mg TID today given improving creatinine despite not at goal MAP - continue octreotide 200 mcg TID - strict I&Os, daily weights - AM CMP

## 2023-12-04 NOTE — Assessment & Plan Note (Addendum)
 No new concerns regarding lesions today.  - treating cirrhosis as above

## 2023-12-05 DIAGNOSIS — K7682 Hepatic encephalopathy: Secondary | ICD-10-CM | POA: Diagnosis not present

## 2023-12-05 DIAGNOSIS — K7460 Unspecified cirrhosis of liver: Secondary | ICD-10-CM | POA: Diagnosis not present

## 2023-12-05 DIAGNOSIS — Z515 Encounter for palliative care: Secondary | ICD-10-CM

## 2023-12-05 DIAGNOSIS — R188 Other ascites: Secondary | ICD-10-CM | POA: Diagnosis not present

## 2023-12-05 DIAGNOSIS — Z7189 Other specified counseling: Secondary | ICD-10-CM

## 2023-12-05 DIAGNOSIS — N179 Acute kidney failure, unspecified: Secondary | ICD-10-CM | POA: Diagnosis not present

## 2023-12-05 LAB — COMPREHENSIVE METABOLIC PANEL WITH GFR
ALT: 15 U/L (ref 0–44)
AST: 29 U/L (ref 15–41)
Albumin: 3 g/dL — ABNORMAL LOW (ref 3.5–5.0)
Alkaline Phosphatase: 29 U/L — ABNORMAL LOW (ref 38–126)
Anion gap: 10 (ref 5–15)
BUN: 26 mg/dL — ABNORMAL HIGH (ref 6–20)
CO2: 20 mmol/L — ABNORMAL LOW (ref 22–32)
Calcium: 8.4 mg/dL — ABNORMAL LOW (ref 8.9–10.3)
Chloride: 104 mmol/L (ref 98–111)
Creatinine, Ser: 1.79 mg/dL — ABNORMAL HIGH (ref 0.61–1.24)
GFR, Estimated: 43 mL/min — ABNORMAL LOW (ref >60)
Glucose, Bld: 122 mg/dL — ABNORMAL HIGH (ref 70–99)
Potassium: 4.2 mmol/L (ref 3.5–5.1)
Sodium: 134 mmol/L — ABNORMAL LOW (ref 135–145)
Total Bilirubin: 2.4 mg/dL — ABNORMAL HIGH (ref 0.0–1.2)
Total Protein: 5.6 g/dL — ABNORMAL LOW (ref 6.5–8.1)

## 2023-12-05 LAB — CBC
HCT: 29.7 % — ABNORMAL LOW (ref 39.0–52.0)
Hemoglobin: 10.1 g/dL — ABNORMAL LOW (ref 13.0–17.0)
MCH: 33.1 pg (ref 26.0–34.0)
MCHC: 34 g/dL (ref 30.0–36.0)
MCV: 97.4 fL (ref 80.0–100.0)
Platelets: 98 K/uL — ABNORMAL LOW (ref 150–400)
RBC: 3.05 MIL/uL — ABNORMAL LOW (ref 4.22–5.81)
RDW: 17.9 % — ABNORMAL HIGH (ref 11.5–15.5)
WBC: 7.8 K/uL (ref 4.0–10.5)
nRBC: 0 % (ref 0.0–0.2)

## 2023-12-05 LAB — TRANSFERRIN: Transferrin: <70 mg/dL — ABNORMAL LOW (ref 180–329)

## 2023-12-05 MED ORDER — ADULT MULTIVITAMIN W/MINERALS CH
1.0000 | ORAL_TABLET | Freq: Every day | ORAL | Status: DC
  Administered 2023-12-06 – 2023-12-11 (×6): 1 via ORAL
  Filled 2023-12-05 (×6): qty 1

## 2023-12-05 MED ORDER — ENSURE PLUS HIGH PROTEIN PO LIQD
237.0000 mL | Freq: Three times a day (TID) | ORAL | Status: DC
  Administered 2023-12-05 – 2023-12-10 (×12): 237 mL via ORAL

## 2023-12-05 NOTE — Assessment & Plan Note (Addendum)
 Afebrile, hemodynamically stable. Awake, alert, and responding appropriately. WBC within normal limits today. Kidney US  showed no hydronephrosis, kidneys within normal limits, cirrhotic liver with moderate ascites.  Na 134. - GI consulted, appreciate recommendations  - Dc'd Lactulose  and spironolactone   - Continue Rifaximin 550 mg PO BID             - Had LVP with over 4 L off on 10/10 - PT/OT following - Continue delirium precautions - BCx pending but NG @ 5 days - AM CBC/CMP

## 2023-12-05 NOTE — Assessment & Plan Note (Addendum)
 Continue current medication with expectation of full effect in 4 to 6 weeks, recommend outpatient CBT. - Continue Escitalopram 5mg  daily

## 2023-12-05 NOTE — Assessment & Plan Note (Addendum)
 Stable, no changes

## 2023-12-05 NOTE — Assessment & Plan Note (Addendum)
 No new concerns regarding lesions today.  - treating cirrhosis as above

## 2023-12-05 NOTE — Progress Notes (Signed)
 Initial Nutrition Assessment  DOCUMENTATION CODES:   Not applicable  INTERVENTION:  - Recommend liberalizing back to 2g Na diet. - Ensure Plus High Protein po TID, each supplement provides 350 kcal and 20 grams of protein. - Add Multivitamin with minerals daily. - Monitor weight trends.   NUTRITION DIAGNOSIS:   Unintentional weight loss related to chronic illness (MASH cirrhosis) as evidenced by percent weight loss (25% in 6.5 months).  GOAL:   Patient will meet greater than or equal to 90% of their needs  MONITOR:   PO intake, Supplement acceptance, Labs, Weight trends, I & O's  REASON FOR ASSESSMENT:   Consult Assessment of nutrition requirement/status  ASSESSMENT:   58 y.o. male with a PMH of MASH cirrhosis, varices, gastric ulcer, portal hypertension, HLD, essential HTN, GAD, tobacco use, OSA, and nephrolithiasis who presented with AMS, now resolved, 2/2 decompensated liver cirrhosis with hepatorenal syndrome.  RD working remotely. Called patient via bedside telephone to obtain nutrition history but unable to reach patient.  Per chart review, patient weighed at 239# in March and weighed this admission at 180#. This is a 59# or 25% weight loss in 6.5 months, which is severe for the time frame.   Patient previously on a 2 gram sodium diet with fluid restriction but yesterday evening was placed on a renal diet with fluid restriction. There have been no meal intakes documented in over a week to assess how patient has been eating. Patient ordering 1-3 meals daily per the [employer]. He is drinking ensure usually twice daily.  Reached out to MD to discuss diet change back to 2 gram Na diet to avoid restricting intake with the renal diet however no response received. Will continue Ensure to support intake, increase to TID.   Of note, plan for patient to discharge with outpatient palliative services.   Admit weight: 180# Current weight: 181# I&O's: -10.4L  since admit  Medications reviewed and include: Protonix  Labs reviewed:  Na 134 Creatinine 1.79 HA1C 3.7 (as of 9/29)  NUTRITION - FOCUSED PHYSICAL EXAM:  RD working remotely  Diet Order:   Diet Order             Diet renal with fluid restriction Fluid restriction: 1200 mL Fluid; Room service appropriate? Yes; Fluid consistency: Thin  Diet effective now                   EDUCATION NEEDS:  Not appropriate for education at this time  Skin:  Skin Assessment: Reviewed RN Assessment  Last BM:  10/10 - type 6  Height:  Ht Readings from Last 1 Encounters:  11/23/23 5' 7 (1.702 m)   Weight:  Wt Readings from Last 1 Encounters:  12/04/23 82.4 kg   Ideal Body Weight:  67.27 kg  BMI:  Body mass index is 28.44 kg/m.  Estimated Nutritional Needs:  Kcal:  2100-2300 kcals Protein:  100-115 grams Fluid:  >/= 2.1L or per MD    Trude Ned RD, LDN Contact via Secure Chat.

## 2023-12-05 NOTE — Progress Notes (Signed)
 Palliative Medicine Inpatient Follow Up Note   HPI:  58 y.o. male  with past medical history of non-alcoholic cirrhosis, esophageal varices, gastric ulcer, portal HTN, HLD, nephrolithiasis  admitted on 11/23/2023 with 2-3 days of weakness, abdominal pain, and diarrhea.    11/23/2023: Paracentesis done, 7.9 L  of ascitic fluid removed 11/25/2023: Paracentesis done, 8.3 L of ascitic fluid removed  12/04/2023 Paracentesis done, 4.8 L of ascitic fluid removed   PMT has been consulted to assist with goals of care conversation. Patient/Family face treatment option decisions, advanced directive decisions and anticipatory care needs.    Family face treatment option decision, advance directive decisions and anticipatory care needs.   Today's Discussion 12/05/2023  *Please note that this is a verbal dictation therefore any spelling or grammatical errors are due to the Dragon [employer] One system interpretation.  Chart reviewed inclusive of vital signs, progress notes, laboratory results, and diagnostic images.   I visited the patient at bedside today. He reports feeling well and is not experiencing any acute distress. He denies any pain or discomfort. While his appetite remains below optimal, he notes that it is "better than it has been." He continues to take things one day at a time to avoid feeling overwhelmed.  The patient shared that he participated in therapy yesterday and tolerated it well, including sitting upright in the recliner for approximately 1.5 hours. He remains hopeful and is eager for updates regarding the liver transplant referral. I informed him that, per the most recent GI consult note, [name] does not consider him a suitable candidate for transfer at this time. He expressed understanding of this decision.  The overall care plan remains unchanged. We will continue to allow time for clinical outcomes to evolve. Disposition planning is ongoing, with efforts focused on identifying and  securing a SNF placement once the patient is medically stable for discharge.  I encouraged the patient to maintain open communication with his medical team and family regarding his preferences, goals, and values, especially in the context of long-term planning.  Created space and opportunity for patient to explore thoughts feelings and fears regarding current medical situation.  Patient and her family face treatment option decisions, advanced directive decisions and anticipatory care needs.   Questions and concerns addressed   Palliative Support Provided.   Objective Assessment: Vital Signs Vitals:   12/05/23 1233 12/05/23 1544  BP: (!) 96/56 (!) 101/59  Pulse: 66 64  Resp: 18 16  Temp: 98.6 F (37 C) 98.5 F (36.9 C)  SpO2: 99% 98%    Intake/Output Summary (Last 24 hours) at 12/05/2023 1618 Last data filed at 12/05/2023 0433 Gross per 24 hour  Intake 237 ml  Output 425 ml  Net -188 ml   Last Weight  Most recent update: 12/04/2023  6:30 AM    Weight  82.4 kg (181 lb 9.6 oz)             Physical Exam: General: Alert, chronically-ill appearing, pale looking HEENT: No sign of trauma, EOM grossly intact. Cardiovascular: RRR, HR 61 BPM Pulmonary: Normal WOB. CTAB with no w/c/r present. Abdomen: Moderately distended, fluid wave is present. Extremities: Warm, 2+ edema in bilateral LE. Neurologic: No focal deficits, alert and oriented to person place and time. Skin: Warm and dry.  SUMMARY OF RECOMMENDATIONS    Code Status: Maintain DNR/DNI status Goal of care currently is medical stabilization, allowing time for outcomes Discussed the importance of continued conversation with family and the medical providers regarding overall plan  of care and treatment options, ensuring decisions are within the context of the patient's values and GOCs.   Awaiting bed offers from skilled nursing facilities. Patient is agreeable with outpatient palliative services.  Continue to provide  psycho-social and emotional support to patient and family. Palliative medicine team will continue to follow.   Symptom Management: Per Primary team Paracentesis performed 12/04/2023, drained 4.8L of ascitic fluid.  Palliative medicine is available to assist as needed.    Time Spent: 35 minutes  Detailed review of medical records (labs, imaging, vital signs), medically appropriate exam, discussed with treatment team, counseling and education to patient, family, & staff, documenting clinical information, coordination of care.  ____________________________________________________________________ Kathlyne Bolder NP-C [place] Palliative Medicine Team Team Cell Phone: [phone] Please utilize secure chat with additional questions, if there is no response within 30 minutes please call the above phone number  Palliative Medicine Team providers are available by phone from 7am to 7pm daily and can be reached through the team cell phone.  Should this patient require assistance outside of these hours, please call the patient's attending physician.

## 2023-12-05 NOTE — Assessment & Plan Note (Addendum)
 Creatinine 1.79. Continue to monitor. MAPs 65-81. - Nephrology signed off - continue midodrine at 30 mg TID - continue octreotide 200 mcg TID - strict I&Os, daily weights - AM RFP

## 2023-12-05 NOTE — Plan of Care (Signed)
  Problem: Education: Goal: Knowledge of General Education information will improve Description: Including pain rating scale, medication(s)/side effects and non-pharmacologic comfort measures Outcome: Progressing   Problem: Clinical Measurements: Goal: Will remain free from infection Outcome: Progressing Goal: Respiratory complications will improve Outcome: Progressing   Problem: Coping: Goal: Level of anxiety will decrease Outcome: Progressing   

## 2023-12-05 NOTE — Progress Notes (Addendum)
 Daily Progress Note Intern Pager: [phone]  Patient name: James Downs Medical record number: [id number] Date of birth: November 07, 1965 Age: 58 y.o. Gender: male  Primary Care Provider: Patient, No Pcp Per Consultants: GI, palliative, nephrology (signed off)  Code Status: DNR Limited    Pt Overview and Major Events to Date:  9/29: Admitted    Medical Decision Making:   James Downs is a 58 y.o. male with a PMH of MASH, varices, gastric ulcer, portal hypertension, HLD, essential HTN, GAD, tobacco use, OSA, and nephrolithiasis who presented with AMS, now resolved, 2/2 decompensated liver cirrhosis with hepatorenal syndrome. SNF is recommended, and he has been denied by 30 locations so far.  Assessment & Plan Cirrhosis of liver with ascites (HCC) Hepatorenal syndrome (HCC) Esophageal varices in cirrhosis (HCC) Afebrile, hemodynamically stable. Awake, alert, and responding appropriately. WBC within normal limits today. Kidney US  showed no hydronephrosis, kidneys within normal limits, cirrhotic liver with moderate ascites.  Na 134. - GI consulted, appreciate recommendations  - Dc'd Lactulose  and spironolactone   - Continue Rifaximin 550 mg PO BID             - Had LVP with over 4 L off on 10/10 - PT/OT following - Continue delirium precautions - BCx pending but NG @ 5 days - AM CBC/CMP AKI (acute kidney injury) Creatinine 1.79. Continue to monitor. MAPs 65-81. - Nephrology signed off - continue midodrine at 30 mg TID - continue octreotide 200 mcg TID - strict I&Os, daily weights - AM RFP Black stool Stable, no changes. Reactive perforating collagenosis No new concerns regarding lesions today.  - treating cirrhosis as above Depression Continue current medication with expectation of full effect in 4 to 6 weeks, recommend outpatient CBT. - Continue Escitalopram 5mg  daily   FEN/GI: Sodium restriction  PPx: Lovenox  Dispo: SNF pending clinical  improvement/acceptance.  Subjective:  Pt reports having slightly better energy today, but still low overall. Prefers to use the urinal. Sleep was good. Denies pain, fever, SOB, N/V/D, or any other concerns today.  Objective: Temp:  [97.6 F (36.4 C)-98.2 F (36.8 C)] 98.2 F (36.8 C) (10/11 0826) Pulse Rate:  [54-65] 64 (10/11 0826) Resp:  [16-18] 16 (10/11 0826) BP: (87-105)/(54-60) 92/57 (10/11 0826) SpO2:  [97 %-100 %] 98 % (10/11 0826)  Physical Exam Vitals and nursing note reviewed.  Constitutional:      General: He is not in acute distress.    Appearance: He is not toxic-appearing.  Cardiovascular:     Rate and Rhythm: Normal rate and regular rhythm.     Pulses: Normal pulses.     Heart sounds: No murmur heard. Pulmonary:     Effort: Pulmonary effort is normal. No respiratory distress.     Breath sounds: Normal breath sounds. No stridor. No wheezing or rhonchi.  Abdominal:     General: There is no distension.     Palpations: Abdomen is soft.     Tenderness: There is no abdominal tenderness.  Musculoskeletal:     Right lower leg: No edema.     Left lower leg: No edema.  Skin:    General: Skin is warm and dry.  Neurological:     Mental Status: He is alert.      Laboratory: Most recent CBC Lab Results  Component Value Date   WBC 7.8 12/05/2023   HGB 10.1 (L) 12/05/2023   HCT 29.7 (L) 12/05/2023   MCV 97.4 12/05/2023   PLT 98 (L) 12/05/2023  Most recent BMP    Latest Ref Rng & Units 12/05/2023    3:23 AM  BMP  Glucose 70 - 99 mg/dL 877   BUN 6 - 20 mg/dL 26   Creatinine 9.38 - 1.24 mg/dL 8.20   Sodium 864 - 854 mmol/L 134   Potassium 3.5 - 5.1 mmol/L 4.2   Chloride 98 - 111 mmol/L 104   CO2 22 - 32 mmol/L 20   Calcium 8.9 - 10.3 mg/dL 8.4    Imaging/Diagnostic Tests: Radiologist Impression: US  LIVER DOPPLER Result Date: 12/04/2023 CLINICAL DATA:  Ascites EXAM: DUPLEX ULTRASOUND OF LIVER TECHNIQUE: Color and duplex Doppler ultrasound was  performed to evaluate the hepatic in-flow and out-flow vessels. COMPARISON:  None Available. FINDINGS: Liver: Diffusely increased in echogenicity. Liver is shrunken with mild nodularity. No focal lesion, mass or intrahepatic biliary ductal dilatation. Main Portal Vein size: 0.84 cm Portal Vein Velocities-all hepatopetal in nature. Main Prox:  22.4 cm/sec Main Mid: 19 cm/sec Main Dist:  22.7 cm/sec Right: Not well-visualized Left: 28 cm/sec Hepatic Vein Velocities-all hepatofugal in nature. Right:  16.5 cm/sec Middle:  27 cm/sec Left:  17.6 cm/sec IVC: Present and patent with normal respiratory phasicity. Hepatic Artery Velocity:  40.7 cm/sec Splenic Vein Velocity:  16.7 cm/sec Spleen: 17.2 cm x 8.3 cm x 7.0 cm with a total volume of 519 cm^3 (411 cm^3 is upper limit normal) Portal Vein Occlusion/Thrombus: No Splenic Vein Occlusion/Thrombus: No Ascites: Present Varices: None IMPRESSION: Cirrhotic change of the liver. Diffuse ascites. No reversal of portal venous flow. Electronically Signed   By: Oneil Devonshire M.D.   On: 12/04/2023 23:35   IR Paracentesis Result Date: 12/04/2023 INDICATION: 58 year old male with history of decompensated cirrhosis, with recurrent ascites. IR was requested for therapeutic paracentesis. He 8 L maximum. EXAM: ULTRASOUND GUIDED THERAPEUTIC PARACENTESIS MEDICATIONS: 4 cc of 1% lidocaine  with epi. COMPLICATIONS: None immediate. PROCEDURE: Informed written consent was obtained from the patient after a discussion of the risks, benefits and alternatives to treatment. A timeout was performed prior to the initiation of the procedure. Initial ultrasound scanning demonstrates a large amount of ascites within the right lower abdominal quadrant. The right lower abdomen was prepped and draped in the usual sterile fashion. 1% lidocaine  was used for local anesthesia. Following this, a 19 gauge, 7-cm, Yueh catheter was introduced. An ultrasound image was saved for documentation purposes. The  paracentesis was performed. The catheter was removed and a dressing was applied. The patient tolerated the procedure well without immediate post procedural complication. FINDINGS: A total of approximately 4.8 L of clear, golden yellow colored peritoneal fluid was removed. Samples were sent to the laboratory as requested by the clinical team. IMPRESSION: Successful ultrasound-guided paracentesis yielding 4.8 liters of peritoneal fluid. Procedure performed by Carlin Griffon, PA-C Electronically Signed   By: Cordella Banner   On: 12/04/2023 13:25   Lera Nancyann NOVAK, DO 12/05/2023, 12:00 PM  PGY-1, [facility] Family Medicine FPTS Intern pager: [phone], text pages welcome Secure chat group [facility] Teaching Service

## 2023-12-06 DIAGNOSIS — N179 Acute kidney failure, unspecified: Secondary | ICD-10-CM | POA: Diagnosis not present

## 2023-12-06 LAB — RENAL FUNCTION PANEL
Albumin: 2.7 g/dL — ABNORMAL LOW (ref 3.5–5.0)
Anion gap: 13 (ref 5–15)
BUN: 28 mg/dL — ABNORMAL HIGH (ref 6–20)
CO2: 20 mmol/L — ABNORMAL LOW (ref 22–32)
Calcium: 8 mg/dL — ABNORMAL LOW (ref 8.9–10.3)
Chloride: 103 mmol/L (ref 98–111)
Creatinine, Ser: 1.8 mg/dL — ABNORMAL HIGH (ref 0.61–1.24)
GFR, Estimated: 43 mL/min — ABNORMAL LOW (ref >60)
Glucose, Bld: 133 mg/dL — ABNORMAL HIGH (ref 70–99)
Phosphorus: 2.6 mg/dL (ref 2.5–4.6)
Potassium: 4 mmol/L (ref 3.5–5.1)
Sodium: 136 mmol/L (ref 135–145)

## 2023-12-06 MED ORDER — ONDANSETRON HCL 4 MG/2ML IJ SOLN
4.0000 mg | Freq: Three times a day (TID) | INTRAMUSCULAR | Status: DC | PRN
  Administered 2023-12-06 – 2023-12-10 (×2): 4 mg via INTRAVENOUS
  Filled 2023-12-06 (×2): qty 2

## 2023-12-06 NOTE — Assessment & Plan Note (Addendum)
 Creatinine 1.80. Continue to monitor. MAPs 68-72. - Nephrology - continue midodrine at 30 mg TID  - continue octreotide 200 mcg TID - strict I&Os, daily weights - AM CMP

## 2023-12-06 NOTE — Assessment & Plan Note (Addendum)
 Afebrile, hemodynamically stable. Awake, alert, and responding appropriately. WBC within normal limits today. Kidney US  showed no hydronephrosis, kidneys within normal limits, cirrhotic liver with moderate ascites.  - GI consulted, appreciate recommendations  - Continue Rifaximin 550 mg PO BID - PT/OT following - Continue delirium precautions - AM CBC/CMP

## 2023-12-06 NOTE — Assessment & Plan Note (Addendum)
 Stable, no changes

## 2023-12-06 NOTE — Plan of Care (Signed)
  Problem: Education: Goal: Knowledge of General Education information will improve Description: Including pain rating scale, medication(s)/side effects and non-pharmacologic comfort measures Outcome: Progressing   Problem: Health Behavior/Discharge Planning: Goal: Ability to manage health-related needs will improve Outcome: Progressing   Problem: Clinical Measurements: Goal: Will remain free from infection Outcome: Progressing Goal: Respiratory complications will improve Outcome: Progressing Goal: Cardiovascular complication will be avoided Outcome: Progressing   Problem: Coping: Goal: Level of anxiety will decrease Outcome: Progressing

## 2023-12-06 NOTE — Assessment & Plan Note (Addendum)
 No new concerns regarding lesions today.  - treating cirrhosis as above

## 2023-12-06 NOTE — Assessment & Plan Note (Addendum)
 Continue current medication with expectation of full effect in 4 to 6 weeks, recommend outpatient CBT. - Continue Escitalopram 5mg  daily

## 2023-12-06 NOTE — Progress Notes (Addendum)
     Daily Progress Note Intern Pager: [phone]  Patient name: ROCKY GLADDEN Medical record number: [id number] Date of birth: 11/05/1965 Age: 58 y.o. Gender: male  Primary Care Provider: Patient, No Pcp Per Consultants: GI, palliative, Nephrology (signed off0  Code Status: DNR-Limited   Pt Overview and Major Events to Date:  9/29: Admitted  Medical Decision Making:  KEYONTE COOKSTON is a 58 year old male with a PMH of MASH, varices, gastric ulcer, portal hypertension, HLD, essential HTN, GAD, tobacco use, OSA, and nephrolithiasis who presented with AMS now resolved 2/2 decompensated liver cirrhosis with hepatorenal syndrome.  SNF pending placement.  Assessment & Plan Cirrhosis of liver with ascites (HCC) Hepatorenal syndrome (HCC) Esophageal varices in cirrhosis (HCC) Afebrile, hemodynamically stable. Awake, alert, and responding appropriately. WBC within normal limits today. Kidney US  showed no hydronephrosis, kidneys within normal limits, cirrhotic liver with moderate ascites.  - GI consulted, appreciate recommendations  - Continue Rifaximin 550 mg PO BID - PT/OT following - Continue delirium precautions - AM CBC/CMP AKI (acute kidney injury) Creatinine 1.80. Continue to monitor. MAPs 68-72. - Nephrology - continue midodrine at 30 mg TID  - continue octreotide 200 mcg TID - strict I&Os, daily weights - AM CMP Black stool Stable, no changes. Reactive perforating collagenosis No new concerns regarding lesions today.  - treating cirrhosis as above Depression Continue current medication with expectation of full effect in 4 to 6 weeks, recommend outpatient CBT. - Continue Escitalopram 5mg  daily  FEN/GI: Renal diet with fluid restriction 1200 mL  PPx: Lovenox  Dispo: SNF pending clinical improvement/acceptance.   Subjective:  Patient states he is doing all right this morning.  He denies any pain at this time.  He states he peed twice this morning.  Patient is  continuing to have bowel movements, however, has not looked at the color.  He denies any other concerns at this time.  Objective: Temp:  [97.6 F (36.4 C)-98.7 F (37.1 C)] 98.7 F (37.1 C) (10/12 0738) Pulse Rate:  [59-66] 64 (10/12 0738) Resp:  [16-18] 16 (10/12 0738) BP: (83-101)/(55-59) 92/58 (10/12 0738) SpO2:  [96 %-99 %] 96 % (10/12 0337) Weight:  [76.7 kg] 76.7 kg (10/12 0500) Physical Exam: General: Chronically ill-appearing, NAD Cardiovascular: RRR, no M/R/G Respiratory: CTAB, normal work of breathing on room air Abdomen: Soft, nontender, nondistended Extremities: No pitting edema bilaterally  Laboratory: Most recent CBC Lab Results  Component Value Date   WBC 7.8 12/05/2023   HGB 10.1 (L) 12/05/2023   HCT 29.7 (L) 12/05/2023   MCV 97.4 12/05/2023   PLT 98 (L) 12/05/2023   Most recent BMP    Latest Ref Rng & Units 12/06/2023    5:46 AM  BMP  Glucose 70 - 99 mg/dL 866   BUN 6 - 20 mg/dL 28   Creatinine 9.38 - 1.24 mg/dL 8.19   Sodium 864 - 854 mmol/L 136   Potassium 3.5 - 5.1 mmol/L 4.0   Chloride 98 - 111 mmol/L 103   CO2 22 - 32 mmol/L 20   Calcium 8.9 - 10.3 mg/dL 8.0    Imaging/Diagnostic Tests: No new imaging.   Lennie Raguel MATSU, DO 12/06/2023, 7:44 AM  PGY-1, [facility] FPTS Intern pager: [phone], text pages welcome Secure chat group [facility] Teaching Service

## 2023-12-06 NOTE — Plan of Care (Signed)
  Problem: Health Behavior/Discharge Planning: Goal: Ability to manage health-related needs will improve Outcome: Progressing   Problem: Activity: Goal: Risk for activity intolerance will decrease Outcome: Not Progressing   Problem: Nutrition: Goal: Adequate nutrition will be maintained Outcome: Not Progressing Note: PATIENT VERBALIZED WITH NO APPETITE. ONLY DRANK ENSURES AND WATER THIS SHIFT.

## 2023-12-06 NOTE — Plan of Care (Signed)
  Problem: Education: Goal: Knowledge of General Education information will improve Description: Including pain rating scale, medication(s)/side effects and non-pharmacologic comfort measures Outcome: Progressing   Problem: Health Behavior/Discharge Planning: Goal: Ability to manage health-related needs will improve 12/06/2023 0701 by Curly Venetia RAMAN, RN Outcome: Progressing 12/06/2023 0700 by Curly Venetia RAMAN, RN Outcome: Progressing   Problem: Clinical Measurements: Goal: Ability to maintain clinical measurements within normal limits will improve Outcome: Progressing Goal: Will remain free from infection 12/06/2023 0701 by Curly Venetia RAMAN, RN Outcome: Progressing 12/06/2023 0700 by Curly Venetia RAMAN, RN Outcome: Progressing Goal: Respiratory complications will improve Outcome: Progressing Goal: Cardiovascular complication will be avoided Outcome: Progressing   Problem: Coping: Goal: Level of anxiety will decrease Outcome: Progressing

## 2023-12-07 DIAGNOSIS — N179 Acute kidney failure, unspecified: Secondary | ICD-10-CM | POA: Diagnosis not present

## 2023-12-07 DIAGNOSIS — K7460 Unspecified cirrhosis of liver: Secondary | ICD-10-CM | POA: Diagnosis not present

## 2023-12-07 DIAGNOSIS — Z515 Encounter for palliative care: Secondary | ICD-10-CM | POA: Diagnosis not present

## 2023-12-07 DIAGNOSIS — K7682 Hepatic encephalopathy: Secondary | ICD-10-CM | POA: Diagnosis not present

## 2023-12-07 LAB — BASIC METABOLIC PANEL WITH GFR
Anion gap: 9 (ref 5–15)
BUN: 31 mg/dL — ABNORMAL HIGH (ref 6–20)
CO2: 22 mmol/L (ref 22–32)
Calcium: 8.1 mg/dL — ABNORMAL LOW (ref 8.9–10.3)
Chloride: 100 mmol/L (ref 98–111)
Creatinine, Ser: 2.25 mg/dL — ABNORMAL HIGH (ref 0.61–1.24)
GFR, Estimated: 33 mL/min — ABNORMAL LOW (ref >60)
Glucose, Bld: 117 mg/dL — ABNORMAL HIGH (ref 70–99)
Potassium: 4.7 mmol/L (ref 3.5–5.1)
Sodium: 131 mmol/L — ABNORMAL LOW (ref 135–145)

## 2023-12-07 LAB — CBC
HCT: 31.9 % — ABNORMAL LOW (ref 39.0–52.0)
Hemoglobin: 11 g/dL — ABNORMAL LOW (ref 13.0–17.0)
MCH: 33.4 pg (ref 26.0–34.0)
MCHC: 34.5 g/dL (ref 30.0–36.0)
MCV: 97 fL (ref 80.0–100.0)
Platelets: 122 K/uL — ABNORMAL LOW (ref 150–400)
RBC: 3.29 MIL/uL — ABNORMAL LOW (ref 4.22–5.81)
RDW: 18.6 % — ABNORMAL HIGH (ref 11.5–15.5)
WBC: 10 K/uL (ref 4.0–10.5)
nRBC: 0 % (ref 0.0–0.2)

## 2023-12-07 LAB — SODIUM, URINE, RANDOM: Sodium, Ur: <30 mmol/L

## 2023-12-07 LAB — CREATININE, URINE, RANDOM: Creatinine, Urine: 191 mg/dL

## 2023-12-07 NOTE — Progress Notes (Signed)
     Daily Progress Note Intern Pager: [phone]  Patient name: James Downs Medical record number: [id number] Date of birth: [birth year] Age: 58 y.o. Gender: male  Primary Care Provider: Patient, No Pcp Per Consultants: GI, palliative, nephrology (signed off) Code Status: DNR-Limited  Pt Overview and Major Events to Date:  9/29: Admitted, paracentesis 7.9 L removed 10/1: Paracentesis 8.3 L removed 10/10: Paracentesis 4.8 L removed  Medical Decision Making:  James Downs is a 58 year old male with PMH of MASH, varices, gastric ulcer, portal hypertension, HLD, essential hypertension, GAD, tobacco use, and OSA who presented with AMS, now resolved, 2/2 decompensated liver cirrhosis with hepatorenal syndrome.  Creatinine bumped today from 1.80 to 2.25. Assessment & Plan Cirrhosis of liver with ascites (HCC) Hepatorenal syndrome (HCC) Esophageal varices in cirrhosis (HCC) Afebrile, hemodynamically stable. Awake, alert, and responding appropriately.  - GI consulted, appreciate recommendations  - Continue Rifaximin 550 mg PO BID - PT/OT following - Continue delirium precautions - AM CBC/CMP AKI (acute kidney injury) Creatinine 2.25. Continue to monitor. MAPs 72-77. - continue midodrine at 30 mg TID  - continue octreotide 200 mcg TID - strict I&Os, daily weights - AM CMP Black stool Stable, no changes. Reactive perforating collagenosis No new concerns regarding lesions today.  - treating cirrhosis as above Depression Continue current medication with expectation of full effect in 4 to 6 weeks, recommend outpatient CBT. - Continue Escitalopram 5mg  daily   FEN/GI: Renal diet with fluid restriction 1200 mL  PPx: Lovenox Dispo: Pending clinical improvement.   Subjective:  Patient states he feels puny today.  He states he feels weak and tired.  He endorses continued urine output.  However, he denies any recent bowel movements.  He endorses slight abdominal pain this  morning.  Objective: Temp:  [98 F (36.7 C)-98.1 F (36.7 C)] 98.1 F (36.7 C) (10/12 2300) Pulse Rate:  [64-70] 64 (10/12 2300) Resp:  [17-18] 17 (10/12 1550) BP: (99-101)/(62-65) 100/64 (10/12 2300) SpO2:  [97 %-98 %] 97 % (10/12 2300) Physical Exam: General: Ill-appearing male, NAD Cardiovascular: RRR, no M/R/G Respiratory: CTAB, normal work of breathing on room air Abdomen: Soft, nondistended, nontender to palpation Extremities: No edema, many petechiae over bilateral lower extremities (per patient have been there for multiple months)  Laboratory: Most recent CBC Lab Results  Component Value Date   WBC 7.8 12/05/2023   HGB 10.1 (L) 12/05/2023   HCT 29.7 (L) 12/05/2023   MCV 97.4 12/05/2023   PLT 98 (L) 12/05/2023   Most recent BMP    Latest Ref Rng & Units 12/07/2023    1:28 AM  BMP  Glucose 70 - 99 mg/dL 882   BUN 6 - 20 mg/dL 31   Creatinine 9.38 - 1.24 mg/dL 7.74   Sodium 864 - 854 mmol/L 131   Potassium 3.5 - 5.1 mmol/L 4.7   Chloride 98 - 111 mmol/L 100   CO2 22 - 32 mmol/L 22   Calcium 8.9 - 10.3 mg/dL 8.1    Imaging/Diagnostic Tests: No new imaging.   Lennie Raguel MATSU, DO 12/07/2023, 8:07 AM  PGY-1, [facility] FPTS Intern pager: [phone], text pages welcome Secure chat group [facility] Teaching Service

## 2023-12-07 NOTE — Assessment & Plan Note (Signed)
 Creatinine 2.25. Continue to monitor. MAPs 72-77. - continue midodrine at 30 mg TID  - continue octreotide 200 mcg TID - strict I&Os, daily weights - AM CMP

## 2023-12-07 NOTE — Progress Notes (Signed)
 Subjective: Patient seen and examined. Chart reviewed. Patient denies any active GI complaints at this time.  He has been passing flatus but has not had a BM today his last BM was yesterday. He denies having any abdominal pain or nausea.  He does not want to drink start the lactulose  as that makes him very nauseated.  His creatinine has increased from 1.80-2.25 today.   Objective: Vital signs in last 24 hours: Temp:  [97.7 F (36.5 C)-98.3 F (36.8 C)] 98.3 F (36.8 C) (10/13 1128) Pulse Rate:  [62-70] 62 (10/13 1128) Resp:  [17-18] 18 (10/13 1128) BP: (95-101)/(59-67) 95/59 (10/13 1128) SpO2:  [97 %-98 %] 97 % (10/13 1128) Last BM Date : 12/04/23  Intake/Output from previous day: 10/12 0701 - 10/13 0700 In: 637 [P.O.:637] Out: 750 [Urine:750] Intake/Output this shift: Total I/O In: -  Out: 160 [Urine:160]  General appearance: alert, cooperative, fatigued, icteric, and no distress Resp: clear to auscultation bilaterally Cardio: regular rate and rhythm, S1, S2 normal, no murmur, click, rub or gallop GI: soft, non-tender; bowel sounds normal; no masses,  no organomegaly Extremities: extremities normal, atraumatic, no cyanosis or edema. multiple papular lesions on lower extremities  Lab Results: Recent Labs    12/05/23 0323  WBC 7.8  HGB 10.1*  HCT 29.7*  PLT 98*   BMET Recent Labs    12/05/23 0323 12/06/23 0546 12/07/23 0128  NA 134* 136 131*  K 4.2 4.0 4.7  CL 104 103 100  CO2 20* 20* 22  GLUCOSE 122* 133* 117*  BUN 26* 28* 31*  CREATININE 1.79* 1.80* 2.25*  CALCIUM 8.4* 8.0* 8.1*   LFT Recent Labs    12/05/23 0323 12/06/23 0546  PROT 5.6*  --   ALBUMIN  3.0* 2.7*  AST 29  --   ALT 15  --   ALKPHOS 29*  --   BILITOT 2.4*  --    Medications: I have reviewed the patient's current medications. Prior to Admission:  Medications Prior to Admission  Medication Sig Dispense Refill Last Dose/Taking   aspirin EC 81 MG tablet Take 81 mg by mouth daily. Swallow  whole.   11/22/2023 Morning   carvedilol  (COREG ) 6.25 MG tablet Take 6.25 mg by mouth 2 (two) times daily. (Patient not taking: Reported on 11/23/2023)   Not Taking   furosemide  (LASIX ) 40 MG tablet Take 1 tablet (40 mg total) by mouth daily. (Patient not taking: Reported on 11/23/2023) 30 tablet 0 Not Taking   lactulose  (CHRONULAC ) 10 GM/15ML solution Take 30 mLs (20 g total) by mouth 2 (two) times daily. (Patient not taking: Reported on 11/23/2023) 1800 mL 0 Not Taking   spironolactone  (ALDACTONE ) 100 MG tablet Take 1 tablet (100 mg total) by mouth daily. 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 2) Acute kidney injury  superimposed on chronic kidney disease-risk for hepatorenal syndrome-worsening renal function with creatinine of 2.2 we need to get nephrology involved again.SABRA 3) Rectal bleeding with portal colopathy noted on CT scan-resolved  4) Extensive nephrolithiasis without any evidence of obstructive uropathy 5) Skin rash over lower legs. 6) Contracted gallbladder with cholelithiasis. 7) Constipation-if constipation is a problem, Colace and MiraLAX can be tried. 7) Limited code; patient does not desire intubation if need arises in the future.    LOS: 14 days   Renaye Sous 12/07/2023, 2:25 PM

## 2023-12-07 NOTE — Assessment & Plan Note (Addendum)
 Afebrile, hemodynamically stable. Awake, alert, and responding appropriately.  - GI consulted, appreciate recommendations  - Continue Rifaximin 550 mg PO BID - PT/OT following - Continue delirium precautions - AM CBC/CMP

## 2023-12-07 NOTE — Plan of Care (Signed)

## 2023-12-07 NOTE — Assessment & Plan Note (Signed)
 Stable, no changes

## 2023-12-07 NOTE — Progress Notes (Signed)
 PT Cancellation Note  Patient Details Name: James Downs MRN: [id number] DOB: May 29, 1965   Cancelled Treatment:    Reason Eval/Treat Not Completed: Other (comment) Pt declining due to fatigue; states he wants to work with PT tomorrow.  Aleck Daring, PT, DPT Acute Rehabilitation Services Office [phone]    Aleck ONEIDA Daring 12/07/2023, 4:26 PM

## 2023-12-07 NOTE — Plan of Care (Signed)
  Problem: Education: Goal: Knowledge of General Education information will improve Description: Including pain rating scale, medication(s)/side effects and non-pharmacologic comfort measures Outcome: Progressing   Problem: Clinical Measurements: Goal: Will remain free from infection Outcome: Progressing   Problem: Coping: Goal: Level of anxiety will decrease Outcome: Progressing   Problem: Health Behavior/Discharge Planning: Goal: Ability to manage health-related needs will improve Outcome: Not Progressing

## 2023-12-07 NOTE — Assessment & Plan Note (Signed)
 No new concerns regarding lesions today.  - treating cirrhosis as above

## 2023-12-07 NOTE — Progress Notes (Signed)
 Palliative Medicine Inpatient Follow Up Note   HPI:  58 y.o. male  with past medical history of non-alcoholic cirrhosis, esophageal varices, gastric ulcer, portal HTN, HLD, nephrolithiasis  admitted on 11/23/2023 with 2-3 days of weakness, abdominal pain, and diarrhea.    11/23/2023: Paracentesis done, 7.9 L  of ascitic fluid removed 11/25/2023: Paracentesis done, 8.3 L of ascitic fluid removed  12/04/2023 Paracentesis done, 4.8 L of ascitic fluid removed   PMT has been consulted to assist with goals of care conversation. Patient/Family face treatment option decisions, advanced directive decisions and anticipatory care needs.    Family face treatment option decision, advance directive decisions and anticipatory care needs.   Today's Discussion 12/07/2023  *Please note that this is a verbal dictation therefore any spelling or grammatical errors are due to the Dragon Medical One system interpretation.  Chart reviewed inclusive of vital signs, progress notes, laboratory results, and diagnostic images.   I visited the patient at bedside today. He reports feeling well. He mentioned that things are about the same. He reported good sleep last night. Denies pain and discomfort. Denies abdominal fullness.   The overall care plan remains unchanged. We will continue to allow time for clinical outcomes to evolve. Disposition planning is ongoing, with efforts focused on identifying and securing a SNF placement once the patient is medically stable for discharge.  I encouraged the patient to maintain open communication with his medical team and family regarding his preferences, goals, and values, especially in the context of long-term planning.  Created space and opportunity for patient to explore thoughts feelings and fears regarding current medical situation.  Patient and her family face treatment option decisions, advanced directive decisions and anticipatory care needs.   Questions and  concerns addressed   Palliative Support Provided.   Objective Assessment: Vital Signs Vitals:   12/07/23 1128 12/07/23 1608  BP: (!) 95/59 94/63  Pulse: 62 60  Resp: 18 16  Temp: 98.3 F (36.8 C) 99.2 F (37.3 C)  SpO2: 97% 99%    Intake/Output Summary (Last 24 hours) at 12/07/2023 1616 Last data filed at 12/07/2023 1400 Gross per 24 hour  Intake --  Output 760 ml  Net -760 ml   Last Weight  Most recent update: 12/06/2023  7:06 AM    Weight  76.7 kg (169 lb 0.8 oz)             Physical Exam: General: Alert, chronically-ill appearing, pale looking HEENT: No sign of trauma, EOM grossly intact. Cardiovascular: RRR, HR 61 BPM Pulmonary: Normal WOB. CTAB with no w/c/r present. Abdomen: Moderately distended, fluid wave is present. Extremities: Warm, 2+ edema in bilateral LE. Neurologic: No focal deficits, alert and oriented to person place and time. Skin: Warm and dry.  SUMMARY OF RECOMMENDATIONS    Code Status: Maintain DNR/DNI status Goal of care currently is medical stabilization, allowing time for outcomes Discussed the importance of continued conversation with family and the medical providers regarding overall plan of care and treatment options, ensuring decisions are within the context of the patient's values and GOCs.  Plan for SNF at discharge with outpatient palliative.  Continue to provide psycho-social and emotional support to patient and family. Palliative medicine team will continue to follow.   Symptom Management: Per Primary team Paracentesis performed 12/04/2023, drained 4.8L of ascitic fluid.  Palliative medicine is available to assist as needed.    Time Spent: 35 minutes  Detailed review of medical records (labs, imaging, vital signs), medically appropriate exam, discussed  with treatment team, counseling and education to patient, family, & staff, documenting clinical information, coordination of care.   ____________________________________________________________________ Kathlyne Bolder NP-C [place] [facility] Palliative Medicine Team Team Cell Phone: [phone] Please utilize secure chat with additional questions, if there is no response within 30 minutes please call the above phone number  Palliative Medicine Team providers are available by phone from 7am to 7pm daily and can be reached through the team cell phone.  Should this patient require assistance outside of these hours, please call the patient's attending physician.

## 2023-12-07 NOTE — Assessment & Plan Note (Signed)
 Continue current medication with expectation of full effect in 4 to 6 weeks, recommend outpatient CBT. - Continue Escitalopram 5mg  daily

## 2023-12-08 DIAGNOSIS — K7460 Unspecified cirrhosis of liver: Secondary | ICD-10-CM | POA: Diagnosis not present

## 2023-12-08 DIAGNOSIS — E440 Moderate protein-calorie malnutrition: Secondary | ICD-10-CM | POA: Insufficient documentation

## 2023-12-08 DIAGNOSIS — R188 Other ascites: Secondary | ICD-10-CM | POA: Diagnosis not present

## 2023-12-08 DIAGNOSIS — N179 Acute kidney failure, unspecified: Secondary | ICD-10-CM | POA: Diagnosis not present

## 2023-12-08 DIAGNOSIS — Z789 Other specified health status: Secondary | ICD-10-CM

## 2023-12-08 DIAGNOSIS — K7682 Hepatic encephalopathy: Secondary | ICD-10-CM | POA: Diagnosis not present

## 2023-12-08 LAB — CBC
HCT: 32.2 % — ABNORMAL LOW (ref 39.0–52.0)
Hemoglobin: 10.9 g/dL — ABNORMAL LOW (ref 13.0–17.0)
MCH: 33.3 pg (ref 26.0–34.0)
MCHC: 33.9 g/dL (ref 30.0–36.0)
MCV: 98.5 fL (ref 80.0–100.0)
Platelets: 123 K/uL — ABNORMAL LOW (ref 150–400)
RBC: 3.27 MIL/uL — ABNORMAL LOW (ref 4.22–5.81)
RDW: 19 % — ABNORMAL HIGH (ref 11.5–15.5)
WBC: 9.4 K/uL (ref 4.0–10.5)
nRBC: 0 % (ref 0.0–0.2)

## 2023-12-08 LAB — COMPREHENSIVE METABOLIC PANEL WITH GFR
ALT: 17 U/L (ref 0–44)
AST: 32 U/L (ref 15–41)
Albumin: 2.7 g/dL — ABNORMAL LOW (ref 3.5–5.0)
Alkaline Phosphatase: 37 U/L — ABNORMAL LOW (ref 38–126)
Anion gap: 11 (ref 5–15)
BUN: 32 mg/dL — ABNORMAL HIGH (ref 6–20)
CO2: 19 mmol/L — ABNORMAL LOW (ref 22–32)
Calcium: 8 mg/dL — ABNORMAL LOW (ref 8.9–10.3)
Chloride: 102 mmol/L (ref 98–111)
Creatinine, Ser: 2.09 mg/dL — ABNORMAL HIGH (ref 0.61–1.24)
GFR, Estimated: 36 mL/min — ABNORMAL LOW (ref >60)
Glucose, Bld: 123 mg/dL — ABNORMAL HIGH (ref 70–99)
Potassium: 4.6 mmol/L (ref 3.5–5.1)
Sodium: 132 mmol/L — ABNORMAL LOW (ref 135–145)
Total Bilirubin: 2.1 mg/dL — ABNORMAL HIGH (ref 0.0–1.2)
Total Protein: 5.7 g/dL — ABNORMAL LOW (ref 6.5–8.1)

## 2023-12-08 LAB — PROTIME-INR
INR: 1.6 — ABNORMAL HIGH (ref 0.8–1.2)
Prothrombin Time: 20.1 s — ABNORMAL HIGH (ref 11.4–15.2)

## 2023-12-08 MED ORDER — LACTULOSE 10 GM/15ML PO SOLN
10.0000 g | Freq: Every day | ORAL | Status: DC
  Administered 2023-12-08 – 2023-12-10 (×3): 10 g via ORAL
  Filled 2023-12-08 (×3): qty 30

## 2023-12-08 NOTE — Assessment & Plan Note (Deleted)
 No new concerns regarding lesions today.  - treating cirrhosis as above

## 2023-12-08 NOTE — Plan of Care (Signed)
 Worked with therapy today. Decreases appetite. [facility] ordered today.    Problem: Education: Goal: Knowledge of General Education information will improve Description: Including pain rating scale, medication(s)/side effects and non-pharmacologic comfort measures Outcome: Progressing   Problem: Nutrition: Goal: Adequate nutrition will be maintained Outcome: Progressing   Problem: Safety: Goal: Ability to remain free from injury will improve Outcome: Progressing   Problem: Skin Integrity: Goal: Risk for impaired skin integrity will decrease Outcome: Progressing

## 2023-12-08 NOTE — Plan of Care (Signed)
  Problem: Clinical Measurements: Goal: Ability to maintain clinical measurements within normal limits will improve Outcome: Progressing Goal: Will remain free from infection Outcome: Progressing Goal: Diagnostic test results will improve Outcome: Progressing Goal: Respiratory complications will improve Outcome: Progressing Goal: Cardiovascular complication will be avoided Outcome: Progressing   Problem: Activity: Goal: Risk for activity intolerance will decrease Outcome: Progressing   Problem: Elimination: Goal: Will not experience complications related to bowel motility Outcome: Progressing Goal: Will not experience complications related to urinary retention Outcome: Progressing   Problem: Pain Managment: Goal: General experience of comfort will improve and/or be controlled Outcome: Progressing   Problem: Safety: Goal: Ability to remain free from injury will improve Outcome: Progressing   Problem: Skin Integrity: Goal: Risk for impaired skin integrity will decrease Outcome: Progressing   Problem: Activity: Goal: Risk for activity intolerance will decrease Outcome: Progressing

## 2023-12-08 NOTE — Assessment & Plan Note (Deleted)
 Continue current medication with expectation of full effect in 4 to 6 weeks, recommend outpatient CBT. - Continue Escitalopram 5mg  daily

## 2023-12-08 NOTE — Assessment & Plan Note (Addendum)
 Creatinine improving today 2.25 --> 2.09. Continue to monitor. MAPs 60-72, decreased from yesterday. Spoke with Dr. Rayburn today, who stated there is nothing he would change on the patient's current regimen.  - continue midodrine at 30 mg TID  - continue octreotide 200 mcg TID - strict I&Os, daily weights - AM CMP

## 2023-12-08 NOTE — Assessment & Plan Note (Deleted)
 Stable, no changes

## 2023-12-08 NOTE — Assessment & Plan Note (Signed)
 Depression - continue Escitalopram 5 mg

## 2023-12-08 NOTE — Progress Notes (Addendum)
 Physical Therapy Treatment Patient Details Name: James Downs MRN: [id number] DOB: 1965-05-23 Today's Date: 12/08/2023   History of Present Illness 58 year old male presenting 9/29 with weakness, abdominal pain, diarrhea, and several days of altered mental status. Dx Hepatic encephalopathy, Decompensated NASH cirrhosis, Ascites, AKI with CKD. PMHof non-alcoholic cirrhosis, HTN, HLD, and nephrolithiasis,    PT Comments  Pt received in supine and agreeable to session. Pt able to sit to EOB and stand with up to light min A. Pt able to take a few steps along EOB, but did not progress ambulation due to low BP. Pt able to complete BLE exercises and demonstrates slightly improved activity tolerance, but continues to be below baseline. Pt requests to sit in recliner, but RN defers this session due to low BP, so pt left with bed in chair position. Pt instructed in BLE exercises to complete independently for ROM and strengthening. Pt continues to benefit from PT services to progress toward functional mobility goals.   BP supine:88/66 Sitting EOB: 87/51 Standing: 78/53 Supine at end of session: 101/67   If plan is discharge home, recommend the following: A lot of help with walking and/or transfers;A lot of help with bathing/dressing/bathroom;Assistance with cooking/housework;Assist for transportation;Help with stairs or ramp for entrance   Can travel by private vehicle     Yes  Equipment Recommendations  Wheelchair (measurements PT);BSC/3in1    Recommendations for Other Services       Precautions / Restrictions Precautions Precautions: Fall Recall of Precautions/Restrictions: Impaired Precaution/Restrictions Comments: bowel incontinence, low BP Restrictions Weight Bearing Restrictions Per Provider Order: No     Mobility  Bed Mobility Overal bed mobility: Needs Assistance Bed Mobility: Supine to Sit, Sit to Supine     Supine to sit: Contact guard, HOB elevated, Used rails Sit to  supine: Contact guard assist   General bed mobility comments: increased time and effort. cues for technique, but no physical assist    Transfers Overall transfer level: Needs assistance Equipment used: Rolling walker (2 wheels) Transfers: Sit to/from Stand, Bed to chair/wheelchair/BSC Sit to Stand: Min assist           General transfer comment: STS from EOB with light min A and cues for hand placement. Pt able to take a few lateral steps at EOB with CGA for safety    Ambulation/Gait               General Gait Details: deferred due to low BP   Stairs             Wheelchair Mobility     Tilt Bed    Modified Rankin (Stroke Patients Only)       Balance Overall balance assessment: Needs assistance Sitting-balance support: No upper extremity supported, Feet supported, Bilateral upper extremity supported Sitting balance-Leahy Scale: Good Sitting balance - Comments: sitting EOB   Standing balance support: Bilateral upper extremity supported, During functional activity, Reliant on assistive device for balance Standing balance-Leahy Scale: Poor Standing balance comment: Reliant on RW support                            Communication Communication Communication: No apparent difficulties  Cognition Arousal: Alert Behavior During Therapy: WFL for tasks assessed/performed   PT - Cognitive impairments: Problem solving                         Following commands: Impaired Following commands impaired: Follows multi-step commands  with increased time    Cueing Cueing Techniques: Verbal cues, Tactile cues  Exercises General Exercises - Lower Extremity Hip Flexion/Marching: AROM, Seated, Both, 5 reps    General Comments        Pertinent Vitals/Pain Pain Assessment Pain Assessment: No/denies pain     PT Goals (current goals can now be found in the care plan section) Acute Rehab PT Goals Patient Stated Goal: agreeable to short term  rehab PT Goal Formulation: With patient Time For Goal Achievement: 12/08/23 Progress towards PT goals: Progressing toward goals    Frequency    Min 2X/week       AM-PAC PT 6 Clicks Mobility   Outcome Measure  Help needed turning from your back to your side while in a flat bed without using bedrails?: A Little Help needed moving from lying on your back to sitting on the side of a flat bed without using bedrails?: A Little Help needed moving to and from a bed to a chair (including a wheelchair)?: A Little Help needed standing up from a chair using your arms (e.g., wheelchair or bedside chair)?: A Little Help needed to walk in hospital room?: Total Help needed climbing 3-5 steps with a railing? : Total 6 Click Score: 14    End of Session Equipment Utilized During Treatment: Gait belt Activity Tolerance: Patient tolerated treatment well;Other (comment) (limited by low BP) Patient left: in bed;with call bell/phone within reach;with bed alarm set (in chair position) Nurse Communication: Mobility status PT Visit Diagnosis: Unsteadiness on feet (R26.81);Other abnormalities of gait and mobility (R26.89);Muscle weakness (generalized) (M62.81);Difficulty in walking, not elsewhere classified (R26.2);Other symptoms and signs involving the nervous system (R29.898)     Time: 8577-8554 PT Time Calculation (min) (ACUTE ONLY): 23 min  Charges:    $Therapeutic Activity: 23-37 mins PT General Charges $$ ACUTE PT VISIT: 1 Visit                     Darryle George, PTA Acute Rehabilitation Services Secure Chat Preferred  [phone]    Darryle George 12/08/2023, 2:59 PM

## 2023-12-08 NOTE — Assessment & Plan Note (Addendum)
 Afebrile, hemodynamically stable. Awake, alert, and responding appropriately.  - Added back lactulose  10 g daily  - GI consulted, appreciate recommendations  - Continue Rifaximin 550 mg PO BID - PT/OT following - Continue delirium precautions - AM CBC/CMP

## 2023-12-08 NOTE — Progress Notes (Signed)
 Nutrition Follow Up  DOCUMENTATION CODES:   Non-severe (moderate) malnutrition in context of chronic illness (decompensated liver cirrhosis with hepatorenal syndrome)  INTERVENTION:  Liberalized diet to 2g Sodium to provide increased options and promote adequate PO intake that meets calorie and protein needs  Ensure Plus High Protein po TID, each supplement provides 350 kcal and 20 grams of protein.   Multivitamin with minerals daily.  NUTRITION DIAGNOSIS:   Moderate Malnutrition related to chronic illness (decompensated liver cirrhosis with hepatorenal syndrome) as evidenced by moderate muscle depletion, moderate fat depletion, percent weight loss. New diagnosis   GOAL:   Patient will meet greater than or equal to 90% of their needs Currently, unmet  MONITOR:   PO intake, Supplement acceptance, Weight trends  REASON FOR ASSESSMENT:   Consult Assessment of nutrition requirement/status  ASSESSMENT:   58 y.o. male with a PMH of MASH cirrhosis, varices, gastric ulcer, portal hypertension, HLD, essential HTN, GAD, tobacco use, OSA, and nephrolithiasis who presented with AMS, now resolved, 2/2 decompensated liver cirrhosis with hepatorenal syndrome.  Spoke with pt who was resting in bed at time of assessment. Pt reports continued poor appetite and states he does not feel like eating. Observed a few bites of scrambled eggs taken from tray, but otherwise pt's diet summary documentation shows 0% intake for last 5 meals. Pt reports he has been drinking Ensure shakes and feels like they make him full. Discussed pt's high calorie and protein needs given his current health status and encouraged him to drink Ensure on top of eating at each meal. Pt agreeable to try and was happy to hear he will receive more options on 2g sodium diet instead of being on renal diet. Will continue to monitor intake and adjust supplements as needed.  Pt with reported significant wt loss in last 6 months,  approximately 22% wt loss which is clinically significant. Given pt's medical hx, pt likely struggles with fluid shifts which may be contributing to wt loss, but physical exam also shows moderate fat and moderate muscle depletion, indicative of malnutrition. Pt reports eating very little PTA due to poor appetite. States he would only eat 1 meal per day at dinner time which consisted of meat, vegetable, and side but otherwise pt would not drink ONS or snack on anything throughout the day.   Of note, plan for patient to discharge with outpatient palliative services.   Admit weight: 180# Current weight: 164# I&O's: -10.7L since admit, pt still presents with mild edema, suspect wt is not reflecting pt's true dry wt  Medications reviewed and include: Protonix, lactulose , midodrine, MVI w/ minerals  Labs reviewed:  Na 132 BUN 32/Cr 2.09   NUTRITION - FOCUSED PHYSICAL EXAM:  Flowsheet Row Most Recent Value  Orbital Region Mild depletion  Upper Arm Region Moderate depletion  Thoracic and Lumbar Region No depletion  Buccal Region Moderate depletion  Temple Region Moderate depletion  Clavicle Bone Region Moderate depletion  Clavicle and Acromion Bone Region Moderate depletion  Scapular Bone Region Moderate depletion  Dorsal Hand Moderate depletion  Patellar Region Moderate depletion  Anterior Thigh Region Moderate depletion  Posterior Calf Region Unable to assess  Edema (RD Assessment) Mild  Hair Reviewed  Eyes Reviewed  Mouth Reviewed  Skin Reviewed  Nails Reviewed     Diet Order:   Diet Order             Diet 2 gram sodium Room service appropriate? Yes; Fluid consistency: Thin; Fluid restriction: 1200 mL Fluid  Diet  effective now                   EDUCATION NEEDS:  Education needs have been addressed  Skin:  Skin Assessment: Reviewed RN Assessment  Last BM:  10/12  Height:  Ht Readings from Last 1 Encounters:  11/23/23 5' 7 (1.702 m)   Weight:  Wt Readings  from Last 1 Encounters:  12/08/23 74.7 kg   Ideal Body Weight:  67.27 kg  BMI:  Body mass index is 25.8 kg/m.  Estimated Nutritional Needs:  Kcal:  2100-2300 kcals Protein:  100-115 grams Fluid:  >/= 2.1L or per MD    Josette Glance, MS, RDN, LDN Clinical Dietitian I Please reach out via secure chat

## 2023-12-08 NOTE — Progress Notes (Addendum)
     Daily Progress Note Intern Pager: [phone]  Patient name: James Downs Medical record number: [id number] Date of birth: 10-10-1965 Age: 58 y.o. Gender: male  Primary Care Provider: Patient, No Pcp Per Consultants: GI, palliative, nephrology  Code Status: DNR-Limited   Pt Overview and Major Events to Date:  9/29: Admitted, paracentesis 7.9 L removed 10/1: Paracentesis 8.3 L removed 10/10: Paracentesis 4.8 L removed  Medical Decision Making:  James Downs is a 58 y.o. male with a PMH of MASH, varicies, gastric ulcer, portal HTN, HLD, essential HTN, GAD, tobacco use, and OSA with decompensated liver cirrhosis and hepatorenal syndrome. Currently with concern for possible component of CKD.  Assessment & Plan Cirrhosis of liver with ascites (HCC) Hepatorenal syndrome (HCC) Esophageal varices in cirrhosis (HCC) Afebrile, hemodynamically stable. Awake, alert, and responding appropriately.  - Added back lactulose  10 g daily  - GI consulted, appreciate recommendations  - Continue Rifaximin 550 mg PO BID - PT/OT following - Continue delirium precautions - AM CBC/CMP AKI (acute kidney injury) Creatinine improving today 2.25 --> 2.09. Continue to monitor. MAPs 60-72, decreased from yesterday. Spoke with Dr. Rayburn today, who stated there is nothing he would change on the patient's current regimen.  - continue midodrine at 30 mg TID  - continue octreotide 200 mcg TID - strict I&Os, daily weights - AM CMP Chronic health problem Depression - continue Escitalopram 5 mg    FEN/GI: Renal diet with fluid restriction 1200 mL  PPx: Lovenox  Dispo: Pending clinical improvement.   Subjective:  Patient is lying in bed this morning. He is feeling better today. Still endorses urine output. He has not had a bowel movement in a couple of days. He states he has a little bit of abdominal pain.   Objective: Temp:  [97.7 F (36.5 C)-99.2 F (37.3 C)] 98.1 F (36.7 C) (10/14  0733) Pulse Rate:  [54-67] 54 (10/14 0733) Resp:  [14-19] 14 (10/14 0733) BP: (82-98)/(49-67) 84/59 (10/14 0733) SpO2:  [97 %-99 %] 98 % (10/14 0733) Weight:  [74.7 kg] 74.7 kg (10/14 0443) Physical Exam: General: ill-appearing, NAD Cardiovascular: RRR, no m/r/g  Respiratory: CTAB, normal work of breathing on room air  Abdomen: soft, non-tender to palpation, somewhat distended  Extremities: SCDs in place   Laboratory: Most recent CBC Lab Results  Component Value Date   WBC 9.4 12/08/2023   HGB 10.9 (L) 12/08/2023   HCT 32.2 (L) 12/08/2023   MCV 98.5 12/08/2023   PLT 123 (L) 12/08/2023   Most recent BMP    Latest Ref Rng & Units 12/08/2023    1:16 AM  BMP  Glucose 70 - 99 mg/dL 876   BUN 6 - 20 mg/dL 32   Creatinine 9.38 - 1.24 mg/dL 7.90   Sodium 864 - 854 mmol/L 132   Potassium 3.5 - 5.1 mmol/L 4.6   Chloride 98 - 111 mmol/L 102   CO2 22 - 32 mmol/L 19   Calcium 8.9 - 10.3 mg/dL 8.0    Imaging/Diagnostic Tests: No new imaging.   James Raguel MATSU, DO 12/08/2023, 8:15 AM  PGY-1, [facility] FPTS Intern pager: [phone], text pages welcome Secure chat group [facility] Teaching Service

## 2023-12-09 ENCOUNTER — Inpatient Hospital Stay (HOSPITAL_COMMUNITY)

## 2023-12-09 DIAGNOSIS — K7682 Hepatic encephalopathy: Secondary | ICD-10-CM | POA: Diagnosis not present

## 2023-12-09 DIAGNOSIS — K7460 Unspecified cirrhosis of liver: Secondary | ICD-10-CM | POA: Diagnosis not present

## 2023-12-09 DIAGNOSIS — R188 Other ascites: Secondary | ICD-10-CM | POA: Diagnosis not present

## 2023-12-09 DIAGNOSIS — N179 Acute kidney failure, unspecified: Secondary | ICD-10-CM | POA: Diagnosis not present

## 2023-12-09 DIAGNOSIS — M25562 Pain in left knee: Secondary | ICD-10-CM

## 2023-12-09 LAB — CBC
HCT: 31 % — ABNORMAL LOW (ref 39.0–52.0)
Hemoglobin: 10.5 g/dL — ABNORMAL LOW (ref 13.0–17.0)
MCH: 33 pg (ref 26.0–34.0)
MCHC: 33.9 g/dL (ref 30.0–36.0)
MCV: 97.5 fL (ref 80.0–100.0)
Platelets: 129 K/uL — ABNORMAL LOW (ref 150–400)
RBC: 3.18 MIL/uL — ABNORMAL LOW (ref 4.22–5.81)
RDW: 18.9 % — ABNORMAL HIGH (ref 11.5–15.5)
WBC: 9 K/uL (ref 4.0–10.5)
nRBC: 0 % (ref 0.0–0.2)

## 2023-12-09 LAB — BASIC METABOLIC PANEL WITH GFR
Anion gap: 8 (ref 5–15)
BUN: 32 mg/dL — ABNORMAL HIGH (ref 6–20)
CO2: 21 mmol/L — ABNORMAL LOW (ref 22–32)
Calcium: 8 mg/dL — ABNORMAL LOW (ref 8.9–10.3)
Chloride: 103 mmol/L (ref 98–111)
Creatinine, Ser: 2.03 mg/dL — ABNORMAL HIGH (ref 0.61–1.24)
GFR, Estimated: 37 mL/min — ABNORMAL LOW (ref >60)
Glucose, Bld: 107 mg/dL — ABNORMAL HIGH (ref 70–99)
Potassium: 4.8 mmol/L (ref 3.5–5.1)
Sodium: 132 mmol/L — ABNORMAL LOW (ref 135–145)

## 2023-12-09 NOTE — Progress Notes (Signed)
 This chaplain responded to a unit page for notary services. The chaplain provided education on the hospital's policy of only providing notary services for Advance Directives. The chaplain understands the Pt. has a car he hopes to sell.  The Pt. asked about the completion of the Pt. AD. Through chart review, the chaplain was able to share with the Pt. the AD has not be notarized. The Pt. will page through the RN when the Pt. has the document and necessary family at the bedside.  This chaplain is available for F/U spiritual care as needed.  Chaplain Leeroy Hummer [phone]

## 2023-12-09 NOTE — Plan of Care (Signed)
 FMTS Interim Progress Note  Contacted [facility] transfer line regarding higher level of care for decompensated cirrhosis with hepatorenal syndrome.  Transfer center reviewed patient case was stated unfortunately transfer for higher level of care will be declined as [facility] is currently at capacity.  She recommended we return call in 24 hours if desired.  Theophilus Pagan, MD 12/09/2023, 12:13 PM PGY-3, [facility] Family Medicine Service pager [phone]

## 2023-12-09 NOTE — Assessment & Plan Note (Signed)
 Afebrile, hemodynamically stable. Awake, alert, and responding appropriately.  - lactulose  10 g daily  - GI consulted, appreciate recommendations  - Continue Rifaximin 550 mg PO BID - PT/OT following - Continue delirium precautions - AM CBC/CMP

## 2023-12-09 NOTE — Plan of Care (Signed)
  Problem: Clinical Measurements: Goal: Ability to maintain clinical measurements within normal limits will improve Outcome: Progressing Goal: Will remain free from infection Outcome: Progressing Goal: Diagnostic test results will improve Outcome: Progressing Goal: Respiratory complications will improve Outcome: Progressing Goal: Cardiovascular complication will be avoided Outcome: Progressing   Problem: Nutrition: Goal: Adequate nutrition will be maintained Outcome: Progressing   Problem: Activity: Goal: Risk for activity intolerance will decrease Outcome: Progressing   Problem: Elimination: Goal: Will not experience complications related to bowel motility Outcome: Progressing Goal: Will not experience complications related to urinary retention Outcome: Progressing   Problem: Pain Managment: Goal: General experience of comfort will improve and/or be controlled Outcome: Progressing   Problem: Safety: Goal: Ability to remain free from injury will improve Outcome: Progressing   Problem: Skin Integrity: Goal: Risk for impaired skin integrity will decrease Outcome: Progressing

## 2023-12-09 NOTE — Progress Notes (Signed)
 OT Cancellation Note  Patient Details Name: James Downs MRN: [id number] DOB: [birth year]   Cancelled Treatment:    Reason Eval/Treat Not Completed: Other (comment). Pt stating he is tired and left knee really painful. Pt asking about x-rays (have been done but read pending). Pt not up to stand pivot on RLE to get to recliner (even though son saying I want to see you walk again Daddy.  Will continue to follow and see patient as schedule allows.  Donny BECKER OT Acute Rehabilitation Services Office [phone]   [place] Dorothyann Distel 12/09/2023, 3:18 PM

## 2023-12-09 NOTE — TOC Progression Note (Signed)
 Transition of Care [facility]) - Progression Note    Patient Details  Name: James Downs MRN: [id number] Date of Birth: 1965-05-29  Transition of Care [facility]) CM/SW Contact  Almarie CHRISTELLA Goodie, [place] Phone Number: 12/09/2023, 12:30 PM  Clinical Narrative:   [name] daughter, [name], chose [facility] for SNF placement when medically stable. CSW confirmed with [facility] that they would have a bed available for patient when stable. CSW to follow.  UPDATE: CSW updated by MD that patient could be stable for SNF on Thursday vs Friday. CSW reached out to Admissions at [facility] to request insurance authorization. CSW to follow.    Expected Discharge Plan: Skilled Nursing Facility Barriers to Discharge: English as a second language teacher, Continued Medical Work up, Inadequate or no insurance               Expected Discharge Plan and Services   Discharge Planning Services: CM Consult   Living arrangements for the past 2 months: Single Family Home                                       Social Drivers of Health (SDOH) Interventions SDOH Screenings   Food Insecurity: No Food Insecurity (11/24/2023)  Housing: Unknown (12/01/2023)  Transportation Needs: No Transportation Needs (11/24/2023)  Utilities: Not At Risk (11/24/2023)  Tobacco Use: High Risk (12/01/2023)    Readmission Risk Interventions     No data to display

## 2023-12-09 NOTE — Progress Notes (Signed)
 Patient wife called for updates but I was busy. I called back but her voicemail is full .

## 2023-12-09 NOTE — Assessment & Plan Note (Signed)
 Creatinine improving today 2.03. Continue to monitor. MAPs in the mid 70s.  - continue midodrine at 30 mg TID  - continue octreotide 200 mcg TID - strict I&Os, daily weights - AM CMP

## 2023-12-09 NOTE — Progress Notes (Addendum)
 Subjective: Patient seems to be awake and alert and interactive today he denies any complaints.  He has had a BM earlier today with no blood or mucus in the stool.  His belly seems soft and there is no respiratory compromise from ascites.  He would like to get food from outside as he does not like the food at the hospital but he realizes he is on unrestricted sodium diet.  Objective: Vital signs in last 24 hours: Temp:  [98.2 F (36.8 C)-98.8 F (37.1 C)] 98.8 F (37.1 C) (10/15 1058) Pulse Rate:  [58-69] 69 (10/15 1058) Resp:  [15-19] 15 (10/15 1058) BP: (68-99)/(38-64) 99/64 (10/15 1058) SpO2:  [97 %-99 %] 99 % (10/15 1058) Weight:  [71.5 kg] 71.5 kg (10/15 0628) Last BM Date : 12/08/23  Intake/Output from previous day: 10/14 0701 - 10/15 0700 In: -  Out: 650 [Urine:650] Intake/Output this shift: Total I/O In: -  Out: 200 [Urine:200]  General appearance: alert, cooperative, fatigued, icteric, and no distress Resp: clear to auscultation bilaterally Cardio: regular rate and rhythm, S1, S2 normal, no murmur, click, rub or gallop GI: soft, non-tender; bowel sounds normal; no masses,  no organomegaly  Lab Results: Recent Labs    12/07/23 1417 12/08/23 0116 12/09/23 0247  WBC 10.0 9.4 9.0  HGB 11.0* 10.9* 10.5*  HCT 31.9* 32.2* 31.0*  PLT 122* 123* 129*   BMET Recent Labs    12/07/23 0128 12/08/23 0116 12/09/23 0247  NA 131* 132* 132*  K 4.7 4.6 4.8  CL 100 102 103  CO2 22 19* 21*  GLUCOSE 117* 123* 107*  BUN 31* 32* 32*  CREATININE 2.25* 2.09* 2.03*  CALCIUM 8.1* 8.0* 8.0*   LFT Recent Labs    12/08/23 0116  PROT 5.7*  ALBUMIN  2.7*  AST 32  ALT 17  ALKPHOS 37*  BILITOT 2.1*   PT/INR Recent Labs    12/08/23 0116  LABPROT 20.1*  INR 1.6*     Medications: I have reviewed the patient's current medications. Prior to Admission:  Medications Prior to Admission  Medication Sig Dispense Refill Last Dose/Taking   aspirin EC 81 MG tablet Take 81 mg by  mouth daily. Swallow whole.   11/22/2023 Morning   carvedilol  (COREG ) 6.25 MG tablet Take 6.25 mg by mouth 2 (two) times daily. (Patient not taking: Reported on 11/23/2023)   Not Taking   furosemide  (LASIX ) 40 MG tablet Take 1 tablet (40 mg total) by mouth daily. (Patient not taking: Reported on 11/23/2023) 30 tablet 0 Not Taking   lactulose  (CHRONULAC ) 10 GM/15ML solution Take 30 mLs (20 g total) by mouth 2 (two) times daily. (Patient not taking: Reported on 11/23/2023) 1800 mL 0 Not Taking   spironolactone  (ALDACTONE ) 100 MG tablet Take 1 tablet (100 mg total) by mouth daily. 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  The primary care team had contacted the [facility] transfer center but they have not excepted the patient in transfer unfortunately. 2) Acute kidney injury superimposed on chronic kidney disease-risk for hepatorenal syndrome-worsening renal function with creatinine of 2.2.SABRA 3) Rectal bleeding with portal colopathy noted on CT scan-resolved  4) Extensive nephrolithiasis without any evidence of obstructive uropathy 5) Skin rash over lower legs. 6) Contracted gallbladder with cholelithiasis. 7) Constipation-if constipation is a problem, Colace and MiraLAX can be tried. 7) Limited code; patient does not desire  intubation if need arises in the future.  LOS: 16 days   Renaye Sous 12/09/2023, 1:39 PM

## 2023-12-09 NOTE — Assessment & Plan Note (Signed)
 Depression - continue Escitalopram 5 mg

## 2023-12-09 NOTE — Assessment & Plan Note (Signed)
 Anterior pain, around the kneecap.  Nontender to palpation.  Per patient, history of psoriatic arthritis. - Left knee x-ray pending read - Acetaminophen  650 mg every 6 hours as needed

## 2023-12-09 NOTE — Progress Notes (Signed)
 Daily Progress Note Intern Pager: [phone]  Patient name: James Downs Medical record number: [id number] Date of birth: 1966-01-06 Age: 58 y.o. Gender: male  Primary Care Provider: Patient, No Pcp Per Consultants: GI, palliative, nephrology Code Status: DNR-Limited  Pt Overview and Major Events to Date:  9/29: Admitted, paracentesis 7.9 L removed 10/1: Paracentesis 8.3 L removed 10/10: Paracentesis 4.8 L removed  Medical Decision Making:  James Downs is a 58 year old male with a PMH of NASH, varices, gastric ulcer, portal hypertension, HLD, essential hypertension, GAD, tobacco use, and OSA decompensated liver cirrhosis hepatorenal syndrome.  Potentially concern for component of CKD.  Assessment & Plan Cirrhosis of liver with ascites (HCC) Hepatorenal syndrome (HCC) Esophageal varices in cirrhosis (HCC) Afebrile, hemodynamically stable. Awake, alert, and responding appropriately.  - lactulose  10 g daily  - GI consulted, appreciate recommendations  - Continue Rifaximin 550 mg PO BID - PT/OT following - Continue delirium precautions - AM CBC/CMP AKI (acute kidney injury) Creatinine improving today 2.03. Continue to monitor. MAPs in the mid 70s.  - continue midodrine at 30 mg TID  - continue octreotide 200 mcg TID - strict I&Os, daily weights - AM CMP Knee pain, left Anterior pain, around the kneecap.  Nontender to palpation.  Per patient, history of psoriatic arthritis. - Left knee x-ray pending read - Acetaminophen  650 mg every 6 hours as needed Chronic health problem Depression - continue Escitalopram 5 mg   FEN/GI: 2 g sodium restriction, 1200 mL fluid restriction PPx: Lovenox Dispo: Pending clinical improvement.  Subjective:  Patient states he is not doing well this morning.  He complains of left anterior knee pain.  He rates his pain as an 8/10.  He states he was mobile yesterday but denies hurting his knee in any way.  He tells me that he has a  history of psoriatic arthritis that affects his knees.  Patient also has had gout in his toes previously.  He does not complain of abdominal pain today.  He has no other concerns at this time  Objective: Temp:  [98.2 F (36.8 C)-98.8 F (37.1 C)] 98.8 F (37.1 C) (10/15 1058) Pulse Rate:  [58-69] 69 (10/15 1058) Resp:  [15-19] 15 (10/15 1058) BP: (68-99)/(38-64) 99/64 (10/15 1058) SpO2:  [97 %-99 %] 99 % (10/15 1058) Weight:  [71.5 kg] 71.5 kg (10/15 9371) Physical Exam: General: Chronically ill-appearing, NAD Cardiovascular: RRR, no M/R/G Respiratory: CTAB, normal work of breathing on room air Abdomen: Soft, nontender to palpation, nondistended Extremities: TED hose in place, no erythema, edema around left anterior patella  Laboratory: Most recent CBC Lab Results  Component Value Date   WBC 9.0 12/09/2023   HGB 10.5 (L) 12/09/2023   HCT 31.0 (L) 12/09/2023   MCV 97.5 12/09/2023   PLT 129 (L) 12/09/2023   Most recent BMP    Latest Ref Rng & Units 12/09/2023    2:47 AM  BMP  Glucose 70 - 99 mg/dL 892   BUN 6 - 20 mg/dL 32   Creatinine 9.38 - 1.24 mg/dL 7.96   Sodium 864 - 854 mmol/L 132   Potassium 3.5 - 5.1 mmol/L 4.8   Chloride 98 - 111 mmol/L 103   CO2 22 - 32 mmol/L 21   Calcium 8.9 - 10.3 mg/dL 8.0    Imaging/Diagnostic Tests: Left knee x-ray, read pending  James Raguel MATSU, DO 12/09/2023, 1:15 PM  PGY-1, [facility] FPTS Intern pager: [phone], text pages welcome Secure chat group [facility] Teaching Service

## 2023-12-10 DIAGNOSIS — R188 Other ascites: Secondary | ICD-10-CM | POA: Diagnosis not present

## 2023-12-10 DIAGNOSIS — G479 Sleep disorder, unspecified: Secondary | ICD-10-CM | POA: Insufficient documentation

## 2023-12-10 DIAGNOSIS — K7460 Unspecified cirrhosis of liver: Secondary | ICD-10-CM | POA: Diagnosis not present

## 2023-12-10 LAB — CBC
HCT: 30.8 % — ABNORMAL LOW (ref 39.0–52.0)
Hemoglobin: 10.5 g/dL — ABNORMAL LOW (ref 13.0–17.0)
MCH: 33.5 pg (ref 26.0–34.0)
MCHC: 34.1 g/dL (ref 30.0–36.0)
MCV: 98.4 fL (ref 80.0–100.0)
Platelets: 130 K/uL — ABNORMAL LOW (ref 150–400)
RBC: 3.13 MIL/uL — ABNORMAL LOW (ref 4.22–5.81)
RDW: 19.4 % — ABNORMAL HIGH (ref 11.5–15.5)
WBC: 8.2 K/uL (ref 4.0–10.5)
nRBC: 0 % (ref 0.0–0.2)

## 2023-12-10 LAB — COMPREHENSIVE METABOLIC PANEL WITH GFR
ALT: 18 U/L (ref 0–44)
AST: 37 U/L (ref 15–41)
Albumin: 2.7 g/dL — ABNORMAL LOW (ref 3.5–5.0)
Alkaline Phosphatase: 42 U/L (ref 38–126)
Anion gap: 7 (ref 5–15)
BUN: 32 mg/dL — ABNORMAL HIGH (ref 6–20)
CO2: 23 mmol/L (ref 22–32)
Calcium: 8.1 mg/dL — ABNORMAL LOW (ref 8.9–10.3)
Chloride: 103 mmol/L (ref 98–111)
Creatinine, Ser: 2.11 mg/dL — ABNORMAL HIGH (ref 0.61–1.24)
GFR, Estimated: 35 mL/min — ABNORMAL LOW (ref >60)
Glucose, Bld: 113 mg/dL — ABNORMAL HIGH (ref 70–99)
Potassium: 5 mmol/L (ref 3.5–5.1)
Sodium: 133 mmol/L — ABNORMAL LOW (ref 135–145)
Total Bilirubin: 1.7 mg/dL — ABNORMAL HIGH (ref 0.0–1.2)
Total Protein: 5.9 g/dL — ABNORMAL LOW (ref 6.5–8.1)

## 2023-12-10 MED ORDER — TRAZODONE HCL 50 MG PO TABS
25.0000 mg | ORAL_TABLET | Freq: Every day | ORAL | Status: DC
  Administered 2023-12-10: 25 mg via ORAL
  Filled 2023-12-10: qty 1

## 2023-12-10 MED ORDER — LACTULOSE 10 GM/15ML PO SOLN
10.0000 g | Freq: Two times a day (BID) | ORAL | Status: DC
  Administered 2023-12-10 – 2023-12-11 (×2): 10 g via ORAL
  Filled 2023-12-10 (×2): qty 30

## 2023-12-10 NOTE — Progress Notes (Signed)
 Subjective: No complaints.  Objective: Vital signs in last 24 hours: Temp:  [97.8 F (36.6 C)-98.7 F (37.1 C)] 98.5 F (36.9 C) (10/16 1141) Pulse Rate:  [56-117] 59 (10/16 1141) Resp:  [16-20] 16 (10/16 1141) BP: (86-134)/(56-76) 111/69 (10/16 1141) SpO2:  [96 %-100 %] 100 % (10/16 1141) Last BM Date : 12/08/23  Intake/Output from previous day: 10/15 0701 - 10/16 0700 In: 480 [P.O.:480] Out: 200 [Urine:200] Intake/Output this shift: Total I/O In: -  Out: 200 [Urine:200]  General appearance: alert and no distress GI: soft, nontender, nondistended  Lab Results: Recent Labs    12/08/23 0116 12/09/23 0247 12/10/23 0239  WBC 9.4 9.0 8.2  HGB 10.9* 10.5* 10.5*  HCT 32.2* 31.0* 30.8*  PLT 123* 129* 130*   BMET Recent Labs    12/08/23 0116 12/09/23 0247 12/10/23 0239  NA 132* 132* 133*  K 4.6 4.8 5.0  CL 102 103 103  CO2 19* 21* 23  GLUCOSE 123* 107* 113*  BUN 32* 32* 32*  CREATININE 2.09* 2.03* 2.11*  CALCIUM 8.0* 8.0* 8.1*   LFT Recent Labs    12/10/23 0239  PROT 5.9*  ALBUMIN  2.7*  AST 37  ALT 18  ALKPHOS 42  BILITOT 1.7*   PT/INR Recent Labs    12/08/23 0116  LABPROT 20.1*  INR 1.6*   Hepatitis Panel No results for input(s): HEPBSAG, HCVAB, HEPAIGM, HEPBIGM in the last 72 hours. C-Diff No results for input(s): CDIFFTOX in the last 72 hours. Fecal Lactopherrin No results for input(s): FECLLACTOFRN in the last 72 hours.  Studies/Results: DG Knee 1-2 Views Left Result Date: 12/09/2023 CLINICAL DATA:  Left knee pain EXAM: LEFT KNEE - 2 VIEW COMPARISON:  None Available. FINDINGS: No evidence of fracture, dislocation, or joint effusion. No evidence of arthropathy or other focal bone abnormality. Soft tissues are unremarkable. IMPRESSION: Negative. Electronically Signed   By: Wilkie Lent M.D.   On: 12/09/2023 15:47    Medications: Scheduled:  enoxaparin (LOVENOX) injection  40 mg Subcutaneous Q24H   escitalopram  5 mg Oral  Daily   feeding supplement  237 mL Oral TID BM   lactulose   10 g Oral BID   melatonin  3 mg Oral QHS   midodrine  30 mg Oral TID WC   multivitamin with minerals  1 tablet Oral Daily   pantoprazole  40 mg Oral BID AC   rifaximin  550 mg Oral BID   traZODone   25 mg Oral QHS   Continuous:  Assessment/Plan: 1) Decompensated MASH. 2) Ascites. 3) HRS 4) Sarcopenia. 5) Immobility.   The patient is stable.  There was no positive improvement to his clinical status.  His weight is currently at 71 kg.  Previously, when he had the paracentesis his weight was at 83 kg.  His creatinine is relatively stable at 2.1.    Plan: 1) Transfer to skilled nursing facility to improve mobility.  This will improve his chances for consideration of a transplant. 2) Paracentesis when abdomen starts to feel uncomfortable. 3) Maintain 2 gram sodium diet. 4) Maintain rifaximin.  LOS: 17 days   Jeffey Janssen D 12/10/2023, 1:47 PM

## 2023-12-10 NOTE — Assessment & Plan Note (Signed)
 Afebrile, hemodynamically stable. Awake, alert, and responding appropriately.  - lactulose  10 g daily  - GI consulted, appreciate recommendations  - Continue Rifaximin 550 mg PO BID - PT/OT following - Continue delirium precautions - AM CBC/CMP

## 2023-12-10 NOTE — Plan of Care (Signed)
  Problem: Clinical Measurements: Goal: Diagnostic test results will improve Outcome: Progressing Goal: Cardiovascular complication will be avoided Outcome: Progressing   Problem: Activity: Goal: Risk for activity intolerance will decrease Outcome: Progressing   Problem: Activity: Goal: Risk for activity intolerance will decrease Outcome: Progressing   Problem: Nutrition: Goal: Adequate nutrition will be maintained Outcome: Progressing

## 2023-12-10 NOTE — Progress Notes (Signed)
     Daily Progress Note Intern Pager: [phone]  Patient name: James Downs Medical record number: [id number] Date of birth: 07-23-1965 Age: 58 y.o. Gender: male  Primary Care Provider: Patient, No Pcp Per Consultants: GI, palliative  Code Status: DNR-Limited  Pt Overview and Major Events to Date:  9/29: Admitted, paracentesis 7.9 L removed 10/1: Paracentesis 8.3 L removed 10/10: Paracentesis 4.8 L removed  Medical Decision Making:  James Downs is a 58 y.o. male with PMH of MASH, varicies, gastric ulcer, portal HTN, HLD, essential HTN, GAD, tobacco use, and OSA here with decompensated liver cirrhosis and hepatorenal syndrome potentially with a component of CKD.  Assessment & Plan Cirrhosis of liver with ascites (HCC) Hepatorenal syndrome (HCC) Esophageal varices in cirrhosis (HCC) Afebrile, hemodynamically stable. Awake, alert, and responding appropriately.  - lactulose  10 g daily  - GI consulted, appreciate recommendations  - Continue Rifaximin 550 mg PO BID - PT/OT following - Continue delirium precautions - AM CBC/CMP AKI (acute kidney injury) Creatinine 2.11 today. Continue to monitor. MAPs in the low 70s.  - continue midodrine at 30 mg TID  - DC'd octreotide today - strict I&Os, daily weights - AM CMP Knee pain, left Left knee x-ray unremarkable.  - Acetaminophen  650 mg every 6 hours as needed Difficulty sleeping Difficulty staying asleep at night. Using melatonin without benefit.  - Melatonin 3 mg at bedtime - Start trazadone 25 mg at bedtime tonight  Chronic health problem Depression - continue Escitalopram 5 mg   FEN/GI: 2g sodium restriction, 1200 mL fluid restriction  PPx: Lovenox  Dispo: Pending clinical improvement.   Subjective:  Patient seen lying in bed eating breakfast. He continues to have left knee pain this morning. He denies any abdominal pain this morning. He continues to endorse poor sleep at night with no benefit from melatonin.    Objective: Temp:  [97.8 F (36.6 C)-98.8 F (37.1 C)] 98.7 F (37.1 C) (10/16 0751) Pulse Rate:  [56-117] 62 (10/16 0751) Resp:  [15-20] 20 (10/16 0751) BP: (86-134)/(56-76) 98/60 (10/16 0751) SpO2:  [96 %-99 %] 96 % (10/16 0751) Physical Exam: General: NAD Cardiovascular: RRR, no m/r/g Respiratory: CTAB, normal work of breathing on room air  Abdomen: soft, fluid wave present, non-distended, non-tender to palpation  Extremities: ted hose in place   Laboratory: Most recent CBC Lab Results  Component Value Date   WBC 8.2 12/10/2023   HGB 10.5 (L) 12/10/2023   HCT 30.8 (L) 12/10/2023   MCV 98.4 12/10/2023   PLT 130 (L) 12/10/2023   Most recent BMP    Latest Ref Rng & Units 12/10/2023    2:39 AM  BMP  Glucose 70 - 99 mg/dL 886   BUN 6 - 20 mg/dL 32   Creatinine 9.38 - 1.24 mg/dL 7.88   Sodium 864 - 854 mmol/L 133   Potassium 3.5 - 5.1 mmol/L 5.0   Chloride 98 - 111 mmol/L 103   CO2 22 - 32 mmol/L 23   Calcium 8.9 - 10.3 mg/dL 8.1    Imaging/Diagnostic Tests: Left knee x-ray: No evidence of fracture, dislocation, or joint effusion. No evidence of arthropathy or other focal bone abnormality. Soft tissues are unremarkable.  Lennie Raguel MATSU, DO 12/10/2023, 8:02 AM  PGY-1, [facility] FPTS Intern pager: [phone], text pages welcome Secure chat group [facility] Teaching Service

## 2023-12-10 NOTE — Progress Notes (Signed)
 Occupational Therapy Treatment Patient Details Name: James Downs MRN: [id number] DOB: Jul 31, 1965 Today's Date: 12/10/2023   History of present illness 58 year old male presenting 9/29 with weakness, abdominal pain, diarrhea, and several days of altered mental status. Dx Hepatic encephalopathy, Decompensated NASH cirrhosis, Ascites, AKI with CKD. PMHof non-alcoholic cirrhosis, HTN, HLD, and nephrolithiasis,   OT comments  Patient received in supine and agreeable to OT treatment. [name] BP in supine 99/69(79).  Patient able to get to EOB with CGA and increased time.  BP seated on EOB 101/62(75).  Patient able to perform transfer to recliner with RW and min assist with BP after transfer 94/63 (74). Patient performed grooming seated in recliner and stood for a standing BP with 99/60(73).  Patient will benefit from continued inpatient follow up therapy, <3 hours/day.  Acute OT to continue to follow to address established goals to facilitate DC to next venue of care.        If plan is discharge home, recommend the following:  A little help with walking and/or transfers;A lot of help with bathing/dressing/bathroom;Assistance with cooking/housework;Help with stairs or ramp for entrance;Assist for transportation;Supervision due to cognitive status   Equipment Recommendations  Other (comment) (defer)    Recommendations for Other Services      Precautions / Restrictions Precautions Precautions: Fall Recall of Precautions/Restrictions: Impaired Precaution/Restrictions Comments: bowel incontinence, low BP Restrictions Weight Bearing Restrictions Per Provider Order: No       Mobility Bed Mobility Overal bed mobility: Needs Assistance Bed Mobility: Supine to Sit     Supine to sit: Contact guard, HOB elevated, Used rails     General bed mobility comments: increased time and effort. cues for technique, but no physical assist    Transfers Overall transfer level: Needs  assistance Equipment used: Rolling walker (2 wheels) Transfers: Sit to/from Stand, Bed to chair/wheelchair/BSC Sit to Stand: Min assist     Step pivot transfers: Min assist     General transfer comment: cues for hand placement and min assist to power up     Balance Overall balance assessment: Needs assistance Sitting-balance support: No upper extremity supported, Feet supported, Bilateral upper extremity supported Sitting balance-Leahy Scale: Good Sitting balance - Comments: sitting EOB   Standing balance support: Single extremity supported, Bilateral upper extremity supported, No upper extremity supported, During functional activity Standing balance-Leahy Scale: Poor Standing balance comment: able to stand without UE support for short bouts of time                           ADL either performed or assessed with clinical judgement   ADL Overall ADL's : Needs assistance/impaired     Grooming: Supervision/safety;Wash/dry face;Oral care;Sitting Grooming Details (indicate cue type and reason): in recliner             Lower Body Dressing: Maximal assistance;Sitting/lateral leans Lower Body Dressing Details (indicate cue type and reason): donn socks Toilet Transfer: Minimal assistance;Rolling walker (2 wheels) Toilet Transfer Details (indicate cue type and reason): simulated to recliner           General ADL Comments: patient with TED hoses on upon entry    Extremity/Trunk Assessment              Vision       Perception     Praxis     Communication Communication Communication: No apparent difficulties   Cognition Arousal: Alert Behavior During Therapy: [facility] for tasks assessed/performed  OT - Cognition Comments: patient flat affect but talkative and jovial at times                 Following commands: Impaired Following commands impaired: Follows multi-step commands with increased time      Cueing   Cueing  Techniques: Verbal cues, Tactile cues  Exercises      Shoulder Instructions       General Comments see note for BP    Pertinent Vitals/ Pain       Pain Assessment Pain Assessment: Faces Faces Pain Scale: Hurts a little bit Pain Location: L knee Pain Descriptors / Indicators: Grimacing, Guarding, Discomfort Pain Intervention(s): Limited activity within patient's tolerance, Monitored during session, Repositioned, Heat applied  Home Living                                          Prior Functioning/Environment              Frequency  Min 2X/week        Progress Toward Goals  OT Goals(current goals can now be found in the care plan section)  Progress towards OT goals: Progressing toward goals  Acute Rehab OT Goals Patient Stated Goal: to get better OT Goal Formulation: With patient Time For Goal Achievement: 12/09/23 Potential to Achieve Goals: Good ADL Goals Pt Will Perform Grooming: with supervision;standing Pt Will Perform Lower Body Dressing: with contact guard assist;sit to/from stand;sitting/lateral leans Pt Will Transfer to Toilet: with contact guard assist;ambulating;regular height toilet Pt Will Perform Toileting - Clothing Manipulation and hygiene: with contact guard assist;sit to/from stand;sitting/lateral leans Additional ADL Goal #1: Pt will recall and utilize 3 energy conservation/work simplification strategies during ADLs and functional mobility.  Plan      Co-evaluation                 AM-PAC OT 6 Clicks Daily Activity     Outcome Measure   Help from another person eating meals?: None Help from another person taking care of personal grooming?: A Little Help from another person toileting, which includes using toliet, bedpan, or urinal?: A Lot Help from another person bathing (including washing, rinsing, drying)?: A Lot Help from another person to put on and taking off regular upper body clothing?: None Help from  another person to put on and taking off regular lower body clothing?: A Lot 6 Click Score: 17    End of Session Equipment Utilized During Treatment: Gait belt;Rolling walker (2 wheels)  OT Visit Diagnosis: Unsteadiness on feet (R26.81);Muscle weakness (generalized) (M62.81)   Activity Tolerance Patient tolerated treatment well   Patient Left in chair;with call bell/phone within reach;with chair alarm set   Nurse Communication Mobility status;Other (comment) (BP)        Time: 9057-8996 OT Time Calculation (min): 21 min  Charges: OT General Charges $OT Visit: 1 Visit OT Treatments $Therapeutic Activity: 8-22 mins  Dick Laine, OTA Acute Rehabilitation Services  Office [phone]   Jeb LITTIE Laine 12/10/2023, 11:11 AM

## 2023-12-10 NOTE — Assessment & Plan Note (Signed)
 Creatinine 2.11 today. Continue to monitor. MAPs in the low 70s.  - continue midodrine at 30 mg TID  - DC'd octreotide today - strict I&Os, daily weights - AM CMP

## 2023-12-10 NOTE — Assessment & Plan Note (Signed)
 Left knee x-ray unremarkable.  - Acetaminophen  650 mg every 6 hours as needed

## 2023-12-10 NOTE — Assessment & Plan Note (Signed)
 Depression - continue Escitalopram 5 mg

## 2023-12-10 NOTE — Progress Notes (Signed)
 PT Cancellation Note  Patient Details Name: James Downs MRN: [id number] DOB: 1965-03-01   Cancelled Treatment:    Reason Eval/Treat Not Completed: (P) Fatigue/lethargy limiting ability to participate (Pt asleep upon entry and declines mobility. Pt requests therapy returns tomorrow. Will continue to follow per PT POC.)   Darryle George 12/10/2023, 4:07 PM

## 2023-12-10 NOTE — Assessment & Plan Note (Signed)
 Difficulty staying asleep at night. Using melatonin without benefit.  - Melatonin 3 mg at bedtime - Start trazadone 25 mg at bedtime tonight

## 2023-12-11 DIAGNOSIS — K7460 Unspecified cirrhosis of liver: Secondary | ICD-10-CM | POA: Diagnosis not present

## 2023-12-11 DIAGNOSIS — F411 Generalized anxiety disorder: Secondary | ICD-10-CM | POA: Diagnosis not present

## 2023-12-11 DIAGNOSIS — F17210 Nicotine dependence, cigarettes, uncomplicated: Secondary | ICD-10-CM | POA: Diagnosis not present

## 2023-12-11 DIAGNOSIS — K7581 Nonalcoholic steatohepatitis (NASH): Secondary | ICD-10-CM | POA: Diagnosis not present

## 2023-12-11 DIAGNOSIS — G4733 Obstructive sleep apnea (adult) (pediatric): Secondary | ICD-10-CM | POA: Diagnosis not present

## 2023-12-11 DIAGNOSIS — E119 Type 2 diabetes mellitus without complications: Secondary | ICD-10-CM | POA: Diagnosis not present

## 2023-12-11 DIAGNOSIS — E440 Moderate protein-calorie malnutrition: Secondary | ICD-10-CM | POA: Diagnosis not present

## 2023-12-11 DIAGNOSIS — R188 Other ascites: Secondary | ICD-10-CM | POA: Diagnosis not present

## 2023-12-11 DIAGNOSIS — K922 Gastrointestinal hemorrhage, unspecified: Secondary | ICD-10-CM | POA: Diagnosis not present

## 2023-12-11 DIAGNOSIS — G4700 Insomnia, unspecified: Secondary | ICD-10-CM | POA: Diagnosis not present

## 2023-12-11 DIAGNOSIS — K767 Hepatorenal syndrome: Secondary | ICD-10-CM | POA: Diagnosis not present

## 2023-12-11 DIAGNOSIS — L871 Reactive perforating collagenosis: Secondary | ICD-10-CM | POA: Diagnosis not present

## 2023-12-11 DIAGNOSIS — K7682 Hepatic encephalopathy: Secondary | ICD-10-CM | POA: Diagnosis not present

## 2023-12-11 DIAGNOSIS — E785 Hyperlipidemia, unspecified: Secondary | ICD-10-CM | POA: Diagnosis not present

## 2023-12-11 DIAGNOSIS — K652 Spontaneous bacterial peritonitis: Secondary | ICD-10-CM | POA: Diagnosis not present

## 2023-12-11 DIAGNOSIS — F32A Depression, unspecified: Secondary | ICD-10-CM | POA: Diagnosis not present

## 2023-12-11 LAB — CBC
HCT: 30.9 % — ABNORMAL LOW (ref 39.0–52.0)
Hemoglobin: 10.6 g/dL — ABNORMAL LOW (ref 13.0–17.0)
MCH: 33.4 pg (ref 26.0–34.0)
MCHC: 34.3 g/dL (ref 30.0–36.0)
MCV: 97.5 fL (ref 80.0–100.0)
Platelets: 118 K/uL — ABNORMAL LOW (ref 150–400)
RBC: 3.17 MIL/uL — ABNORMAL LOW (ref 4.22–5.81)
RDW: 19.7 % — ABNORMAL HIGH (ref 11.5–15.5)
WBC: 6.4 K/uL (ref 4.0–10.5)
nRBC: 0 % (ref 0.0–0.2)

## 2023-12-11 LAB — BASIC METABOLIC PANEL WITH GFR
Anion gap: 9 (ref 5–15)
BUN: 32 mg/dL — ABNORMAL HIGH (ref 6–20)
CO2: 22 mmol/L (ref 22–32)
Calcium: 8.2 mg/dL — ABNORMAL LOW (ref 8.9–10.3)
Chloride: 101 mmol/L (ref 98–111)
Creatinine, Ser: 2.32 mg/dL — ABNORMAL HIGH (ref 0.61–1.24)
GFR, Estimated: 32 mL/min — ABNORMAL LOW (ref >60)
Glucose, Bld: 107 mg/dL — ABNORMAL HIGH (ref 70–99)
Potassium: 5.1 mmol/L (ref 3.5–5.1)
Sodium: 132 mmol/L — ABNORMAL LOW (ref 135–145)

## 2023-12-11 LAB — PROTIME-INR
INR: 1.6 — ABNORMAL HIGH (ref 0.8–1.2)
Prothrombin Time: 19.5 s — ABNORMAL HIGH (ref 11.4–15.2)

## 2023-12-11 MED ORDER — MIDODRINE HCL 10 MG PO TABS: 30.0000 mg | ORAL_TABLET | Freq: Three times a day (TID) | ORAL | Status: AC

## 2023-12-11 MED ORDER — ESCITALOPRAM OXALATE 5 MG PO TABS: 5.0000 mg | ORAL_TABLET | Freq: Every day | ORAL | Status: AC

## 2023-12-11 MED ORDER — ADULT MULTIVITAMIN W/MINERALS CH: 1.0000 | ORAL_TABLET | Freq: Every day | ORAL | Status: AC

## 2023-12-11 MED ORDER — LACTULOSE 10 GM/15ML PO SOLN: 10.0000 g | Freq: Two times a day (BID) | ORAL | Status: DC

## 2023-12-11 MED ORDER — RIFAXIMIN 550 MG PO TABS: 550.0000 mg | ORAL_TABLET | Freq: Two times a day (BID) | ORAL | Status: AC

## 2023-12-11 MED ORDER — ENSURE PLUS HIGH PROTEIN PO LIQD: 237.0000 mL | Freq: Three times a day (TID) | ORAL | Status: DC

## 2023-12-11 MED ORDER — ACETAMINOPHEN 325 MG PO TABS: 650.0000 mg | ORAL_TABLET | Freq: Three times a day (TID) | ORAL | Status: AC | PRN

## 2023-12-11 MED ORDER — PANTOPRAZOLE SODIUM 40 MG PO TBEC: 40.0000 mg | DELAYED_RELEASE_TABLET | Freq: Two times a day (BID) | ORAL | Status: AC

## 2023-12-11 NOTE — Assessment & Plan Note (Addendum)
 Afebrile, hemodynamically stable. Awake, alert, and responding appropriately.  - Lactulose  10 g BID  - GI consulted, appreciate recommendations  - Continue Rifaximin 550 mg PO BID - PT/OT following - Continue delirium precautions - AM CBC/CMP

## 2023-12-11 NOTE — Progress Notes (Signed)
 Subjective: No complaints.  Feeling well, but still very fatigued.  Objective: Vital signs in last 24 hours: Temp:  [97.6 F (36.4 C)-98.7 F (37.1 C)] 97.9 F (36.6 C) (10/17 1131) Pulse Rate:  [59-67] 64 (10/17 1131) Resp:  [15-17] 16 (10/17 1131) BP: (90-103)/(61-66) 93/61 (10/17 1131) SpO2:  [96 %-98 %] 98 % (10/17 1131) Weight:  [72.8 kg] 72.8 kg (10/17 0703) Last BM Date : 12/10/23  Intake/Output from previous day: 10/16 0701 - 10/17 0700 In: 480 [P.O.:480] Out: 450 [Urine:450] Intake/Output this shift: No intake/output data recorded.  General appearance: alert and no distress GI: soft, non-tender; bowel sounds normal; no masses,  no organomegaly  Lab Results: Recent Labs    12/09/23 0247 12/10/23 0239 12/11/23 0551  WBC 9.0 8.2 6.4  HGB 10.5* 10.5* 10.6*  HCT 31.0* 30.8* 30.9*  PLT 129* 130* 118*   BMET Recent Labs    12/09/23 0247 12/10/23 0239 12/11/23 0551  NA 132* 133* 132*  K 4.8 5.0 5.1  CL 103 103 101  CO2 21* 23 22  GLUCOSE 107* 113* 107*  BUN 32* 32* 32*  CREATININE 2.03* 2.11* 2.32*  CALCIUM 8.0* 8.1* 8.2*   LFT Recent Labs    12/10/23 0239  PROT 5.9*  ALBUMIN  2.7*  AST 37  ALT 18  ALKPHOS 42  BILITOT 1.7*   PT/INR Recent Labs    12/11/23 0551  LABPROT 19.5*  INR 1.6*   Hepatitis Panel No results for input(s): HEPBSAG, HCVAB, HEPAIGM, HEPBIGM in the last 72 hours. C-Diff No results for input(s): CDIFFTOX in the last 72 hours. Fecal Lactopherrin No results for input(s): FECLLACTOFRN in the last 72 hours.  Studies/Results: No results found.  Medications: Scheduled:  enoxaparin (LOVENOX) injection  40 mg Subcutaneous Q24H   escitalopram  5 mg Oral Daily   feeding supplement  237 mL Oral TID BM   lactulose   10 g Oral BID   melatonin  3 mg Oral QHS   midodrine  30 mg Oral TID WC   multivitamin with minerals  1 tablet Oral Daily   pantoprazole  40 mg Oral BID AC   rifaximin  550 mg Oral BID   traZODone    25 mg Oral QHS   Continuous:  Assessment/Plan: 1) Decompensated MASH cirrhosis. 2) HRS. 3) HE.   He is being discharged to a skilled nursing facility.  Hopefully this will improve his mobility and ultimately a chance for transplant evaluation.  Plan: 1)  Periodic paracenteses. 2) Agree with SNF. 3) Follow up once SNF is complete.  LOS: 18 days   James Downs D 12/11/2023, 2:23 PM

## 2023-12-11 NOTE — Assessment & Plan Note (Signed)
 Depression - continue Escitalopram 5 mg

## 2023-12-11 NOTE — Plan of Care (Signed)
  Problem: Clinical Measurements: Goal: Ability to maintain clinical measurements within normal limits will improve Outcome: Progressing Goal: Diagnostic test results will improve Outcome: Progressing   Problem: Elimination: Goal: Will not experience complications related to bowel motility Outcome: Progressing   Problem: Skin Integrity: Goal: Risk for impaired skin integrity will decrease Outcome: Progressing   

## 2023-12-11 NOTE — TOC Transition Note (Signed)
 Transition of Care [facility]) - Discharge Note   Patient Details  Name: James Downs MRN: [id number] Date of Birth: [birth year]  [facility] CM/SW Contact:  Almarie CHRISTELLA Goodie, LCSW Phone Number: 12/11/2023, 1:41 PM   Clinical Narrative:   CSW updated by [facility] that authorization was approved, patient can admit today. CSW updated MD, patient is stable for discharge. CSW sent discharge information to [facility]. CSW met with patient's family at bedside to discuss, they are in agreement. Transport arranged with [facility] for next available.  Nurse to call report to [phone], Room 202A    Final next level of care: Skilled Nursing Facility Barriers to Discharge: Barriers Resolved   Patient Goals and CMS Choice            Discharge Placement              Patient chooses bed at:  ([facility]) Patient to be transferred to facility by: [facility] Name of family member notified: Spouse and son at bedside Patient and family notified of of transfer: 12/11/23  Discharge Plan and Services Additional resources added to the After Visit Summary for     Discharge Planning Services: CM Consult                                 Social Drivers of Health (SDOH) Interventions SDOH Screenings   Food Insecurity: No Food Insecurity (11/24/2023)  Housing: Unknown (12/01/2023)  Transportation Needs: No Transportation Needs (11/24/2023)  Utilities: Not At Risk (11/24/2023)  Tobacco Use: High Risk (12/01/2023)     Readmission Risk Interventions     No data to display

## 2023-12-11 NOTE — Assessment & Plan Note (Addendum)
 Patient able to sleep well overnight with addition of Trazadone.  - Melatonin 3 mg at bedtime - Start trazadone 25 mg at bedtime tonight

## 2023-12-11 NOTE — Progress Notes (Signed)
 [name] to be discharged to [facility] per MD order. Report called by Jon Greulich.  Skin clean, dry, and intact without evidence of skin break down. Buttocks red but blanchable. IV catheter discontinued intact. Site without signs and symptoms of complications. Dressing and pressure applied.  An After Visit Summary was printed and given to [facility] . Patient escorted via stretcher, and discharged via [facility].  James Downs  12/11/2023 3:49 PM

## 2023-12-11 NOTE — Discharge Summary (Addendum)
 [facility] Discharge Summary  Patient name: James Downs Medical record number: [id number] Date of birth: January 17, 1966 Age: 58 y.o. Gender: male Date of Admission: 11/23/2023  Date of Discharge: 12/11/2023 Admitting Physician: Fairy Amy, MD  Primary Care Provider: Patient, No Pcp Per Consultants: GI, palliative, nephrology   Indication for Hospitalization: AMS  Discharge Diagnoses/Problem List:  Principal Problem for Admission: Cirrhosis of liver with ascites  Other Problems addressed during stay:  Principal Problem:   Cirrhosis of liver with ascites (HCC) Active Problems:   Reactive perforating collagenosis   T2DM (type 2 diabetes mellitus) (HCC)   Hepatorenal syndrome (HCC)   Black stool   Esophageal varices in cirrhosis (HCC)   SBP (spontaneous bacterial peritonitis) (HCC)   Depression   AKI (acute kidney injury)   Hepatic encephalopathy (HCC)   Goals of care, counseling/discussion   Palliative care by specialist   Malnutrition of moderate degree   Difficulty sleeping   Brief Hospital Course:  James Downs is a 58 y.o. male with history of HLD, GAD, tobacco use, OSA, HTN who was admitted for decompensated liver cirrhosis with hepatorenal syndrome, esophageal varices, and hepatic encephalopathy.  Decompensated liver cirrhosis Hepatorenal syndrome Hepatic encephalopathy Presented in a tenuous state, hypotensive and tachycardic-however initially maintaining MAPs greater than 60 and ICU admission was not pursued.  MELD score of 28 (estimated 20% mortality in 90 days).  CLIF-SOFA score of 10. 8 L of fluid was removed via paracentesis, and patient was started on albumin  which did improve blood pressure. Patient was also started on antibiotics for SBP prophylaxis which were discontinued 10/2 since patient remained afebrile with no concern for SBP. Nephrology was consulted and recommended octreotide and midodrine for blood pressure  support with goal MAP of 105-110. He was also started on Rifaximin 550 mg PO BID on 9/30. GI consulted, and recommended continuing lactulose . Repeat paracentesis with 8.3L removed due to continued discomfort. Palliative care was consulted and family declined comfort-based care at the time. A third paracentesis removed 4.8L. Octreotide was discontinued prior to discharge. [name] MAPs were in the 70s at time of discharge. [facility] was called this admission about patient's eligibility for liver transplant and potential transfer. Patient is not currently a candidate due to physical deconditioning and inconsistent follow-up.   AKI vs. CKD 1.81 on admission, increased to 2.25 with a peak at 2.32 on day of discharge. Previously 2.3 back in September. 2.0-2.3 appears to be new baseline. Patient also has become more oliguric during this admission. After discussion with [facility] about potential transfer, they believe patient may have component of CKD on top of hepatorenal syndrome.   Esophageal varices Black stool Reported at least 1 week of 4-8 black stools per day.  Known esophageal varices, and hemorrhoids.  Started octreotide as above, held beta-blockers until blood pressure improved.  Additionally, general surgery was consulted for concern of bowel obstruction on CT abdomen pelvis-however after discussion, this was felt to be likely a finding related to volume overload versus true obstruction.    Reactive perforating collagenosis Extensive rash over bilateral lower extremities.  Rash spares palms and soles.This is a chronic presentation, with signs of excoriation.  Ultimately given chronic disease, thought to be reactive perforating collagenosis.  Major depressive disorder Concern that MDD may be a contributing factor to current presentation. Started Lexapro 5mg  daily during hospitalization. Will take 4-6 weeks for full effect and recommend outpatient follow up for medication management and CBT.  Other chronic  conditions were medically  managed with home medications and formulary alternatives as necessary (NASH, gastric ulcer, portal HTN, HLD, HTN, GAD, tobacco use, OSA, nephrolithiasis)  Follow-up recommendations: Recommend outpatient CBT Patient should not be Aspirin due to multiple bleeding sources  Consider increasing Lexapro 11/1 if still depressed, max dose 10mg  daily Needs to be able to complete 6 minute walk test and show consistent follow-up/adherence to be considered for transplant.     Results/Tests Pending at Time of Discharge:  Unresulted Labs (From admission, onward)     Start     Ordered   12/12/23 0500  Basic metabolic panel  Tomorrow morning,   R       Question:  Specimen collection method  Answer:  Lab=Lab collect   12/11/23 1103             Disposition: SNF  Discharge Condition: Medically stable for discharge to SNF  Discharge Exam:  Vitals:   12/11/23 0804 12/11/23 1131  BP: 103/66 93/61  Pulse: 60 64  Resp: 16 16  Temp: 97.6 F (36.4 C) 97.9 F (36.6 C)  SpO2: 97% 98%   General: NAD Cardiovascular: RRR, no M/R/G Respiratory: CTAB, normal work of breathing on room air Abdomen: Soft, fluid wave present, nondistended, nontender to palpation, bowel sounds present Extremities: TED hose in place, left knee nontender to palpation  Significant Procedures:  9/29: Paracentesis 7.9 L removed 10/1: Paracentesis 8.3 L removed 10/10: Paracentesis 4.8 L removed  Significant Labs and Imaging:  Recent Labs  Lab 12/10/23 0239 12/11/23 0551  WBC 8.2 6.4  HGB 10.5* 10.6*  HCT 30.8* 30.9*  PLT 130* 118*   Recent Labs  Lab 12/10/23 0239 12/11/23 0551  NA 133* 132*  K 5.0 5.1  CL 103 101  CO2 23 22  GLUCOSE 113* 107*  BUN 32* 32*  CREATININE 2.11* 2.32*  CALCIUM 8.1* 8.2*  ALKPHOS 42  --   AST 37  --   ALT 18  --   ALBUMIN  2.7*  --     Pertinent Imaging    CTAP 1. Cirrhosis with a Large volume of Ascites, progressed since March, and fluid  newly distending the bilateral inguinal canals. 2. Little to no renal contrast excretion on the delayed images raising the possibility of renal insufficiency, hepatorenal syndrome. Underlying extensive nephrolithiasis but no evidence of obstructive uropathy. 3. Intermittent circumferential large bowel wall thickening, consider Portal Colopathy versus Acute Colitis. Bowel obstruction. 4. Cholelithiasis.  Aortic Atherosclerosis (ICD10-I70.0).  CT Head No acute intracranial abnormality.   US  Renal 1. Bilateral kidneys are within normal limits. No hydronephrosis. 2. Cirrhotic liver with moderate ascites.  Liver US  Cirrhotic change of the liver. Diffuse ascites. No reversal of portal venous flow.  Left Knee X-ray No evidence of fracture, dislocation, or joint effusion. No evidence of arthropathy or other focal bone abnormality. Soft tissues are unremarkable.  Discharge Medications:  Allergies as of 12/11/2023   No Known Allergies      Medication List     PAUSE taking these medications    carvedilol  6.25 MG tablet Wait to take this until your doctor or other care provider tells you to start again. Commonly known as: COREG  Take 6.25 mg by mouth 2 (two) times daily.   furosemide  40 MG tablet Wait to take this until your doctor or other care provider tells you to start again. Commonly known as: Lasix  Take 1 tablet (40 mg total) by mouth daily.   spironolactone  100 MG tablet Wait to take this until your doctor or  other care provider tells you to start again. Commonly known as: ALDACTONE  Take 1 tablet (100 mg total) by mouth daily.       TAKE these medications    acetaminophen  325 MG tablet Commonly known as: TYLENOL  Take 2 tablets (650 mg total) by mouth every 8 (eight) hours as needed for mild pain (pain score 1-3) or fever (or Fever >/= 101).   aspirin EC 81 MG tablet Take 81 mg by mouth daily. Swallow whole.   escitalopram 5 MG tablet Commonly known as:  LEXAPRO Take 1 tablet (5 mg total) by mouth daily.   feeding supplement Liqd Take 237 mLs by mouth 3 (three) times daily between meals.   lactulose  10 GM/15ML solution Commonly known as: CHRONULAC  Take 15 mLs (10 g total) by mouth 2 (two) times daily. What changed: how much to take   midodrine 10 MG tablet Commonly known as: PROAMATINE Take 3 tablets (30 mg total) by mouth 3 (three) times daily with meals.   multivitamin with minerals Tabs tablet Take 1 tablet by mouth daily.   pantoprazole 40 MG tablet Commonly known as: PROTONIX Take 1 tablet (40 mg total) by mouth 2 (two) times daily before a meal.   rifaximin 550 MG Tabs tablet Commonly known as: XIFAXAN Take 1 tablet (550 mg total) by mouth 2 (two) times daily.        Discharge Instructions: Please refer to Patient Instructions section of EMR for full details.  Patient was counseled important signs and symptoms that should prompt return to medical care, changes in medications, dietary instructions, activity restrictions, and follow up appointments.   Follow-Up Appointments:  Contact information for follow-up providers     Lorrane Pac, MD Follow up.   Specialty: Family Medicine Contact information: [address] [place] [address] [phone]              Contact information for after-discharge care     Destination     [facility] .   Service: Skilled Nursing Contact information: [address] [place]  [address] [phone]                     Lennie Raguel MATSU, DO 12/11/2023, 12:31 PM PGY-1, [facility]   Upper Level Attestation I have seen and examined the patient with the resident. I agree with the history, physical, and assessment above.  Lucie Pinal, DO PGY-2, Family Medicine

## 2023-12-11 NOTE — Progress Notes (Signed)
     Daily Progress Note Intern Pager: [phone]  Patient name: James Downs Medical record number: [id number] Date of birth: Mar 27, 1965 Age: 58 y.o. Gender: male  Primary Care Provider: Patient, No Pcp Per Consultants: GI, palliative  Code Status: DNR-Limited   Pt Overview and Major Events to Date:  9/29: Admitted, paracentesis 7.9 L removed 10/1: Paracentesis 8.3 L removed 10/10: Paracentesis 4.8 L removed  Medical Decision Making:  James Downs is a 58 y.o. male with a PMH of MASH, varicies, gastric ulcer, portal HTN, HLD, essential HTN, GAD, tobacco use, and OSA admitted for decompensated liver cirrhosis and hepatorenal syndrome with potentially a component of CKD. Now medically stable for discharge to SNF pending insurance authorization.  Assessment & Plan Cirrhosis of liver with ascites (HCC) Hepatorenal syndrome (HCC) Esophageal varices in cirrhosis (HCC) Afebrile, hemodynamically stable. Awake, alert, and responding appropriately.  - Lactulose  10 g BID  - GI consulted, appreciate recommendations  - Continue Rifaximin 550 mg PO BID - PT/OT following - Continue delirium precautions - AM CBC/CMP AKI (acute kidney injury) Creatinine 2.32 today. Continue to monitor. MAPs in the mid-high 70s.  - continue midodrine at 30 mg TID  - strict I&Os, daily weights - AM CMP Difficulty sleeping Patient able to sleep well overnight with addition of Trazadone.  - Melatonin 3 mg at bedtime - Start trazadone 25 mg at bedtime tonight  Knee pain, left (Resolved: 12/11/2023) No knee pain this morning.  - Acetaminophen  650 mg every 6 hours scheduled  Chronic health problem Depression - continue Escitalopram 5 mg  FEN/GI: 2 g sodium restriction, 1200 mL fluid restriction  PPx: Lovenox  Dispo: SNF pending insurance authorization.   Subjective:  Patient seen lying in bed this morning looking at the window.  He states that he feels all right this morning.  He states that the  pain in his left knee is gone this morning.  He slept well last night.  Continues to state that his abdomen feels fine.  Denies any pain this morning.  Objective: Temp:  [97.6 F (36.4 C)-98.7 F (37.1 C)] 97.6 F (36.4 C) (10/17 0345) Pulse Rate:  [59-67] 67 (10/17 0345) Resp:  [15-20] 16 (10/17 0345) BP: (90-111)/(60-69) 93/65 (10/17 0345) SpO2:  [96 %-100 %] 96 % (10/17 0345) Physical Exam: General: NAD Cardiovascular: RRR, no M/R/G Respiratory: CTAB, normal work of breathing on room air Abdomen: Soft, fluid wave present, nondistended, nontender to palpation, bowel sounds present Extremities: TED hose in place, left knee nontender to palpation  Laboratory: Most recent CBC Lab Results  Component Value Date   WBC 6.4 12/11/2023   HGB 10.6 (L) 12/11/2023   HCT 30.9 (L) 12/11/2023   MCV 97.5 12/11/2023   PLT 118 (L) 12/11/2023   Most recent BMP    Latest Ref Rng & Units 12/11/2023    5:51 AM  BMP  Glucose 70 - 99 mg/dL 892   BUN 6 - 20 mg/dL 32   Creatinine 9.38 - 1.24 mg/dL 7.67   Sodium 864 - 854 mmol/L 132   Potassium 3.5 - 5.1 mmol/L 5.1   Chloride 98 - 111 mmol/L 101   CO2 22 - 32 mmol/L 22   Calcium 8.9 - 10.3 mg/dL 8.2    Imaging/Diagnostic Tests: No new imaging.   James Raguel MATSU, DO 12/11/2023, 7:38 AM  PGY-1, [facility] Family Medicine FPTS Intern pager: [phone], text pages welcome Secure chat group [facility] Teaching Service

## 2023-12-11 NOTE — Assessment & Plan Note (Addendum)
 Creatinine 2.32 today. Continue to monitor. MAPs in the mid-high 70s.  - continue midodrine at 30 mg TID  - strict I&Os, daily weights - AM CMP

## 2023-12-11 NOTE — Assessment & Plan Note (Addendum)
 No knee pain this morning.  - Acetaminophen  650 mg every 6 hours scheduled

## 2023-12-15 DIAGNOSIS — K767 Hepatorenal syndrome: Secondary | ICD-10-CM | POA: Diagnosis not present

## 2023-12-15 DIAGNOSIS — G4730 Sleep apnea, unspecified: Secondary | ICD-10-CM | POA: Diagnosis not present

## 2023-12-15 DIAGNOSIS — Z6825 Body mass index (BMI) 25.0-25.9, adult: Secondary | ICD-10-CM | POA: Diagnosis not present

## 2023-12-15 DIAGNOSIS — G4700 Insomnia, unspecified: Secondary | ICD-10-CM | POA: Diagnosis not present

## 2023-12-15 DIAGNOSIS — E440 Moderate protein-calorie malnutrition: Secondary | ICD-10-CM | POA: Diagnosis not present

## 2023-12-15 DIAGNOSIS — N183 Chronic kidney disease, stage 3 unspecified: Secondary | ICD-10-CM | POA: Diagnosis not present

## 2023-12-15 DIAGNOSIS — I959 Hypotension, unspecified: Secondary | ICD-10-CM | POA: Diagnosis not present

## 2023-12-15 DIAGNOSIS — F331 Major depressive disorder, recurrent, moderate: Secondary | ICD-10-CM | POA: Diagnosis not present

## 2023-12-15 DIAGNOSIS — R188 Other ascites: Secondary | ICD-10-CM | POA: Diagnosis not present

## 2023-12-15 DIAGNOSIS — D696 Thrombocytopenia, unspecified: Secondary | ICD-10-CM | POA: Diagnosis not present

## 2023-12-15 DIAGNOSIS — K7682 Hepatic encephalopathy: Secondary | ICD-10-CM | POA: Diagnosis not present

## 2023-12-15 DIAGNOSIS — K7460 Unspecified cirrhosis of liver: Secondary | ICD-10-CM | POA: Diagnosis not present

## 2023-12-17 DIAGNOSIS — K7460 Unspecified cirrhosis of liver: Secondary | ICD-10-CM | POA: Diagnosis not present

## 2023-12-17 DIAGNOSIS — I8511 Secondary esophageal varices with bleeding: Secondary | ICD-10-CM | POA: Diagnosis not present

## 2023-12-17 DIAGNOSIS — F331 Major depressive disorder, recurrent, moderate: Secondary | ICD-10-CM | POA: Diagnosis not present

## 2023-12-17 DIAGNOSIS — K7682 Hepatic encephalopathy: Secondary | ICD-10-CM | POA: Diagnosis not present

## 2023-12-18 DIAGNOSIS — G4700 Insomnia, unspecified: Secondary | ICD-10-CM | POA: Diagnosis not present

## 2023-12-22 DIAGNOSIS — N189 Chronic kidney disease, unspecified: Secondary | ICD-10-CM | POA: Diagnosis not present

## 2023-12-22 DIAGNOSIS — K7682 Hepatic encephalopathy: Secondary | ICD-10-CM | POA: Diagnosis not present

## 2023-12-22 DIAGNOSIS — R188 Other ascites: Secondary | ICD-10-CM | POA: Diagnosis not present

## 2023-12-22 DIAGNOSIS — E785 Hyperlipidemia, unspecified: Secondary | ICD-10-CM | POA: Diagnosis not present

## 2023-12-22 DIAGNOSIS — E871 Hypo-osmolality and hyponatremia: Secondary | ICD-10-CM | POA: Diagnosis not present

## 2023-12-22 DIAGNOSIS — E7220 Disorder of urea cycle metabolism, unspecified: Secondary | ICD-10-CM | POA: Diagnosis not present

## 2023-12-22 DIAGNOSIS — K7469 Other cirrhosis of liver: Secondary | ICD-10-CM | POA: Diagnosis not present

## 2023-12-22 DIAGNOSIS — K7290 Hepatic failure, unspecified without coma: Secondary | ICD-10-CM | POA: Diagnosis not present

## 2023-12-22 DIAGNOSIS — K7460 Unspecified cirrhosis of liver: Secondary | ICD-10-CM | POA: Diagnosis not present

## 2023-12-22 DIAGNOSIS — Z91199 Patient's noncompliance with other medical treatment and regimen due to unspecified reason: Secondary | ICD-10-CM | POA: Diagnosis not present

## 2023-12-22 DIAGNOSIS — F411 Generalized anxiety disorder: Secondary | ICD-10-CM | POA: Diagnosis not present

## 2023-12-22 DIAGNOSIS — R509 Fever, unspecified: Secondary | ICD-10-CM | POA: Diagnosis not present

## 2023-12-22 DIAGNOSIS — K652 Spontaneous bacterial peritonitis: Secondary | ICD-10-CM | POA: Diagnosis not present

## 2023-12-22 DIAGNOSIS — N179 Acute kidney failure, unspecified: Secondary | ICD-10-CM | POA: Diagnosis not present

## 2023-12-22 DIAGNOSIS — R401 Stupor: Secondary | ICD-10-CM | POA: Diagnosis not present

## 2023-12-22 DIAGNOSIS — Z66 Do not resuscitate: Secondary | ICD-10-CM | POA: Diagnosis not present

## 2023-12-22 DIAGNOSIS — Z79899 Other long term (current) drug therapy: Secondary | ICD-10-CM | POA: Diagnosis not present

## 2023-12-22 DIAGNOSIS — Z8673 Personal history of transient ischemic attack (TIA), and cerebral infarction without residual deficits: Secondary | ICD-10-CM | POA: Diagnosis not present

## 2023-12-22 DIAGNOSIS — R4182 Altered mental status, unspecified: Secondary | ICD-10-CM | POA: Diagnosis not present

## 2023-12-22 DIAGNOSIS — Z5971 Insufficient health insurance coverage: Secondary | ICD-10-CM | POA: Diagnosis not present

## 2023-12-22 DIAGNOSIS — I129 Hypertensive chronic kidney disease with stage 1 through stage 4 chronic kidney disease, or unspecified chronic kidney disease: Secondary | ICD-10-CM | POA: Diagnosis not present

## 2023-12-22 DIAGNOSIS — G4700 Insomnia, unspecified: Secondary | ICD-10-CM | POA: Diagnosis not present

## 2023-12-22 DIAGNOSIS — Z7982 Long term (current) use of aspirin: Secondary | ICD-10-CM | POA: Diagnosis not present

## 2023-12-24 DIAGNOSIS — R188 Other ascites: Secondary | ICD-10-CM | POA: Diagnosis not present

## 2023-12-24 DIAGNOSIS — I491 Atrial premature depolarization: Secondary | ICD-10-CM | POA: Diagnosis not present

## 2023-12-26 DIAGNOSIS — R4182 Altered mental status, unspecified: Secondary | ICD-10-CM | POA: Diagnosis not present

## 2023-12-27 DIAGNOSIS — R4182 Altered mental status, unspecified: Secondary | ICD-10-CM | POA: Diagnosis not present

## 2023-12-28 DIAGNOSIS — R188 Other ascites: Secondary | ICD-10-CM | POA: Diagnosis not present

## 2023-12-28 DIAGNOSIS — R4182 Altered mental status, unspecified: Secondary | ICD-10-CM | POA: Diagnosis not present

## 2023-12-29 DIAGNOSIS — R4182 Altered mental status, unspecified: Secondary | ICD-10-CM | POA: Diagnosis not present

## 2023-12-30 DIAGNOSIS — F17210 Nicotine dependence, cigarettes, uncomplicated: Secondary | ICD-10-CM | POA: Diagnosis not present

## 2023-12-30 DIAGNOSIS — K7460 Unspecified cirrhosis of liver: Secondary | ICD-10-CM | POA: Diagnosis not present

## 2023-12-30 DIAGNOSIS — I129 Hypertensive chronic kidney disease with stage 1 through stage 4 chronic kidney disease, or unspecified chronic kidney disease: Secondary | ICD-10-CM | POA: Diagnosis not present

## 2023-12-30 DIAGNOSIS — F411 Generalized anxiety disorder: Secondary | ICD-10-CM | POA: Diagnosis not present

## 2023-12-30 DIAGNOSIS — L871 Reactive perforating collagenosis: Secondary | ICD-10-CM | POA: Diagnosis not present

## 2023-12-30 DIAGNOSIS — E785 Hyperlipidemia, unspecified: Secondary | ICD-10-CM | POA: Diagnosis not present

## 2023-12-30 DIAGNOSIS — E440 Moderate protein-calorie malnutrition: Secondary | ICD-10-CM | POA: Diagnosis not present

## 2023-12-30 DIAGNOSIS — I959 Hypotension, unspecified: Secondary | ICD-10-CM | POA: Diagnosis not present

## 2023-12-30 DIAGNOSIS — Z7982 Long term (current) use of aspirin: Secondary | ICD-10-CM | POA: Diagnosis not present

## 2023-12-30 DIAGNOSIS — K7682 Hepatic encephalopathy: Secondary | ICD-10-CM | POA: Diagnosis not present

## 2023-12-30 DIAGNOSIS — K7290 Hepatic failure, unspecified without coma: Secondary | ICD-10-CM | POA: Diagnosis not present

## 2023-12-30 DIAGNOSIS — F32A Depression, unspecified: Secondary | ICD-10-CM | POA: Diagnosis not present

## 2023-12-30 DIAGNOSIS — K7581 Nonalcoholic steatohepatitis (NASH): Secondary | ICD-10-CM | POA: Diagnosis not present

## 2023-12-30 DIAGNOSIS — E1122 Type 2 diabetes mellitus with diabetic chronic kidney disease: Secondary | ICD-10-CM | POA: Diagnosis not present

## 2023-12-30 DIAGNOSIS — R4182 Altered mental status, unspecified: Secondary | ICD-10-CM | POA: Diagnosis not present

## 2023-12-30 DIAGNOSIS — R188 Other ascites: Secondary | ICD-10-CM | POA: Diagnosis not present

## 2023-12-30 DIAGNOSIS — G4700 Insomnia, unspecified: Secondary | ICD-10-CM | POA: Diagnosis not present

## 2023-12-30 DIAGNOSIS — N189 Chronic kidney disease, unspecified: Secondary | ICD-10-CM | POA: Diagnosis not present

## 2023-12-30 DIAGNOSIS — K767 Hepatorenal syndrome: Secondary | ICD-10-CM | POA: Diagnosis not present

## 2023-12-30 DIAGNOSIS — K644 Residual hemorrhoidal skin tags: Secondary | ICD-10-CM | POA: Diagnosis not present

## 2023-12-30 DIAGNOSIS — G4733 Obstructive sleep apnea (adult) (pediatric): Secondary | ICD-10-CM | POA: Diagnosis not present

## 2023-12-30 NOTE — Nursing Note (Signed)
 Pt left hospital with [facility] at 1300. This RN called report to Bethany at [facility]. Pt belongings sent with him and EMS. Pt Aox4 & on room air at this time. PIV removed prior to departure. This RN educated pt on discharge instructions. He verbalized understanding and did not have any further questions.

## 2023-12-31 DIAGNOSIS — K7682 Hepatic encephalopathy: Secondary | ICD-10-CM | POA: Diagnosis not present

## 2023-12-31 DIAGNOSIS — K767 Hepatorenal syndrome: Secondary | ICD-10-CM | POA: Diagnosis not present

## 2023-12-31 DIAGNOSIS — I8511 Secondary esophageal varices with bleeding: Secondary | ICD-10-CM | POA: Diagnosis not present

## 2023-12-31 DIAGNOSIS — F331 Major depressive disorder, recurrent, moderate: Secondary | ICD-10-CM | POA: Diagnosis not present

## 2024-01-01 DIAGNOSIS — K767 Hepatorenal syndrome: Secondary | ICD-10-CM | POA: Diagnosis not present

## 2024-01-01 DIAGNOSIS — I959 Hypotension, unspecified: Secondary | ICD-10-CM | POA: Diagnosis not present

## 2024-01-01 DIAGNOSIS — K7682 Hepatic encephalopathy: Secondary | ICD-10-CM | POA: Diagnosis not present

## 2024-01-04 DIAGNOSIS — G4700 Insomnia, unspecified: Secondary | ICD-10-CM | POA: Diagnosis not present

## 2024-01-04 DIAGNOSIS — R188 Other ascites: Secondary | ICD-10-CM | POA: Diagnosis not present

## 2024-01-04 DIAGNOSIS — F331 Major depressive disorder, recurrent, moderate: Secondary | ICD-10-CM | POA: Diagnosis not present

## 2024-01-04 DIAGNOSIS — K7460 Unspecified cirrhosis of liver: Secondary | ICD-10-CM | POA: Diagnosis not present

## 2024-01-04 DIAGNOSIS — K7682 Hepatic encephalopathy: Secondary | ICD-10-CM | POA: Diagnosis not present

## 2024-01-04 DIAGNOSIS — I9589 Other hypotension: Secondary | ICD-10-CM | POA: Diagnosis not present

## 2024-01-05 ENCOUNTER — Other Ambulatory Visit: Payer: Self-pay

## 2024-01-05 ENCOUNTER — Inpatient Hospital Stay (HOSPITAL_COMMUNITY)
Admission: EM | Admit: 2024-01-05 | Discharge: 2024-01-11 | DRG: 432 | Disposition: A | Discharge status: Skilled Nursing Facility | Source: Skilled Nursing Facility | Attending: Family Medicine | Admitting: Family Medicine

## 2024-01-05 DIAGNOSIS — E872 Acidosis, unspecified: Secondary | ICD-10-CM | POA: Diagnosis present

## 2024-01-05 DIAGNOSIS — Z7982 Long term (current) use of aspirin: Secondary | ICD-10-CM

## 2024-01-05 DIAGNOSIS — K7460 Unspecified cirrhosis of liver: Secondary | ICD-10-CM | POA: Diagnosis not present

## 2024-01-05 DIAGNOSIS — G4730 Sleep apnea, unspecified: Secondary | ICD-10-CM | POA: Diagnosis present

## 2024-01-05 DIAGNOSIS — K7290 Hepatic failure, unspecified without coma: Secondary | ICD-10-CM | POA: Diagnosis present

## 2024-01-05 DIAGNOSIS — F411 Generalized anxiety disorder: Secondary | ICD-10-CM | POA: Diagnosis present

## 2024-01-05 DIAGNOSIS — L89152 Pressure ulcer of sacral region, stage 2: Secondary | ICD-10-CM | POA: Diagnosis not present

## 2024-01-05 DIAGNOSIS — E440 Moderate protein-calorie malnutrition: Secondary | ICD-10-CM | POA: Diagnosis not present

## 2024-01-05 DIAGNOSIS — Z789 Other specified health status: Secondary | ICD-10-CM

## 2024-01-05 DIAGNOSIS — K219 Gastro-esophageal reflux disease without esophagitis: Secondary | ICD-10-CM | POA: Diagnosis present

## 2024-01-05 DIAGNOSIS — K632 Fistula of intestine: Secondary | ICD-10-CM | POA: Insufficient documentation

## 2024-01-05 DIAGNOSIS — F17210 Nicotine dependence, cigarettes, uncomplicated: Secondary | ICD-10-CM | POA: Diagnosis present

## 2024-01-05 DIAGNOSIS — R188 Other ascites: Secondary | ICD-10-CM | POA: Diagnosis not present

## 2024-01-05 DIAGNOSIS — J90 Pleural effusion, not elsewhere classified: Secondary | ICD-10-CM | POA: Insufficient documentation

## 2024-01-05 DIAGNOSIS — R7989 Other specified abnormal findings of blood chemistry: Secondary | ICD-10-CM

## 2024-01-05 DIAGNOSIS — R54 Age-related physical debility: Secondary | ICD-10-CM | POA: Diagnosis present

## 2024-01-05 DIAGNOSIS — Z8249 Family history of ischemic heart disease and other diseases of the circulatory system: Secondary | ICD-10-CM

## 2024-01-05 DIAGNOSIS — E119 Type 2 diabetes mellitus without complications: Secondary | ICD-10-CM | POA: Diagnosis not present

## 2024-01-05 DIAGNOSIS — Z66 Do not resuscitate: Secondary | ICD-10-CM | POA: Diagnosis present

## 2024-01-05 DIAGNOSIS — E7800 Pure hypercholesterolemia, unspecified: Secondary | ICD-10-CM | POA: Diagnosis present

## 2024-01-05 DIAGNOSIS — Z5941 Food insecurity: Secondary | ICD-10-CM

## 2024-01-05 DIAGNOSIS — Z87442 Personal history of urinary calculi: Secondary | ICD-10-CM

## 2024-01-05 DIAGNOSIS — J948 Other specified pleural conditions: Secondary | ICD-10-CM | POA: Diagnosis present

## 2024-01-05 DIAGNOSIS — K7682 Hepatic encephalopathy: Secondary | ICD-10-CM | POA: Diagnosis not present

## 2024-01-05 DIAGNOSIS — I129 Hypertensive chronic kidney disease with stage 1 through stage 4 chronic kidney disease, or unspecified chronic kidney disease: Secondary | ICD-10-CM | POA: Diagnosis present

## 2024-01-05 DIAGNOSIS — R7402 Elevation of levels of lactic acid dehydrogenase (LDH): Secondary | ICD-10-CM | POA: Diagnosis not present

## 2024-01-05 DIAGNOSIS — Z5948 Other specified lack of adequate food: Secondary | ICD-10-CM

## 2024-01-05 DIAGNOSIS — N1832 Chronic kidney disease, stage 3b: Secondary | ICD-10-CM | POA: Diagnosis present

## 2024-01-05 DIAGNOSIS — I8511 Secondary esophageal varices with bleeding: Secondary | ICD-10-CM | POA: Diagnosis not present

## 2024-01-05 DIAGNOSIS — Z833 Family history of diabetes mellitus: Secondary | ICD-10-CM

## 2024-01-05 DIAGNOSIS — F32A Depression, unspecified: Secondary | ICD-10-CM | POA: Diagnosis present

## 2024-01-05 DIAGNOSIS — J918 Pleural effusion in other conditions classified elsewhere: Secondary | ICD-10-CM | POA: Diagnosis present

## 2024-01-05 DIAGNOSIS — K767 Hepatorenal syndrome: Secondary | ICD-10-CM | POA: Diagnosis present

## 2024-01-05 DIAGNOSIS — Z981 Arthrodesis status: Secondary | ICD-10-CM

## 2024-01-05 DIAGNOSIS — L98432 Non-pressure chronic ulcer of abdomen with fat layer exposed: Secondary | ICD-10-CM | POA: Diagnosis not present

## 2024-01-05 DIAGNOSIS — D72829 Elevated white blood cell count, unspecified: Secondary | ICD-10-CM | POA: Diagnosis present

## 2024-01-05 DIAGNOSIS — Z79899 Other long term (current) drug therapy: Secondary | ICD-10-CM

## 2024-01-05 DIAGNOSIS — K652 Spontaneous bacterial peritonitis: Secondary | ICD-10-CM | POA: Diagnosis present

## 2024-01-05 DIAGNOSIS — Z8711 Personal history of peptic ulcer disease: Secondary | ICD-10-CM

## 2024-01-05 DIAGNOSIS — Z515 Encounter for palliative care: Secondary | ICD-10-CM

## 2024-01-05 DIAGNOSIS — E8770 Fluid overload, unspecified: Secondary | ICD-10-CM | POA: Diagnosis present

## 2024-01-05 LAB — CBC
HCT: 35.1 % — ABNORMAL LOW (ref 39.0–52.0)
Hemoglobin: 11.9 g/dL — ABNORMAL LOW (ref 13.0–17.0)
MCH: 32.4 pg (ref 26.0–34.0)
MCHC: 33.9 g/dL (ref 30.0–36.0)
MCV: 95.6 fL (ref 80.0–100.0)
Platelets: 245 K/uL (ref 150–400)
RBC: 3.67 MIL/uL — ABNORMAL LOW (ref 4.22–5.81)
RDW: 16.5 % — ABNORMAL HIGH (ref 11.5–15.5)
WBC: 13 K/uL — ABNORMAL HIGH (ref 4.0–10.5)
nRBC: 0 % (ref 0.0–0.2)

## 2024-01-05 LAB — COMPREHENSIVE METABOLIC PANEL WITH GFR
ALT: 22 U/L (ref 0–44)
AST: 53 U/L — ABNORMAL HIGH (ref 15–41)
Albumin: 2.7 g/dL — ABNORMAL LOW (ref 3.5–5.0)
Alkaline Phosphatase: 50 U/L (ref 38–126)
Anion gap: 13 (ref 5–15)
BUN: 26 mg/dL — ABNORMAL HIGH (ref 6–20)
CO2: 24 mmol/L (ref 22–32)
Calcium: 8.7 mg/dL — ABNORMAL LOW (ref 8.9–10.3)
Chloride: 97 mmol/L — ABNORMAL LOW (ref 98–111)
Creatinine, Ser: 1.9 mg/dL — ABNORMAL HIGH (ref 0.61–1.24)
GFR, Estimated: 40 mL/min — ABNORMAL LOW (ref >60)
Glucose, Bld: 107 mg/dL — ABNORMAL HIGH (ref 70–99)
Potassium: 4.2 mmol/L (ref 3.5–5.1)
Sodium: 134 mmol/L — ABNORMAL LOW (ref 135–145)
Total Bilirubin: 2 mg/dL — ABNORMAL HIGH (ref 0.0–1.2)
Total Protein: 7.3 g/dL (ref 6.5–8.1)

## 2024-01-05 LAB — LIPASE, BLOOD: Lipase: 50 U/L (ref 11–51)

## 2024-01-05 NOTE — ED Triage Notes (Signed)
 Pt coming in from [facility] reporting that he was recently seen at [facility] for a paracentesis and now has a possible fistula. Pt is having abdominal pain 8/10.

## 2024-01-06 ENCOUNTER — Emergency Department (HOSPITAL_COMMUNITY)

## 2024-01-06 ENCOUNTER — Inpatient Hospital Stay (HOSPITAL_COMMUNITY)

## 2024-01-06 ENCOUNTER — Encounter (HOSPITAL_COMMUNITY): Payer: Self-pay | Admitting: Family Medicine

## 2024-01-06 DIAGNOSIS — R7402 Elevation of levels of lactic acid dehydrogenase (LDH): Secondary | ICD-10-CM | POA: Diagnosis not present

## 2024-01-06 DIAGNOSIS — K767 Hepatorenal syndrome: Secondary | ICD-10-CM | POA: Diagnosis not present

## 2024-01-06 DIAGNOSIS — J948 Other specified pleural conditions: Secondary | ICD-10-CM | POA: Insufficient documentation

## 2024-01-06 DIAGNOSIS — Z981 Arthrodesis status: Secondary | ICD-10-CM | POA: Diagnosis not present

## 2024-01-06 DIAGNOSIS — J90 Pleural effusion, not elsewhere classified: Secondary | ICD-10-CM | POA: Diagnosis not present

## 2024-01-06 DIAGNOSIS — E872 Acidosis, unspecified: Secondary | ICD-10-CM | POA: Diagnosis not present

## 2024-01-06 DIAGNOSIS — R7989 Other specified abnormal findings of blood chemistry: Secondary | ICD-10-CM | POA: Diagnosis not present

## 2024-01-06 DIAGNOSIS — R188 Other ascites: Secondary | ICD-10-CM | POA: Insufficient documentation

## 2024-01-06 DIAGNOSIS — K7682 Hepatic encephalopathy: Secondary | ICD-10-CM | POA: Diagnosis not present

## 2024-01-06 DIAGNOSIS — Z7982 Long term (current) use of aspirin: Secondary | ICD-10-CM | POA: Diagnosis not present

## 2024-01-06 DIAGNOSIS — F411 Generalized anxiety disorder: Secondary | ICD-10-CM | POA: Diagnosis not present

## 2024-01-06 DIAGNOSIS — K7290 Hepatic failure, unspecified without coma: Secondary | ICD-10-CM | POA: Diagnosis present

## 2024-01-06 DIAGNOSIS — K219 Gastro-esophageal reflux disease without esophagitis: Secondary | ICD-10-CM | POA: Diagnosis not present

## 2024-01-06 DIAGNOSIS — K7460 Unspecified cirrhosis of liver: Secondary | ICD-10-CM | POA: Diagnosis present

## 2024-01-06 DIAGNOSIS — Z66 Do not resuscitate: Secondary | ICD-10-CM | POA: Diagnosis not present

## 2024-01-06 DIAGNOSIS — Z515 Encounter for palliative care: Secondary | ICD-10-CM | POA: Diagnosis not present

## 2024-01-06 DIAGNOSIS — Z833 Family history of diabetes mellitus: Secondary | ICD-10-CM | POA: Diagnosis not present

## 2024-01-06 DIAGNOSIS — Z79899 Other long term (current) drug therapy: Secondary | ICD-10-CM | POA: Diagnosis not present

## 2024-01-06 DIAGNOSIS — E8770 Fluid overload, unspecified: Secondary | ICD-10-CM | POA: Diagnosis not present

## 2024-01-06 DIAGNOSIS — Z7189 Other specified counseling: Secondary | ICD-10-CM | POA: Diagnosis not present

## 2024-01-06 DIAGNOSIS — F17210 Nicotine dependence, cigarettes, uncomplicated: Secondary | ICD-10-CM | POA: Diagnosis not present

## 2024-01-06 DIAGNOSIS — N1832 Chronic kidney disease, stage 3b: Secondary | ICD-10-CM | POA: Diagnosis not present

## 2024-01-06 DIAGNOSIS — K652 Spontaneous bacterial peritonitis: Secondary | ICD-10-CM | POA: Diagnosis not present

## 2024-01-06 DIAGNOSIS — E7800 Pure hypercholesterolemia, unspecified: Secondary | ICD-10-CM | POA: Diagnosis not present

## 2024-01-06 DIAGNOSIS — Z8249 Family history of ischemic heart disease and other diseases of the circulatory system: Secondary | ICD-10-CM | POA: Diagnosis not present

## 2024-01-06 DIAGNOSIS — F32A Depression, unspecified: Secondary | ICD-10-CM | POA: Diagnosis not present

## 2024-01-06 DIAGNOSIS — I10 Essential (primary) hypertension: Secondary | ICD-10-CM | POA: Diagnosis not present

## 2024-01-06 DIAGNOSIS — J918 Pleural effusion in other conditions classified elsewhere: Secondary | ICD-10-CM | POA: Diagnosis not present

## 2024-01-06 DIAGNOSIS — I129 Hypertensive chronic kidney disease with stage 1 through stage 4 chronic kidney disease, or unspecified chronic kidney disease: Secondary | ICD-10-CM | POA: Diagnosis not present

## 2024-01-06 DIAGNOSIS — R54 Age-related physical debility: Secondary | ICD-10-CM | POA: Diagnosis not present

## 2024-01-06 LAB — LACTATE DEHYDROGENASE, PLEURAL OR PERITONEAL FLUID
LD, Fluid: 47 U/L — ABNORMAL HIGH (ref 3–23)
LD, Fluid: 42 U/L — ABNORMAL HIGH (ref 3–23)

## 2024-01-06 LAB — GLUCOSE, PLEURAL OR PERITONEAL FLUID
Glucose, Fluid: 93 mg/dL
Glucose, Fluid: 98 mg/dL

## 2024-01-06 LAB — PROTEIN, PLEURAL OR PERITONEAL FLUID
Total protein, fluid: <3 g/dL
Total protein, fluid: <3 g/dL

## 2024-01-06 LAB — I-STAT CG4 LACTIC ACID, ED
Lactic Acid, Venous: 3 mmol/L (ref 0.5–1.9)
Lactic Acid, Venous: 1.9 mmol/L (ref 0.5–1.9)

## 2024-01-06 LAB — BODY FLUID CELL COUNT WITH DIFFERENTIAL
Eos, Fluid: 0 %
Eos, Fluid: 1 %
Lymphs, Fluid: 39 %
Lymphs, Fluid: 56 %
Monocyte-Macrophage-Serous Fluid: 51 % (ref 50–90)
Monocyte-Macrophage-Serous Fluid: 32 % — ABNORMAL LOW (ref 50–90)
Neutrophil Count, Fluid: 10 % (ref 0–25)
Neutrophil Count, Fluid: 11 % (ref 0–25)
Total Nucleated Cell Count, Fluid: 364 uL (ref 0–1000)
Total Nucleated Cell Count, Fluid: 352 uL (ref 0–1000)

## 2024-01-06 LAB — ALBUMIN, PLEURAL OR PERITONEAL FLUID
Albumin, Fluid: <1.5 g/dL
Albumin, Fluid: <1.5 g/dL

## 2024-01-06 LAB — PROTEIN, TOTAL: Total Protein: 6.8 g/dL (ref 6.5–8.1)

## 2024-01-06 MED ORDER — NICOTINE 14 MG/24HR TD PT24
14.0000 mg | MEDICATED_PATCH | Freq: Every day | TRANSDERMAL | Status: DC
  Filled 2024-01-06 (×5): qty 1

## 2024-01-06 MED ORDER — LIDOCAINE-EPINEPHRINE 1 %-1:100000 IJ SOLN
20.0000 mL | Freq: Once | INTRAMUSCULAR | Status: AC
  Administered 2024-01-06: 10 mL via INTRADERMAL

## 2024-01-06 MED ORDER — OXYCODONE HCL 5 MG PO TABS
5.0000 mg | ORAL_TABLET | ORAL | Status: DC | PRN
  Administered 2024-01-06 – 2024-01-11 (×7): 5 mg via ORAL
  Filled 2024-01-06 (×7): qty 1

## 2024-01-06 MED ORDER — SODIUM CHLORIDE 0.9 % IV SOLN: 1.0000 g | Freq: Once | INTRAVENOUS | Status: DC

## 2024-01-06 MED ORDER — MIDODRINE HCL 5 MG PO TABS
30.0000 mg | ORAL_TABLET | Freq: Three times a day (TID) | ORAL | Status: DC
  Administered 2024-01-06 – 2024-01-11 (×15): 30 mg via ORAL
  Filled 2024-01-06 (×15): qty 6

## 2024-01-06 MED ORDER — IOHEXOL 350 MG/ML SOLN
75.0000 mL | Freq: Once | INTRAVENOUS | Status: AC | PRN
  Administered 2024-01-06: 75 mL via INTRAVENOUS

## 2024-01-06 MED ORDER — SODIUM CHLORIDE 0.9 % IV BOLUS
1000.0000 mL | Freq: Once | INTRAVENOUS | Status: AC
  Administered 2024-01-06: 1000 mL via INTRAVENOUS

## 2024-01-06 MED ORDER — LIDOCAINE-EPINEPHRINE 1 %-1:100000 IJ SOLN
INTRAMUSCULAR | Status: AC
  Filled 2024-01-06: qty 1

## 2024-01-06 MED ORDER — ESCITALOPRAM OXALATE 10 MG PO TABS
5.0000 mg | ORAL_TABLET | Freq: Every day | ORAL | Status: DC
  Administered 2024-01-06 – 2024-01-11 (×6): 5 mg via ORAL
  Filled 2024-01-06 (×6): qty 1

## 2024-01-06 MED ORDER — RIFAXIMIN 550 MG PO TABS
550.0000 mg | ORAL_TABLET | Freq: Two times a day (BID) | ORAL | Status: DC
  Administered 2024-01-06 – 2024-01-11 (×11): 550 mg via ORAL
  Filled 2024-01-06 (×12): qty 1

## 2024-01-06 MED ORDER — LACTULOSE 10 GM/15ML PO SOLN
20.0000 g | Freq: Three times a day (TID) | ORAL | Status: DC
  Administered 2024-01-06 – 2024-01-07 (×4): 20 g via ORAL
  Filled 2024-01-06 (×4): qty 30

## 2024-01-06 MED ORDER — SODIUM CHLORIDE 0.9 % IV SOLN
2.0000 g | INTRAVENOUS | Status: AC
  Administered 2024-01-06 – 2024-01-10 (×5): 2 g via INTRAVENOUS
  Filled 2024-01-06 (×5): qty 20

## 2024-01-06 MED ORDER — LIDOCAINE-EPINEPHRINE 1 %-1:100000 IJ SOLN
INTRAMUSCULAR | Status: AC
Start: 2024-01-06 — End: 2024-01-06
  Filled 2024-01-06: qty 1

## 2024-01-06 MED ORDER — PANTOPRAZOLE SODIUM 40 MG PO TBEC
40.0000 mg | DELAYED_RELEASE_TABLET | Freq: Two times a day (BID) | ORAL | Status: DC
  Administered 2024-01-06 – 2024-01-11 (×10): 40 mg via ORAL
  Filled 2024-01-06 (×10): qty 1

## 2024-01-06 NOTE — Assessment & Plan Note (Addendum)
 Patient with chronic non-alcoholic cirrhosis, presenting with decompensation at this time. MELD score 23. Initial mild-to-moderate ascites and large R pleural effusion seen on imaging, now s/p thoracentesis with IR. Patient afebrile with mild leukocytosis, initially elevated lactic acid, and abdominal pain, concerning for possible SBP. Per chart review, patient most recently admitted to [facility] (10/28-11/5/25) for similar presentation, received empiric treatment for SBP (peritoneal culture was negative though abx were given prior to collection), underwent multiple paracenteses, and was started on oral SBP prophylaxis (ciprofloxacin) upon discharge.    -Admit to FMTS with attending Dr Anders, Progressive floor  -Appreciate IR consult: -S/p thoracentesis with 1.5 L milky fluid removed -Plan for diagnostic paracentesis today, then therapeutic tomorrow  -Begin CTX 2mg /day for SBP concern after diagnostic paracentesis  -Follow up pleural and peritoneal fluid workup and cultures  -Spoke with Dr Kristie of GI who did not recommend any GI intervention at this time  -Consult to Palliative team placed -Continue home midodrine 30 TID -Continue home lactulose  TID, rifaximin 550 BID -Vitals per floor  -Continuous cardiac monitoring  -AM CBC, CMP -AM CXR

## 2024-01-06 NOTE — ED Notes (Signed)
 FM paged 08:45am

## 2024-01-06 NOTE — Procedures (Signed)
 PROCEDURE SUMMARY:  Successful US  guided paracentesis from RLQ.  Yielded 100 ml of cloudy yellow fluid.  No immediate complications.  Pt tolerated well.   Specimen sent for labs.  EBL < 2 mL  Warren JONELLE Dais, NP 01/06/2024 2:59 PM

## 2024-01-06 NOTE — H&P (Addendum)
 [facility] Admission History and Physical Service Pager: [phone]  Patient name: [facility] Medical record number: [id number] Date of Birth: October 22, 1965 Age: 58 y.o. Gender: male  Primary Care Provider: Patient, No Pcp Per Consultants: IR, [facility] Code Status: DNR which was confirmed by patient  Preferred Emergency Contact:  Name Relation Home Work  [facility] Spouse [phone]    Chief Complaint: Abdominal pain   Differential and Medical Decision Making:  James [facility] is a 58 y.o. male presenting with abdominal pain. Most likely decompensated cirrhosis given known history and fluid overload seen on imaging, including pleural effusion and ascites.  Assessment & Plan Decompensated cirrhosis (HCC) Pleural effusion Ascites Patient with chronic non-alcoholic cirrhosis, presenting with decompensation at this time. MELD score 23. Initial mild-to-moderate ascites and large R pleural effusion seen on imaging, now s/p thoracentesis with IR. Patient afebrile with mild leukocytosis, initially elevated lactic acid, and abdominal pain, concerning for possible SBP. Per chart review, patient most recently admitted to [facility] (10/28-11/5/25) for similar presentation, received empiric treatment for SBP (peritoneal culture was negative though abx were given prior to collection), underwent multiple paracenteses, and was started on oral SBP prophylaxis (ciprofloxacin) upon discharge.    -Admit to [facility] with attending James [facility]  -Appreciate IR consult: -S/p thoracentesis with 1.5 L milky fluid removed -Plan for diagnostic paracentesis today, then therapeutic tomorrow  -Begin CTX 2mg /day for SBP concern after diagnostic paracentesis  -Follow up pleural and peritoneal fluid workup and cultures  -Spoke with [facility] who did not recommend any [facility] intervention at this time  -Consult to [facility] team placed -Continue home midodrine 30 TID -Continue home  lactulose  TID, rifaximin 550 BID -Vitals per [facility]  -Continuous cardiac monitoring  -AM CBC, CMP -AM CXR Hepatorenal syndrome (HCC) Hx of hepatorenal syndrome. Cr on admission of 1.9, appears around recent baseline.  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 Upon exam today, site appears non-infectious and not actively draining. No fistula mentioned on today's CTAP. Will continue to monitor at this time and consider Gen Surg consult as indicated.  GERD: Home Protonix BID  Mood: Home Lexapro 5 daily  Tobacco use: Nicotine patch while admitted   FEN/[facility]: Reg diet VTE Prophylaxis: SCDs  Disposition: Progressive  History of Present Illness:  [facility] is a 58 y.o. male presenting with abdominal pain.   Patient reports worsening abdominal pain over the past week. No nausea or vomiting. No bloody or black stools. Reports frequent BM due to taking lactulose . Unsure of the medications he takes daily, as they have been recently managed by nursing staff at [facility]. Reports no recent difficulty breathing, chest pain, or fevers.   In the ED, patient found to have mild-moderate ascites and large R pleural effusion, underwent aspiration with IR. Otherwise patient afebrile, with mild leukocytosis 13, baseline Cr 1.9, AST/ALT 53/22, and lactic acid 3.0 > 1.9.  Review Of Systems: Per HPI   Pertinent Past Medical History: Non-alcoholic liver cirrhosis  Depression, GAD HTN Hypercholesteremia Remainder reviewed in history tab.   Pertinent Past Surgical History: IR Paracentesis 9/29, 10/1, 12/04/2023 Lumbar fusion 2015 Remainder reviewed in history tab.   Pertinent Social History: Tobacco use: Current, about 0.5 ppd Alcohol use: None Other Substance use: None Lives at SNF currently   Pertinent Family History: Father: brain cancer, HTN  Remainder  reviewed in history tab.  Important Outpatient Medications: Aspirin 81 daily (recommended to be stopped on discharge from [facility] 12/11/23 though appears patient has been taking) Carvedilol  6.25 BID (paused since 12/11/23) Ciprofloxacin 500 daily  Lexapro 5 daily  Lasix  40 daily (paused since 12/11/23) Lactulose  20 TID Midodrine 10 TID Protonix 40 daily  Rifaximin 550 BID Spironolactone  100 daily (paused since 12/11/23) Tramadol 50 q6 prn Trazodone  50 at bedtime   Objective: BP 103/72   Pulse 72   Temp 97.7 F (36.5 C)   Resp 19   SpO2 97%  Exam: General: No acute distress. Resting comfortably in room. CV: Normal S1/S2. No extra heart sounds. Warm and well-perfused. Pulm: Breathing comfortably on room air. Decreased breath sounds of R base, with faint crackles. CTAB on L. No increased WOB. Abd: Normoactive BS. Soft, distended. Tender to palpation throughout, no rebound or guarding. 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  Skin:  Warm, dry.  Ext: Trace ankle edema bilaterally.  Psych: Pleasant and appropriate.    Labs:   CBC    Component Value Date/Time   WBC 13.0 (H) 01/05/2024 2245   RBC 3.67 (L) 01/05/2024 2245   HGB 11.9 (L) 01/05/2024 2245   HCT 35.1 (L) 01/05/2024 2245   PLT 245 01/05/2024 2245   MCV 95.6 01/05/2024 2245   MCH 32.4 01/05/2024 2245   MCHC 33.9 01/05/2024 2245   RDW 16.5 (H) 01/05/2024 2245   LYMPHSABS 1.5 11/23/2023 0903   MONOABS 1.1 (H) 11/23/2023 0903   EOSABS 0.0 11/23/2023 0903   BASOSABS 0.0 11/23/2023 0903   CMP     Component Value Date/Time   NA 134 (L) 01/05/2024 2245   NA 140 11/04/2013 1305   K 4.2 01/05/2024 2245   CL 97 (L) 01/05/2024 2245   CO2 24 01/05/2024 2245   GLUCOSE 107 (H) 01/05/2024 2245   BUN 26 (H) 01/05/2024 2245   BUN 25 (H) 11/04/2013 1305   CREATININE 1.90 (H) 01/05/2024 2245   CREATININE 1.42 (H) 08/22/2019 1210   CALCIUM 8.7 (L)  01/05/2024 2245   PROT 7.3 01/05/2024 2245   PROT 6.9 11/04/2013 1305   ALBUMIN  2.7 (L) 01/05/2024 2245   ALBUMIN  4.4 11/04/2013 1305   AST 53 (H) 01/05/2024 2245   AST 22 08/22/2019 1210   ALT 22 01/05/2024 2245   ALT 25 08/22/2019 1210   ALKPHOS 50 01/05/2024 2245   BILITOT 2.0 (H) 01/05/2024 2245   BILITOT 0.7 08/22/2019 1210   GFRNONAA 40 (L) 01/05/2024 2245   GFRNONAA 55 (L) 08/22/2019 1210    Lactic acid: 3.0 > 1.9  Lipase 50  Imaging Studies Performed: CTAP: Large right pleural effusion with near complete collapse of R upper and lower lobes. Cirrhotic liver with mild-moderate ascites. Cholelithiasis without gallbladder distention. Bilateral nonobstructive nephrolithiasis.   CXR s/p thoracentesis: Interval development of probable RLL atelectasis with associated pleural effusion. No pneumothorax.   Diona Perkins, MD 01/06/2024, 9:03 AM PGY-2, [facility]  FPTS Intern pager: [phone], text pages welcome Secure chat group [facility] Teaching Service

## 2024-01-06 NOTE — Procedures (Signed)
 PROCEDURE SUMMARY:  Successful US  guided right thoracentesis. Yielded 1.5 L of cloudy yellow fluid. Pt tolerated procedure well. No immediate complications.  Specimen sent for labs. CXR ordered; no post-procedure pneumothorax identified  EBL < 2 mL  Warren JONELLE Dais, NP 01/06/2024 12:30 PM

## 2024-01-06 NOTE — Consult Note (Signed)
 Consultation Note Date: 01/07/2024   Patient Name: James [facility]  DOB: 1965/11/01  MRN: [id number]  Age / Sex: 58 y.o., male  PCP: Patient, No Pcp Per Referring Physician: Anders Otto DASEN, MD  Reason for Consultation: Establishing goals of care  HPI/Patient Profile: 58 y.o. male  with past medical history of non-alcoholic decompensated cirrhosis, hepatorenal syndrome, esophageal varices, gastric ulcer, portal HTN, HLD, nephrolithiasis admitted on 01/05/2024 with abd pain with fistula following recent paracentesis, decompensated cirrhosis, ascites, pleural effusion. Recent admission to [facility] for paracentesis. Was previously at [facility] for rehab  -[facility]. Seen by our palliative team multiple times during last admission here in early October. PMT consulted for GOC.  Clinical Assessment and Goals of Care: Consult received and chart review completed. Noted recent palliative visits. Reviewed completed MOST form: DNR, limited interventions, determine use/limitation of antibiotics when infection occurs, IVF for trial period, no feeding tube. Recent admission to [facility] for paracentesis.   Assessed the patient and then met with him  to discuss diagnosis prognosis, GOC, EOL wishes, disposition and options.  I introduced Palliative Medicine as specialized medical care for people living with serious illness. It focuses on providing relief from the symptoms and stress of a serious illness. The goal is to improve quality of life for both the patient and the family. He recalls previous palliative visits.   Participate in life review. Married to James [facility] James also having some health challenges. Has 2 sons - Zachary and Toribio. Their wives are involved in his care - he depends very much on Daniel's wife [facility] to assist with decision making. He has 4 grandchildren aged 32-13. He tells me about his beloved Tree Surgeon.    He tells me he had some pain last night - 5 mg oxycodone  was effective. Has not needed any more. Not currently in pain, just feeling tired.   We review his time since last meeting with palliative - has been at rehab, felt that was going well, felt he was getting stronger. He tells me the physical therapists expressed to him that he had significantly improved in function.   Review goals of care from last discussion. At that time goals were to continue current treatment, try to get stronger, DNR/DNI. He tells me his goals remain the same. He is open to continuing current treatment. He is open to rehospitalization as needed. Hopes to return to rehab and hopeful for improvement in function. He is hopeful to discharge to ALF with his wife. He understands he cannot return home as there is not enough support in the home.    We discussed patient's current illness and what it means in the larger context of patient's on-going co-morbidities.   Discussed with patient the importance of continued conversation with family and the medical providers regarding overall plan of care and treatment options, ensuring decisions are within the context of the patient's values and GOCs.    He tells me if he couldn't make decisions for himself he would want his DIL [facility] to  make decisions for him - he thinks he made her HCPOA last admission - we review this is not on chart and will need to obtain copies.   Palliative Care services outpatient were explained and offered. He was referred to outpatient palliative last admission - he does not recall seeing anyone with palliative outpatient, open to referring again.   Following meeting with Mr. Denio I called his DIL [facility]. Reviewed the above with her. She agrees. All questions answered. She is also agreeable to outpatient palliative follow up. Hospice briefly discussion - reviewed cannot receive rehab and hospice together - goals are for rehab at this time. Outpatient  palliative can help with determining when hospice is appropriate. She shares she is not sure what happened to [facility] - remembers it being completed as [facility] but hasn't seen it since despite asking for a copy. We review it is not on file in electronic record. Will request redo of document.    Questions and concerns were addressed. The family was encouraged to call with questions or concerns.   Primary Decision Maker PATIENT - DIL Courtney as HCPOA   SUMMARY OF RECOMMENDATIONS   Open to ongoing medical care, open to rehospitalization if needed Hopes to return to rehab and get stronger with ultimate goal of moving into ALF w/ wife Agrees to outpatient palliative following - CSW follow up and his facility has their own palliative, requested they follow patient Requested completion of AD - to name DIL [facility] as HCPOA  Code Status/Advance Care Planning: DNR  Symptom Management:  Agree with PRN oxycodone    Discharge Planning: Skilled Nursing Facility for rehab with Palliative care service follow-up      Primary Diagnoses: Present on Admission:  Decompensated cirrhosis (HCC)   I have reviewed the medical record, interviewed the patient and family, and examined the patient. The following aspects are pertinent.  Past Medical History:  Diagnosis Date   Arthritis    Depression    Gout    Hypercholesterolemia    Hypertension    on meds   Kidney damage    Kidney stone    Obesity    Sleep apnea    uses CPAP   Social History   Socioeconomic History   Marital status: Married    Spouse name: Not on file   Number of children: 2   Years of education: Not on file   Highest education level: Not on file  Occupational History   Not on file  Tobacco Use   Smoking status: Every Day    Current packs/day: 1.00    Average packs/day: 1 pack/day for 43.9 years (43.9 ttl pk-yrs)    Types: Cigarettes    Start date: 1982   Smokeless tobacco: Former    Types: Chew, Snuff  Vaping Use    Vaping status: Never Used  Substance and Sexual Activity   Alcohol use: Not Currently   Drug use: No   Sexual activity: Not Currently  Other Topics Concern   Not on file  Social History Narrative   Patient is married with 2 children.   Patient is right handed.   Patient has high school education.   Patient drinks 3-4 16 oz daily.   Social Drivers of Corporate Investment Banker Strain: Not on file  Food Insecurity: Food Insecurity Present (01/06/2024)   Hunger Vital Sign    Worried About Running Out of Food in the Last Year: Sometimes true    Ran Out of Food in the Last  Year: Sometimes true  Transportation Needs: No Transportation Needs (01/06/2024)   PRAPARE - Administrator, Civil Service (Medical): No    Lack of Transportation (Non-Medical): No  Physical Activity: Not on file  Stress: Not on file  Social Connections: Not on file   Family History  Problem Relation Age of Onset   Cancer Father        BRAIN/PROSTATE/SKIN   Hypertension Father    Cancer Paternal Grandfather    Heart failure Maternal Grandmother    Diabetes Maternal Grandmother    Hypertension Maternal Grandmother    Hypertension Maternal Grandfather    Scheduled Meds:  escitalopram  5 mg Oral Daily   lactulose   20 g Oral TID   midodrine  30 mg Oral TID WC   nicotine  14 mg Transdermal Daily   pantoprazole  40 mg Oral BID AC   rifaximin  550 mg Oral BID   Continuous Infusions:  cefTRIAXone  (ROCEPHIN )  IV Stopped (01/06/24 1057)   PRN Meds:. No Known Allergies Review of Systems  Constitutional:  Positive for fatigue.    Physical Exam Constitutional:      General: He is not in acute distress.    Appearance: He is ill-appearing.  Pulmonary:     Effort: Pulmonary effort is normal.  Skin:    General: Skin is warm and dry.  Neurological:     Mental Status: He is alert and oriented to person, place, and time.     Vital Signs: BP 108/69   Pulse 72   Temp 97.8 F (36.6 C)  (Oral)   Resp 20   SpO2 94%  Pain Scale: 0-10   Pain Score: 5    SpO2: SpO2: 94 % O2 Device:SpO2: 94 % O2 Flow Rate: .   IO: Intake/output summary: No intake or output data in the 24 hours ending 01/06/24 1220  LBM:   Baseline Weight:   Most recent weight:       Palliative Assessment/Data: PPS 50%    Time Total: 70 minutes Greater than 50%  of this time was spent counseling and coordinating care related to the above assessment and plan.  Signed by: Tobey Jama Barnacle, DNP, AGNP-C Palliative Medicine Team Team Phone # [phone]  Pager # [phone]

## 2024-01-06 NOTE — Plan of Care (Signed)

## 2024-01-06 NOTE — ED Provider Notes (Signed)
  Physical Exam  BP 103/72   Pulse 72   Temp 97.7 F (36.5 C)   Resp 19   SpO2 97%   Physical Exam Vitals and nursing note reviewed.  Constitutional:      General: He is not in acute distress.    Appearance: Normal appearance. He is not ill-appearing.  HENT:     Head: Normocephalic and atraumatic.  Eyes:     General:        Right eye: No discharge.        Left eye: No discharge.     Extraocular Movements: Extraocular movements intact.     Conjunctiva/sclera: Conjunctivae normal.  Cardiovascular:     Rate and Rhythm: Normal rate and regular rhythm.     Pulses: Normal pulses.  Pulmonary:     Effort: Pulmonary effort is normal.     Comments: Decreased lung sounds to left side. Abdominal:     Palpations: Abdomen is soft.  Musculoskeletal:        General: No swelling, deformity or signs of injury.     Cervical back: Normal range of motion. No rigidity.     Right lower leg: No edema.     Left lower leg: No edema.  Skin:    General: Skin is warm and dry.     Findings: No bruising, erythema or lesion.  Neurological:     General: No focal deficit present.     Mental Status: He is alert and oriented to person, place, and time. Mental status is at baseline.     Sensory: No sensory deficit.     Motor: No weakness.  Psychiatric:        Mood and Affect: Mood normal.     Procedures  Procedures  ED Course / MDM   Clinical Course as of 01/06/24 0712  Wed Jan 06, 2024  0630 Hx of cirrhosis. Came in today with concerns for mild abdominal pain but mainly concerned for fistula and persistent drainage after paracentesis. Was at [facility], refusing to be sent back. Had 12L taken off. Not complaining of SOB, acutely worse pain. CT scan shows large R pleural effusion. Provided Rocephin  empirically for concerns for possible SBP with initially having elevated lactic.  [CB]    Clinical Course User Index [CB] Beola Terrall RAMAN, PA-C   Medical Decision Making Amount and/or Complexity of  Data Reviewed Labs: ordered. Radiology: ordered.  Risk Prescription drug management. Decision regarding hospitalization.   See previous provider note.  Patient care transferred over from Leita Chancy, PA-C.  At time of handoff, requiring admission.  Had contacted Dr. Charlton, hospitalist, who requested to have teaching service paged with patient having admission in September.  Recently discharged on 10/17 by family medicine from hospital.  Placed orders for IR evaluation and PA Carlin Griffon stated that they would likely be able to undergo thoracentesis at some point today.  Orders were placed.   Will seek to admit to family medicine.  Spoke with  family medicine, Dr. Anders, who has accepted patient care at this time.       Beola Terrall RAMAN, PA-C 01/06/24 1001    Ula Prentice SAUNDERS, MD 01/06/24 [phone]

## 2024-01-06 NOTE — Plan of Care (Signed)
 FMTS Brief Progress Note  S: Went to evaluate patient at bedside with Dr. Coralee. Requesting some medication for his abd pain.  O: BP 116/80 (BP Location: Right Arm)   Pulse 63   Temp 98 F (36.7 C) (Oral)   Resp 20   SpO2 95%    General: NAD, resting in bed Resp: normal wob on RA Abdomen: Mild TTP  A/P: Decompensated Liver Cirrhosis w/ abd pain - Oxycodone  5 mg q4hr for pain - Caution NSAID with CKD - Orders reviewed. Labs for AM ordered, which was adjusted as needed.  - If condition changes, plan includes page family medicine teaching service.   Howell Lunger, DO 01/06/2024, 8:27 PM PGY-3,  Family Medicine Night Resident  Please page [phone] with questions.

## 2024-01-06 NOTE — Plan of Care (Signed)
 Palliative:  Went to consult with patient but gone for a procedure. Will re-attempt later.   No charge  Bernarda Kitty, NP Palliative Medicine Team Pager [phone] (Please see [email or web address] for schedule) Team Phone [phone]

## 2024-01-06 NOTE — Evaluation (Signed)
 Physical Therapy Evaluation Patient Details Name: James Downs MRN: [id number] DOB: 17-May-1965 Today's Date: 01/06/2024  History of Present Illness  58 year old male presenting 01/05/2024 with abdominal pain. Pt admitted for management of Decompensated cirrhosis and Ascites. Pt underwent thoracentesis and paracentesis on 11/12. PMHof non-alcoholic cirrhosis, HTN, HLD, and nephrolithiasis.  Clinical Impression  Pt presents to PT with deficits in strength, power, gait, balance, functional mobility. Pt reports being limited by fatigue from lack of rest recently. Pt requires assistance to stand due to lack of LE power, PT also notes BLE tremors/shaking in standing and with short bout of ambulation. Pt remains at a high risk for falls due to impaired strength and endurance. Patient will benefit from continued inpatient follow up therapy, <3 hours/day.        If plan is discharge home, recommend the following: A little help with walking and/or transfers;Assistance with cooking/housework;A little help with bathing/dressing/bathroom;Assist for transportation;Help with stairs or ramp for entrance   Can travel by private vehicle   Yes    Equipment Recommendations None recommended by PT  Recommendations for Other Services       Functional Status Assessment Patient has had a recent decline in their functional status and demonstrates the ability to make significant improvements in function in a reasonable and predictable amount of time.     Precautions / Restrictions Precautions Precautions: Fall Recall of Precautions/Restrictions: Intact Restrictions Weight Bearing Restrictions Per Provider Order: No      Mobility  Bed Mobility Overal bed mobility: Needs Assistance Bed Mobility: Supine to Sit, Sit to Supine     Supine to sit: Supervision Sit to supine: Supervision        Transfers Overall transfer level: Needs assistance Equipment used: Rolling walker (2 wheels) Transfers:  Sit to/from Stand Sit to Stand: Min assist           General transfer comment: cues to increase anterior weight shift    Ambulation/Gait Ambulation/Gait assistance: Contact guard assist Gait Distance (Feet): 5 Feet Assistive device: Rolling walker (2 wheels) Gait Pattern/deviations: Step-to pattern Gait velocity: reduced Gait velocity interpretation: <1.31 ft/sec, indicative of household ambulator   General Gait Details: slowed step-to gait, BLE tremors/shaking noted during ambulation  Stairs            Wheelchair Mobility     Tilt Bed    Modified Rankin (Stroke Patients Only)       Balance Overall balance assessment: Needs assistance Sitting-balance support: No upper extremity supported, Feet supported Sitting balance-Leahy Scale: Good     Standing balance support: Bilateral upper extremity supported, Reliant on assistive device for balance Standing balance-Leahy Scale: Poor                               Pertinent Vitals/Pain Pain Assessment Pain Assessment: Faces Faces Pain Scale: Hurts even more Pain Location: abdomen Pain Descriptors / Indicators: Sore Pain Intervention(s): Monitored during session    Home Living Family/patient expects to be discharged to:: Skilled nursing facility Living Arrangements: Spouse/significant other                 Additional Comments: pt was living at home with his spouse prior to hospital admission in September, since he has been in an out of rehab and the hospital    Prior Function Prior Level of Function : Needs assist             Mobility Comments: prior to september  the pt was independent without DME, recently in rehab the pt has been working on ambulating with a RW ADLs Comments: independent prior to september     Extremity/Trunk Assessment   Upper Extremity Assessment Upper Extremity Assessment: Generalized weakness    Lower Extremity Assessment Lower Extremity Assessment:  Generalized weakness    Cervical / Trunk Assessment Cervical / Trunk Assessment: Kyphotic  Communication   Communication Communication: No apparent difficulties    Cognition Arousal: Alert Behavior During Therapy: WFL for tasks assessed/performed   PT - Cognitive impairments: No family/caregiver present to determine baseline                       PT - Cognition Comments: appears WFL for session today Following commands: Intact       Cueing Cueing Techniques: Verbal cues     General Comments General comments (skin integrity, edema, etc.): VSS on RA    Exercises     Assessment/Plan    PT Assessment Patient needs continued PT services  PT Problem List Decreased strength;Decreased activity tolerance;Decreased balance;Decreased mobility;Decreased knowledge of use of DME;Pain       PT Treatment Interventions DME instruction;Gait training;Functional mobility training;Therapeutic activities;Therapeutic exercise;Balance training;Neuromuscular re-education;Patient/family education;Stair training    PT Goals (Current goals can be found in the Care Plan section)  Acute Rehab PT Goals Patient Stated Goal: to return to rehab, regain strength and then return to his life PT Goal Formulation: With patient Time For Goal Achievement: 01/20/24 Potential to Achieve Goals: Fair    Frequency Min 2X/week     Co-evaluation               AM-PAC PT 6 Clicks Mobility  Outcome Measure Help needed turning from your back to your side while in a flat bed without using bedrails?: A Little Help needed moving from lying on your back to sitting on the side of a flat bed without using bedrails?: A Little Help needed moving to and from a bed to a chair (including a wheelchair)?: A Little Help needed standing up from a chair using your arms (e.g., wheelchair or bedside chair)?: A Little Help needed to walk in hospital room?: A Lot Help needed climbing 3-5 steps with a railing? :  Total 6 Click Score: 15    End of Session Equipment Utilized During Treatment: Gait belt Activity Tolerance: Patient limited by fatigue Patient left: in bed;with call bell/phone within reach;with bed alarm set Nurse Communication: Mobility status PT Visit Diagnosis: Other abnormalities of gait and mobility (R26.89);Muscle weakness (generalized) (M62.81);Pain Pain - part of body:  (abdomen)    Time: 8266-8251 PT Time Calculation (min) (ACUTE ONLY): 15 min   Charges:   PT Evaluation $PT Eval Low Complexity: 1 Low   PT General Charges $$ ACUTE PT VISIT: 1 Visit         Bernardino JINNY Ruth, PT, DPT Acute Rehabilitation Office [phone]   Bernardino JINNY Ruth 01/06/2024, 5:56 PM

## 2024-01-06 NOTE — ED Notes (Addendum)
 Pt provided with urinal for urine specimen when he is able to go. Pt states he does not need to at this time. Pt provided with orange juice.

## 2024-01-06 NOTE — Assessment & Plan Note (Signed)
 Hx of hepatorenal syndrome. Cr on admission of 1.9, appears around recent baseline.  -Continue to monitor kidney function  -Avoid nephrotoxic agents

## 2024-01-06 NOTE — ED Notes (Signed)
 Pt to IR

## 2024-01-06 NOTE — ED Notes (Signed)
 Hold on CTX per Dr. Diona until further notice.

## 2024-01-06 NOTE — ED Provider Notes (Addendum)
 [place] EMERGENCY DEPARTMENT AT [facility] Provider Note   CSN: [id number] Arrival date & time: 01/05/24  2053     Patient presents with: Abdominal Pain   SAKIB NOGUEZ is a 58 y.o. male.   58 year old male with history of hypertension, hyperlipidemia, generalized anxiety, tobacco use, and history of decompensated liver cirrhosis with hepatorenal syndrome presents with complaint of abdominal pain, onset 1 week ago after his last paracentesis at [facility] where he was admitted from 10/28-11/5. Patient developed a fistula following the para and is frustrated to still have the fistula/drainage. He denies fevers, changes in bowel/bladder habits and reports good PO intake.        Prior to Admission medications   Medication Sig Start Date End Date Taking? Authorizing Provider  acetaminophen  (TYLENOL ) 325 MG tablet Take 2 tablets (650 mg total) by mouth every 8 (eight) hours as needed for mild pain (pain score 1-3) or fever (or Fever >/= 101). 12/11/23   Cleotilde Lukes, DO  aspirin EC 81 MG tablet Take 81 mg by mouth daily. Swallow whole.    [provider]  carvedilol  (COREG ) 6.25 MG tablet Take 6.25 mg by mouth 2 (two) times daily. Patient not taking: Reported on 11/23/2023    [provider]  escitalopram (LEXAPRO) 5 MG tablet Take 1 tablet (5 mg total) by mouth daily. 12/11/23   Cleotilde Lukes, DO  feeding supplement (ENSURE PLUS HIGH PROTEIN) LIQD Take 237 mLs by mouth 3 (three) times daily between meals. 12/11/23   Cleotilde Lukes, DO  furosemide  (LASIX ) 40 MG tablet Take 1 tablet (40 mg total) by mouth daily. Patient not taking: Reported on 11/23/2023 05/26/23 05/25/24  Caleen Burgess BROCKS, MD  lactulose  (CHRONULAC ) 10 GM/15ML solution Take 15 mLs (10 g total) by mouth 2 (two) times daily. 12/11/23   Cleotilde Lukes, DO  midodrine (PROAMATINE) 10 MG tablet Take 3 tablets (30 mg total) by mouth 3 (three) times daily with meals. 12/11/23   Cleotilde Lukes,  DO  Multiple Vitamin (MULTIVITAMIN WITH MINERALS) TABS tablet Take 1 tablet by mouth daily. 12/11/23   Cleotilde Lukes, DO  pantoprazole (PROTONIX) 40 MG tablet Take 1 tablet (40 mg total) by mouth 2 (two) times daily before a meal. 12/11/23   Cleotilde Lukes, DO  rifaximin (XIFAXAN) 550 MG TABS tablet Take 1 tablet (550 mg total) by mouth 2 (two) times daily. 12/11/23   Cleotilde Lukes, DO  spironolactone  (ALDACTONE ) 100 MG tablet Take 1 tablet (100 mg total) by mouth daily. Patient not taking: Reported on 11/23/2023 05/22/23   Caleen Burgess BROCKS, MD    Allergies: Patient has no known allergies.    Review of Systems Negative except as per HPI Updated Vital Signs BP 103/72   Pulse 72   Temp 97.7 F (36.5 C)   Resp 19   SpO2 97%   Physical Exam Vitals and nursing note reviewed.  Constitutional:      General: He is not in acute distress.    Appearance: He is well-developed. He is not diaphoretic.  HENT:     Head: Normocephalic and atraumatic.  Cardiovascular:     Rate and Rhythm: Normal rate and regular rhythm.  Pulmonary:     Effort: Pulmonary effort is normal.     Breath sounds: Examination of the right-upper field reveals decreased breath sounds. Examination of the right-middle field reveals decreased breath sounds. Examination of the right-lower field reveals decreased breath sounds. Decreased breath sounds present.  Abdominal:  Palpations: Abdomen is soft.     Tenderness: There is generalized abdominal tenderness.     Comments: Ostomy bag over left side abdomen, no drainage in bag.   Skin:    General: Skin is warm and dry.     Findings: No erythema or rash.  Neurological:     Mental Status: He is alert and oriented to person, place, and time.  Psychiatric:        Behavior: Behavior normal.     (all labs ordered are listed, but only abnormal results are displayed) Labs Reviewed  COMPREHENSIVE METABOLIC PANEL WITH GFR - Abnormal; Notable for the following components:       Result Value   Sodium 134 (*)    Chloride 97 (*)    Glucose, Bld 107 (*)    BUN 26 (*)    Creatinine, Ser 1.90 (*)    Calcium 8.7 (*)    Albumin  2.7 (*)    AST 53 (*)    Total Bilirubin 2.0 (*)    GFR, Estimated 40 (*)    All other components within normal limits  CBC - Abnormal; Notable for the following components:   WBC 13.0 (*)    RBC 3.67 (*)    Hemoglobin 11.9 (*)    HCT 35.1 (*)    RDW 16.5 (*)    All other components within normal limits  I-STAT CG4 LACTIC ACID, ED - Abnormal; Notable for the following components:   Lactic Acid, Venous 3.0 (*)    All other components within normal limits  CULTURE, BLOOD (ROUTINE X 2)  CULTURE, BLOOD (ROUTINE X 2)  LIPASE, BLOOD  URINALYSIS, ROUTINE W REFLEX MICROSCOPIC  I-STAT CG4 LACTIC ACID, ED    EKG: None  Radiology: CT CHEST ABDOMEN PELVIS W CONTRAST Result Date: 01/06/2024 EXAM: CT CHEST, ABDOMEN AND PELVIS WITH CONTRAST 01/06/2024 04:40:30 AM TECHNIQUE: CT of the chest, abdomen and pelvis was performed with the administration of 75 mL of iohexol  (OMNIPAQUE ) 350 MG/ML injection. Multiplanar reformatted images are provided for review. Automated exposure control, iterative reconstruction, and/or weight based adjustment of the mA/kV was utilized to reduce the radiation dose to as low as reasonably achievable. COMPARISON: CT of the abdomen and pelvis dated 11/23/2023. 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 FINDINGS: CHEST: MEDIASTINUM AND LYMPH NODES: There is calcific coronary artery disease present. The central airways are clear. No mediastinal, hilar or axillary lymphadenopathy. There is calcification within the aortic arch. LUNGS AND PLEURA: The right upper and lower lobes are virtually completely collapsed. There is a large right-sided pleural effusion. No pneumothorax. ABDOMEN AND PELVIS: LIVER: The liver is nodular and atrophic. GALLBLADDER AND  BILE DUCTS: There are numerous stones within the gallbladder, which is nondistended. No biliary ductal dilatation. SPLEEN: The spleen is mildly enlarged, measuring about 14 cm in length. PANCREAS: No acute abnormality. ADRENAL GLANDS: No acute abnormality. KIDNEYS, URETERS AND BLADDER: There are numerous nonobstructive calculi within the kidneys bilaterally. There are numerous small stones within the distal ureters bilaterally, but the ureters are nondistended. No hydronephrosis. No perinephric or periureteral stranding. Urinary bladder is unremarkable. GI AND BOWEL: Stomach demonstrates no acute abnormality. There is no bowel obstruction. There are bilateral inguinal hernias present, which contain ascites. REPRODUCTIVE ORGANS: No acute abnormality. PERITONEUM AND RETROPERITONEUM: There is mild-to-moderate ascites. No free air. VASCULATURE: The abdominal aorta is normal in caliber and densely calcified. There is calcification within the aortic arch. ABDOMINAL AND PELVIS  LYMPH NODES: No lymphadenopathy. BONES AND SOFT TISSUES: Postsurgical changes are noted within the lower thoracic spine on the left. The patient is status post left laminectomies at T8 and T9. There is subcutaneous soft tissue stranding overlying the coccyx. No acute osseous abnormality elsewhere. No focal soft tissue abnormality elsewhere. IMPRESSION: 1. Large right pleural effusion with near-complete collapse of the right upper and lower lobes 2. Cirrhotic-appearing liver with mild splenomegaly and mild-to-moderate ascites; ascites also present within bilateral inguinal hernias 3. Cholelithiasis without gallbladder distention 4. Bilateral nonobstructive nephrolithiasis and small distal ureteral calculi bilaterally without ureteral dilatation (no obstruction) Electronically signed by: Evalene Coho MD 01/06/2024 05:31 AM EST RP Workstation: [id number]     .Critical Care  Performed by: Beverley Leita LABOR, PA-C Authorized by: Beverley Leita LABOR,  PA-C   Critical care provider statement:    Critical care time (minutes):  30   Critical care was time spent personally by me on the following activities:  Development of treatment plan with patient or surrogate, discussions with consultants, evaluation of patient's response to treatment, examination of patient, ordering and review of laboratory studies, ordering and review of radiographic studies, ordering and performing treatments and interventions, pulse oximetry, re-evaluation of patient's condition and review of old charts    Medications Ordered in the ED  cefTRIAXone  (ROCEPHIN ) 1 g in sodium chloride  0.9 % 100 mL IVPB (has no administration in time range)  sodium chloride  0.9 % bolus 1,000 mL (0 mLs Intravenous Stopped 01/06/24 0619)  iohexol  (OMNIPAQUE ) 350 MG/ML injection 75 mL (75 mLs Intravenous Contrast Given 01/06/24 0439)                                    Medical Decision Making Amount and/or Complexity of Data Reviewed Labs: ordered. Radiology: ordered.  Risk Prescription drug management. Decision regarding hospitalization.   This patient presents to the ED for concern of abdominal fistula, distention, this involves an extensive number of treatment options, and is a complaint that carries with it a high risk of complications and morbidity.  The differential diagnosis includes but not limited to ascites, SBP, pleural effusion, PNX, PNA   Co morbidities / Chronic conditions that complicate the patient evaluation  As reviewed in HPI   Additional history obtained:  Additional history obtained from EMR External records from outside source obtained and reviewed including prior dc summary from [facility] for admission 10/28-11/5   Lab Tests:  I Ordered, and personally interpreted labs.  The pertinent results include:  lactic elevated at 3.0, repeat is improved to 1.9;  CBC with mild leukocytosis at 13. CMP with Cr 1.9, AST 53, bili 2.0. lipase WNL.    Imaging Studies  ordered:  I ordered imaging studies including CT chest, abdomen/pelvis  I independently visualized and interpreted imaging which showed ascites, large right pleural effusion  I agree with the radiologist interpretation   Problem List / ED Course / Critical interventions / Medication management  58 year old male presents with complaint of abdominal distention, frustrated he still has a fistula from his prior admission to [facility]. He presents without complaint otherwise. He is found to have mild generalized abdominal tenderness, diminished right side lung sounds. CT c/a/p reveals a large right pleural effusion as well as general ascites.  Patient is afebrile, he does have a mild leukocytosis.  His lactic acid is elevated at 3.0, improved to 1.9 on recheck.  He is provided  with IV fluids for his elevated lactic acid as well as Rocephin  for consideration of SBP given elevated WBC and lactic acid.  Plan is to admit to the hospitalist service for further management.  May benefit from IR evaluation.  Patient refuses transport back to [facility] for his admission.  He states that is why he came all the way here from [facility] for his evaluation today.   I ordered medication including IVF   Reevaluation of the patient after these medicines showed that the patient remained stable I have reviewed the patients home medicines and have made adjustments as needed   Consultations Obtained:  Care signed out at change of shift pending consult to teaching service.    Social Determinants of Health:  Specialty care team   Test / Admission - Considered:  admit      Final diagnoses:  Other ascites  Pleural effusion  Elevated lactic acid level    ED Discharge Orders     None          Beverley Leita LABOR, PA-C 01/06/24 0631    Beverley Leita LABOR, PA-C 01/06/24 0631    Haze Lonni PARAS, MD 01/07/24 [phone]

## 2024-01-06 NOTE — Hospital Course (Addendum)
 James Downs is a 58 y.o.male with a history of nonalcoholic cirrhosis, esophageal varices, GERD, anxiety/depression, tobacco use who was admitted to the [facility] Medicine Teaching Service at [facility] for decompensated cirrhosis. His hospital course is detailed below:  Decompensated cirrhosis  Pleural effusion Ascites  James Downs presented to the [facility] ED with abdominal pain.  Imaging was indicative of fluid overload including right pleural effusion and ascites.  The patient was afebrile with mild leukocytosis, initially elevated lactic acid, and abdominal pain concerning for possible SBP.  Empiric SBP treatment initiated. Thoracentesis was performed by IM with 1.5 L of milky fluid removed.  Pleural fluid showed ***. Initial diagnostic paracentesis with concern for SBP, so patient completed empiric SBP abx treatment ***. Paracentesis performed 11/14 with 1.2L fluid removal.   Palliative care was involved in patient care during admission. Patient elected for ongoing medical interventions with plan to resume outpatient palliative care.   Hepatorenal syndrome  History of hepatorenal syndrome with creatinine of 1.9 on admission.  This is consistent with patient baseline.  Other chronic conditions were medically managed with home medications and formulary alternatives as necessary (GERD, anxiety/depression, tobacco use)  PCP Follow-up Recommendations: Ongoing palliative recs; ensure outpatient follow-up

## 2024-01-07 ENCOUNTER — Inpatient Hospital Stay (HOSPITAL_COMMUNITY)

## 2024-01-07 DIAGNOSIS — Z7189 Other specified counseling: Secondary | ICD-10-CM | POA: Diagnosis not present

## 2024-01-07 DIAGNOSIS — F419 Anxiety disorder, unspecified: Secondary | ICD-10-CM | POA: Diagnosis not present

## 2024-01-07 DIAGNOSIS — I10 Essential (primary) hypertension: Secondary | ICD-10-CM

## 2024-01-07 DIAGNOSIS — K7290 Hepatic failure, unspecified without coma: Secondary | ICD-10-CM | POA: Diagnosis not present

## 2024-01-07 DIAGNOSIS — R188 Other ascites: Secondary | ICD-10-CM | POA: Diagnosis not present

## 2024-01-07 DIAGNOSIS — R54 Age-related physical debility: Secondary | ICD-10-CM | POA: Diagnosis not present

## 2024-01-07 DIAGNOSIS — F17210 Nicotine dependence, cigarettes, uncomplicated: Secondary | ICD-10-CM | POA: Diagnosis not present

## 2024-01-07 DIAGNOSIS — K7460 Unspecified cirrhosis of liver: Secondary | ICD-10-CM | POA: Diagnosis not present

## 2024-01-07 DIAGNOSIS — K632 Fistula of intestine: Secondary | ICD-10-CM | POA: Insufficient documentation

## 2024-01-07 DIAGNOSIS — Z66 Do not resuscitate: Secondary | ICD-10-CM | POA: Diagnosis not present

## 2024-01-07 DIAGNOSIS — J90 Pleural effusion, not elsewhere classified: Secondary | ICD-10-CM | POA: Diagnosis not present

## 2024-01-07 DIAGNOSIS — J948 Other specified pleural conditions: Secondary | ICD-10-CM | POA: Diagnosis not present

## 2024-01-07 DIAGNOSIS — Z515 Encounter for palliative care: Secondary | ICD-10-CM | POA: Diagnosis not present

## 2024-01-07 LAB — COMPREHENSIVE METABOLIC PANEL WITH GFR
ALT: 18 U/L (ref 0–44)
AST: 39 U/L (ref 15–41)
Albumin: 2.3 g/dL — ABNORMAL LOW (ref 3.5–5.0)
Alkaline Phosphatase: 40 U/L (ref 38–126)
Anion gap: 10 (ref 5–15)
BUN: 25 mg/dL — ABNORMAL HIGH (ref 6–20)
CO2: 24 mmol/L (ref 22–32)
Calcium: 8.5 mg/dL — ABNORMAL LOW (ref 8.9–10.3)
Chloride: 101 mmol/L (ref 98–111)
Creatinine, Ser: 1.79 mg/dL — ABNORMAL HIGH (ref 0.61–1.24)
GFR, Estimated: 43 mL/min — ABNORMAL LOW (ref >60)
Glucose, Bld: 95 mg/dL (ref 70–99)
Potassium: 3.9 mmol/L (ref 3.5–5.1)
Sodium: 135 mmol/L (ref 135–145)
Total Bilirubin: 1.1 mg/dL (ref 0.0–1.2)
Total Protein: 6.2 g/dL — ABNORMAL LOW (ref 6.5–8.1)

## 2024-01-07 LAB — CBC
HCT: 30.3 % — ABNORMAL LOW (ref 39.0–52.0)
Hemoglobin: 10.3 g/dL — ABNORMAL LOW (ref 13.0–17.0)
MCH: 32.2 pg (ref 26.0–34.0)
MCHC: 34 g/dL (ref 30.0–36.0)
MCV: 94.7 fL (ref 80.0–100.0)
Platelets: 190 K/uL (ref 150–400)
RBC: 3.2 MIL/uL — ABNORMAL LOW (ref 4.22–5.81)
RDW: 16.4 % — ABNORMAL HIGH (ref 11.5–15.5)
WBC: 10 K/uL (ref 4.0–10.5)
nRBC: 0 % (ref 0.0–0.2)

## 2024-01-07 LAB — PROTIME-INR
INR: 1.6 — ABNORMAL HIGH (ref 0.8–1.2)
Prothrombin Time: 19.9 s — ABNORMAL HIGH (ref 11.4–15.2)

## 2024-01-07 LAB — PATHOLOGIST SMEAR REVIEW

## 2024-01-07 MED ORDER — ALBUMIN HUMAN 25 % IV SOLN
75.0000 g | Freq: Once | INTRAVENOUS | Status: AC
  Administered 2024-01-09: 75 g via INTRAVENOUS
  Filled 2024-01-07: qty 300

## 2024-01-07 MED ORDER — ALBUMIN HUMAN 25 % IV SOLN
100.0000 g | Freq: Once | INTRAVENOUS | Status: AC
  Administered 2024-01-07: 100 g via INTRAVENOUS
  Filled 2024-01-07: qty 400

## 2024-01-07 MED ORDER — ACETAMINOPHEN 500 MG PO TABS: 500.0000 mg | ORAL_TABLET | Freq: Four times a day (QID) | ORAL | Status: DC | PRN

## 2024-01-07 MED ORDER — LACTULOSE 10 GM/15ML PO SOLN
30.0000 g | Freq: Three times a day (TID) | ORAL | Status: DC
  Administered 2024-01-07 – 2024-01-11 (×12): 30 g via ORAL
  Filled 2024-01-07: qty 45
  Filled 2024-01-07: qty 60
  Filled 2024-01-07 (×2): qty 45
  Filled 2024-01-07: qty 60
  Filled 2024-01-07: qty 45
  Filled 2024-01-07: qty 60
  Filled 2024-01-07: qty 45
  Filled 2024-01-07 (×5): qty 60

## 2024-01-07 NOTE — Progress Notes (Addendum)
 NAME:  James Downs, MRN:  [id number], DOB:  February 28, 1965, LOS: 1 ADMISSION DATE:  01/05/2024, CONSULTATION DATE: 11/13   REFERRING MD:  Lupie CHIEF COMPLAINT:  Recurrent Right Pleural Effusion   History of Present Illness:  Pt is a 58 yr old male with significant hx of decompensated liver cirrhosis, hepatorenal syndrome, HTN, HLD, recent admission at [facility] (from 10/28-11/5 AMS and decompensated liver cirrhosis), generalized anxiety, and tobacco use who presented with complaints of abdominal pain. Per recorded, patient reported that onset of pain begin around a week ago from last paracentesis received at [facility]. Patient admitted under [facility] medicine service due to ongoing decompensated liver cirrhosis and ascites. Patient underwent a diagnostic paracentesis on 11/12. Patient also had a right sided thoracentesis completed, removing approximately 1.5 Liters of fluid, appearing transudative based on Lights Criteria.   Patient had repeat imaging showing re-accumulation/expansion of effusion. PCCM consulted for possible chest tube placement in setting of recurrent pleural effusion.  Patient denies any improvement in his breathing post-thoracentesis.   Pertinent  Medical History   Past Medical History:  Diagnosis Date   Arthritis    Depression    Gout    Hypercholesterolemia    Hypertension    on meds   Kidney damage    Kidney stone    Obesity    Sleep apnea    uses CPAP     Significant Hospital Events: Including procedures, antibiotic start and stop dates in addition to other pertinent events   11/13 PCCM consult for recurrent pleural effusion in setting of decompensated cirrhosis w/ascites   Interim History / Subjective:  As above  Objective    Blood pressure 125/61, pulse 63, temperature (!) 97.5 F (36.4 C), resp. rate 18, SpO2 94%.        Intake/Output Summary (Last 24 hours) at 01/07/2024 1357 Last data filed at 01/07/2024 0919 Gross per 24  hour  Intake 240 ml  Output 150 ml  Net 90 ml   There were no vitals filed for this visit.  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 is related to decompensated cirrhosis w/ascites  P:  Chest tube placement contraindicated due to risk of infection, protein malnutrition, worsening renal failure Recommend consulting IR to determine if patient is candidate for TIPs procedure Optimize diuretics in the mean time if BP allows  Continue 2 gram sodium diet as ordered  Continue Palliative Care GOC discussions, appreciate palliative cares assistance  Continue rocephin , rifaximin Continue midodrine   Other Problem List Managed by Primary:  HTN,  HLD, Tobacco use  Generalized Anxiety   PCCM will sign off, please let us  know if there are any further questions.  Labs   CBC: Recent Labs  Lab 01/05/24 2245 01/07/24 0328  WBC 13.0* 10.0  HGB 11.9* 10.3*  HCT 35.1* 30.3*  MCV 95.6 94.7  PLT 245 190    Basic Metabolic Panel: Recent Labs  Lab 01/05/24 2245 01/07/24 0328  NA 134* 135  K 4.2 3.9  CL 97* 101  CO2 24 24  GLUCOSE 107* 95  BUN 26* 25*  CREATININE 1.90* 1.79*  CALCIUM 8.7* 8.5*   GFR: CrCl cannot be calculated (Unknown  ideal weight.). Recent Labs  Lab 01/05/24 2245 01/06/24 0412 01/06/24 0623 01/07/24 0328  WBC 13.0*  --   --  10.0  LATICACIDVEN  --  3.0* 1.9  --     Liver Function Tests: Recent Labs  Lab 01/05/24 2245 01/06/24 1813 01/07/24 0328  AST 53*  --  39  ALT 22  --  18  ALKPHOS 50  --  40  BILITOT 2.0*  --  1.1  PROT 7.3 6.8 6.2*  ALBUMIN  2.7*  --  2.3*    Recent Labs  Lab 01/05/24 2245  LIPASE 50   No results for input(s): AMMONIA in the last 168 hours.  ABG    Component Value Date/Time   PHART 7.367 11/29/2013 1816   PCO2ART 44.0 11/29/2013 1816   PO2ART 457.0 (H) 11/29/2013 1816   HCO3 25.2 (H) 11/29/2013 1816   TCO2 20 (L) 11/23/2023 0922   O2SAT 100.0 11/29/2013 1816     Coagulation Profile: Recent Labs  Lab 01/07/24 1247  INR 1.6*    Cardiac Enzymes: No results for input(s): CKTOTAL, CKMB, CKMBINDEX, TROPONINI in the last 168 hours.  HbA1C: Hgb A1c MFr Bld  Date/Time Value Ref Range Status  11/23/2023 03:26 PM 3.7 (L) 4.8 - 5.6 % Final    Comment:    (NOTE) Diagnosis of Diabetes The following HbA1c ranges recommended by the American Diabetes Association (ADA) may be used as an aid in the diagnosis of diabetes mellitus.  Hemoglobin             Suggested A1C NGSP%              Diagnosis  <5.7                   Non Diabetic  5.7-6.4                Pre-Diabetic  >6.4                   Diabetic  <7.0                   Glycemic control for                       adults with diabetes.    11/04/2013 01:05 PM 6.5 (H) 4.8 - 5.6 % Final    Comment:             Increased risk for diabetes: 5.7 - 6.4          Diabetes: >6.4          Glycemic control for adults with diabetes: <7.0    CBG: No results for input(s): GLUCAP in the last 168 hours.  Review of Systems:   Review of Systems  Constitutional:  Negative for chills, fever, malaise/fatigue and weight loss.  HENT:  Negative for congestion, sinus pain and sore throat.   Eyes: Negative.   Respiratory:  Negative for cough, hemoptysis, sputum production, shortness of breath and wheezing.   Cardiovascular:  Negative for chest pain, palpitations, orthopnea, claudication and leg swelling.  Gastrointestinal:  Negative for abdominal pain, heartburn, nausea and vomiting.  Genitourinary: Negative.   Musculoskeletal:  Negative for joint pain and  myalgias.  Skin:  Negative for rash.  Neurological:  Negative for weakness.  Endo/Heme/Allergies: Negative.   Psychiatric/Behavioral: Negative.       Past Medical History:  He,  has a past medical history of Arthritis, Depression, Gout, Hypercholesterolemia, Hypertension, Kidney  damage, Kidney stone, Obesity, and Sleep apnea.   Surgical History:   Past Surgical History:  Procedure Laterality Date   FOOT SURGERY Right 1991   HAND SURGERY Right 1985   IR PARACENTESIS  11/23/2023   IR PARACENTESIS  11/25/2023   IR PARACENTESIS  12/04/2023   IR PARACENTESIS  01/06/2024   IR THORACENTESIS ASP PLEURAL SPACE W/IMG GUIDE  01/06/2024   POSTERIOR LUMBAR FUSION 4 LEVEL N/A 11/29/2013   Procedure: Thoracic Eight, Thoracic Nine, Costotransversectomy   Thoracic Seven-Eight Thoracic Eight-Nine diskectomy;  Surgeon: Gerldine Maizes, MD;  Location: [facility];  Service: Neurosurgery;  Laterality: N/A;  Thoracic Eight, Thoracic Nine, Costotransversectomy   Thoracic Seven-Eight Thoracic Eight-Nine diskectomy     Social History:   reports that he has been smoking cigarettes. He started smoking about 43 years ago. He has a 43.9 pack-year smoking history. He has quit using smokeless tobacco.  His smokeless tobacco use included chew and snuff. He reports that he does not currently use alcohol. He reports that he does not use drugs.   Family History:  His family history includes Cancer in his father and paternal grandfather; Diabetes in his maternal grandmother; Heart failure in his maternal grandmother; Hypertension in his father, maternal grandfather, and maternal grandmother.   Allergies No Known Allergies   Home Medications  Prior to Admission medications   Medication Sig Start Date End Date Taking? Authorizing Provider  aspirin EC 81 MG tablet Take 81 mg by mouth daily. Swallow whole.   Yes [provider]  barrier cream (NON-SPECIFIED) CREA Apply 1 Application topically in the morning,  at noon, and at bedtime. Apply to sacrum for sacral redness   Yes [provider]  ciprofloxacin (CIPRO) 500 MG tablet Take 500 mg by mouth every morning. 12/30/23 01/29/24 Yes [provider]  escitalopram (LEXAPRO) 5 MG tablet Take 1 tablet (5 mg total) by mouth daily. 12/11/23  Yes Cleotilde Lukes, DO  furosemide  (LASIX ) 40 MG tablet Take 1 tablet (40 mg total) by mouth daily. 05/26/23 05/25/24 Yes Amin, Ankit C, MD  hydrocortisone  cream 1 % Apply 1 Application topically 3 (three) times daily.   Yes [provider]  lactulose  (CHRONULAC ) 10 GM/15ML solution Take 15 mLs (10 g total) by mouth 2 (two) times daily. Patient taking differently: Take 20 g by mouth 3 (three) times daily. 12/11/23  Yes Cleotilde Lukes, DO  midodrine (PROAMATINE) 10 MG tablet Take 3 tablets (30 mg total) by mouth 3 (three) times daily with meals. 12/11/23  Yes Cleotilde Lukes, DO  ondansetron  (ZOFRAN -ODT) 4 MG disintegrating tablet Take 4 mg by mouth 2 (two) times daily as needed for nausea or vomiting. For 14 days 12/14/23  Yes [provider]  pantoprazole (PROTONIX) 40 MG tablet Take 1 tablet (40 mg total) by mouth 2 (two) times daily before a meal. 12/11/23  Yes Cleotilde Lukes, DO  rifaximin (XIFAXAN) 550 MG TABS tablet Take 1 tablet (550 mg total) by mouth 2 (two) times daily. 12/11/23  Yes Cleotilde Lukes, DO  spironolactone  (ALDACTONE ) 25 MG tablet Take 25 mg by mouth daily. 12/30/23  Yes [provider]  traMADol (ULTRAM) 50 MG tablet Take 50 mg by mouth every 6 (six) hours as needed (for pain). 12/30/23  Yes [provider]  traZODone  (DESYREL ) 50 MG tablet Take 25 mg by mouth at bedtime.   Yes [provider]  acetaminophen  (TYLENOL ) 325 MG tablet Take 2 tablets (650 mg total) by mouth every 8 (eight) hours as  needed for mild pain (pain score 1-3) or fever (or Fever >/= 101). Patient not taking: Reported on 01/06/2024 12/11/23   Cleotilde Lukes, DO   carvedilol  (COREG ) 6.25 MG tablet Take 6.25 mg by mouth 2 (two) times daily. Patient not taking: Reported on 11/23/2023    [provider]  feeding supplement (ENSURE PLUS HIGH PROTEIN) LIQD Take 237 mLs by mouth 3 (three) times daily between meals. Patient not taking: Reported on 01/06/2024 12/11/23   Cleotilde Lukes, DO  Multiple Vitamin (MULTIVITAMIN WITH MINERALS) TABS tablet Take 1 tablet by mouth daily. Patient not taking: Reported on 01/06/2024 12/11/23   Cleotilde Lukes, DO  spironolactone  (ALDACTONE ) 100 MG tablet Take 1 tablet (100 mg total) by mouth daily. Patient not taking: Reported on 11/23/2023 05/22/23   Caleen Burgess BROCKS, MD     Total time: 40 mins     Dorn Chill, MD [facility] Office: [phone]   See Amion for personal pager PCCM on call pager [phone] until 7pm. Please call Elink 7p-7a. [phone]

## 2024-01-07 NOTE — Assessment & Plan Note (Signed)
 Anxiety/depression: Continue home Lexapro 5mg  GERD: Continue home Protonix 40mg 

## 2024-01-07 NOTE — Progress Notes (Addendum)
   01/07/24 0929  Mobility  Activity Dangled on edge of bed  Level of Assistance Minimal assist, patient does 75% or more  Assistive Device Other (Comment) (Bedrail.HHA)  Range of Motion/Exercises Active;All extremities  Activity Response Tolerated fair  Mobility Referral Yes  Mobility visit 1 Mobility  Mobility Specialist Start Time (ACUTE ONLY) [phone]  Mobility Specialist Stop Time (ACUTE ONLY) 0943  Mobility Specialist Time Calculation (min) (ACUTE ONLY) 14 min   Mobility Specialist: Progress Note  Pre-Mobility:      HR 78, SPO2 94% 1.5L During Mobility: HR 79, SPO2 95% 1.5L, BP 115/77 (88) Post-Mobility:    HR 75, SPO2 95% 1.5L  Pt agreeable to mobility session - received in bed. C/o slight dizziness and L knee tightening.  Returned to EOB eating breakfast with all needs met - call bell within reach. Bed alarm on.   Additional comments: Opted for EOB d/t possibility of diarrhea.   Exercises: Seated Marches, leg extensions (x20), toe taps (x20), heel taps (x20), arm extensions (x20), chair pushups (x10)  Virgle Boards, BS Mobility Specialist Please contact via SecureChat or  Rehab office at [phone].

## 2024-01-07 NOTE — Assessment & Plan Note (Addendum)
 Patient with chronic non-alcoholic cirrhosis, presenting with decompensation at this time. MELD score 23. Initial mild-to-moderate ascites and large R pleural effusion seen on imaging, now s/p thoracentesis with IR.  Worsening chest x-ray today with complete whiteout of right lung. -Appreciate IR consult: -S/p thoracentesis with 1.5 L yellow cloudy fluid removed in ED -Plan for therapeutic paracentesis today - Continue CTX 2mg /day for SBP for 7 days as meets criteria for primary prophylaxis SBP even with blood cultures NGTD -Palliative consult, appreciate recs -PCCM consult, appreciate recs  -Patient is not eligible for chest tube with cirrhosis related pleural effusion at this time, recommend TIPS procedure however patient has previously been denied due to elevated MELD score  - Patient in eligible for diuresis with soft blood pressures and need for midodrine 3 times daily -Continue home midodrine 30 TID - Increase home lactulose  30 mg TID, continue home rifaximin 550mg  BID -Continuous cardiac monitoring  -AM CMP -Family discussion on medical management and GOC plan for tomorrow -Salt restriction max of 2 g daily -Albumin  100 on day one and 75 on day 3 -Possible source of infection? Lesion from prior paracentesis to left abdomen with ostomy bag intact, clean and dry, no fistula appreciated on CT scan, no leakage, continue to monitor, consider GI consult if begins leaking

## 2024-01-07 NOTE — Assessment & Plan Note (Addendum)
 Hx of hepatorenal syndrome. Cr at baseline.  - Continue to monitor kidney function  - Avoid nephrotoxic agents

## 2024-01-07 NOTE — NC FL2 (Signed)
 [place]  MEDICAID FL2 LEVEL OF CARE FORM     IDENTIFICATION  Patient Name: James Downs Birthdate: 1965/05/24 Sex: male Admission Date (Current Location): 01/05/2024  [facility] and [place] Number:  Producer, Television/film/video and Address:  [facility] [place], [address], [place], [place] [address]      Provider Number: [id number]  Attending Physician Name and Address:  McDiarmid, Krystal BIRCH, MD  Relative Name and Phone Number:       Current Level of Care: Hospital Recommended Level of Care: Skilled Nursing Facility Prior Approval Number:    Date Approved/Denied:   PASRR Number: [id number]  Discharge Plan: SNF    Current Diagnoses: Patient Active Problem List   Diagnosis Date Noted   Decompensated cirrhosis (HCC) 01/06/2024   Pleural effusion 01/06/2024   Ascites 01/06/2024   Elevated lactic acid level 01/06/2024   Difficulty sleeping 12/10/2023   Chronic health problem 12/08/2023   Malnutrition of moderate degree 12/08/2023   Hepatic encephalopathy (HCC) 12/05/2023   Goals of care, counseling/discussion 12/05/2023   Palliative care by specialist 12/05/2023   AKI (acute kidney injury) 11/28/2023   Depression 11/25/2023   SBP (spontaneous bacterial peritonitis) (HCC) 11/24/2023   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   T2DM (type 2 diabetes mellitus) (HCC) 11/23/2023   Cirrhosis of liver with ascites (HCC) 11/23/2023   Hepatorenal syndrome (HCC) 11/23/2023   Black stool 11/23/2023   Esophageal varices in cirrhosis (HCC) 11/23/2023   Anasarca 05/19/2023   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   Weakness of left leg 11/04/2013   Paresthesias 11/04/2013   Lumbar radiculopathy 11/04/2013   Degenerative disc disease, lumbar 11/04/2013   ONYCHOMYCOSIS 12/23/2006   HYPERLIPIDEMIA 12/23/2006   ANXIETY DISORDER, GENERALIZED 12/23/2006   TOBACCO ABUSE 12/23/2006   SYNDROME, CARPAL TUNNEL 12/23/2006    HYPERTENSION 12/23/2006   NEPHROLITHIASIS, HX OF 12/23/2006    Orientation RESPIRATION BLADDER Height & Weight     Self, Time, Situation, Place  O2 (Nasal cannula 2L) Continent Weight:   Height:     BEHAVIORAL SYMPTOMS/MOOD NEUROLOGICAL BOWEL NUTRITION STATUS      Continent Diet (See dc summary)  AMBULATORY STATUS COMMUNICATION OF NEEDS Skin   Limited Assist Verbally Normal                       Personal Care Assistance Level of Assistance  Bathing, Feeding, Dressing Bathing Assistance: Limited assistance Feeding assistance: Limited assistance Dressing Assistance: Limited assistance     Functional Limitations Info  Sight Sight Info: Impaired        SPECIAL CARE FACTORS FREQUENCY  PT (By licensed PT), OT (By licensed OT)     PT Frequency: eval and treat OT Frequency: eval and treat            Contractures Contractures Info: Not present    Additional Factors Info  Code Status, Allergies Code Status Info: DNR Allergies Info: NKA           Current Medications (01/07/2024):  This is the current hospital active medication list Current Facility-Administered Medications  Medication Dose Route Frequency Provider Last Rate Last Admin   cefTRIAXone  (ROCEPHIN ) 2 g in sodium chloride  0.9 % 100 mL IVPB  2 g Intravenous Q24H Diona Perkins, MD   Stopped at 01/06/24 1535   escitalopram (LEXAPRO) tablet 5 mg  5 mg Oral Daily Cleotilde Perkins, DO   5 mg at 01/07/24 0919   lactulose  (CHRONULAC ) 10 GM/15ML solution 20 g  20 g Oral TID Cleotilde Perkins, DO   20 g at 01/07/24 0919   midodrine (PROAMATINE) tablet 30 mg  30 mg Oral TID WC Cleotilde Perkins, DO   30 mg at 01/07/24 9080   nicotine (NICODERM CQ - dosed in mg/24 hours) patch 14 mg  14 mg Transdermal Daily Diona Perkins, MD       oxyCODONE  (Oxy IR/ROXICODONE ) immediate release tablet 5 mg  5 mg Oral Q4H PRN Bronson, Martin, DO   5 mg at 01/06/24 2111   pantoprazole (PROTONIX) EC tablet 40 mg  40 mg Oral BID AC Cleotilde Perkins, DO    40 mg at 01/07/24 0919   rifaximin (XIFAXAN) tablet 550 mg  550 mg Oral BID Cleotilde Perkins, DO   550 mg at 01/07/24 9080     Discharge Medications: Please see discharge summary for a list of discharge medications.  Relevant Imaging Results:  Relevant Lab Results:   Additional Information SS#: [id number]. Add OP Palliative Care  Jamall Strohmeier S Linken Mcglothen, LCSW

## 2024-01-07 NOTE — Progress Notes (Signed)
 Daily Progress Note Intern Pager: [phone]  Patient name: James Downs Medical record number: [id number] Date of birth: 1965/05/18 Age: 58 y.o. Gender: male  Primary Care Provider: Patient, No Pcp Per Consultants: IR Code Status: DNR Limited  Pt Overview and Major Events to Date:  11/12: Admitted for decompensated cirrhosis with pleural effusion and ascites 11/12: Diagnostic thoracentesis yielding 1.5L of cloudy, yellow fluid; diagnostic RLQ paracentesis yielding of cloudy, yellow fluid  11/13: Therapeutic paracentesis yielding   Medical Decision Making:  James Downs is a 58 year old male admitted for decompensated cirrhosis with pleural effusion and ascites.  He presented with worsening abdominal pain over the last week.  In the ED he was found with moderate ascites and a large right pleural effusion.  IR did a diagnostic thoracentesis pulling 1.5 L of milky white fluid.  Patient had a mild leukocytosis mildly elevated LFTs and an elevated lactic acid of 3.0. Per chart review, patient most recently admitted to [facility] (10/28-11/5/25) for similar presentation, received empiric treatment for SBP (peritoneal culture was negative though abx were given prior to collection), underwent multiple paracenteses, and was started on oral SBP prophylaxis (ciprofloxacin) upon discharge.  Pertinent PMH/PSH includes nonalcoholic cirrhosis, CKD.  Assessment & Plan Decompensated cirrhosis (HCC) Pleural effusion Ascites Patient with chronic non-alcoholic cirrhosis, presenting with decompensation at this time. MELD score 23. Initial mild-to-moderate ascites and large R pleural effusion seen on imaging, now s/p thoracentesis with IR.  Worsening chest x-ray today with complete whiteout of right lung. -Appreciate IR consult: -S/p thoracentesis with 1.5 L yellow cloudy fluid removed in ED -Plan for therapeutic paracentesis today - Continue CTX 2mg /day for SBP for 7 days as meets criteria  for primary prophylaxis SBP even with blood cultures NGTD -Palliative consult, appreciate recs -PCCM consult, appreciate recs  -Patient is not eligible for chest tube with cirrhosis related pleural effusion at this time, recommend TIPS procedure however patient has previously been denied due to elevated MELD score  - Patient in eligible for diuresis with soft blood pressures and need for midodrine 3 times daily -Continue home midodrine 30 TID - Increase home lactulose  30 mg TID, continue home rifaximin 550mg  BID -Continuous cardiac monitoring  -AM CMP -Family discussion on medical management and GOC plan for tomorrow -Salt restriction max of 2 g daily -Albumin  100 on day one and 75 on day 3 -Possible source of infection? Lesion from prior paracentesis to left abdomen with ostomy bag intact, clean and dry, no fistula appreciated on CT scan, no leakage, continue to monitor, consider GI consult if begins leaking Hepatorenal syndrome (HCC) Hx of hepatorenal syndrome. Cr at baseline.  -Continue to monitor kidney function  -Avoid nephrotoxic agents  Chronic health problem Anxiety/depression: Continue home Lexapro 5mg  GERD: Continue home Protonix 40mg   FEN/GI: Regular diet  PPx: SCDs Dispo:Home pending clinical improvement .   Subjective:  Patient was seen and examined at bedside.  He was given 5 mg oxycodone  overnight for abdominal pain with relief.  No complaints at this time.  He is currently on 2 L nasal cannula which he says he woke up with on his face.  Objective: Temp:  [97.7 F (36.5 C)-98 F (36.7 C)] 97.7 F (36.5 C) (11/13 0400) Pulse Rate:  [63-85] 65 (11/13 0400) Resp:  [14-21] 14 (11/13 0400) BP: (97-119)/(60-80) 100/66 (11/13 0400) SpO2:  [91 %-96 %] 93 % (11/13 0400) Physical Exam: General: Chronically ill-appearing male lying supine on hospital bed in no acute distress Cardiovascular: RRR,  no murmurs, 2+ radial pulses Respiratory: Faint crackles to right lower lobe,  normal work of breathing on 2 L nasal cannula oxygen, no wheezes Abdomen: Soft, moderate tenderness diffusely, fluid positive fluid wave, lesion from prior paracentesis to left abdomen with clean and dry ostomy bag intact, wound is also clean and dry without signs of infection, leakage or drainage, bowel sounds present, mild distention Extremities: No peripheral edema, moves all extremities equally  Laboratory: Most recent CBC Lab Results  Component Value Date   WBC 10.0 01/07/2024   HGB 10.3 (L) 01/07/2024   HCT 30.3 (L) 01/07/2024   MCV 94.7 01/07/2024   PLT 190 01/07/2024   Most recent BMP    Latest Ref Rng & Units 01/07/2024    3:28 AM  BMP  Glucose 70 - 99 mg/dL 95   BUN 6 - 20 mg/dL 25   Creatinine 9.38 - 1.24 mg/dL 8.20   Sodium 864 - 854 mmol/L 135   Potassium 3.5 - 5.1 mmol/L 3.9   Chloride 98 - 111 mmol/L 101   CO2 22 - 32 mmol/L 24   Calcium 8.9 - 10.3 mg/dL 8.5     Lupie Credit, DO 01/07/2024, 8:02 AM  PGY-1, [facility] FPTS Intern pager: [phone], text pages welcome Secure chat group [facility] Teaching Service

## 2024-01-07 NOTE — TOC Initial Note (Signed)
 Transition of Care [facility]) - Initial/Assessment Note    Patient Details  Name: James Downs MRN: [id number] Date of Birth: 13-Jun-1965  Transition of Care [facility]) CM/SW Contact:    Inocente GORMAN Kindle, LCSW Phone Number: 01/07/2024, 11:13 AM  Clinical Narrative:                 Patient admitted from [facility] and requests to return at discharge. CSW updated facility and requested they add Palliative care at discharge as they use their own palliative care in house.    Expected Discharge Plan: Skilled Nursing Facility Barriers to Discharge: Continued Medical Work up   Patient Goals and CMS Choice Patient states their goals for this hospitalization and ongoing recovery are:: Return to SNF          Expected Discharge Plan and Services In-house Referral: Clinical Social Work   Post Acute Care Choice: Skilled Nursing Facility Living arrangements for the past 2 months: Skilled Nursing Facility                                      Prior Living Arrangements/Services Living arrangements for the past 2 months: Skilled Nursing Facility Lives with:: Facility Resident Patient language and need for interpreter reviewed:: Yes Do you feel safe going back to the place where you live?: Yes      Need for Family Participation in Patient Care: Yes (Comment) Care giver support system in place?: Yes (comment)   Criminal Activity/Legal Involvement Pertinent to Current Situation/Hospitalization: No - Comment as needed  Activities of Daily Living   ADL Screening (condition at time of admission) Independently performs ADLs?: No Does the patient have a NEW difficulty with bathing/dressing/toileting/self-feeding that is expected to last >3 days?: No Does the patient have a NEW difficulty with getting in/out of bed, walking, or climbing stairs that is expected to last >3 days?: No Does the patient have a NEW difficulty with communication that is expected to last >3 days?: No Is the  patient deaf or have difficulty hearing?: No Does the patient have difficulty seeing, even when wearing glasses/contacts?: Yes Does the patient have difficulty concentrating, remembering, or making decisions?: No  Permission Sought/Granted Permission sought to share information with : Facility Medical Sales Representative, Family Supports Permission granted to share information with : Yes, Verbal Permission Granted  Share Information with NAME: Nehemias, [place] [phone]  Permission granted to share info w AGENCY: [facility]        Emotional Assessment Appearance:: Appears stated age Attitude/Demeanor/Rapport: Engaged Affect (typically observed): Accepting Orientation: : Oriented to Self, Oriented to Place, Oriented to  Time, Oriented to Situation Alcohol / Substance Use: Not Applicable Psych Involvement: No (comment)  Admission diagnosis:  Pleural effusion [J90] Other ascites [R18.8] Elevated lactic acid level [R79.89] Decompensated cirrhosis (HCC) [K72.90, K74.60] Patient Active Problem List   Diagnosis Date Noted   Decompensated cirrhosis (HCC) 01/06/2024   Pleural effusion 01/06/2024   Ascites 01/06/2024   Elevated lactic acid level 01/06/2024   Difficulty sleeping 12/10/2023   Chronic health problem 12/08/2023   Malnutrition of moderate degree 12/08/2023   Hepatic encephalopathy (HCC) 12/05/2023   Goals of care, counseling/discussion 12/05/2023   Palliative care by specialist 12/05/2023   AKI (acute kidney injury) 11/28/2023   Depression 11/25/2023   SBP (spontaneous bacterial peritonitis) (HCC) 11/24/2023   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   T2DM (type 2 diabetes mellitus) (HCC) 11/23/2023  Cirrhosis of liver with ascites (HCC) 11/23/2023   Hepatorenal syndrome (HCC) 11/23/2023   Black stool 11/23/2023   Esophageal varices in cirrhosis (HCC) 11/23/2023   Anasarca 05/19/2023   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 [place] [address] Phone: [phone] Fax: [phone]     Social Drivers of Health (SDOH) Social History: SDOH Screenings   Food Insecurity: No Food Insecurity (01/06/2024)  Recent Concern: Food Insecurity - Food Insecurity Present (01/06/2024)  Housing: Unknown (01/06/2024)  Recent Concern: Housing - High Risk (01/06/2024)  Transportation Needs: No Transportation Needs (01/06/2024)  Utilities: Not At Risk (01/06/2024)  Social Connections: Moderately Integrated (01/06/2024)  Tobacco Use: High Risk (01/06/2024)   SDOH Interventions:     Readmission Risk Interventions     No data to display

## 2024-01-07 NOTE — Plan of Care (Signed)

## 2024-01-07 NOTE — Assessment & Plan Note (Deleted)
 Possible source of infection  Seems to be closed  Didn't show up on CT/wasn't commented on CT Talk more about it starts leaking

## 2024-01-08 ENCOUNTER — Inpatient Hospital Stay (HOSPITAL_COMMUNITY)

## 2024-01-08 DIAGNOSIS — Z7189 Other specified counseling: Secondary | ICD-10-CM

## 2024-01-08 DIAGNOSIS — K7460 Unspecified cirrhosis of liver: Secondary | ICD-10-CM | POA: Diagnosis not present

## 2024-01-08 DIAGNOSIS — R54 Age-related physical debility: Secondary | ICD-10-CM

## 2024-01-08 DIAGNOSIS — J948 Other specified pleural conditions: Secondary | ICD-10-CM | POA: Diagnosis not present

## 2024-01-08 DIAGNOSIS — J90 Pleural effusion, not elsewhere classified: Secondary | ICD-10-CM | POA: Diagnosis not present

## 2024-01-08 DIAGNOSIS — K7290 Hepatic failure, unspecified without coma: Secondary | ICD-10-CM | POA: Diagnosis not present

## 2024-01-08 DIAGNOSIS — R188 Other ascites: Secondary | ICD-10-CM | POA: Diagnosis not present

## 2024-01-08 LAB — COMPREHENSIVE METABOLIC PANEL WITH GFR
ALT: 15 U/L (ref 0–44)
AST: 37 U/L (ref 15–41)
Albumin: 3.2 g/dL — ABNORMAL LOW (ref 3.5–5.0)
Alkaline Phosphatase: 36 U/L — ABNORMAL LOW (ref 38–126)
Anion gap: 12 (ref 5–15)
BUN: 22 mg/dL — ABNORMAL HIGH (ref 6–20)
CO2: 26 mmol/L (ref 22–32)
Calcium: 8.9 mg/dL (ref 8.9–10.3)
Chloride: 102 mmol/L (ref 98–111)
Creatinine, Ser: 1.65 mg/dL — ABNORMAL HIGH (ref 0.61–1.24)
GFR, Estimated: 48 mL/min — ABNORMAL LOW (ref >60)
Glucose, Bld: 95 mg/dL (ref 70–99)
Potassium: 3.7 mmol/L (ref 3.5–5.1)
Sodium: 140 mmol/L (ref 135–145)
Total Bilirubin: 1.4 mg/dL — ABNORMAL HIGH (ref 0.0–1.2)
Total Protein: 6.8 g/dL (ref 6.5–8.1)

## 2024-01-08 MED ORDER — LIDOCAINE HCL 1 % IJ SOLN
20.0000 mL | Freq: Once | INTRAMUSCULAR | Status: AC
  Administered 2024-01-08: 10 mL

## 2024-01-08 NOTE — Progress Notes (Signed)
   01/08/24 0948  Mobility  Activity Stood at bedside (X3)  Level of Assistance Contact guard assist, steadying assist  Assistive Device Front wheel walker  Range of Motion/Exercises Active;All extremities  Activity Response Tolerated fair  Mobility Referral Yes  Mobility visit 1 Mobility  Mobility Specialist Start Time (ACUTE ONLY) [phone]  Mobility Specialist Stop Time (ACUTE ONLY) 1000  Mobility Specialist Time Calculation (min) (ACUTE ONLY) 12 min   Mobility Specialist: Progress Note   During Mobility: HR 80, SPO2 80% 2L Post-Mobility:    HR 74, SPO2 92% 2L   Pt agreeable to mobility session - received in bed. C/o slight dizziness and L knee tightening.  Returned to EOB eating breakfast with all needs met - call bell within reach. Bed alarm on.    Additional comments: Pt required rolling for pericare as he had uncontrollable diarrhea.    Exercises: Seated Marches (x20), leg extensions (x20), toe taps (x20), heel taps (x20), arm extensions (x20), chair pushups (x20), STS (x3)   Virgle Boards, BS Mobility Specialist Please contact via SecureChat or  Rehab office at [phone].

## 2024-01-08 NOTE — Plan of Care (Signed)

## 2024-01-08 NOTE — Progress Notes (Signed)
 Daily Progress Note Intern Pager: [phone]  Patient name: James Downs Medical record number: [id number] Date of birth: Jun 08, 1965 Age: 58 y.o. Gender: male  Primary Care Provider: Patient, No Pcp Per Consultants: IR, PCCM Code Status: DNR-limited  Pt Overview and Major Events to Date:  11/12: Admitted for decompensated cirrhosis with pleural effusion and ascites 11/12: Diagnostic thoracentesis yielding 1.5L of cloudy, yellow fluid; diagnostic RLQ paracentesis yielding of cloudy, yellow fluid  11/14: Therapeutic paracentesis planned    Medical Decision Making:   James CHRISTELLA. Downs is a 58 year old male admitted for decompensated cirrhosis with pleural effusion and ascites.  He presented with worsening abdominal pain over the last week.  In the ED he was found with moderate ascites and a large right pleural effusion.  IR did a diagnostic thoracentesis pulling 1.5 L of milky white fluid.  Patient had a mild leukocytosis mildly elevated LFTs and an elevated lactic acid of 3.0. Per chart review, patient most recently admitted to [facility] (10/28-11/5/25) for similar presentation, received empiric treatment for SBP (peritoneal culture was negative though abx were given prior to collection), underwent multiple paracenteses, and was started on oral SBP prophylaxis (ciprofloxacin) upon discharge.  Pertinent PMH/PSH includes nonalcoholic cirrhosis, CKD.  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 - IR consulted, appreciate recs: - S/p thoracentesis with 1.5 L yellow cloudy fluid removed in ED - Plan for therapeutic paracentesis today 11/14 - Continue CTX 2mg /day for SBP for 7 days (11/12 - 11/18) as meets criteria for primary prophylaxis SBP even with blood cultures NGTD - Palliative consult, appreciate recs - PCCM  consult, appreciate recs  -Patient ineligible for chest tube with cirrhosis related pleural effusion at this time, recommend TIPS procedure however patient has previously been denied due to elevated MELD score > 17 (23 this admission)  - Patient ineligible for diuresis with soft blood pressures and need for midodrine 3 times daily - Continue home midodrine 30 TID - Increase home lactulose  to 30 mg TID - Continue home rifaximin 550mg  BID - Continuous cardiac monitoring  - AM CMP, CBC - Family discussion on medical management and GOC plan 11/14  - Family would like some time to consider comfort care   - Palliative to discuss symptomatic management; will set up weekly paracentesis if comfort care - Salt restriction max of 2 g daily - Albumin  100g on day one 11/13 and 75g on day 3 11/15 to increase oncotic pressure - Possible source of infection? Lesion from prior paracentesis to left abdomen with ostomy bag intact, clean and dry, no fistula appreciated on CT scan, no leakage, continue to monitor, consider GI consult if begins leaking Hepatorenal syndrome (HCC) Hx of hepatorenal syndrome. Cr at baseline.  - Continue to monitor kidney function  - Avoid nephrotoxic agents  Chronic health problem Anxiety/depression: Continue home Lexapro 5mg  GERD: Continue home Protonix 40mg   FEN/GI: Regular diet PPx: SCDs Dispo: Home pending clinical improvement .   Subjective:  Patient was seen and evaluated at bedside. There were no overnight events.  VSS overnight on nasal cannula weaned down to 1L.   Objective: Temp:  [97.5 F (36.4 C)-98 F (36.7 C)] 97.7 F (36.5 C) (11/14 0400) Pulse Rate:  [63-72] 70 (11/14 0400) Resp:  [16-20] 20 (11/13 1600) BP: (103-125)/(61-77) 120/77 (11/14 0022) SpO2:  [91 %-94 %] 91 % (11/13 1600) Physical Exam: General: chronically  ill appearing male lying comfortably supine in bed in no acute distress Cardiovascular: RRR, no murmurs, 2+ radial pulses  Respiratory:  CTAB, normal WOB on 1L [place] O2 Abdomen: soft, diffusely tender, BS present Extremities: no peripheral edema, moves all extremities equally  Laboratory: Most recent CBC Lab Results  Component Value Date   WBC 10.0 01/07/2024   HGB 10.3 (L) 01/07/2024   HCT 30.3 (L) 01/07/2024   MCV 94.7 01/07/2024   PLT 190 01/07/2024   Most recent BMP    Latest Ref Rng & Units 01/08/2024    2:10 AM  BMP  Glucose 70 - 99 mg/dL 95   BUN 6 - 20 mg/dL 22   Creatinine 9.38 - 1.24 mg/dL 8.34   Sodium 864 - 854 mmol/L 140   Potassium 3.5 - 5.1 mmol/L 3.7   Chloride 98 - 111 mmol/L 102   CO2 22 - 32 mmol/L 26   Calcium 8.9 - 10.3 mg/dL 8.9    Lupie Credit, DO 01/08/2024, 7:55 AM  PGY-1, [facility] FPTS Intern pager: [phone], text pages welcome Secure chat group [facility] Teaching Service

## 2024-01-08 NOTE — Plan of Care (Signed)
 Had a family meeting at bedside with patient, wife, daughter in law. Son joined via speaker phone.   Discussed overall course of his prior hospitalizations, current treatment goals, and patient's wishes. Reviewed prior hospitalization and recent rehab experience. Patient reports he was doing well but continues to experience set backs from exacerbations of his cirrhosis.   Reviewed note from 12/03/2023 by Dr. Janna who called [facility] for transplant consideration. Per Dr. Landry Quale from [facility], patient would need to be able to perform 6-minute walk and be eligible for acute inpatient rehab in order to be considered for transplant. Discussed reasoning behind physical requirements due to the high physical demand of transplant surgery. He would potentially need a liver and kidney transplant due to his underlying kidney disease. Dr. Quale during this conversation emphasized the patient would require this physical level of fitness to have a chance of surviving this major surgery. On review of patient's own physical capabilities and progress during SNF admission, he acknowledges this goal is unlikely and that transplant would most likely be futile.   Explored the patient's understanding of prognosis. He understands this disease will eventually kill him. I confirmed this with the family and discussed that he meets hospice requirements and likely has weeks to months left to live. He disease is very progressed and we do not have any curative options as transplant is no longer possible.  Family thanked me for being so direct about prognosis and shared they did not understand the severity of his disease during prior admissions.   At the end of the conversation, I began to explore the concept of quantity verses quality of life with the family. The patient will need to discuss with family next steps in management including continuing PT, SNF, and current medical therapies. Will reach out to palliative to help with  exploring what symptomatic management would look like for this patient. I believe family will need a more detailed plan of comfort care management before making further decisions.   Answered all questions prior to leaving the room. Available as needed for additional conversations.   Damien Pinal, DO [facility], PGY-3 01/08/24 12:12 PM

## 2024-01-08 NOTE — Assessment & Plan Note (Addendum)
 Hx of hepatorenal syndrome. Cr at baseline.  - Continue to monitor kidney function  - Avoid nephrotoxic agents

## 2024-01-08 NOTE — Assessment & Plan Note (Addendum)
 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 - IR consulted, appreciate recs: - S/p thoracentesis with 1.5 L yellow cloudy fluid removed in ED - Plan for therapeutic paracentesis today 11/14 - Continue CTX 2mg /day for SBP for 7 days (11/12 - 11/18) as meets criteria for primary prophylaxis SBP even with blood cultures NGTD - Palliative consult, appreciate recs - PCCM consult, appreciate recs  -Patient ineligible for chest tube with cirrhosis related pleural effusion at this time, recommend TIPS procedure however patient has previously been denied due to elevated MELD score > 17 (23 this admission)  - Patient ineligible for diuresis with soft blood pressures and need for midodrine 3 times daily - Continue home midodrine 30 TID - Increase home lactulose  to 30 mg TID - Continue home rifaximin 550mg  BID - Continuous cardiac monitoring  - AM CMP, CBC - Family discussion on medical management and GOC plan 11/14  - Family would like some time to consider comfort care   - Palliative to discuss symptomatic management; will set up weekly paracentesis if comfort care - Salt restriction max of 2 g daily - Albumin  100g on day one 11/13 and 75g on day 3 11/15 to increase oncotic pressure - Possible source of infection? Lesion from prior paracentesis to left abdomen with ostomy bag intact, clean and dry, no fistula appreciated on CT scan, no leakage, continue to monitor, consider GI consult if begins leaking

## 2024-01-08 NOTE — Plan of Care (Signed)

## 2024-01-08 NOTE — Plan of Care (Signed)

## 2024-01-08 NOTE — Progress Notes (Signed)
 This chaplain responded to PMT NP-Shae consult for notarizing the Pt. Advance Directive:  HCPOA and Living Will. The Pt. is awake and able to review AD education with the chaplain. The Pt. daughter in law-Courtney and [name] are visiting at the bedside.  The Pt. is naming [place] as his healthcare agent. If this person is unable or unwilling the Pt. next choice is [name] Gander.  **1330 The chaplain is present with the Pt, notary, witnesses for the notarizing of the Pt. Advance Directive.   The chaplain gave the Pt. the original AD along with two copies. The chaplain scanned the Pt. AD into the Pt. EMR.  The chaplain is available for F/U spiritual care as needed.  Chaplain leeroy Hummer [phone]

## 2024-01-08 NOTE — Assessment & Plan Note (Addendum)
 Anxiety/depression: Continue home Lexapro 5mg  GERD: Continue home Protonix 40mg 

## 2024-01-09 DIAGNOSIS — R188 Other ascites: Secondary | ICD-10-CM | POA: Diagnosis not present

## 2024-01-09 DIAGNOSIS — K767 Hepatorenal syndrome: Secondary | ICD-10-CM | POA: Diagnosis not present

## 2024-01-09 DIAGNOSIS — J948 Other specified pleural conditions: Secondary | ICD-10-CM | POA: Diagnosis not present

## 2024-01-09 DIAGNOSIS — K7460 Unspecified cirrhosis of liver: Secondary | ICD-10-CM | POA: Diagnosis not present

## 2024-01-09 DIAGNOSIS — R54 Age-related physical debility: Secondary | ICD-10-CM | POA: Diagnosis not present

## 2024-01-09 DIAGNOSIS — K7290 Hepatic failure, unspecified without coma: Secondary | ICD-10-CM | POA: Diagnosis not present

## 2024-01-09 DIAGNOSIS — J90 Pleural effusion, not elsewhere classified: Secondary | ICD-10-CM | POA: Diagnosis not present

## 2024-01-09 DIAGNOSIS — Z7189 Other specified counseling: Secondary | ICD-10-CM | POA: Diagnosis not present

## 2024-01-09 LAB — COMPREHENSIVE METABOLIC PANEL WITH GFR
ALT: 15 U/L (ref 0–44)
AST: 34 U/L (ref 15–41)
Albumin: 2.9 g/dL — ABNORMAL LOW (ref 3.5–5.0)
Alkaline Phosphatase: 38 U/L (ref 38–126)
Anion gap: 11 (ref 5–15)
BUN: 18 mg/dL (ref 6–20)
CO2: 25 mmol/L (ref 22–32)
Calcium: 8.7 mg/dL — ABNORMAL LOW (ref 8.9–10.3)
Chloride: 102 mmol/L (ref 98–111)
Creatinine, Ser: 1.44 mg/dL — ABNORMAL HIGH (ref 0.61–1.24)
GFR, Estimated: 56 mL/min — ABNORMAL LOW (ref >60)
Glucose, Bld: 85 mg/dL (ref 70–99)
Potassium: 3.8 mmol/L (ref 3.5–5.1)
Sodium: 138 mmol/L (ref 135–145)
Total Bilirubin: 1.3 mg/dL — ABNORMAL HIGH (ref 0.0–1.2)
Total Protein: 6.1 g/dL — ABNORMAL LOW (ref 6.5–8.1)

## 2024-01-09 LAB — BODY FLUID CULTURE W GRAM STAIN
Culture: NO GROWTH
Culture: NO GROWTH
Gram Stain: NONE SEEN
Gram Stain: NONE SEEN

## 2024-01-09 LAB — PROTIME-INR
INR: 1.6 — ABNORMAL HIGH (ref 0.8–1.2)
Prothrombin Time: 20.3 s — ABNORMAL HIGH (ref 11.4–15.2)

## 2024-01-09 LAB — GLUCOSE, CAPILLARY: Glucose-Capillary: 91 mg/dL (ref 70–99)

## 2024-01-09 NOTE — Assessment & Plan Note (Signed)
 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 - IR consulted, appreciate recs: - S/p thoracentesis with 1.5 L yellow cloudy fluid removed in ED - S/p therapeutic paracentesis on 11/14 w/ 1.2 L fluid removal  - Continue CTX 2mg /day for SBP for 7 days (11/12 - 11/18) as meets criteria for primary prophylaxis SBP even with blood cultures NGTD - Palliative consult, appreciate recs - PCCM consult, appreciate recs  -Patient ineligible for chest tube with cirrhosis related pleural effusion at this time, recommend TIPS procedure however patient has previously been denied due to elevated MELD score > 17 (23 this admission)  - Patient ineligible for diuresis with soft blood pressures and need for midodrine 3 times daily - Continue home midodrine 30 TID - Continue home lactulose  to 30 mg TID - Continue home rifaximin 550mg  BID - Continuous cardiac monitoring  - AM CMP, CBC - Family discussion on medical management and GOC plan 11/14  - Family would like some time to consider comfort care   - Palliative to discuss symptomatic management; will set up weekly paracentesis if comfort care - Salt restriction max of 2 g daily - Albumin  100g on day one 11/13 and 75g on day 3 11/15 to increase oncotic pressure - Possible source of infection? Lesion from prior paracentesis to left abdomen with ostomy bag intact, clean and dry, no fistula appreciated on CT scan, no leakage, continue to monitor, consider GI consult if begins leaking

## 2024-01-09 NOTE — Progress Notes (Signed)
 Daily Progress Note Intern Pager: [phone]  Patient name: James Downs Medical record number: [id number] Date of birth: 03/01/1965 Age: 58 y.o. Gender: male  Primary Care Provider: Patient, No Pcp Per Consultants: IR, PCCM Code Status: DNR-limited  Pt Overview and Major Events to Date:  11/12: Admitted for decompensated cirrhosis with pleural effusion and ascites 11/12: Diagnostic thoracentesis yielding 1.5L of cloudy, yellow fluid; diagnostic RLQ paracentesis yielding of cloudy, yellow fluid  11/14: Therapeutic paracentesis planned     James Downs is a 58 year old male admitted for decompensated cirrhosis with pleural effusion and ascites.  He presented with worsening abdominal pain over the last week.  In the ED he was found with moderate ascites and a large right pleural effusion.  IR did a diagnostic thoracentesis pulling 1.5 L of milky white fluid.  Patient had a mild leukocytosis mildly elevated LFTs and an elevated lactic acid of 3.0. Per chart review, patient most recently admitted to [facility] (10/28-11/5/25) for similar presentation, received empiric treatment for SBP (peritoneal culture was negative though abx were given prior to collection), underwent multiple paracenteses, and was started on oral SBP prophylaxis (ciprofloxacin) upon discharge.  Pertinent PMH/PSH includes nonalcoholic cirrhosis, CKD.  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 - IR consulted, appreciate recs: - S/p thoracentesis with 1.5 L yellow cloudy fluid removed in ED - S/p therapeutic paracentesis on 11/14 w/ 1.2 L fluid removal  - Continue CTX 2mg /day for SBP for 7 days (11/12 - 11/18) as meets criteria for primary prophylaxis SBP even with blood cultures NGTD - Palliative consult, appreciate recs - PCCM consult,  appreciate recs  -Patient ineligible for chest tube with cirrhosis related pleural effusion at this time, recommend TIPS procedure however patient has previously been denied due to elevated MELD score > 17 (23 this admission)  - Patient ineligible for diuresis with soft blood pressures and need for midodrine 3 times daily - Continue home midodrine 30 TID - Continue home lactulose  to 30 mg TID - Continue home rifaximin 550mg  BID - Continuous cardiac monitoring  - AM CMP, CBC - Family discussion on medical management and GOC plan 11/14  - Family would like some time to consider comfort care   - Palliative to discuss symptomatic management; will set up weekly paracentesis if comfort care - Salt restriction max of 2 g daily - Albumin  100g on day one 11/13 and 75g on day 3 11/15 to increase oncotic pressure - Possible source of infection? Lesion from prior paracentesis to left abdomen with ostomy bag intact, clean and dry, no fistula appreciated on CT scan, no leakage, continue to monitor, consider GI consult if begins leaking Hepatorenal syndrome (HCC) Hx of hepatorenal syndrome. Cr at baseline.  - Continue to monitor kidney function  - Avoid nephrotoxic agents  Chronic health problem Anxiety/depression: Continue home Lexapro 5mg  GERD: Continue home Protonix 40mg   FEN/GI: Regular diet PPx: SCDs Dispo: Home pending clinical improvement .   Subjective:  NAEON Pt rest comfortable in bed this morning.   Objective: Temp:  [97.9 F (36.6 C)-98.2 F (36.8 C)] 97.9 F (36.6 C) (11/15 0400) Pulse Rate:  [54-70] 63 (11/15 0400) Resp:  [17-26] 20 (11/15 0400) BP: (114-126)/(68-79) 116/68 (11/15 0400) SpO2:  [93 %-98 %] 95 % (11/15 0400) Physical Exam: General: chronically ill appearing male lying comfortably supine in bed in no acute distress Cardiovascular: RRR,  no murmurs, 2+ radial pulses  Respiratory: CTAB, normal WOB on 1L [place] O2 Abdomen: soft, diffusely tender, BS  present Extremities: no peripheral edema, moves all extremities equally  Laboratory: Most recent CBC Lab Results  Component Value Date   WBC 10.0 01/07/2024   HGB 10.3 (L) 01/07/2024   HCT 30.3 (L) 01/07/2024   MCV 94.7 01/07/2024   PLT 190 01/07/2024   Most recent BMP    Latest Ref Rng & Units 01/09/2024    4:01 AM  BMP  Glucose 70 - 99 mg/dL 85   BUN 6 - 20 mg/dL 18   Creatinine 9.38 - 1.24 mg/dL 8.55   Sodium 864 - 854 mmol/L 138   Potassium 3.5 - 5.1 mmol/L 3.8   Chloride 98 - 111 mmol/L 102   CO2 22 - 32 mmol/L 25   Calcium 8.9 - 10.3 mg/dL 8.7    Suzen Elder B, DO 01/09/2024, 5:23 AM  PGY-1, [place] [facility] FPTS Intern pager: [phone], text pages welcome Secure chat group [facility] Teaching Service

## 2024-01-09 NOTE — Plan of Care (Signed)
  Problem: Education: Goal: Knowledge of General Education information will improve Description: Including pain rating scale, medication(s)/side effects and non-pharmacologic comfort measures Outcome: Progressing   Problem: Health Behavior/Discharge Planning: Goal: Ability to manage health-related needs will improve Outcome: Progressing   Problem: Clinical Measurements: Goal: Ability to maintain clinical measurements within normal limits will improve Outcome: Progressing Goal: Diagnostic test results will improve Outcome: Progressing Goal: Respiratory complications will improve Outcome: Progressing   Problem: Activity: Goal: Risk for activity intolerance will decrease Outcome: Progressing   

## 2024-01-09 NOTE — TOC Progression Note (Signed)
 Transition of Care [facility]) - Progression Note    Patient Details  Name: James Downs MRN: [id number] Date of Birth: 04/10/1965  Transition of Care [facility]) CM/SW Contact  Isaiah Public, LCSWA Phone Number: 01/09/2024, 1:54 PM  Clinical Narrative:     Plan for patient to return to [facility] when medically ready. CSW will continue to follow.  Expected Discharge Plan: Skilled Nursing Facility Barriers to Discharge: Continued Medical Work up               Expected Discharge Plan and Services In-house Referral: Clinical Social Work   Post Acute Care Choice: Skilled Nursing Facility Living arrangements for the past 2 months: Skilled Nursing Facility                                       Social Drivers of Health (SDOH) Interventions SDOH Screenings   Food Insecurity: No Food Insecurity (01/06/2024)  Recent Concern: Food Insecurity - Food Insecurity Present (01/06/2024)  Housing: Unknown (01/06/2024)  Recent Concern: Housing - High Risk (01/06/2024)  Transportation Needs: No Transportation Needs (01/06/2024)  Utilities: Not At Risk (01/06/2024)  Social Connections: Moderately Integrated (01/06/2024)  Tobacco Use: High Risk (01/06/2024)    Readmission Risk Interventions     No data to display

## 2024-01-09 NOTE — Assessment & Plan Note (Signed)
 Hx of hepatorenal syndrome. Cr at baseline.  - Continue to monitor kidney function  - Avoid nephrotoxic agents

## 2024-01-09 NOTE — Progress Notes (Signed)
 Patient arrived in the unit, he is alert and oriented, V/S obtained, CCMD notified, complains of abdominal pain, will give PRN, all needs met, call bell in reach.    01/09/24 1708  Vitals  Temp 98.3 F (36.8 C)  Temp Source Oral  BP 118/79  MAP (mmHg) 92  BP Location Right Arm  BP Method Automatic  Patient Position (if appropriate) Lying  Pulse Rate 62  Pulse Rate Source Monitor  ECG Heart Rate 64  Resp 17  Level of Consciousness  Level of Consciousness Alert  Oxygen Therapy  SpO2 95 %  O2 Device Nasal Cannula  O2 Flow Rate (L/min) 2 L/min  Pain Assessment  Pain Scale 0-10  Pain Score 6  Pain Type Acute pain  Pain Location Abdomen  Pain Descriptors / Indicators Aching  Pain Frequency Intermittent  Pain Onset Gradual  Patients Stated Pain Goal 0  Pain Intervention(s) Medication (See eMAR)

## 2024-01-09 NOTE — Progress Notes (Signed)
 Daily Progress Note   Patient Name: James Downs       Date: 01/09/2024 DOB: 01/27/1966  Age: 58 y.o. MRN#: [id number] Attending Physician: McDiarmid, Krystal BIRCH, MD Primary Care Physician: Patient, No Pcp Per Admit Date: 01/05/2024  Reason for Consultation/Follow-up: Establishing goals of care  Subjective: Medical records reviewed including progress notes, labs, imaging. Patient assessed at the bedside.  He reports fatigue and abdominal pain.  Recently took pain medicine prior to my arrival and now down to a 7 out of 10 (was at 8.5 out of 10).  Discussed with RN.  [name] son is present visiting.  Created space and opportunity for patient and family's thoughts and feelings on patient's current illness.  Patient shares that after yesterday's meeting with his doctors, he has decided to keep trying and see how it goes.  He feels like he already has a good understanding of what comfort care entails.    [name] son was not present at yesterday's meeting and we reviewed overall prognosis, limitations to current treatment options, and differences between current care plan and full comfort focused care.  He is appreciative of the clarification.  He is supportive of patient's wishes and does not have other questions.  They are expecting transfer to 4 E. discussed with primary team.  No other needs at this time.  Questions and concerns addressed. PMT will continue to support holistically.   Length of Stay: 3   Physical Exam Vitals and nursing note reviewed.  Constitutional:      General: He is not in acute distress.    Appearance: He is ill-appearing.  HENT:     Head: Normocephalic and atraumatic.  Cardiovascular:     Rate and Rhythm: Normal rate.  Neurological:     Mental Status: He is alert. Mental status is at baseline.   Psychiatric:        Behavior: Behavior normal.        Cognition and Memory: Cognition normal.            Vital Signs: BP 115/72 (BP Location: Right Arm)   Pulse 61   Temp 98 F (36.7 C) (Oral)   Resp 18   SpO2 95%  SpO2: SpO2: 95 % O2 Device: O2 Device: Nasal Cannula O2 Flow Rate: O2 Flow Rate (L/min): 2 L/min      Palliative Care Assessment & Plan   Patient Profile: 58 y.o. male  with past medical history of non-alcoholic decompensated cirrhosis, hepatorenal syndrome, esophageal varices, gastric ulcer, portal HTN, HLD, nephrolithiasis admitted on 01/05/2024 with abd pain with fistula following recent paracentesis, decompensated cirrhosis, ascites, pleural  effusion. Recent admission to [facility] for paracentesis. Was previously at [facility] for rehab  -[facility]. Seen by our palliative team multiple times during last admission here in early October. PMT consulted for GOC.   Assessment: Goals of care conversation Decompensated cirrhosis, end-stage Hepatorenal syndrome Esophageal varices Recurrent ascites  Recommendations/Plan: Patient would like to continue with supportive care and hopes for as much medical stabilization as possible.  He is not ready for full comfort focused care or hospice philosophy Patient remains appropriate for outpatient palliative care follow-up Psychosocial and emotional support provided PMT remains available as needed   Prognosis:  < 6 months  Discharge Planning: SNF  Care plan was discussed with patient, patient's son, primary team, RN    James Tinch SHAUNNA Fell, PA-C  Palliative Medicine Team Team phone # [phone]  Thank you for allowing the Palliative Medicine Team to assist in the care of this patient. Please utilize secure chat with additional questions, if there is no response within 30 minutes please call the above phone number.  Palliative Medicine Team providers are available by phone from 7am to 7pm daily and can be reached through  the team cell phone.  Should this patient require assistance outside of these hours, please call the patient's attending physician.    Time Total: 50  Visit consisted of counseling and education dealing with the complex and emotionally intense issues of symptom management and palliative care in the setting of serious and potentially life-threatening illness. Greater than 50% of this time was spent counseling and coordinating care related to the above assessment and plan.  Personally spent 50 minutes in patient care including extensive chart review (labs, imaging, progress/consult notes, vital signs), medically appropraite exam, discussed with treatment team, education to patient, family, and staff, documenting clinical information, medication review and management, coordination of care, and available advanced directive documents.

## 2024-01-09 NOTE — Assessment & Plan Note (Signed)
 Anxiety/depression: Continue home Lexapro 5mg  GERD: Continue home Protonix 40mg 

## 2024-01-09 NOTE — Progress Notes (Signed)
 PT Cancellation Note  Patient Details Name: James Downs MRN: [id number] DOB: 07/14/1965   Cancelled Treatment:    Reason Eval/Treat Not Completed: (P) Fatigue/lethargy limiting ability to participate;Other (comment) (pt defers PT session at this time due to abdominal discomfort and wanting to rest. PTA assisted him to place pillow between his limbs to support L hip as pt sidelying toward his R, pt reports this is more comfortable.) Will continue efforts per PT plan of care as schedule permits.   Connell CHRISTELLA Kaelah Hayashi 01/09/2024, 3:16 PM

## 2024-01-10 DIAGNOSIS — R188 Other ascites: Secondary | ICD-10-CM | POA: Diagnosis not present

## 2024-01-10 DIAGNOSIS — Z7189 Other specified counseling: Secondary | ICD-10-CM | POA: Diagnosis not present

## 2024-01-10 DIAGNOSIS — K7290 Hepatic failure, unspecified without coma: Secondary | ICD-10-CM | POA: Diagnosis not present

## 2024-01-10 DIAGNOSIS — J90 Pleural effusion, not elsewhere classified: Secondary | ICD-10-CM | POA: Diagnosis not present

## 2024-01-10 DIAGNOSIS — K7460 Unspecified cirrhosis of liver: Secondary | ICD-10-CM | POA: Diagnosis not present

## 2024-01-10 DIAGNOSIS — R54 Age-related physical debility: Secondary | ICD-10-CM | POA: Diagnosis not present

## 2024-01-10 DIAGNOSIS — J948 Other specified pleural conditions: Secondary | ICD-10-CM | POA: Diagnosis not present

## 2024-01-10 LAB — COMPREHENSIVE METABOLIC PANEL WITH GFR
ALT: 14 U/L (ref 0–44)
AST: 31 U/L (ref 15–41)
Albumin: 3.5 g/dL (ref 3.5–5.0)
Alkaline Phosphatase: 38 U/L (ref 38–126)
Anion gap: 11 (ref 5–15)
BUN: 15 mg/dL (ref 6–20)
CO2: 25 mmol/L (ref 22–32)
Calcium: 8.9 mg/dL (ref 8.9–10.3)
Chloride: 101 mmol/L (ref 98–111)
Creatinine, Ser: 1.25 mg/dL — ABNORMAL HIGH (ref 0.61–1.24)
GFR, Estimated: >60 mL/min (ref >60)
Glucose, Bld: 88 mg/dL (ref 70–99)
Potassium: 3.9 mmol/L (ref 3.5–5.1)
Sodium: 137 mmol/L (ref 135–145)
Total Bilirubin: 2 mg/dL — ABNORMAL HIGH (ref 0.0–1.2)
Total Protein: 6.6 g/dL (ref 6.5–8.1)

## 2024-01-10 MED ORDER — CIPROFLOXACIN HCL 500 MG PO TABS
500.0000 mg | ORAL_TABLET | Freq: Every morning | ORAL | Status: DC
  Administered 2024-01-11: 500 mg via ORAL
  Filled 2024-01-10: qty 1

## 2024-01-10 MED ORDER — ACETAMINOPHEN 500 MG PO TABS
500.0000 mg | ORAL_TABLET | Freq: Three times a day (TID) | ORAL | Status: DC
  Administered 2024-01-10 – 2024-01-11 (×5): 500 mg via ORAL
  Filled 2024-01-10 (×5): qty 1

## 2024-01-10 NOTE — Assessment & Plan Note (Signed)
 Hx of hepatorenal syndrome. Cr at baseline.  - Continue to monitor kidney function  - Avoid nephrotoxic agents

## 2024-01-10 NOTE — TOC Progression Note (Addendum)
 Transition of Care [facility]) - Progression Note    Patient Details  Name: James Downs MRN: [id number] Date of Birth: [birth year]  Transition of Care [facility]) CM/SW Contact  Isaiah Public, LCSWA Phone Number: 01/10/2024, 12:04 PM  Clinical Narrative:     MD informed CSW patient may be ready for dc tomorrow.CSW unable to get a hold of anyone in admissions at [facility] to confirm SNF bed for tomorrow. CSW to follow up in the morning to confirm SNF bed.  Expected Discharge Plan: Skilled Nursing Facility Barriers to Discharge: Continued Medical Work up               Expected Discharge Plan and Services In-house Referral: Clinical Social Work   Post Acute Care Choice: Skilled Nursing Facility Living arrangements for the past 2 months: Skilled Nursing Facility                                       Social Drivers of Health (SDOH) Interventions SDOH Screenings   Food Insecurity: No Food Insecurity (01/06/2024)  Recent Concern: Food Insecurity - Food Insecurity Present (01/06/2024)  Housing: Unknown (01/06/2024)  Recent Concern: Housing - High Risk (01/06/2024)  Transportation Needs: No Transportation Needs (01/06/2024)  Utilities: Not At Risk (01/06/2024)  Social Connections: Moderately Integrated (01/06/2024)  Tobacco Use: High Risk (01/06/2024)    Readmission Risk Interventions     No data to display

## 2024-01-10 NOTE — Progress Notes (Signed)
 Daily Progress Note Intern Pager: [phone]  Patient name: James Downs Medical record number: [id number] Date of birth: 01/17/1966 Age: 58 y.o. 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 Pertinent PMH/PSH includes nonalcoholic cirrhosis, CKD.   Patient improving. In terms of hydrothorax, patient no longer has shortness of breath and was able to be weaned off of oxygen.  Cr has improved. However, bilirubin increased from 1.3 to 2.0 today.  Assessment & Plan Decompensated cirrhosis (HCC) Hydrothorax Ascites MELD score 23.  - Continue CTX 2mg /day for SBP for 7 days (11/12 - 11/18) as meets criteria for empiric treatment SBP even with blood cultures NGTD - Palliative consult, appreciate recs - PCCM consult, appreciate recs  - Patient ineligible for diuresis with soft blood pressures and need for midodrine 3 times daily - Continue home midodrine 30 TID - Continue home lactulose  to 30 mg TID - Continue home rifaximin 550mg  BID - tylenol  500 mg every 6 hours as needed, oxycodone  5 mg every 4 hours as needed  - Continuous cardiac monitoring  - AM CMP, CBC - Salt restriction max of 2 g daily Hepatorenal syndrome (HCC) Hx of hepatorenal syndrome. Cr at baseline.  - Continue to monitor kidney function  - Avoid nephrotoxic agents  Chronic health  problem Anxiety/depression: Continue home Lexapro 5mg  GERD: Continue home Protonix 40mg   FEN/GI: Regular diet PPx: SCDs Dispo:Home pending clinical improvement   Subjective:  Patient says he feels similarly to yesterday. Denies trouble breathing. Endorses 8/10 abdominal pain similar to yesterday. Denies feeling more distended.   Objective: Temp:  [97.6 F (36.4 C)-98.3 F (36.8 C)] 98 F (36.7 C) (11/15 2310) Pulse Rate:  [61-68] 62 (11/15 1708) Resp:  [17-22] 20 (11/15 2310) BP: (114-130)/(68-79) 130/78 (11/15 2310) SpO2:  [93 %-99 %] 99 % (11/15 2020) Physical Exam: General: Chronically ill man lying in bed, in no acute distress  Cardiovascular: RRR Respiratory: Normal work of breathing on initially 4L O2 then also on room air, CTAB, no crackles  Abdomen: Tender to palpation diffused with guarding to light palpation, non distended Extremities: No BLE edema   Laboratory: Most recent CBC Lab Results  Component Value Date   WBC 10.0 01/07/2024   HGB 10.3 (L) 01/07/2024   HCT 30.3 (L) 01/07/2024   MCV 94.7 01/07/2024   PLT 190 01/07/2024   Most recent BMP    Latest Ref Rng & Units 01/09/2024    4:01 AM  BMP  Glucose 70 - 99 mg/dL 85   BUN 6 - 20 mg/dL 18   Creatinine 9.38 - 1.24 mg/dL 8.55   Sodium 864 - 854 mmol/L 138   Potassium 3.5 - 5.1 mmol/L 3.8   Chloride 98 - 111 mmol/L 102   CO2 22 - 32 mmol/L 25   Calcium 8.9 - 10.3 mg/dL 8.7    AST/ALT 68/85, bilirubin 2.0    Nicholas Bar, MD 01/10/2024, 1:03 AM  PGY-3, [place]  Family Medicine FPTS Intern pager: [phone], text pages welcome Secure chat group [facility] Teaching Service

## 2024-01-10 NOTE — Assessment & Plan Note (Signed)
 Anxiety/depression: Continue home Lexapro 5mg  GERD: Continue home Protonix 40mg 

## 2024-01-10 NOTE — Assessment & Plan Note (Addendum)
 MELD score 23.  - Continue CTX 2mg /day for SBP for 7 days (11/12 - 11/18) as meets criteria for empiric treatment SBP even with blood cultures NGTD - Palliative consult, appreciate recs - PCCM consult, appreciate recs  - Patient ineligible for diuresis with soft blood pressures and need for midodrine 3 times daily - Continue home midodrine 30 TID - Continue home lactulose  to 30 mg TID - Continue home rifaximin 550mg  BID - tylenol  500 mg every 6 hours as needed, oxycodone  5 mg every 4 hours as needed  - Continuous cardiac monitoring  - AM CMP, CBC - Salt restriction max of 2 g daily

## 2024-01-11 DIAGNOSIS — K7682 Hepatic encephalopathy: Secondary | ICD-10-CM | POA: Diagnosis not present

## 2024-01-11 DIAGNOSIS — F411 Generalized anxiety disorder: Secondary | ICD-10-CM | POA: Diagnosis not present

## 2024-01-11 DIAGNOSIS — L871 Reactive perforating collagenosis: Secondary | ICD-10-CM | POA: Diagnosis not present

## 2024-01-11 DIAGNOSIS — K7581 Nonalcoholic steatohepatitis (NASH): Secondary | ICD-10-CM | POA: Diagnosis not present

## 2024-01-11 DIAGNOSIS — F32A Depression, unspecified: Secondary | ICD-10-CM | POA: Diagnosis not present

## 2024-01-11 DIAGNOSIS — I959 Hypotension, unspecified: Secondary | ICD-10-CM | POA: Diagnosis not present

## 2024-01-11 DIAGNOSIS — E440 Moderate protein-calorie malnutrition: Secondary | ICD-10-CM | POA: Diagnosis not present

## 2024-01-11 DIAGNOSIS — Z515 Encounter for palliative care: Secondary | ICD-10-CM | POA: Diagnosis not present

## 2024-01-11 DIAGNOSIS — G4733 Obstructive sleep apnea (adult) (pediatric): Secondary | ICD-10-CM | POA: Diagnosis not present

## 2024-01-11 DIAGNOSIS — K7460 Unspecified cirrhosis of liver: Secondary | ICD-10-CM | POA: Diagnosis not present

## 2024-01-11 DIAGNOSIS — R188 Other ascites: Secondary | ICD-10-CM | POA: Diagnosis not present

## 2024-01-11 DIAGNOSIS — N189 Chronic kidney disease, unspecified: Secondary | ICD-10-CM | POA: Diagnosis not present

## 2024-01-11 DIAGNOSIS — K767 Hepatorenal syndrome: Secondary | ICD-10-CM | POA: Diagnosis not present

## 2024-01-11 DIAGNOSIS — K7290 Hepatic failure, unspecified without coma: Secondary | ICD-10-CM | POA: Diagnosis not present

## 2024-01-11 DIAGNOSIS — G4700 Insomnia, unspecified: Secondary | ICD-10-CM | POA: Diagnosis not present

## 2024-01-11 DIAGNOSIS — E785 Hyperlipidemia, unspecified: Secondary | ICD-10-CM | POA: Diagnosis not present

## 2024-01-11 DIAGNOSIS — E1122 Type 2 diabetes mellitus with diabetic chronic kidney disease: Secondary | ICD-10-CM | POA: Diagnosis not present

## 2024-01-11 DIAGNOSIS — Z7982 Long term (current) use of aspirin: Secondary | ICD-10-CM | POA: Diagnosis not present

## 2024-01-11 DIAGNOSIS — K644 Residual hemorrhoidal skin tags: Secondary | ICD-10-CM | POA: Diagnosis not present

## 2024-01-11 DIAGNOSIS — F17210 Nicotine dependence, cigarettes, uncomplicated: Secondary | ICD-10-CM | POA: Diagnosis not present

## 2024-01-11 DIAGNOSIS — I129 Hypertensive chronic kidney disease with stage 1 through stage 4 chronic kidney disease, or unspecified chronic kidney disease: Secondary | ICD-10-CM | POA: Diagnosis not present

## 2024-01-11 DIAGNOSIS — R4182 Altered mental status, unspecified: Secondary | ICD-10-CM | POA: Diagnosis not present

## 2024-01-11 LAB — CULTURE, BLOOD (ROUTINE X 2)
Culture: NO GROWTH
Culture: NO GROWTH
Special Requests: ADEQUATE

## 2024-01-11 MED ORDER — OXYCODONE HCL 5 MG PO TABS: 5.0000 mg | ORAL_TABLET | ORAL | Status: DC | PRN

## 2024-01-11 MED ORDER — OXYCODONE HCL 5 MG PO TABS: 5.0000 mg | ORAL_TABLET | ORAL | 0 refills | Status: AC | PRN

## 2024-01-11 MED ORDER — OXYCODONE HCL 5 MG PO TABS: 5.0000 mg | ORAL_TABLET | ORAL | 0 refills | Status: DC | PRN

## 2024-01-11 MED ORDER — LACTULOSE 10 GM/15ML PO SOLN: 30.0000 g | Freq: Three times a day (TID) | ORAL | Status: AC

## 2024-01-11 NOTE — Assessment & Plan Note (Addendum)
 MELD score 23.  - Continue CTX 2mg /day for SBP for 7 days (11/12 - 11/18) as meets criteria for empiric treatment SBP even with blood cultures NGTD - Palliative consult, appreciate recs  - family requesting residential hospice  - PCCM consult, appreciate recs  - Patient ineligible for diuresis with soft blood pressures and need for midodrine 3 times daily - Continue home midodrine 30 TID - Continue home lactulose  30 mg TID - Continue home rifaximin 550mg  BID - tylenol  500 mg every 6 hours as needed, oxycodone  5 mg every 4 hours as needed  - Continuous cardiac monitoring  Lab holiday - Salt restriction max of 2 g daily

## 2024-01-11 NOTE — Plan of Care (Signed)
  Problem: Activity: Goal: Risk for activity intolerance will decrease Outcome: Progressing   Problem: Coping: Goal: Level of anxiety will decrease Outcome: Progressing   Problem: Elimination: Goal: Will not experience complications related to bowel motility Outcome: Progressing

## 2024-01-11 NOTE — Progress Notes (Signed)
 Daily Progress Note   Patient Name: James Downs       Date: 01/11/2024 DOB: Aug 27, 1965  Age: 58 y.o. MRN#: [id number] Attending Physician: McDiarmid, Krystal BIRCH, MD Primary Care Physician: Patient, No Pcp Per Admit Date: 01/05/2024  Reason for Consultation/Follow-up: Establishing goals of care  Patient Profile/HPI: 58 y.o. male with past medical history of non-alcoholic decompensated cirrhosis, hepatorenal syndrome, esophageal varices, gastric ulcer, portal HTN, HLD, nephrolithiasis admitted on 01/05/2024 with abd pain with fistula following recent paracentesis, decompensated cirrhosis, ascites, pleural effusion.Concern for hepatorenal with elevated creatinine. Recent admission to [facility] for paracentesis. Was previously at [facility] for rehab -[facility]. Seen by our palliative team multiple times during last admission here in early October. PMT consulted for GOC.   Subjective: Chart reviewed including labs, progress notes, imaging from this and previous encounters.  CR from yesterday in down to 1.25. Bili 2.0.  Discussed with attending team MD and social work. [name] daughter in law- James Downs inquiring about hospice. Palliative team asked to discuss with family.  I met at the bedside with [facility] and patient.  We discussed inpatient hospice which would require transition to full comfort measures only. Discussed transition to comfort measures only which includes stopping IV fluids, antibiotics, labs and providing symptom management for SOB, anxiety, nausea, vomiting, and other symptoms of dying.  We also discussed inpatient hospice requirements - likely death within 2 weeks and the need for IV symptom management.  At this point I don't think patient would be approved for inpatient hospice.   We discussed hospice philosophy and services outside of inpatient hospice.  Discussed what patient's overall goals of care.  He expressed that he is not ready for hospice care. He wishes to continue life prolonging interventions, get PT at facility.  James Downs shared that facility told her they have hospice and patient can be transitioned to LTC and hospice if he has decline and chooses.  Patient is in agreement with this plan.   Review of Systems  All other systems reviewed and are negative.    Physical Exam Vitals and nursing note reviewed.  Constitutional:      Appearance: He is ill-appearing.  Cardiovascular:     Rate and Rhythm: Normal rate.  Pulmonary:     Effort: Pulmonary effort is normal.  Neurological:     Mental  Status: He is alert.             Vital Signs: BP 125/69 (BP Location: Right Arm)   Pulse 66   Temp (!) 97.5 F (36.4 C)   Resp 17   SpO2 95%  SpO2: SpO2: 95 % O2 Device: O2 Device: Room Air O2 Flow Rate: O2 Flow Rate (L/min): 2 L/min  Intake/output summary:  Intake/Output Summary (Last 24 hours) at 01/11/2024 1227 Last data filed at 01/10/2024 1702 Gross per 24 hour  Intake --  Output 550 ml  Net -550 ml   LBM: Last BM Date : 01/10/24 Baseline Weight:   Most recent weight:         Patient Active Problem List   Diagnosis Date Noted   Frailty 01/08/2024   Fistula between jejunum and skin 01/07/2024   Decompensated cirrhosis (HCC) 01/06/2024   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   Ascites 01/06/2024   Elevated lactic acid level 01/06/2024   Difficulty sleeping 12/10/2023   Chronic health problem 12/08/2023   Malnutrition of moderate degree 12/08/2023   Hepatic encephalopathy (HCC) 12/05/2023   Goals of care, counseling/discussion 12/05/2023   Palliative care by specialist 12/05/2023   AKI (acute kidney injury) 11/28/2023   Depression 11/25/2023   SBP (spontaneous bacterial peritonitis) (HCC) 11/24/2023   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    T2DM (type 2 diabetes mellitus) (HCC) 11/23/2023   Cirrhosis of liver with ascites (HCC) 11/23/2023   Hepatorenal syndrome (HCC) 11/23/2023   Black stool 11/23/2023   Esophageal varices in cirrhosis (HCC) 11/23/2023   Anasarca 05/19/2023   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   Weakness of left leg 11/04/2013   Paresthesias 11/04/2013   Lumbar radiculopathy 11/04/2013   Degenerative disc disease, lumbar 11/04/2013   ONYCHOMYCOSIS 12/23/2006   HYPERLIPIDEMIA 12/23/2006   ANXIETY DISORDER, GENERALIZED 12/23/2006   TOBACCO ABUSE 12/23/2006   SYNDROME, CARPAL TUNNEL 12/23/2006   HYPERTENSION 12/23/2006   NEPHROLITHIASIS, HX OF 12/23/2006    Palliative Care Assessment & Plan    Assessment/Recommendations/Plan  Decompensated cirrhosis with hepatorenal syndrome and ascites- stable for now- plan to continue life prolonging measures, d/c to SNF/rehab, transition to hospice, comfort if fails to thrive.    Code Status:   Code Status: Limited: Do not attempt resuscitation (DNR) -DNR-LIMITED -Do Not Intubate/DNI    Prognosis:  < 12 months  Discharge Planning: To Be Determined  Care plan was discussed with patient, family, attending MD and social work.   Thank you for allowing the Palliative Medicine Team to assist in the care of this patient.  Total time:  60 mins Prolonged billing:  Time includes:   Preparing to see the patient (e.g., review of tests) Obtaining and/or reviewing separately obtained history Performing a medically necessary appropriate examination and/or evaluation Counseling and educating the patient/family/caregiver Ordering medications, tests, or procedures Referring and communicating with other health care professionals (when not reported separately) Documenting clinical information in the electronic or other health record Independently interpreting results (not reported separately) and communicating results to the  patient/family/caregiver Care coordination (not reported separately) Clinical documentation  Cassondra Stain, AGNP-C Palliative Medicine   Please contact Palliative Medicine Team phone at [phone] for questions and concerns.

## 2024-01-11 NOTE — Progress Notes (Signed)
 Patient discharged back to [facility], transported by Daughter. Attempted to call report was transferred x 2 no reply. Discharge packet sent with patient.

## 2024-01-11 NOTE — Assessment & Plan Note (Signed)
 Anxiety/depression: Continue home Lexapro 5mg  GERD: Continue home Protonix 40mg 

## 2024-01-11 NOTE — Progress Notes (Signed)
     Daily Progress Note Intern Pager: [phone]  Patient name: James Downs Medical record number: [id number] Date of birth: 04/14/1965 Age: 58 y.o. 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 Pertinent PMH/PSH includes nonalcoholic cirrhosis, CKD.    Patient remains stable on RA, awaiting SNF placement back at [facility] pending bed placement and weekly paracentesis outpatient.  Assessment & Plan Decompensated cirrhosis (HCC) Hydrothorax Ascites MELD score 23.  - Continue CTX 2mg /day for SBP for 7 days (11/12 - 11/18) as meets criteria for empiric treatment SBP even with blood cultures NGTD - Palliative consult, appreciate recs  - family requesting residential hospice  - PCCM consult, appreciate recs  - Patient ineligible for diuresis with soft blood pressures and need for midodrine 3 times daily - Continue home midodrine 30 TID - Continue home lactulose  30 mg TID - Continue home rifaximin 550mg  BID - tylenol  500 mg every 6 hours as needed, oxycodone  5 mg every 4 hours as needed  - Continuous cardiac monitoring  Lab holiday - Salt restriction max of 2 g daily Hepatorenal syndrome (HCC) Hx of hepatorenal syndrome. Cr at baseline.  - Continue to monitor kidney function  - Avoid nephrotoxic agents  Chronic health problem Anxiety/depression:  Continue home Lexapro 5mg  GERD: Continue home Protonix 40mg   FEN/GI: Regular diet PPx: SCDs Dispo:SNF today. Barriers include family wanting residential hospice - discussion to follow.   Subjective:  Patient was seen and examined at bedside. He states he feels somewhat better than on admission, abdominal pain better.   Objective: Temp:  [97.5 F (36.4 C)-98.5 F (36.9 C)] 97.5 F (36.4 C) (11/17 0812) Pulse Rate:  [59-66] 66 (11/17 0812) Resp:  [17-20] 17 (11/17 0812) BP: (114-142)/(69-91) 125/69 (11/17 0812) SpO2:  [95 %-97 %] 95 % (11/17 9187) Physical Exam: General: lying supine comfortably in hospital bed in no acute distress Cardiovascular: distant heart sounds, no murmur, 2+ radial pulses Respiratory: CTAB, normal WOB on RA Abdomen: mild diffuse tenderness, BS present, soft, non-distended Extremities: no peripheral edema, moves all extremities equally  Laboratory: Most recent CBC Lab Results  Component Value Date   WBC 10.0 01/07/2024   HGB 10.3 (L) 01/07/2024   HCT 30.3 (L) 01/07/2024   MCV 94.7 01/07/2024   PLT 190 01/07/2024   Most recent BMP    Latest Ref Rng & Units 01/10/2024    3:50 AM  BMP  Glucose 70 - 99 mg/dL 88   BUN 6 - 20 mg/dL 15   Creatinine 9.38 - 1.24 mg/dL 8.74   Sodium 864 - 854 mmol/L 137   Potassium 3.5 - 5.1 mmol/L 3.9   Chloride 98 - 111 mmol/L 101   CO2 22 - 32 mmol/L 25   Calcium 8.9 - 10.3 mg/dL 8.9    James Credit, DO 01/11/2024, 11:12 AM  PGY-1, [place] Family [facility] FPTS Intern pager: [phone], text pages welcome Secure chat group [facility] Teaching Service

## 2024-01-11 NOTE — Discharge Instructions (Addendum)
 Dear James Downs,   Thank you so much for allowing us  to be part of your care!  You were admitted to [facility] for decompensated liver cirrhosis. You had extra fluid removed and your medications were adjusted as needed.   POST-HOSPITAL & CARE INSTRUCTIONS Please reach out to your GI doctor (Dr Rollin) to confirm when you'll be getting your routine paracenteses going forward.  Please continue to work with Palliative care regarding your goals of care.  Please let PCP/Specialists know of any changes that were made.  Please see medications section of this packet for any medication changes.    Take care and be well!  Family Medicine Teaching Service  [place]  [facility]  [address] [place], [place] [address] [phone]

## 2024-01-11 NOTE — Assessment & Plan Note (Signed)
 Hx of hepatorenal syndrome. Cr at baseline.  - Continue to monitor kidney function  - Avoid nephrotoxic agents

## 2024-01-11 NOTE — TOC Progression Note (Addendum)
 Transition of Care [facility]) - Progression Note    Patient Details  Name: James Downs MRN: [id number] Date of Birth: [birth year]  Transition of Care [facility]) CM/SW Contact  Bridget Cordella Simmonds, LCSW Phone Number: 01/11/2024, 10:31 AM  Clinical Narrative:   CSW left message with Amber/Admissions at [facility] regarding DC today.  1050: TC message form Amber/[facility]: pt daughter picked up his belongings and said he was not returning.    CSW spoke with pt daughter [name]: they are interested in residential hospice, discussed the timeframes for pts admitting to residential, CSW will check with palliative and the medical team on this.  If pt is not eligible for residential hospice, then they would consider return to [facility] with hospice there.  CSW reached out to palliative and the medical team.   1245: CSW informed pt not candidate for residential hospice.  CSW spoke with [name], who confirmed plan to return to [facility].  [name] will transport pt to SNF.  CSW spoke with Amber/[facility] who confirmed they can receive pt back today, do not need auth prior to pt return.  MD team informed.   1325: DC summary on hub, CSW confirmed with [facility] that she has access.  They will arrange outpt palliative services, she asked for palliative order.  MD team informed.   Expected Discharge Plan: Skilled Nursing Facility Barriers to Discharge: Continued Medical Work up               Expected Discharge Plan and Services In-house Referral: Clinical Social Work   Post Acute Care Choice: Skilled Nursing Facility Living arrangements for the past 2 months: Skilled Nursing Facility                                       Social Drivers of Health (SDOH) Interventions SDOH Screenings   Food Insecurity: No Food Insecurity (01/06/2024)  Recent Concern: Food Insecurity - Food Insecurity Present (01/06/2024)  Housing: Unknown (01/06/2024)   Recent Concern: Housing - High Risk (01/06/2024)  Transportation Needs: No Transportation Needs (01/06/2024)  Utilities: Not At Risk (01/06/2024)  Social Connections: Moderately Integrated (01/06/2024)  Tobacco Use: High Risk (01/06/2024)    Readmission Risk Interventions     No data to display

## 2024-01-11 NOTE — Discharge Summary (Addendum)
 Family Medicine Teaching [facility] Discharge Summary  Patient name: James Downs Medical record number: [id number] Date of birth: Apr 04, 1965 Age: 58 y.o. Gender: male Date of Admission: 01/05/2024  Date of Discharge: 01/11/2024 Admitting Physician: Otto ONEIDA Fairly, MD  Primary Care Provider: Patient, No Pcp Per Consultants: IR, Palliative   Indication for Hospitalization: decompensated cirrhosis with hepatic hydrothorax  Brief Hospital Course:  LENVILLE HIBBERD is a 59 y.o.male with a history of nonalcoholic cirrhosis, esophageal varices, GERD, anxiety/depression, tobacco use who was admitted to the [facility] Medicine Teaching Service at [facility] for decompensated cirrhosis. His hospital course is detailed below:  Decompensated cirrhosis  Hepatic hydrothorax Ascites  Mr. Hyer presented to the [facility] ED with abdominal pain.  Imaging was indicative of fluid overload including right pleural effusion and ascites.  The patient was afebrile with mild leukocytosis, initially elevated lactic acid, and abdominal pain concerning for possible SBP.  Empiric SBP treatment initiated. [name] primary GI team was contacted and did not recommend any GI intervention during admission. Thoracentesis was performed by IM with 1.5 L of yellow, cloudy fluid removed.  Pleural fluid showed no wbc or organisms. Initial diagnostic paracentesis with concern for SBP, so patient completed empiric SBP abx treatment on IV ceftriaxone . Therapeutic paracentesis performed 11/14 with 1.2L fluid removal. Plan for patient to resume prophylactic oral ciprofloxacin on discharge.   Palliative care was involved in patient care during admission. Patient elected for ongoing medical interventions with plan to resume outpatient palliative care. Primary GI team to schedule routine therapeutic paracenteses going forward.   Hepatorenal syndrome  History of hepatorenal syndrome with creatinine of 1.9 on admission which was  consistent with patient baseline. Overall kidney function remained around baseline during admission.   Other chronic conditions were medically managed with home medications and formulary alternatives as necessary (GERD, anxiety/depression, tobacco use).  PCP Follow-up Recommendations: Ensure ongoing palliative team follow up Ensure outpatient routine paracenteses, arranged by primary GI team Continue ciprofloxacin for SBP ppx  Discharge Diagnoses/Problem List:  Hospital Problems      Hospital     * (Principal) Decompensated cirrhosis (HCC)     Hepatorenal syndrome (HCC)     Chronic health problem     Hydrothorax     Ascites     Elevated lactic acid level     Fistula between jejunum and skin     Frailty   Disposition: SNF [facility]  Discharge Condition: Stable  Discharge Exam: General: lying supine comfortably in hospital bed in no acute distress Cardiovascular: distant heart sounds, no murmur, 2+ radial pulses Respiratory: CTAB, normal WOB on RA Abdomen: mild diffuse tenderness, BS present, soft, non-distended Extremities: no peripheral edema, moves all extremities equally  Significant Procedures:  11/12: Diagnostic and therapeutic thoracentesis, diagnostic paracentesis  11/14: Therapeutic paracentesis   Significant Labs and Imaging:   Recent Labs  Lab 01/10/24 0350  NA 137  K 3.9  CL 101  CO2 25  GLUCOSE 88  BUN 15  CREATININE 1.25*  CALCIUM 8.9  ALKPHOS 38  AST 31  ALT 14  ALBUMIN  3.5      Latest Ref Rng & Units 01/07/2024    3:28 AM 01/05/2024   10:45 PM 12/11/2023    5:51 AM  CBC  WBC 4.0 - 10.5 K/uL 10.0  13.0  6.4   Hemoglobin 13.0 - 17.0 g/dL 89.6  88.0  89.3   Hematocrit 39.0 - 52.0 % 30.3  35.1  30.9   Platelets 150 - 400 K/uL  190  245  118    Lab Results  Component Value Date   CREATININE 1.25 (H) 01/10/2024   CREATININE 1.44 (H) 01/09/2024   CREATININE 1.65 (H) 01/08/2024   CT A&P 11/12: Large R pleural effusion. Cirrhotic  appearing liver with mild splenomegaly and mild-to-moderate ascites.   CXR 11/12: Interval development of probable RLL atelectasis with associated pleural effusion.  CXR 11/13: Moderate right pleural effusion. R basilar consolidation and/or atelectasis.    Discharge Medications:  Allergies as of 01/11/2024   No Known Allergies      Medication List     PAUSE taking these medications    aspirin EC 81 MG tablet Wait to take this until your doctor or other care provider tells you to start again. Take 81 mg by mouth daily. Swallow whole.   traMADol 50 MG tablet Wait to take this until your doctor or other care provider tells you to start again. Commonly known as: ULTRAM Take 50 mg by mouth every 6 (six) hours as needed (for pain).       STOP taking these medications    carvedilol  6.25 MG tablet Commonly known as: COREG    furosemide  40 MG tablet Commonly known as: Lasix    spironolactone  100 MG tablet Commonly known as: ALDACTONE    spironolactone  25 MG tablet Commonly known as: ALDACTONE        TAKE these medications    acetaminophen  325 MG tablet Commonly known as: TYLENOL  Take 2 tablets (650 mg total) by mouth every 8 (eight) hours as needed for mild pain (pain score 1-3) or fever (or Fever >/= 101).   barrier cream Crea Commonly known as: non-specified Apply 1 Application topically in the morning, at noon, and at bedtime. Apply to sacrum for sacral redness   ciprofloxacin 500 MG tablet Commonly known as: CIPRO Take 500 mg by mouth every morning.   escitalopram 5 MG tablet Commonly known as: LEXAPRO Take 1 tablet (5 mg total) by mouth daily.   feeding supplement Liqd Take 237 mLs by mouth 3 (three) times daily between meals.   hydrocortisone  cream 1 % Apply 1 Application topically 3 (three) times daily.   lactulose  10 GM/15ML solution Commonly known as: CHRONULAC  Take 45 mLs (30 g total) by mouth 3 (three) times daily. What changed:  how much to  take when to take this   midodrine 10 MG tablet Commonly known as: PROAMATINE Take 3 tablets (30 mg total) by mouth 3 (three) times daily with meals.   multivitamin with minerals Tabs tablet Take 1 tablet by mouth daily.   ondansetron  4 MG disintegrating tablet Commonly known as: ZOFRAN -ODT Take 4 mg by mouth 2 (two) times daily as needed for nausea or vomiting. For 14 days   oxyCODONE  5 MG immediate release tablet Commonly known as: Oxy IR/ROXICODONE  Take 1 tablet (5 mg total) by mouth every 4 (four) hours as needed for moderate pain (pain score 4-6).   pantoprazole 40 MG tablet Commonly known as: PROTONIX Take 1 tablet (40 mg total) by mouth 2 (two) times daily before a meal.   rifaximin 550 MG Tabs tablet Commonly known as: XIFAXAN Take 1 tablet (550 mg total) by mouth 2 (two) times daily.   traZODone  50 MG tablet Commonly known as: DESYREL  Take 25 mg by mouth at bedtime.       Discharge Instructions: Please refer to Patient Instructions section of EMR for full details.  Patient was counseled important signs and symptoms that should prompt return to medical care,  changes in medications, dietary instructions, activity restrictions, and follow up appointments.   Lupie Credit, DO 01/11/2024, 1:14 PM PGY-1, [facility]   I have verified that the service and findings are accurately documented in the resident's note above.  Damien Cassis, MD                  01/11/2024, 1:30 PM

## 2024-01-11 NOTE — Plan of Care (Signed)
  Problem: Education: Goal: Knowledge of General Education information will improve Description: Including pain rating scale, medication(s)/side effects and non-pharmacologic comfort measures Outcome: Adequate for Discharge   Problem: Health Behavior/Discharge Planning: Goal: Ability to manage health-related needs will improve Outcome: Adequate for Discharge   Problem: Clinical Measurements: Goal: Ability to maintain clinical measurements within normal limits will improve Outcome: Adequate for Discharge Goal: Will remain free from infection Outcome: Adequate for Discharge Goal: Diagnostic test results will improve Outcome: Adequate for Discharge Goal: Respiratory complications will improve Outcome: Adequate for Discharge Goal: Cardiovascular complication will be avoided Outcome: Adequate for Discharge   Problem: Activity: Goal: Risk for activity intolerance will decrease Outcome: Adequate for Discharge   Problem: Nutrition: Goal: Adequate nutrition will be maintained Outcome: Adequate for Discharge   Problem: Coping: Goal: Level of anxiety will decrease Outcome: Adequate for Discharge   Problem: Elimination: Goal: Will not experience complications related to bowel motility Outcome: Adequate for Discharge Goal: Will not experience complications related to urinary retention Outcome: Adequate for Discharge   Problem: Pain Managment: Goal: General experience of comfort will improve and/or be controlled Outcome: Adequate for Discharge   Problem: Safety: Goal: Ability to remain free from injury will improve Outcome: Adequate for Discharge   Problem: Skin Integrity: Goal: Risk for impaired skin integrity will decrease Outcome: Adequate for Discharge   Problem: Acute Rehab PT Goals(only PT should resolve) Goal: Patient Will Transfer Sit To/From Stand Outcome: Adequate for Discharge Goal: Pt Will Transfer Bed To Chair/Chair To Bed Outcome: Adequate for Discharge Goal:  Pt Will Ambulate Outcome: Adequate for Discharge Goal: Pt Will Go Up/Down Stairs Outcome: Adequate for Discharge

## 2024-01-11 NOTE — TOC Transition Note (Signed)
 Transition of Care [facility]) - Discharge Note   Patient Details  Name: James Downs MRN: [id number] Date of Birth: Jul 22, 1965  Transition of Care [facility] CM/SW Contact:  Bridget Cordella Simmonds, LCSW Phone Number: 01/11/2024, 1:31 PM   Clinical Narrative:   Pt discharging to [facility].  RN report to [phone].  Pt daughter James Downs will transport.  Will need pt brought down to main north tower entrance.    Final next level of care: Skilled Nursing Facility Barriers to Discharge: Barriers Resolved   Patient Goals and CMS Choice Patient states their goals for this hospitalization and ongoing recovery are:: Return to SNF          Discharge Placement              Patient chooses bed at:  ([facility]) Patient to be transferred to facility by: daughter James Downs Name of family member notified: daughter James Downs in room Patient and family notified of of transfer: 01/11/24  Discharge Plan and Services Additional resources added to the After Visit Summary for   In-house Referral: Clinical Social Work   Post Acute Care Choice: Skilled Nursing Facility                               Social Drivers of Health (SDOH) Interventions SDOH Screenings   Food Insecurity: No Food Insecurity (01/06/2024)  Recent Concern: Food Insecurity - Food Insecurity Present (01/06/2024)  Housing: Unknown (01/06/2024)  Recent Concern: Housing - High Risk (01/06/2024)  Transportation Needs: No Transportation Needs (01/06/2024)  Utilities: Not At Risk (01/06/2024)  Social Connections: Moderately Integrated (01/06/2024)  Tobacco Use: High Risk (01/06/2024)     Readmission Risk Interventions     No data to display

## 2024-01-12 DIAGNOSIS — K767 Hepatorenal syndrome: Secondary | ICD-10-CM | POA: Diagnosis not present

## 2024-01-12 DIAGNOSIS — D696 Thrombocytopenia, unspecified: Secondary | ICD-10-CM | POA: Diagnosis not present

## 2024-01-12 DIAGNOSIS — I8511 Secondary esophageal varices with bleeding: Secondary | ICD-10-CM | POA: Diagnosis not present

## 2024-01-12 DIAGNOSIS — G894 Chronic pain syndrome: Secondary | ICD-10-CM | POA: Diagnosis not present

## 2024-01-13 DIAGNOSIS — G894 Chronic pain syndrome: Secondary | ICD-10-CM | POA: Diagnosis not present

## 2024-01-13 DIAGNOSIS — K7460 Unspecified cirrhosis of liver: Secondary | ICD-10-CM | POA: Diagnosis not present

## 2024-01-13 DIAGNOSIS — I8511 Secondary esophageal varices with bleeding: Secondary | ICD-10-CM | POA: Diagnosis not present

## 2024-01-13 DIAGNOSIS — K767 Hepatorenal syndrome: Secondary | ICD-10-CM | POA: Diagnosis not present

## 2024-01-14 DIAGNOSIS — K7460 Unspecified cirrhosis of liver: Secondary | ICD-10-CM | POA: Diagnosis not present

## 2024-01-14 DIAGNOSIS — K767 Hepatorenal syndrome: Secondary | ICD-10-CM | POA: Diagnosis not present

## 2024-01-14 DIAGNOSIS — R188 Other ascites: Secondary | ICD-10-CM | POA: Diagnosis not present

## 2024-01-14 DIAGNOSIS — D696 Thrombocytopenia, unspecified: Secondary | ICD-10-CM | POA: Diagnosis not present

## 2024-01-14 DIAGNOSIS — K7682 Hepatic encephalopathy: Secondary | ICD-10-CM | POA: Diagnosis not present

## 2024-01-15 DIAGNOSIS — L89152 Pressure ulcer of sacral region, stage 2: Secondary | ICD-10-CM | POA: Diagnosis not present

## 2024-01-15 DIAGNOSIS — E119 Type 2 diabetes mellitus without complications: Secondary | ICD-10-CM | POA: Diagnosis not present

## 2024-01-15 DIAGNOSIS — E440 Moderate protein-calorie malnutrition: Secondary | ICD-10-CM | POA: Diagnosis not present

## 2024-01-15 DIAGNOSIS — K7460 Unspecified cirrhosis of liver: Secondary | ICD-10-CM | POA: Diagnosis not present

## 2024-01-19 DIAGNOSIS — M109 Gout, unspecified: Secondary | ICD-10-CM | POA: Diagnosis not present

## 2024-01-19 DIAGNOSIS — D696 Thrombocytopenia, unspecified: Secondary | ICD-10-CM | POA: Diagnosis not present

## 2024-01-19 DIAGNOSIS — G894 Chronic pain syndrome: Secondary | ICD-10-CM | POA: Diagnosis not present

## 2024-01-19 DIAGNOSIS — G4700 Insomnia, unspecified: Secondary | ICD-10-CM | POA: Diagnosis not present

## 2024-01-19 DIAGNOSIS — K767 Hepatorenal syndrome: Secondary | ICD-10-CM | POA: Diagnosis not present

## 2024-01-28 ENCOUNTER — Encounter (HOSPITAL_COMMUNITY): Payer: Self-pay

## 2024-01-28 ENCOUNTER — Emergency Department (HOSPITAL_COMMUNITY)

## 2024-01-28 ENCOUNTER — Observation Stay (HOSPITAL_COMMUNITY)
Admission: EM | Admit: 2024-01-28 | Discharge: 2024-01-29 | Disposition: A | Discharge status: Skilled Nursing Facility | Attending: Family Medicine | Admitting: Family Medicine

## 2024-01-28 DIAGNOSIS — K7460 Unspecified cirrhosis of liver: Secondary | ICD-10-CM | POA: Diagnosis present

## 2024-01-28 DIAGNOSIS — N202 Calculus of kidney with calculus of ureter: Secondary | ICD-10-CM | POA: Diagnosis not present

## 2024-01-28 DIAGNOSIS — E872 Acidosis, unspecified: Secondary | ICD-10-CM

## 2024-01-28 DIAGNOSIS — R918 Other nonspecific abnormal finding of lung field: Secondary | ICD-10-CM | POA: Diagnosis not present

## 2024-01-28 DIAGNOSIS — J90 Pleural effusion, not elsewhere classified: Secondary | ICD-10-CM | POA: Diagnosis present

## 2024-01-28 DIAGNOSIS — K4090 Unilateral inguinal hernia, without obstruction or gangrene, not specified as recurrent: Secondary | ICD-10-CM | POA: Diagnosis not present

## 2024-01-28 DIAGNOSIS — K8020 Calculus of gallbladder without cholecystitis without obstruction: Secondary | ICD-10-CM | POA: Diagnosis not present

## 2024-01-28 DIAGNOSIS — F17210 Nicotine dependence, cigarettes, uncomplicated: Secondary | ICD-10-CM | POA: Diagnosis not present

## 2024-01-28 DIAGNOSIS — R079 Chest pain, unspecified: Secondary | ICD-10-CM | POA: Diagnosis not present

## 2024-01-28 DIAGNOSIS — N182 Chronic kidney disease, stage 2 (mild): Secondary | ICD-10-CM | POA: Diagnosis not present

## 2024-01-28 DIAGNOSIS — R109 Unspecified abdominal pain: Secondary | ICD-10-CM | POA: Diagnosis not present

## 2024-01-28 DIAGNOSIS — N201 Calculus of ureter: Secondary | ICD-10-CM

## 2024-01-28 DIAGNOSIS — K7210 Chronic hepatic failure without coma: Secondary | ICD-10-CM | POA: Diagnosis not present

## 2024-01-28 DIAGNOSIS — R601 Generalized edema: Secondary | ICD-10-CM

## 2024-01-28 DIAGNOSIS — R188 Other ascites: Principal | ICD-10-CM

## 2024-01-28 DIAGNOSIS — K7469 Other cirrhosis of liver: Secondary | ICD-10-CM | POA: Diagnosis not present

## 2024-01-28 DIAGNOSIS — N200 Calculus of kidney: Secondary | ICD-10-CM

## 2024-01-28 DIAGNOSIS — E119 Type 2 diabetes mellitus without complications: Secondary | ICD-10-CM | POA: Diagnosis not present

## 2024-01-28 DIAGNOSIS — J9811 Atelectasis: Secondary | ICD-10-CM | POA: Diagnosis not present

## 2024-01-28 DIAGNOSIS — K767 Hepatorenal syndrome: Secondary | ICD-10-CM | POA: Diagnosis not present

## 2024-01-28 DIAGNOSIS — Z789 Other specified health status: Secondary | ICD-10-CM

## 2024-01-28 DIAGNOSIS — K760 Fatty (change of) liver, not elsewhere classified: Secondary | ICD-10-CM | POA: Diagnosis not present

## 2024-01-28 DIAGNOSIS — N179 Acute kidney failure, unspecified: Secondary | ICD-10-CM

## 2024-01-28 LAB — CBC WITH DIFFERENTIAL/PLATELET
Abs Immature Granulocytes: 0.04 K/uL (ref 0.00–0.07)
Basophils Absolute: 0 K/uL (ref 0.0–0.1)
Basophils Relative: 0 %
Eosinophils Absolute: 0 K/uL (ref 0.0–0.5)
Eosinophils Relative: 0 %
HCT: 40.1 % (ref 39.0–52.0)
Hemoglobin: 13 g/dL (ref 13.0–17.0)
Immature Granulocytes: 1 %
Lymphocytes Relative: 16 %
Lymphs Abs: 1.3 K/uL (ref 0.7–4.0)
MCH: 30.6 pg (ref 26.0–34.0)
MCHC: 32.4 g/dL (ref 30.0–36.0)
MCV: 94.4 fL (ref 80.0–100.0)
Monocytes Absolute: 1 K/uL (ref 0.1–1.0)
Monocytes Relative: 13 %
Neutro Abs: 5.6 K/uL (ref 1.7–7.7)
Neutrophils Relative %: 70 %
Platelets: 187 K/uL (ref 150–400)
RBC: 4.25 MIL/uL (ref 4.22–5.81)
RDW: 15.7 % — ABNORMAL HIGH (ref 11.5–15.5)
WBC: 8 K/uL (ref 4.0–10.5)
nRBC: 0 % (ref 0.0–0.2)

## 2024-01-28 LAB — URINALYSIS, ROUTINE W REFLEX MICROSCOPIC
Bilirubin Urine: NEGATIVE
Glucose, UA: NEGATIVE mg/dL
Hgb urine dipstick: NEGATIVE
Ketones, ur: NEGATIVE mg/dL
Nitrite: NEGATIVE
Protein, ur: NEGATIVE mg/dL
Specific Gravity, Urine: 1.016 (ref 1.005–1.030)
pH: 5 (ref 5.0–8.0)

## 2024-01-28 LAB — COMPREHENSIVE METABOLIC PANEL WITH GFR
ALT: 41 U/L (ref 0–44)
AST: 88 U/L — ABNORMAL HIGH (ref 15–41)
Albumin: 3.1 g/dL — ABNORMAL LOW (ref 3.5–5.0)
Alkaline Phosphatase: 60 U/L (ref 38–126)
Anion gap: 10 (ref 5–15)
BUN: 23 mg/dL — ABNORMAL HIGH (ref 6–20)
CO2: 23 mmol/L (ref 22–32)
Calcium: 8.7 mg/dL — ABNORMAL LOW (ref 8.9–10.3)
Chloride: 105 mmol/L (ref 98–111)
Creatinine, Ser: 1.28 mg/dL — ABNORMAL HIGH (ref 0.61–1.24)
GFR, Estimated: >60 mL/min (ref >60)
Glucose, Bld: 109 mg/dL — ABNORMAL HIGH (ref 70–99)
Potassium: 3.9 mmol/L (ref 3.5–5.1)
Sodium: 138 mmol/L (ref 135–145)
Total Bilirubin: 1.8 mg/dL — ABNORMAL HIGH (ref 0.0–1.2)
Total Protein: 6.8 g/dL (ref 6.5–8.1)

## 2024-01-28 LAB — PROTIME-INR
INR: 1.4 — ABNORMAL HIGH (ref 0.8–1.2)
Prothrombin Time: 18.2 s — ABNORMAL HIGH (ref 11.4–15.2)

## 2024-01-28 LAB — I-STAT CG4 LACTIC ACID, ED: Lactic Acid, Venous: 2.2 mmol/L (ref 0.5–1.9)

## 2024-01-28 LAB — AMMONIA: Ammonia: 23 umol/L (ref 9–35)

## 2024-01-28 MED ORDER — HYDROMORPHONE HCL 1 MG/ML IJ SOLN
1.0000 mg | Freq: Once | INTRAMUSCULAR | Status: AC
  Administered 2024-01-28: 1 mg via INTRAVENOUS
  Filled 2024-01-28: qty 1

## 2024-01-28 MED ORDER — IOHEXOL 350 MG/ML SOLN
75.0000 mL | Freq: Once | INTRAVENOUS | Status: AC | PRN
  Administered 2024-01-28: 75 mL via INTRAVENOUS

## 2024-01-28 MED ORDER — LACTATED RINGERS IV BOLUS
1000.0000 mL | Freq: Once | INTRAVENOUS | Status: AC
  Administered 2024-01-28: 1000 mL via INTRAVENOUS

## 2024-01-28 MED ORDER — ONDANSETRON HCL 4 MG/2ML IJ SOLN
4.0000 mg | Freq: Once | INTRAMUSCULAR | Status: AC
  Administered 2024-01-28: 4 mg via INTRAVENOUS
  Filled 2024-01-28: qty 2

## 2024-01-28 NOTE — ED Provider Notes (Signed)
 [place] EMERGENCY DEPARTMENT AT [facility] Provider Note   CSN: [id number] Arrival date & time: 01/28/24  1041     Patient presents with: Abdominal Pain   James Downs is a 58 y.o. male. Hx of nonalcoholic cirrhosis, esophageal varices, GERD, anxiety/depression, tobacco use presenting from rehab at [facility] w/ generalized abdominal pain and distension w/ generalized TTP. Denies N/V. Hx per pt. endorses last paracentesis was 2 to 3 weeks ago.  Denies chest pain or shortness of breath.  Reports that he was told by the staff at the rehab to come to the ER to undergo paracentesis.  Patient endorses that he has been having worsening abdominal distention and abdominal pain over the past few days, providers at the facility were concerned about his abdominal distention therefore sent to the ED for paracentesis.  Denies any fever or chills.  States that he has been feeling well, just putting the worse abdominal distention.  Denies swelling to lower extremities.  Endorses that he really wants to be able to go back to his facility this evening.    Abdominal Pain      Prior to Admission medications   Medication Sig Start Date End Date Taking? Authorizing Provider  acetaminophen  (TYLENOL ) 325 MG tablet Take 2 tablets (650 mg total) by mouth every 8 (eight) hours as needed for mild pain (pain score 1-3) or fever (or Fever >/= 101). 12/11/23  Yes Cleotilde Lukes, DO  ciprofloxacin  (CIPRO ) 500 MG tablet Take 500 mg by mouth every morning. 12/30/23 01/29/24 Yes [provider]  escitalopram  (LEXAPRO ) 5 MG tablet Take 1 tablet (5 mg total) by mouth daily. 12/11/23  Yes Cleotilde Lukes, DO  lactulose  (CHRONULAC ) 10 GM/15ML solution Take 45 mLs (30 g total) by mouth 3 (three) times daily. 01/11/24  Yes Diona Perkins, MD  midodrine  (PROAMATINE ) 10 MG tablet Take 3 tablets (30 mg total) by mouth 3 (three) times daily with meals. 12/11/23  Yes Cleotilde Lukes, DO  Multiple  Vitamin (MULTIVITAMIN WITH MINERALS) TABS tablet Take 1 tablet by mouth daily. 12/11/23  Yes Cleotilde Lukes, DO  oxyCODONE  (OXY IR/ROXICODONE ) 5 MG immediate release tablet Take 1 tablet (5 mg total) by mouth every 4 (four) hours as needed for severe pain (pain score 7-10) or breakthrough pain. 01/11/24  Yes Diona Perkins, MD  oxycodone  (OXY-IR) 5 MG capsule Take 5 mg by mouth at bedtime.   Yes [provider]  pantoprazole  (PROTONIX ) 40 MG tablet Take 1 tablet (40 mg total) by mouth 2 (two) times daily before a meal. 12/11/23  Yes Cleotilde Lukes, DO  rifaximin  (XIFAXAN ) 550 MG TABS tablet Take 1 tablet (550 mg total) by mouth 2 (two) times daily. 12/11/23  Yes Cleotilde Lukes, DO  traZODone  (DESYREL ) 50 MG tablet Take 25 mg by mouth at bedtime.   Yes [provider]  ondansetron  (ZOFRAN -ODT) 4 MG disintegrating tablet Take 4 mg by mouth 2 (two) times daily as needed for nausea or vomiting. For 14 days Patient not taking: Reported on 01/28/2024 12/14/23   [provider]    Allergies: Patient has no known allergies.    Review of Systems  Gastrointestinal:  Positive for abdominal pain.    Updated Vital Signs BP 106/69   Pulse 85   Temp 98.1 F (36.7 C) (Oral)   Resp 17   SpO2 95%   Physical Exam Vitals and nursing note reviewed. Exam conducted with a chaperone present.  Constitutional:      General: He is  not in acute distress.    Appearance: He is well-developed.  HENT:     Head: Normocephalic and atraumatic.  Eyes:     Conjunctiva/sclera: Conjunctivae normal.  Cardiovascular:     Rate and Rhythm: Normal rate and regular rhythm.     Heart sounds: Normal heart sounds. No murmur heard. Pulmonary:     Effort: Pulmonary effort is normal. No respiratory distress.     Breath sounds: Normal breath sounds. No stridor. No wheezing or rales.  Abdominal:     General: There is distension.     Palpations: Abdomen is soft. There is fluid wave.     Tenderness:  There is generalized abdominal tenderness. There is no right CVA tenderness or left CVA tenderness. Negative signs include Murphy's sign and McBurney's sign.     Comments: Positive fluid wave, generalized diffuse TTP.  Genitourinary:    Comments: Large right inguinal hernia appreciated, no surrounding erythema or swelling appreciated.  Tender to palpation.  Testicles nontender to palpation, no surrounding erythremia or swelling Musculoskeletal:        General: No swelling.     Cervical back: Neck supple.  Skin:    General: Skin is warm and dry.     Capillary Refill: Capillary refill takes less than 2 seconds.  Neurological:     Mental Status: He is alert.  Psychiatric:        Mood and Affect: Mood normal.     (all labs ordered are listed, but only abnormal results are displayed) Labs Reviewed  CBC WITH DIFFERENTIAL/PLATELET - Abnormal; Notable for the following components:      Result Value   RDW 15.7 (*)    All other components within normal limits  COMPREHENSIVE METABOLIC PANEL WITH GFR - Abnormal; Notable for the following components:   Glucose, Bld 109 (*)    BUN 23 (*)    Creatinine, Ser 1.28 (*)    Calcium 8.7 (*)    Albumin  3.1 (*)    AST 88 (*)    Total Bilirubin 1.8 (*)    All other components within normal limits  PROTIME-INR - Abnormal; Notable for the following components:   Prothrombin Time 18.2 (*)    INR 1.4 (*)    All other components within normal limits  URINALYSIS, ROUTINE W REFLEX MICROSCOPIC - Abnormal; Notable for the following components:   APPearance HAZY (*)    Leukocytes,Ua SMALL (*)    Bacteria, UA RARE (*)    All other components within normal limits  I-STAT CG4 LACTIC ACID, ED - Abnormal; Notable for the following components:   Lactic Acid, Venous 2.2 (*)    All other components within normal limits  AMMONIA  I-STAT CG4 LACTIC ACID, ED  I-STAT CG4 LACTIC ACID, ED    EKG: None  Radiology: CT ABDOMEN PELVIS W CONTRAST Result Date:  01/28/2024 EXAM: CT ABDOMEN AND PELVIS WITH CONTRAST 01/28/2024 05:26:58 PM TECHNIQUE: CT of the abdomen and pelvis was performed with the administration of intravenous contrast. Multiplanar reformatted images are provided for review. Automated exposure control, iterative reconstruction, and/or weight-based adjustment of the mA/kV was utilized to reduce the radiation dose to as low as reasonably achievable. 75 mL of iohexol  (OMNIPAQUE ) 350 MG/ML injection was administered intravenously. COMPARISON: 01/06/2024 CLINICAL HISTORY: abd pain w/ distension FINDINGS: LOWER CHEST: Large right and small left pleural effusion and compressive atelectasis similar to prior . LIVER: Cirrhosis. GALLBLADDER AND BILE DUCTS: Cholelithiasis. No evidence of acute cholecystitis. No biliary ductal dilatation. SPLEEN: Similar splenomegaly.  PANCREAS: No acute abnormality. ADRENAL GLANDS: No acute abnormality. KIDNEYS, URETERS AND BLADDER: Bilateral nonobstructing nephrolithiasis. Specifically, nonobstructing left nephrolithiasis. The previous stones in the distal left ureter have resolved. Multiple stones in the distal right ureter; these have increased from prior, the largest measures 5 mm. No hydronephrosis. Layering stones in the right dependent bladder. Additional nonobstructing right calyceal stones. No hydronephrosis. No perinephric or periureteral stranding. GI AND BOWEL: Stomach demonstrates no acute abnormality. Liquid stool in the colon compatible with diarrheal state. Large right inguinal hernia containing nonobstructed small bowel and fluid. The small bowel within the right inguinal hernia sac demonstrates a thickened wall with mucosal hyperenhancement and engorgement of the vasa recta within the hernia sac. Findings are concerning for developing ischemia / strangulation. Large amount of fluid in the right inguinal hernia. PERITONEUM AND RETROPERITONEUM: Small volume abdominal pelvic ascites. No free air. VASCULATURE: Aorta is  normal in caliber. Aortic atherosclerotic calcification. LYMPH NODES: Shotty retroperitoneal lymphadenopathy is similar. For example, a 1.3 cm left paraaortic node on series 2 image 39. REPRODUCTIVE ORGANS: No acute abnormality. BONES AND SOFT TISSUES: No acute osseous abnormality. No focal soft tissue abnormality. Small left inguinal hernia containing fluid. IMPRESSION: 1. Large right inguinal hernia containing nonobstructed small bowel and fluid. The small bowel within the hernia sac is thickened and injected with a prominence of the vasa recta concerning for developing ischemia / strangulation. Urgent surgical evaluation recommended. 2. Large right and small left pleural effusions with compressive atelectasis. 3. Cirrhosis with small-volume abdominopelvic ascites and similar splenomegaly. 4. Multiple stones in the distal right ureter have increased from prior, largest measuring 5 mm. No hydronephrosis. 5. Nonobstructing left nephrolithiasis. The stones in the distal left ureter are seen previously have resolved. Critical value/emergent results were called by telephone at the time of interpretation on 12 / 4 / 25 at 6:30 pm to Dr. Neysa, who verbally acknowledged these results. Electronically signed by: Norman Gatlin MD 01/28/2024 06:39 PM EST RP Workstation: [id number]   DG Chest Portable 1 View Result Date: 01/28/2024 CLINICAL DATA:  Abdominal and chest pain. EXAM: PORTABLE CHEST 1 VIEW COMPARISON:  01/07/2024, CT 01/06/2024 FINDINGS: Exam demonstrates continued evidence of patient's moderate to large right effusion likely with associated right basilar atelectasis/lobar collapse. Left lung is essentially clear. Mild stable cardiomegaly. Remainder of the exam is unchanged. IMPRESSION: 1. Continued moderate to large right effusion likely with associated right basilar atelectasis/lobar collapse. 2. Stable cardiomegaly. Electronically Signed   By: Toribio Agreste M.D.   On: 01/28/2024 17:00     Procedures    Medications Ordered in the ED  iohexol  (OMNIPAQUE ) 350 MG/ML injection 75 mL (75 mLs Intravenous Contrast Given 01/28/24 1727)  lactated ringers  bolus 1,000 mL (0 mLs Intravenous Stopped 01/28/24 1933)  HYDROmorphone  (DILAUDID ) injection 1 mg (1 mg Intravenous Given 01/28/24 2138)  ondansetron  (ZOFRAN ) injection 4 mg (4 mg Intravenous Given 01/28/24 2138)    Clinical Course as of 01/29/24 0001  Thu Jan 28, 2024  1833 Radiology called with R inguinal hernia; changes concerning for possible strangulation/ischemia.  [TY]  1904 Consult sent to general surgery, given fluids for elevated lactic [BS]  1957 Per general surgery, was able to reduce hernia, endorse that they believe that findings are likely secondary to venous congestion in the setting of known cirrhosis.  They do not believe that the patient has evidence of strangulation or ischemia to the bowel.  Overall, recommend the patient would be stable for discharge at this time. [BS]    Clinical  Course User Index [BS] Arlee Katz, MD [TY] Neysa Caron PARAS, DO                                 Medical Decision Making Amount and/or Complexity of Data Reviewed Radiology: ordered.  Risk Prescription drug management. Decision regarding hospitalization.   Patient patient presentation, history, evaluation, high suspicion for ascites requiring paracentesis, as well as right inguinal hernia, with vascular congestion, inguinal hernia, with vascular congestion, and right pleural effusion.  Right pleural effusion appears slightly worse in comparison to prior, patient with very mild shortness of breath, likely will improve with paracentesis.  Appreciated a fluid pocket that can be appropriately drained, recommend for IR drainage, called IR to see if that they are unable to do it tonight but plan to do it first thing tomorrow morning.  Patient also with vascular congestion of the right inguinal hernia with possible strangulation, therefore called  general surgery who was able to reduce the hernia, vascular congestion is likely in the setting of cirrhosis, recommended continued monitoring following admission, and will continue to consult.  Discussed with family medicine team, agreeable to admission for above findings, IR will perform paracentesis tomorrow morning.  Hemodynamically stable at time of admission.     Final diagnoses:  Other ascites  Pleural effusion on right  Right inguinal hernia  Lactic acidosis    ED Discharge Orders     None          Arlee Katz, MD 01/29/24 0001    Neysa Caron PARAS, DO 02/06/24 [id number]

## 2024-01-28 NOTE — ED Notes (Signed)
 Urine cup given and pt advised that urine sample is needed

## 2024-01-28 NOTE — H&P (Shared)
 Hospital Admission History and Physical Service Pager: [phone]  Patient name: James Downs Medical record number: [id number] Date of Birth: December 31, 1965 Age: 58 y.o. Gender: male  Primary Care Provider: None Consultants: IR Code Status: DNR-limited Preferred Emergency Contact:   Contact Information     Name Relation Home Work Mobile   Laverne Spouse [phone]  [phone]      Other Contacts     Name Relation Home Work Mobile   Propst,Courtney Daughter   [phone]        Chief Complaint: Ascites, abdominal pain  Differential and Medical Decision Making:  James Downs is a 58 y.o. male presenting from rehab facility w/ ascites and abdominal pain.  Differential for this patient's presentation of this includes his regular accumulation of cirrhotic ascites (patient has been getting regular paracenteses since d/c from our service prior, abdominal exam is benign w/o rigidity or rebound), pancreatitis (abdominal pain + ascites, unlikely given benign exam, no clear trigger and preexisting alternative diagnosis, lipase pending), and SBP (ascites w/ abdominal pain, though he is on cipro  prophylaxis, afebrile, WBC WNL, benign on abd exam).  Assessment & Plan Cirrhosis of liver with ascites [facility]) Patient presents from rehab facility w/ abdominal pain and ascites. Aox4 w/ VSS, benign abd exam. Appears consistent w/ his regular cirrhosis related ascites, due for interval paracentesis and unfortunately no physician in ED able to perform overnight, requiring admission for paracentesis with IR in the AM. AST mildly elevated from prior, likely 2/2 congestion, along with lactic acid. Ammonia normal at 23. -Admit to FMTS, attending Dr McDiarmid -IR consult ordered by ED for paracentesis in AM, will need co-administration of albumin  if large volume -Continue home lactulose , rifaximin , and ciprofloxacin   -Administer 100 mg spironolactone  and 40 mg lasix  PO once,  consider continuing at discharge (appears to have been paused at last DC due to BP concerns and hepatorenal syndrome, both of which have improved) -AM CMP Pleural effusion, right Large pleural effusion in R lung on CXR, stable vs prior, likely 2/2 anasarca. Patient w/ normal wob on room air w/o dyspnea.  -Diuresis as above, consider continuing if good response -Consider chest tube placement if worsening Right inguinal hernia Initial concern for strangulation on CT. Gen surgery reduced without issue in ED and does not feel the tissue is compromised. Mild pain on palpation. -Continue to monitor clinically CKD (chronic kidney disease), stage II Persistently elevated Cr after previous admission for hepatorenal syndrome. -Continue to monitor with diuresis as above with CMP Chronic health problem Chronic pain: Tylenol  650mg  q8 hours PRN, oxycodone  5mg  q4 hours PRN + once at bedtime  Hypotension: continue home midodrine  MDD: continue home lexapro   FEN/GI: Carb modified VTE Prophylaxis: Lovenox   Disposition: Medsurg  History of Present Illness:  James Downs is a 58 y.o. male presenting with abdominal pain and ascites.  At the rehab center he has been at, there after multiple admissions for decompensated cirrhosis, advised he come to the ED for abdominal distention due to fluid from cirrhosis by the physician at his facility. He notes that over the last couple of days, he has had some worsening abdominal pain. He does not feel as sick as prior admissions. He is more coherent and not out of his mind. He has been taking all of his medications. No fevers. He has been eating okay. Has not been drinking as much, but he has been drinking cola and orange juice here in the ED. He denies n/v/d/c. Denies dyspnea, cough.  In the ED, he was afebrile w/ VSS. Imaging revealed stable large R pleural effusion, as well as a R inguinal hernia w/ concern for strangulation. Liane was consulted, who reduced  the hernia w/o concern for acute ischemia. Nonobstrcuting kidney stones were also noted b/l. Lab workup notable for stable kidney function w/ Cr 1.28 vs 1.25 prior, AST 88 ALT 41, Tbili 1.8, lactate 2.2. CBC WNL. PT INR 18.2/1.4. UA WNL.   Review Of Systems: Per HPI.  Pertinent Past Medical History: NAFLD w/ cirrhosis, hx of SBP and hepatic encephalopathy T2DM Depression Lumbar radiculopathy HLD Remainder reviewed in history tab.   Pertinent Past Surgical History: IR Paracentesis 9/29, 10/1, 12/04/2023 Lumbar fusion 2015  Remainder reviewed in history tab.   Pertinent Social History: Tobacco use: Current, about 0.5 ppd Alcohol use: None Other Substance use: None Lives at rehab facility currently   Pertinent Family History: Father: brain cancer, HTN   Important Outpatient Medications: Tylenol  325mg  q8 hours PRN Oxycodone  5mg  q4 hours PRN, once at bedtime Ciprofloxacin  500mg  daily Rifaximin  550mg  BID Midodrine  30mg  TID Lexapro  5mg  daily Trazodone  25mg  at bedtime Lactulose  30g TID Zofran  odt 4mg  PRN Protonix  40mg  BID  Objective: BP 122/80 (BP Location: Right Arm)   Pulse 82   Temp 98.1 F (36.7 C)   Resp 16   SpO2 97%  Exam: General: Awake, alert, NAD. Communicates clearly. Cardio: RRR. Normal S1, S2. No murmur, rub, gallop. 2+ radial and dorsalis pedis pulses b/l w/ good capillary refill. No LE edema.  Resp: CTA bilaterally anteriorly. No wheezes, rales, or rhonchi. Normal work of breathing on room air Abdomen: soft, distended abdomen that is mildly tender to deep palpation throughout . Hyperactive BS auscultated. No guarding, rigidity, or rebound. Positive fluid wave.  Extremities: Full ROM w/ no TTP or obvious deformity. No swelling or sign of injury.  Neuro: AOx4. Moves all extremities.  Psych: Appropriate mood and affect.   Labs:  CBC BMET  Recent Labs  Lab 01/28/24 1249  WBC 8.0  HGB 13.0  HCT 40.1  PLT 187   Recent Labs  Lab 01/28/24 1249  NA 138   K 3.9  CL 105  CO2 23  BUN 23*  CREATININE 1.28*  GLUCOSE 109*  CALCIUM 8.7*    Ammonia 23 PT INR - 18.2, 1.4 UA small leuks, rare bacteria Lactate 2.2  Imaging Studies Performed:  CXR: Impression from Radiologist: 1. Continued moderate to large right effusion likely with associated right basilar atelectasis/lobar collapse. 2. Stable cardiomegaly.   My Interpretation: Cardiomegaly, large R pleural effusion, no consolidation or bony abnormalities   CTAP:  1. Large right inguinal hernia containing nonobstructed small bowel and fluid. The small bowel within the hernia sac is thickened and injected with a prominence of the vasa recta concerning for developing ischemia / strangulation. Urgent surgical evaluation recommended. 2. Large right and small left pleural effusions with compressive atelectasis. 3. Cirrhosis with small-volume abdominopelvic ascites and similar splenomegaly. 4. Multiple stones in the distal right ureter have increased from prior, largest measuring 5 mm. No hydronephrosis. 5. Nonobstructing left nephrolithiasis. The stones in the distal left ureter are seen previously have resolved.  Manon Jester, DO 01/29/2024, 12:16 AM PGY-1, [facility]  FPTS Intern pager: [phone], text pages welcome Secure chat group [facility] Teaching Service   I agree with the assessment and plan as documented above.  Stuart Redo, MD PGY-3, [facility]

## 2024-01-28 NOTE — ED Provider Triage Note (Signed)
 Emergency Medicine Provider Triage Evaluation Note  James Downs , a 58 y.o. male  was evaluated in triage.  Pt complains of abdominal distention. The patient reports that he was sent here from the physician at rehab for a paracentesis. The patient reports that he has chronic abdominal pain that is unchanged. He reports some distention. Denies any nausea, vomiting, fever, or bowel changes. Last para was around 2-3 weeks ago per patient. Denies any chest pain or SOB.  Review of Systems  Positive:  Negative:   Physical Exam  BP 134/89   Pulse 90   Temp 98.3 F (36.8 C)   Resp 16   SpO2 100%  Gen:   Awake, chronically ill appearing Resp:  Normal effort  MSK:   Moves extremities without difficulty  Other:  Abdomen distended with diffuse tenderness.   Medical Decision Making  Medically screening exam initiated at 12:41 PM.  Appropriate orders placed.  Emeric CHRISTELLA Gander was informed that the remainder of the evaluation will be completed by another provider, this initial triage assessment does not replace that evaluation, and the importance of remaining in the ED until their evaluation is complete.  Lab work ordered. Vital signs are stable.    Bernis Ernst, PA-C 01/28/24 1243

## 2024-01-28 NOTE — Consult Note (Signed)
 Reason for Consult:RIH with ?strangulated bowel Referring Physician: [name] is an 58 y.o. male.  HPI: 57yo M with HX nonalcoholic decompensated cirrhosis, hepatorenal syndrome, and esophageal varices was sent to the emergency department from the facility he resides at for consideration of paracentesis.  He develops abdominal pain and has to frequently have paracentesis done.  He was just discharged from the hospital on 01/11/2024.  He underwent workup in the emergency department including CT scan of the abdomen and pelvis which showed a right inguinal hernia containing bowel with concern for possible bowel strangulation.  There was no signs of obstruction.  I asked the patient about this hernia and he was unaware that he had it.  He has not been having much groin pain, mostly the abdominal pain he associates with his ascites.  He has not had a hernia operation before.  Past Medical History:  Diagnosis Date   Arthritis    Depression    Gout    Hypercholesterolemia    Hypertension    on meds   Kidney damage    Kidney stone    Obesity    Sleep apnea    uses CPAP    Past Surgical History:  Procedure Laterality Date   FOOT SURGERY Right 1991   HAND SURGERY Right 1985   IR PARACENTESIS  11/23/2023   IR PARACENTESIS  11/25/2023   IR PARACENTESIS  12/04/2023   IR PARACENTESIS  01/06/2024   IR PARACENTESIS  01/08/2024   IR THORACENTESIS ASP PLEURAL SPACE W/IMG GUIDE  01/06/2024   POSTERIOR LUMBAR FUSION 4 LEVEL N/A 11/29/2013   Procedure: Thoracic Eight, Thoracic Nine, Costotransversectomy   Thoracic Seven-Eight Thoracic Eight-Nine diskectomy;  Surgeon: Gerldine Maizes, MD;  Location: [facility];  Service: Neurosurgery;  Laterality: N/A;  Thoracic Eight, Thoracic Nine, Costotransversectomy   Thoracic Seven-Eight Thoracic Eight-Nine diskectomy    Family History  Problem Relation Age of Onset   Cancer Father        BRAIN/PROSTATE/SKIN   Hypertension Father     Cancer Paternal Grandfather    Heart failure Maternal Grandmother    Diabetes Maternal Grandmother    Hypertension Maternal Grandmother    Hypertension Maternal Grandfather     Social History:  reports that he has been smoking cigarettes. He started smoking about 43 years ago. He has a 43.9 pack-year smoking history. He has quit using smokeless tobacco.  His smokeless tobacco use included chew and snuff. He reports that he does not currently use alcohol. He reports that he does not use drugs.  Allergies: No Known Allergies  Medications: I have reviewed the patient's current medications.  Results for orders placed or performed during the hospital encounter of 01/28/24 (from the past 48 hours)  Ammonia     Status: None   Collection Time: 01/28/24 12:49 PM  Result Value Ref Range   Ammonia 23 9 - 35 umol/L    Comment: Performed at [facility], [address], [place], [place] [address]  CBC with Differential     Status: Abnormal   Collection Time: 01/28/24 12:49 PM  Result Value Ref Range   WBC 8.0 4.0 - 10.5 K/uL   RBC 4.25 4.22 - 5.81 MIL/uL   Hemoglobin 13.0 13.0 - 17.0 g/dL   HCT 59.8 60.9 - 47.9 %   MCV 94.4 80.0 - 100.0 fL   MCH 30.6 26.0 - 34.0 pg   MCHC 32.4 30.0 - 36.0 g/dL   RDW 84.2 (H)  11.5 - 15.5 %   Platelets 187 150 - 400 K/uL   nRBC 0.0 0.0 - 0.2 %   Neutrophils Relative % 70 %   Neutro Abs 5.6 1.7 - 7.7 K/uL   Lymphocytes Relative 16 %   Lymphs Abs 1.3 0.7 - 4.0 K/uL   Monocytes Relative 13 %   Monocytes Absolute 1.0 0.1 - 1.0 K/uL   Eosinophils Relative 0 %   Eosinophils Absolute 0.0 0.0 - 0.5 K/uL   Basophils Relative 0 %   Basophils Absolute 0.0 0.0 - 0.1 K/uL   Immature Granulocytes 1 %   Abs Immature Granulocytes 0.04 0.00 - 0.07 K/uL    Comment: Performed at [facility], [address], [place], [place] [address]  Comprehensive metabolic panel     Status: Abnormal   Collection Time: 01/28/24 12:49 PM  Result Value Ref Range   Sodium  138 135 - 145 mmol/L   Potassium 3.9 3.5 - 5.1 mmol/L   Chloride 105 98 - 111 mmol/L   CO2 23 22 - 32 mmol/L   Glucose, Bld 109 (H) 70 - 99 mg/dL    Comment: Glucose reference range applies only to samples taken after fasting for at least 8 hours.   BUN 23 (H) 6 - 20 mg/dL   Creatinine, Ser 8.71 (H) 0.61 - 1.24 mg/dL   Calcium 8.7 (L) 8.9 - 10.3 mg/dL   Total Protein 6.8 6.5 - 8.1 g/dL   Albumin  3.1 (L) 3.5 - 5.0 g/dL   AST 88 (H) 15 - 41 U/L   ALT 41 0 - 44 U/L   Alkaline Phosphatase 60 38 - 126 U/L   Total Bilirubin 1.8 (H) 0.0 - 1.2 mg/dL   GFR, Estimated >39 >39 mL/min    Comment: (NOTE) Calculated using the CKD-EPI Creatinine Equation (2021)    Anion gap 10 5 - 15    Comment: Performed at [facility], [address] [place], [place] [address]  Protime-INR     Status: Abnormal   Collection Time: 01/28/24 12:49 PM  Result Value Ref Range   Prothrombin Time 18.2 (H) 11.4 - 15.2 seconds   INR 1.4 (H) 0.8 - 1.2    Comment: (NOTE) INR goal varies based on device and disease states. Performed at [facility], [address] [place], [place] [address]   Urinalysis, Routine w reflex microscopic -Urine, Clean Catch     Status: Abnormal   Collection Time: 01/28/24  4:06 PM  Result Value Ref Range   Color, Urine YELLOW YELLOW   APPearance HAZY (A) CLEAR   Specific Gravity, Urine 1.016 1.005 - 1.030   pH 5.0 5.0 - 8.0   Glucose, UA NEGATIVE NEGATIVE mg/dL   Hgb urine dipstick NEGATIVE NEGATIVE   Bilirubin Urine NEGATIVE NEGATIVE   Ketones, ur NEGATIVE NEGATIVE mg/dL   Protein, ur NEGATIVE NEGATIVE mg/dL   Nitrite NEGATIVE NEGATIVE   Leukocytes,Ua SMALL (A) NEGATIVE   RBC / HPF 11-20 0 - 5 RBC/hpf   WBC, UA 21-50 0 - 5 WBC/hpf   Bacteria, UA RARE (A) NONE SEEN   Squamous Epithelial / HPF 0-5 0 - 5 /HPF   Mucus PRESENT    Budding Yeast PRESENT     Comment: Performed at [facility], [address], [place], [place] [address]  I-Stat CG4 Lactic Acid      Status: Abnormal   Collection Time: 01/28/24  7:00 PM  Result Value Ref Range  Lactic Acid, Venous 2.2 (HH) 0.5 - 1.9 mmol/L   Comment NOTIFIED PHYSICIAN     CT ABDOMEN PELVIS W CONTRAST Result Date: 01/28/2024 EXAM: CT ABDOMEN AND PELVIS WITH CONTRAST 01/28/2024 05:26:58 PM TECHNIQUE: CT of the abdomen and pelvis was performed with the administration of intravenous contrast. Multiplanar reformatted images are provided for review. Automated exposure control, iterative reconstruction, and/or weight-based adjustment of the mA/kV was utilized to reduce the radiation dose to as low as reasonably achievable. 75 mL of iohexol  (OMNIPAQUE ) 350 MG/ML injection was administered intravenously. COMPARISON: 01/06/2024 CLINICAL HISTORY: abd pain w/ distension FINDINGS: LOWER CHEST: Large right and small left pleural effusion and compressive atelectasis similar to prior . LIVER: Cirrhosis. GALLBLADDER AND BILE DUCTS: Cholelithiasis. No evidence of acute cholecystitis. No biliary ductal dilatation. SPLEEN: Similar splenomegaly. PANCREAS: No acute abnormality. ADRENAL GLANDS: No acute abnormality. KIDNEYS, URETERS AND BLADDER: Bilateral nonobstructing nephrolithiasis. Specifically, nonobstructing left nephrolithiasis. The previous stones in the distal left ureter have resolved. Multiple stones in the distal right ureter; these have increased from prior, the largest measures 5 mm. No hydronephrosis. Layering stones in the right dependent bladder. Additional nonobstructing right calyceal stones. No hydronephrosis. No perinephric or periureteral stranding. GI AND BOWEL: Stomach demonstrates no acute abnormality. Liquid stool in the colon compatible with diarrheal state. Large right inguinal hernia containing nonobstructed small bowel and fluid. The small bowel within the right inguinal hernia sac demonstrates a thickened wall with mucosal hyperenhancement and engorgement of the vasa recta within the hernia sac. Findings are  concerning for developing ischemia / strangulation. Large amount of fluid in the right inguinal hernia. PERITONEUM AND RETROPERITONEUM: Small volume abdominal pelvic ascites. No free air. VASCULATURE: Aorta is normal in caliber. Aortic atherosclerotic calcification. LYMPH NODES: Shotty retroperitoneal lymphadenopathy is similar. For example, a 1.3 cm left paraaortic node on series 2 image 39. REPRODUCTIVE ORGANS: No acute abnormality. BONES AND SOFT TISSUES: No acute osseous abnormality. No focal soft tissue abnormality. Small left inguinal hernia containing fluid. IMPRESSION: 1. Large right inguinal hernia containing nonobstructed small bowel and fluid. The small bowel within the hernia sac is thickened and injected with a prominence of the vasa recta concerning for developing ischemia / strangulation. Urgent surgical evaluation recommended. 2. Large right and small left pleural effusions with compressive atelectasis. 3. Cirrhosis with small-volume abdominopelvic ascites and similar splenomegaly. 4. Multiple stones in the distal right ureter have increased from prior, largest measuring 5 mm. No hydronephrosis. 5. Nonobstructing left nephrolithiasis. The stones in the distal left ureter are seen previously have resolved. Critical value/emergent results were called by telephone at the time of interpretation on 12 / 4 / 25 at 6:30 pm to Dr. Neysa, who verbally acknowledged these results. Electronically signed by: Norman Gatlin MD 01/28/2024 06:39 PM EST RP Workstation: [id number]   DG Chest Portable 1 View Result Date: 01/28/2024 CLINICAL DATA:  Abdominal and chest pain. EXAM: PORTABLE CHEST 1 VIEW COMPARISON:  01/07/2024, CT 01/06/2024 FINDINGS: Exam demonstrates continued evidence of patient's moderate to large right effusion likely with associated right basilar atelectasis/lobar collapse. Left lung is essentially clear. Mild stable cardiomegaly. Remainder of the exam is unchanged. IMPRESSION: 1. Continued  moderate to large right effusion likely with associated right basilar atelectasis/lobar collapse. 2. Stable cardiomegaly. Electronically Signed   By: Toribio Agreste M.D.   On: 01/28/2024 17:00    Review of Systems  Constitutional:  Negative for appetite change.  HENT: Negative.    Eyes: Negative.   Respiratory: Negative.    Cardiovascular: Negative.  Gastrointestinal:  Positive for abdominal distention and abdominal pain. Negative for nausea and vomiting.  Endocrine: Negative.   Genitourinary: Negative.   Musculoskeletal: Negative.   Skin: Negative.   Allergic/Immunologic: Negative.   Neurological: Negative.   Hematological: Negative.   Psychiatric/Behavioral: Negative.     Blood pressure (!) 175/87, pulse 61, temperature 98.5 F (36.9 C), resp. rate 17, SpO2 100%. Physical Exam Cardiovascular:     Rate and Rhythm: Normal rate and regular rhythm.  Pulmonary:     Effort: Pulmonary effort is normal.     Breath sounds: Normal breath sounds. No wheezing.  Abdominal:     Palpations: Abdomen is soft. There is fluid wave.     Tenderness: There is no guarding or rebound.     Hernia: A hernia is present. Hernia is present in the right inguinal area.     Comments: RIH easily reduces and defect is pretty wide  Neurological:     Mental Status: He is alert.  Psychiatric:        Mood and Affect: Mood normal.     Assessment/Plan: Decompensated nonalcoholic cirrhosis Hepatorenal syndrome History of esophageal varices  Right inguinal hernia easily reduced on exam.  It has a wide opening so I do not feel it will compromise his bowel.  Some of the CT findings may be due to intestinal venous congestion due to portal hypertension.  Okay for clear liquids/PO trial. OK for D/C if tolerates from my standpoint. His cirrhosis imparts significant perioperative mortality risk. No surgery recommended at this time.  James Downs 01/28/2024, 7:48 PM

## 2024-01-28 NOTE — ED Triage Notes (Addendum)
 Pt reports he is at [facility] for rehab, doctor came to see him and told him he needed to come to ER to get paracentesis. C/o generalized abdominal pain. Abdomen is distended and tender to palpitation. Denies N/V

## 2024-01-29 ENCOUNTER — Encounter (HOSPITAL_COMMUNITY): Payer: Self-pay

## 2024-01-29 ENCOUNTER — Observation Stay (HOSPITAL_COMMUNITY)

## 2024-01-29 ENCOUNTER — Other Ambulatory Visit: Payer: Self-pay

## 2024-01-29 DIAGNOSIS — N200 Calculus of kidney: Secondary | ICD-10-CM

## 2024-01-29 DIAGNOSIS — R188 Other ascites: Secondary | ICD-10-CM

## 2024-01-29 DIAGNOSIS — J90 Pleural effusion, not elsewhere classified: Secondary | ICD-10-CM | POA: Diagnosis not present

## 2024-01-29 DIAGNOSIS — N182 Chronic kidney disease, stage 2 (mild): Secondary | ICD-10-CM | POA: Insufficient documentation

## 2024-01-29 DIAGNOSIS — N201 Calculus of ureter: Secondary | ICD-10-CM | POA: Diagnosis not present

## 2024-01-29 DIAGNOSIS — K4090 Unilateral inguinal hernia, without obstruction or gangrene, not specified as recurrent: Secondary | ICD-10-CM

## 2024-01-29 DIAGNOSIS — K7460 Unspecified cirrhosis of liver: Secondary | ICD-10-CM | POA: Diagnosis not present

## 2024-01-29 LAB — COMPREHENSIVE METABOLIC PANEL WITH GFR
ALT: 33 U/L (ref 0–44)
AST: 66 U/L — ABNORMAL HIGH (ref 15–41)
Albumin: 2.5 g/dL — ABNORMAL LOW (ref 3.5–5.0)
Alkaline Phosphatase: 50 U/L (ref 38–126)
Anion gap: 9 (ref 5–15)
BUN: 19 mg/dL (ref 6–20)
CO2: 27 mmol/L (ref 22–32)
Calcium: 8.4 mg/dL — ABNORMAL LOW (ref 8.9–10.3)
Chloride: 101 mmol/L (ref 98–111)
Creatinine, Ser: 1.22 mg/dL (ref 0.61–1.24)
GFR, Estimated: >60 mL/min (ref >60)
Glucose, Bld: 95 mg/dL (ref 70–99)
Potassium: 3.5 mmol/L (ref 3.5–5.1)
Sodium: 137 mmol/L (ref 135–145)
Total Bilirubin: 1.5 mg/dL — ABNORMAL HIGH (ref 0.0–1.2)
Total Protein: 6 g/dL — ABNORMAL LOW (ref 6.5–8.1)

## 2024-01-29 LAB — PROTEIN, PLEURAL OR PERITONEAL FLUID: Total protein, fluid: <3 g/dL

## 2024-01-29 LAB — LACTATE DEHYDROGENASE, PLEURAL OR PERITONEAL FLUID: LD, Fluid: 44 U/L — ABNORMAL HIGH (ref 3–23)

## 2024-01-29 LAB — GLUCOSE, PLEURAL OR PERITONEAL FLUID: Glucose, Fluid: 100 mg/dL

## 2024-01-29 LAB — BODY FLUID CELL COUNT WITH DIFFERENTIAL
Eos, Fluid: 0 %
Lymphs, Fluid: 81 %
Monocyte-Macrophage-Serous Fluid: 19 % — ABNORMAL LOW (ref 50–90)
Neutrophil Count, Fluid: 0 % (ref 0–25)
Total Nucleated Cell Count, Fluid: 118 uL (ref 0–1000)

## 2024-01-29 LAB — LIPASE, BLOOD: Lipase: 42 U/L (ref 11–51)

## 2024-01-29 MED ORDER — ONDANSETRON 4 MG PO TBDP: 4.0000 mg | ORAL_TABLET | Freq: Two times a day (BID) | ORAL | Status: DC | PRN

## 2024-01-29 MED ORDER — TAMSULOSIN HCL 0.4 MG PO CAPS: 0.4000 mg | ORAL_CAPSULE | Freq: Every day | ORAL | Status: AC

## 2024-01-29 MED ORDER — LIDOCAINE-EPINEPHRINE 1 %-1:100000 IJ SOLN
20.0000 mL | Freq: Once | INTRAMUSCULAR | Status: AC
  Administered 2024-01-29: 10 mL via INTRADERMAL

## 2024-01-29 MED ORDER — ENOXAPARIN SODIUM 40 MG/0.4ML IJ SOSY: 40.0000 mg | PREFILLED_SYRINGE | INTRAMUSCULAR | Status: DC

## 2024-01-29 MED ORDER — OXYCODONE HCL 5 MG PO TABS
5.0000 mg | ORAL_TABLET | ORAL | Status: DC | PRN
  Administered 2024-01-29 (×2): 5 mg via ORAL
  Filled 2024-01-29: qty 1

## 2024-01-29 MED ORDER — FUROSEMIDE 40 MG PO TABS
40.0000 mg | ORAL_TABLET | Freq: Once | ORAL | Status: AC
  Administered 2024-01-29: 40 mg via ORAL
  Filled 2024-01-29: qty 1

## 2024-01-29 MED ORDER — SPIRONOLACTONE 25 MG PO TABS
100.0000 mg | ORAL_TABLET | Freq: Once | ORAL | Status: AC
  Administered 2024-01-29: 100 mg via ORAL
  Filled 2024-01-29: qty 4

## 2024-01-29 MED ORDER — TAMSULOSIN HCL 0.4 MG PO CAPS
0.4000 mg | ORAL_CAPSULE | Freq: Every day | ORAL | Status: DC
  Administered 2024-01-29: 0.4 mg via ORAL
  Filled 2024-01-29: qty 1

## 2024-01-29 MED ORDER — ESCITALOPRAM OXALATE 10 MG PO TABS
5.0000 mg | ORAL_TABLET | Freq: Every day | ORAL | Status: DC
  Administered 2024-01-29: 5 mg via ORAL
  Filled 2024-01-29: qty 1

## 2024-01-29 MED ORDER — TRAZODONE HCL 50 MG PO TABS
25.0000 mg | ORAL_TABLET | Freq: Every day | ORAL | Status: DC
  Administered 2024-01-29: 25 mg via ORAL
  Filled 2024-01-29: qty 1

## 2024-01-29 MED ORDER — FUROSEMIDE 40 MG PO TABS: 40.0000 mg | ORAL_TABLET | Freq: Every day | ORAL | Status: AC

## 2024-01-29 MED ORDER — ACETAMINOPHEN 325 MG PO TABS: 650.0000 mg | ORAL_TABLET | Freq: Three times a day (TID) | ORAL | Status: DC | PRN

## 2024-01-29 MED ORDER — LIDOCAINE-EPINEPHRINE 1 %-1:100000 IJ SOLN
INTRAMUSCULAR | Status: AC
  Filled 2024-01-29: qty 1

## 2024-01-29 MED ORDER — CIPROFLOXACIN HCL 500 MG PO TABS
500.0000 mg | ORAL_TABLET | Freq: Every morning | ORAL | Status: DC
  Administered 2024-01-29: 500 mg via ORAL
  Filled 2024-01-29: qty 1

## 2024-01-29 MED ORDER — SPIRONOLACTONE 100 MG PO TABS: 100.0000 mg | ORAL_TABLET | Freq: Every day | ORAL | Status: AC

## 2024-01-29 MED ORDER — MIDODRINE HCL 5 MG PO TABS
30.0000 mg | ORAL_TABLET | Freq: Three times a day (TID) | ORAL | Status: DC
  Administered 2024-01-29 (×2): 30 mg via ORAL
  Filled 2024-01-29 (×2): qty 6

## 2024-01-29 MED ORDER — PANTOPRAZOLE SODIUM 40 MG PO TBEC
40.0000 mg | DELAYED_RELEASE_TABLET | Freq: Two times a day (BID) | ORAL | Status: DC
  Administered 2024-01-29 (×2): 40 mg via ORAL
  Filled 2024-01-29 (×2): qty 1

## 2024-01-29 MED ORDER — RIFAXIMIN 550 MG PO TABS
550.0000 mg | ORAL_TABLET | Freq: Two times a day (BID) | ORAL | Status: DC
  Administered 2024-01-29: 550 mg via ORAL
  Filled 2024-01-29 (×2): qty 1

## 2024-01-29 MED ORDER — LACTULOSE 10 GM/15ML PO SOLN
30.0000 g | Freq: Three times a day (TID) | ORAL | Status: DC
  Administered 2024-01-29 (×2): 30 g via ORAL
  Filled 2024-01-29 (×2): qty 45

## 2024-01-29 NOTE — Plan of Care (Signed)

## 2024-01-29 NOTE — Discharge Summary (Addendum)
 Family Medicine Teaching [facility] Discharge Summary  Patient name: James Downs Medical record number: [id number] Date of birth: October 08, 1965 Age: 58 y.o. Gender: male Date of Admission: 01/28/2024  Date of Discharge: 01/29/2024 Admitting Physician: Leafy Scriver, DO  Primary Care Provider: Patient, No Pcp Per Consultants: IR  Indication for Hospitalization: Acute decompensated liver cirrhosis  Discharge Diagnoses/Problem List:  Principal Problem for Admission:  Other Problems addressed during stay:  Principal Problem:   Cirrhosis of liver with ascites (HCC) Active Problems:   Pleural effusion, right   Right inguinal hernia   CKD (chronic kidney disease), stage II   Ureteral stone   Kidney stones   Brief Hospital Course:  James Downs is a 58 y.o.male with a history of NAFLD cirrhosis, hx of SBP and hepatic encephalopathy, T2DM, depression, HLD who was admitted to the family medicine teaching Service at [facility] for abdominal pain and nephrolithiasis. His hospital course is detailed below:  Abdominal Pain  Nephrolithiasis  NAFLD w/ Cirrhosis  R Pleural Eff Pt presented from SNF w several days of abdominal pain. VSS and afebrile, no AMS. Imaging revealed large R pleural eff and BL nonobstructing ureteral kidney stones. IR consulted, performed thoracentesis and fluid studies c/w transudative effusion, likely in setting of cirrhosis.  Ascites fluid was too minimal to safely tap. Pt was started on flomax  for nephrolithiasis. Pt remained stable after pleuracentesis. Pt discharged back to SNF.   R Inguinal Hernia Incindentally noted on imaging, Surgery evaled and able to reduce without surgery.  Pain resolved after hernia reduction.  Other chronic conditions were medically managed with home medications and formulary alternatives as necessary (chronic pain, hypotension, MDD)  PCP Follow-up Recommendations: Started on flomax  for 5mm R uretal stone. Monitor for passing  of stone and resolution of symptoms.  Can DC Flomax  after 2 stones have passed F/u Urine culture, consider treating for UTI if positive and still symptomatic.  Started spironolactone /Lasix  on discharge, monitor blood pressures and kidney function.     Results/Tests Pending at Time of Discharge:  Unresulted Labs (From admission, onward)     Start     Ordered   01/30/24 0500  Comprehensive metabolic panel  Tomorrow morning,   R        01/29/24 1146   01/30/24 0500  CBC  Tomorrow morning,   R        01/29/24 1146   01/29/24 1045  Urine Culture (for pregnant, neutropenic or urologic patients or patients with an indwelling urinary catheter)  (Urine Labs)  Add-on,   AD       Question:  Indication  Answer:  Flank Pain   01/29/24 1044   01/29/24 1006  Body fluid cell count with differential  RELEASE UPON ORDERING,   TIMED        01/29/24 1006   01/29/24 1006  Cholesterol, body fluid  RELEASE UPON ORDERING,   TIMED        01/29/24 1006   Pending  Lipase, blood  Once,   R        Pending   Pending  Comprehensive metabolic panel  Once,   R        Pending   Pending  CBC  Once,   R        Pending   Pending  Urinalysis, Routine w reflex microscopic -Urine, Clean Catch  Once,   R       Question Answer Comment  Obtain urine by in and out catheter if not  obtained within 30 minutes of placing in a treatment room? Yes   Specimen Source Urine, Clean Catch      Pending             Disposition: SNF  Discharge Condition: Stable  Discharge Exam:  Vitals:   01/29/24 0926 01/29/24 0943  BP: 117/84 116/68  Pulse:    Resp:    Temp:    SpO2:     General: Resting comfortably in no acute distress  cardiovascular: Regular rate rhythm, normal S1 and S2, no murmurs rubs or gallops Respiratory: Clear to auscultation bilaterally, no wheezes or crackles Abdomen: Active bowel sounds, nondistended, nontender Extremities: Nonedematous, nontender  Significant Procedures: Thoracentesis with  IR  Significant Labs and Imaging:  Recent Labs  Lab 01/28/24 1249  WBC 8.0  HGB 13.0  HCT 40.1  PLT 187   Recent Labs  Lab 01/28/24 1249 01/29/24 0509  NA 138 137  K 3.9 3.5  CL 105 101  CO2 23 27  GLUCOSE 109* 95  BUN 23* 19  CREATININE 1.28* 1.22  CALCIUM 8.7* 8.4*  ALKPHOS 60 50  AST 88* 66*  ALT 41 33  ALBUMIN  3.1* 2.5*    Chest x-ray IMPRESSION: 1. Continued moderate to large right effusion likely with associated right basilar atelectasis/lobar collapse. 2. Stable cardiomegaly.  CT abdomen pelvis with contrast IMPRESSION: 1. Large right inguinal hernia containing nonobstructed small bowel and fluid. The small bowel within the hernia sac is thickened and injected with a prominence of the vasa recta concerning for developing ischemia / strangulation. Urgent surgical evaluation recommended. 2. Large right and small left pleural effusions with compressive atelectasis. 3. Cirrhosis with small-volume abdominopelvic ascites and similar splenomegaly. 4. Multiple stones in the distal right ureter have increased from prior, largest measuring 5 mm. No hydronephrosis. 5. Nonobstructing left nephrolithiasis. The stones in the distal left ureter are seen previously have resolved. Critical value/emergent results were called by telephone at the time of interpretation on 12 / 4 / 25 at 6:30 pm to Dr. Neysa, who verbally acknowledged these results.  IR thoracentesis IMPRESSION: Successful ultrasound guided right thoracentesis yielding 2.0 liters of pleural fluid.   Discharge Medications:  Allergies as of 01/29/2024   No Known Allergies      Medication List     TAKE these medications    acetaminophen  325 MG tablet Commonly known as: TYLENOL  Take 2 tablets (650 mg total) by mouth every 8 (eight) hours as needed for mild pain (pain score 1-3) or fever (or Fever >/= 101).   ciprofloxacin  500 MG tablet Commonly known as: CIPRO  Take 500 mg by mouth every  morning.   escitalopram  5 MG tablet Commonly known as: LEXAPRO  Take 1 tablet (5 mg total) by mouth daily.   furosemide  40 MG tablet Commonly known as: Lasix  Take 1 tablet (40 mg total) by mouth daily.   lactulose  10 GM/15ML solution Commonly known as: CHRONULAC  Take 45 mLs (30 g total) by mouth 3 (three) times daily.   midodrine  10 MG tablet Commonly known as: PROAMATINE  Take 3 tablets (30 mg total) by mouth 3 (three) times daily with meals.   multivitamin with minerals Tabs tablet Take 1 tablet by mouth daily.   ondansetron  4 MG disintegrating tablet Commonly known as: ZOFRAN -ODT Take 4 mg by mouth 2 (two) times daily as needed for nausea or vomiting. For 14 days   oxycodone  5 MG capsule Commonly known as: OXY-IR Take 5 mg by mouth at bedtime.   oxyCODONE  5  MG immediate release tablet Commonly known as: Oxy IR/ROXICODONE  Take 1 tablet (5 mg total) by mouth every 4 (four) hours as needed for severe pain (pain score 7-10) or breakthrough pain.   pantoprazole  40 MG tablet Commonly known as: PROTONIX  Take 1 tablet (40 mg total) by mouth 2 (two) times daily before a meal.   rifaximin  550 MG Tabs tablet Commonly known as: XIFAXAN  Take 1 tablet (550 mg total) by mouth 2 (two) times daily.   spironolactone  100 MG tablet Commonly known as: Aldactone  Take 1 tablet (100 mg total) by mouth daily.   tamsulosin  0.4 MG Caps capsule Commonly known as: FLOMAX  Take 1 capsule (0.4 mg total) by mouth daily after supper.   traZODone  50 MG tablet Commonly known as: DESYREL  Take 25 mg by mouth at bedtime.        Discharge Instructions: Please refer to Patient Instructions section of EMR for full details.  Patient was counseled important signs and symptoms that should prompt return to medical care, changes in medications, dietary instructions, activity restrictions, and follow up appointments.   Follow-Up Appointments:   Lorrane Pac, MD 01/29/2024, 2:58 PM PGY-1, [facility]  Health Family Medicine

## 2024-01-29 NOTE — Hospital Course (Addendum)
 James Downs is a 58 y.o.male with a history of NAFLD cirrhosis, hx of SBP and hepatic encephalopathy, T2DM, depression, HLD who was admitted to the family medicine teaching Service at [facility] for abdominal pain and nephrolithiasis. His hospital course is detailed below:  Abdominal Pain  Nephrolithiasis  NAFLD w/ Cirrhosis  R Pleural Eff Pt presented from SNF w several days of abdominal pain. VSS and afebrile, no AMS. Imaging revealed large R pleural eff and BL nonobstructing ureteral kidney stones. IR consulted, performed thoracentesis and fluid studies c/w transudative effusion, likely in setting of cirrhosis.  Ascites fluid was too minimal to safely tap. Pt was started on flomax  for nephrolithiasis. Pt remained stable after pleuracentesis. Pt discharged back to SNF.   R Inguinal Hernia Incindentally noted on imaging, Surgery evaled and able to reduce without surgery.  Pain resolved after hernia reduction.  Other chronic conditions were medically managed with home medications and formulary alternatives as necessary (chronic pain, hypotension, MDD)  PCP Follow-up Recommendations: Started on flomax  for 5mm R uretal stone. Monitor for passing of stone and resolution of symptoms.  Can DC Flomax  after 2 stones have passed F/u Urine culture, consider treating for UTI if positive and still symptomatic.  Started spironolactone /Lasix  on discharge, monitor blood pressures and kidney function.

## 2024-01-29 NOTE — Assessment & Plan Note (Deleted)
 Persistently elevated Cr stable around 1.2 after previous admission for hepatorenal syndrome. -Continue to monitor with diuresis as above with CMP

## 2024-01-29 NOTE — TOC Initial Note (Signed)
 Transition of Care [facility]) - Initial/Assessment Note    Patient Details  Name: James Downs MRN: [id number] Date of Birth: 04-05-1965  Transition of Care [facility]) CM/SW Contact:    Sherline Clack, LCSWA Phone Number: 01/29/2024, 4:32 PM  Clinical Narrative:                  CSW reached out to [facility] regarding patient return to facility. Patient is able to return when medially ready. Anticipated discharge today, 12/5. Patient will be transported back to the facility by his son, who can be reached at [phone], after 6 pm. Nurse made aware. CSW provided number for report to nurse [phone], room 114). CSW to log off at this time as patient no longer has TOC needs.   Expected Discharge Plan: Skilled Nursing Facility Barriers to Discharge: No Barriers Identified   Patient Goals and CMS Choice Patient states their goals for this hospitalization and ongoing recovery are:: Return to SNF          Expected Discharge Plan and Services       Living arrangements for the past 2 months: Skilled Nursing Facility Expected Discharge Date: 01/29/24                                    Prior Living Arrangements/Services Living arrangements for the past 2 months: Skilled Nursing Facility Lives with:: Facility Resident                   Activities of Daily Living   ADL Screening (condition at time of admission) Independently performs ADLs?: No Does the patient have a NEW difficulty with bathing/dressing/toileting/self-feeding that is expected to last >3 days?: No Does the patient have a NEW difficulty with getting in/out of bed, walking, or climbing stairs that is expected to last >3 days?: No Does the patient have a NEW difficulty with communication that is expected to last >3 days?: No Is the patient deaf or have difficulty hearing?: No Does the patient have difficulty seeing, even when wearing glasses/contacts?: Yes Does the patient  have difficulty concentrating, remembering, or making decisions?: No  Permission Sought/Granted                  Emotional Assessment Appearance:: Appears stated age Attitude/Demeanor/Rapport: Unable to Assess Affect (typically observed): Unable to Assess Orientation: : Oriented to Self, Oriented to Place, Oriented to  Time, Oriented to Situation      Admission diagnosis:  Lactic acidosis [E87.20] Right inguinal hernia [K40.90] Other ascites [R18.8] Pleural effusion on right [J90] Cirrhosis of liver with ascites (HCC) [K74.60, R18.8] Patient Active Problem List   Diagnosis Date Noted   Pleural effusion, right 01/29/2024   Right inguinal hernia 01/29/2024   CKD (chronic kidney disease), stage II 01/29/2024   Ureteral stone 01/29/2024   Kidney stones 01/29/2024   Frailty 01/08/2024   Fistula between jejunum and skin 01/07/2024   Decompensated cirrhosis (HCC) 01/06/2024   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   Ascites 01/06/2024   Elevated lactic acid level 01/06/2024   Difficulty sleeping 12/10/2023   Chronic health problem 12/08/2023   Malnutrition of moderate degree 12/08/2023   Hepatic encephalopathy (HCC) 12/05/2023   Goals of care, counseling/discussion 12/05/2023   Palliative care by specialist 12/05/2023   AKI (acute kidney injury) 11/28/2023   Depression 11/25/2023   SBP (spontaneous bacterial peritonitis) (HCC) 11/24/2023   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  T2DM (type 2 diabetes mellitus) (HCC) 11/23/2023   Cirrhosis of liver with ascites (HCC) 11/23/2023   Hepatorenal syndrome (HCC) 11/23/2023   Black stool 11/23/2023   Esophageal varices in cirrhosis (HCC) 11/23/2023   Anasarca 05/19/2023   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   Weakness of left leg 11/04/2013   Paresthesias 11/04/2013   Lumbar radiculopathy 11/04/2013   Degenerative disc disease, lumbar 11/04/2013   ONYCHOMYCOSIS 12/23/2006   HYPERLIPIDEMIA  12/23/2006   ANXIETY DISORDER, GENERALIZED 12/23/2006   TOBACCO ABUSE 12/23/2006   SYNDROME, CARPAL TUNNEL 12/23/2006   HYPERTENSION 12/23/2006   NEPHROLITHIASIS, HX OF 12/23/2006   PCP:  Patient, No Pcp Per Pharmacy:   [facility] - [place], [place] - [address] [place] [address] Phone: [phone] Fax: [phone]  [facility] [address] [place] [address] Phone: [phone] Fax: [phone]     Social Drivers of Health (SDOH) Social History: SDOH Screenings   Food Insecurity: No Food Insecurity (01/29/2024)  Recent Concern: Food Insecurity - Food Insecurity Present (01/06/2024)  Housing: Low Risk  (01/29/2024)  Recent Concern: Housing - High Risk (01/06/2024)  Transportation Needs: No Transportation Needs (01/29/2024)  Utilities: Not At Risk (01/29/2024)  Social Connections: Moderately Integrated (01/29/2024)  Tobacco Use: High Risk (01/29/2024)   SDOH Interventions:     Readmission Risk Interventions     No data to display
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
## 2024-01-29 NOTE — Discharge Instructions (Signed)
 Dear James Downs,  Thank you for letting us  participate in your care. You were hospitalized for worsening abdominal pain and diagnosed with Cirrhosis of liver with ascites (HCC). You were treated with thoracentesis or removal of fluid from your lungs and diuretics.   POST-HOSPITAL & CARE INSTRUCTIONS Ensure you take all of your medications as prescribed Go to your follow up appointments (listed below)   DOCTOR'S APPOINTMENT   No future appointments.   Take care and be well!  Family Medicine Teaching Service Inpatient Team [place]  [facility]  [address] [place], [place] [address] [phone]

## 2024-01-29 NOTE — Procedures (Signed)
 PROCEDURE SUMMARY:  Successful US  guided right thoracentesis. Yielded 2.0 liters of yellow fluid. Pt tolerated procedure well. No immediate complications.  Limited US  Abdomen shows very minimal ascites insufficient for paracentesis. Images saved in patient's chart.   Specimen was sent for labs. CXR ordered.  EBL < 5 mL  Solmon Selmer Ku PA-C 01/29/2024 9:50 AM

## 2024-01-29 NOTE — Assessment & Plan Note (Deleted)
 Patient presents from rehab facility w/ abdominal pain and ascites. Aox4 w/ VSS, benign abd exam. Appears consistent w/ his regular cirrhosis related ascites, due for interval paracentesis.  Lactic acid mildly elevated 2.2, with mildly elevated ammonia.  Lower concern for pancreatitis given normal lipase. -IR consult ordered for paracentesis this morning -Paracentesis PMNs, WBCs, will need co-administration of albumin  if large volume -Continue home lactulose , rifaximin , and ciprofloxacin   -Continue 100 mg spironolactone  daily and monitor kidney function closely -AM CBC, CMP

## 2024-01-29 NOTE — Assessment & Plan Note (Deleted)
 Chronic pain: Tylenol  650mg  q8 hours PRN, oxycodone  5mg  q4 hours PRN + once at bedtime  Hypotension: continue home midodrine  MDD: continue home lexapro 

## 2024-01-29 NOTE — Assessment & Plan Note (Signed)
 Persistently elevated Cr after previous admission for hepatorenal syndrome. -Continue to monitor with diuresis as above with CMP

## 2024-01-29 NOTE — Plan of Care (Signed)

## 2024-01-29 NOTE — Assessment & Plan Note (Signed)
 Chronic pain: Tylenol  650mg  q8 hours PRN, oxycodone  5mg  q4 hours PRN + once at bedtime  Hypotension: continue home midodrine  MDD: continue home lexapro 
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
## 2024-01-29 NOTE — Assessment & Plan Note (Addendum)
 Large pleural effusion in R lung on CXR, stable vs prior, likely 2/2 anasarca. Patient w/ normal wob on room air w/o dyspnea.  -Diuresis as above, consider continuing if good response -Consider chest tube placement if worsening

## 2024-01-29 NOTE — Assessment & Plan Note (Deleted)
 Large pleural effusion in R lung on CXR, stable vs prior, likely 2/2 anasarca. *** - Due to size, IR consulted for thoracentesis and plan for thoracentesis today - Continue diuretics

## 2024-01-29 NOTE — Assessment & Plan Note (Addendum)
 Patient presents from rehab facility w/ abdominal pain and ascites. Aox4 w/ VSS, benign abd exam. Appears consistent w/ his regular cirrhosis related ascites, due for interval paracentesis and unfortunately no physician in ED able to perform overnight, requiring admission for paracentesis with IR in the AM. AST mildly elevated from prior, likely 2/2 congestion, along with lactic acid. Ammonia normal at 23. -Admit to [facility], attending Dr McDiarmid -IR consult ordered by ED for paracentesis in AM, will need co-administration of albumin  if large volume -Continue home lactulose , rifaximin , and ciprofloxacin   -Administer 100 mg spironolactone  and 40 mg lasix  PO once, consider continuing at discharge (appears to have been paused at last DC due to BP concerns and hepatorenal syndrome, both of which have improved) -AM CMP

## 2024-01-30 LAB — URINE CULTURE: Culture: NO GROWTH

## 2024-02-01 DIAGNOSIS — I9589 Other hypotension: Secondary | ICD-10-CM | POA: Diagnosis not present

## 2024-02-01 DIAGNOSIS — I8511 Secondary esophageal varices with bleeding: Secondary | ICD-10-CM | POA: Diagnosis not present

## 2024-02-01 DIAGNOSIS — K7682 Hepatic encephalopathy: Secondary | ICD-10-CM | POA: Diagnosis not present

## 2024-02-01 DIAGNOSIS — K7210 Chronic hepatic failure without coma: Secondary | ICD-10-CM | POA: Diagnosis not present

## 2024-02-01 LAB — BODY FLUID CULTURE W GRAM STAIN: Culture: NO GROWTH

## 2024-02-03 LAB — CHOLESTEROL, BODY FLUID: Cholesterol, Fluid: 29 mg/dL

## 2024-02-05 DIAGNOSIS — E440 Moderate protein-calorie malnutrition: Secondary | ICD-10-CM | POA: Diagnosis not present

## 2024-02-05 DIAGNOSIS — E119 Type 2 diabetes mellitus without complications: Secondary | ICD-10-CM | POA: Diagnosis not present

## 2024-02-05 DIAGNOSIS — K7460 Unspecified cirrhosis of liver: Secondary | ICD-10-CM | POA: Diagnosis not present

## 2024-02-05 DIAGNOSIS — L89152 Pressure ulcer of sacral region, stage 2: Secondary | ICD-10-CM | POA: Diagnosis not present

## 2024-02-10 DIAGNOSIS — I9589 Other hypotension: Secondary | ICD-10-CM | POA: Diagnosis not present

## 2024-02-10 DIAGNOSIS — K7210 Chronic hepatic failure without coma: Secondary | ICD-10-CM | POA: Diagnosis not present

## 2024-02-12 DIAGNOSIS — I9589 Other hypotension: Secondary | ICD-10-CM | POA: Diagnosis not present

## 2024-02-12 DIAGNOSIS — G4730 Sleep apnea, unspecified: Secondary | ICD-10-CM | POA: Diagnosis not present

## 2024-02-12 DIAGNOSIS — K7210 Chronic hepatic failure without coma: Secondary | ICD-10-CM | POA: Diagnosis not present

## 2024-02-12 DIAGNOSIS — R051 Acute cough: Secondary | ICD-10-CM | POA: Diagnosis not present

## 2024-02-15 DIAGNOSIS — J180 Bronchopneumonia, unspecified organism: Secondary | ICD-10-CM | POA: Diagnosis not present

## 2024-02-15 DIAGNOSIS — N200 Calculus of kidney: Secondary | ICD-10-CM | POA: Diagnosis not present

## 2024-02-15 DIAGNOSIS — R7989 Other specified abnormal findings of blood chemistry: Secondary | ICD-10-CM | POA: Diagnosis not present

## 2024-02-22 DIAGNOSIS — I9589 Other hypotension: Secondary | ICD-10-CM | POA: Diagnosis not present

## 2024-02-22 DIAGNOSIS — G894 Chronic pain syndrome: Secondary | ICD-10-CM | POA: Diagnosis not present

## 2024-02-22 DIAGNOSIS — K7210 Chronic hepatic failure without coma: Secondary | ICD-10-CM | POA: Diagnosis not present

## 2024-03-14 ENCOUNTER — Telehealth: Payer: Self-pay

## 2024-03-14 NOTE — Telephone Encounter (Signed)
 Copied from CRM [id number]. Topic: Clinical - Medication Question >> Mar 11, 2024  4:56 PM Charolett L wrote: Reason for CRM: Patient is calling in because he's needing a medication refill for Xifaxan  which is for liver disease and helps him not be delusional. Patients daughter would like a call back to try and get a solution or alternative  [phone]

## 2024-04-08 ENCOUNTER — Ambulatory Visit: Payer: Self-pay | Admitting: Nurse Practitioner
